# Patient Record
Sex: Female | Born: 1940 | Race: White | Hispanic: No | Smoking: Former smoker
Health system: Southern US, Community
[De-identification: ages and names within clinical notes are randomized; demographics above are authoritative.]

## PROBLEM LIST (undated history)

## (undated) DIAGNOSIS — Z860100 Personal history of colon polyps, unspecified: Secondary | ICD-10-CM

## (undated) DIAGNOSIS — N200 Calculus of kidney: Secondary | ICD-10-CM

## (undated) DIAGNOSIS — F329 Major depressive disorder, single episode, unspecified: Secondary | ICD-10-CM

## (undated) DIAGNOSIS — F32A Depression, unspecified: Secondary | ICD-10-CM

## (undated) DIAGNOSIS — G10 Huntington's disease: Secondary | ICD-10-CM

## (undated) DIAGNOSIS — M810 Age-related osteoporosis without current pathological fracture: Secondary | ICD-10-CM

## (undated) DIAGNOSIS — C50919 Malignant neoplasm of unspecified site of unspecified female breast: Secondary | ICD-10-CM

## (undated) DIAGNOSIS — Z8619 Personal history of other infectious and parasitic diseases: Secondary | ICD-10-CM

## (undated) HISTORY — DX: Personal history of colonic polyps: Z86.010

## (undated) HISTORY — DX: Calculus of kidney: N20.0

## (undated) HISTORY — DX: Huntington's disease: G10

## (undated) HISTORY — DX: Depression, unspecified: F32.A

## (undated) HISTORY — DX: Age-related osteoporosis without current pathological fracture: M81.0

## (undated) HISTORY — DX: Personal history of colon polyps, unspecified: Z86.0100

## (undated) HISTORY — DX: Malignant neoplasm of unspecified site of unspecified female breast: C50.919

## (undated) HISTORY — DX: Major depressive disorder, single episode, unspecified: F32.9

## (undated) HISTORY — DX: Personal history of other infectious and parasitic diseases: Z86.19

## (undated) HISTORY — PX: CHOLECYSTECTOMY: SHX55

---

## 2007-11-02 DIAGNOSIS — C50919 Malignant neoplasm of unspecified site of unspecified female breast: Secondary | ICD-10-CM

## 2007-11-02 HISTORY — DX: Malignant neoplasm of unspecified site of unspecified female breast: C50.919

## 2007-11-02 HISTORY — PX: TOTAL MASTECTOMY: SHX6129

## 2014-06-24 ENCOUNTER — Encounter: Payer: Self-pay | Admitting: Family Medicine

## 2018-07-10 LAB — HM DEXA SCAN

## 2018-11-27 LAB — LIPID PANEL
Cholesterol: 191 (ref 0–200)
HDL: 79 — AB (ref 35–70)
LDL Cholesterol: 108
LDl/HDL Ratio: 2.4
Triglycerides: 134 (ref 40–160)

## 2018-12-26 LAB — MICROALBUMIN, URINE: Microalb, Ur: 90.8

## 2019-03-05 ENCOUNTER — Encounter: Payer: Self-pay | Admitting: Family Medicine

## 2019-03-05 ENCOUNTER — Ambulatory Visit: Payer: Medicare Other

## 2019-03-05 ENCOUNTER — Ambulatory Visit (INDEPENDENT_AMBULATORY_CARE_PROVIDER_SITE_OTHER): Payer: Medicare Other | Admitting: Family Medicine

## 2019-03-05 ENCOUNTER — Ambulatory Visit: Payer: Self-pay | Admitting: Family Medicine

## 2019-03-05 VITALS — BP 118/90 | HR 71 | Temp 98.5°F | Ht 67.0 in | Wt 125.0 lb

## 2019-03-05 DIAGNOSIS — Z853 Personal history of malignant neoplasm of breast: Secondary | ICD-10-CM | POA: Insufficient documentation

## 2019-03-05 DIAGNOSIS — Z720 Tobacco use: Secondary | ICD-10-CM

## 2019-03-05 DIAGNOSIS — G10 Huntington's disease: Secondary | ICD-10-CM

## 2019-03-05 DIAGNOSIS — F325 Major depressive disorder, single episode, in full remission: Secondary | ICD-10-CM

## 2019-03-05 DIAGNOSIS — Z9013 Acquired absence of bilateral breasts and nipples: Secondary | ICD-10-CM | POA: Insufficient documentation

## 2019-03-05 DIAGNOSIS — I10 Essential (primary) hypertension: Secondary | ICD-10-CM | POA: Insufficient documentation

## 2019-03-05 MED ORDER — ESCITALOPRAM OXALATE 20 MG PO TABS: 20.0000 mg | ORAL_TABLET | Freq: Every day | ORAL | 3 refills | Status: DC

## 2019-03-05 NOTE — Assessment & Plan Note (Addendum)
Not interested in quitting today. Is interested in screening for lung cancer, was last done ~6 months ago

## 2019-03-05 NOTE — Assessment & Plan Note (Signed)
Stable per patient, though with some increase in anxiety symptoms. Has tried chair yoga and mindfulness in the past, will try doing this now. Declined therapy

## 2019-03-05 NOTE — Progress Notes (Signed)
I connected with [name] on 03/05/19 at  2:40 PM EDT by video and verified that I am speaking with the correct person using two identifiers.   I discussed the limitations, risks, security and privacy concerns of performing an evaluation and management service by video and the availability of in person appointments. I also discussed with the patient that there may be a patient responsible charge related to this service. The patient expressed understanding and agreed to proceed.  Patient location: [facility] Provider Location: [place] Participants: [name] and [name]   Subjective:     Maryse Brierley is a 78 y.o. female presenting for Establish Care (previous PCP Bindu Matthew-in [place]); Depression (needs Lexapro refilled); and 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previous neurologist in [place] was [employer])     HPI   #Depression - feels stable on the lexapro - some more anxiety since moving to [place] - current smoker - has never done counseling -   #Huntington's disease - saw a neurologist in [place] - trying to get an appoint in [facility] - some difficulty walking   #Breast Cancer - 2009 - screens once a year with breast US - due to b/l mastectomy  #Tobacco use - has quit in the past for up to 1 year - not interested in quitting now - has done lung cancer screening with low dose CT scan - would like to continue screening  Review of Systems  Constitutional: Negative for chills and fever.  Musculoskeletal: Positive for gait problem.  Neurological: Negative for weakness.  Psychiatric/Behavioral: The patient is nervous/anxious.      Social History   Tobacco Use  Smoking Status Current Every Day Smoker  . Packs/day: 0.75  . Years: 50.00  . Pack years: 37.50  . Types: Cigarettes  Smokeless Tobacco Never Used        Objective:   BP Readings from Last 3 Encounters:  03/05/19 118/90   Wt Readings from Last 3 Encounters:   03/05/19 125 lb (56.7 kg)   BP 118/90   Pulse 71   Temp 98.5 F (36.9 C)   Ht 5\' 7"  (1.702 m) Comment: per patient  Wt 125 lb (56.7 kg) Comment: per patient  SpO2 95%   BMI 19.58 kg/m    Physical Exam Constitutional:      Appearance: Normal appearance. She is not ill-appearing.  HENT:     Head: Normocephalic and atraumatic.     Right Ear: External ear normal.     Left Ear: External ear normal.     Nose: Nose normal.  Eyes:     Conjunctiva/sclera: Conjunctivae normal.  Cardiovascular:     Rate and Rhythm: Normal rate.  Pulmonary:     Effort: Pulmonary effort is normal. No respiratory distress.  Neurological:     Mental Status: She is alert.     Comments: Occasional involuntary lip movement  Psychiatric:        Mood and Affect: Mood normal.        Behavior: Behavior normal.        Thought Content: Thought content normal.        Judgment: Judgment normal.           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 Referral to neurology. Walking impacted. Has medication and overall feels she is  doing well.       Relevant Orders   Ambulatory referral to Neurology     Other   Tobacco use    Not interested in quitting today. Is interested in screening for lung cancer, was last done ~6 months ago      Depression, major, single episode, complete remission ([place])    Stable per patient, though with some increase in anxiety symptoms. Has tried chair yoga and mindfulness in the past, will try doing this now. Declined therapy      Relevant Medications   escitalopram (LEXAPRO) 20 MG tablet       Return in about 6 months (around 09/05/2019) for annual.  [name], MD

## 2019-03-05 NOTE — Assessment & Plan Note (Signed)
Late onset per patient. Referral to neurology. Walking impacted. Has medication and overall feels she is doing well.

## 2019-03-19 LAB — CBC AND DIFFERENTIAL
HCT: 43 (ref 36–46)
Hemoglobin: 14 (ref 12.0–16.0)
Platelets: 207 (ref 150–399)
WBC: 5.6

## 2019-03-19 LAB — BASIC METABOLIC PANEL
BUN: 20 (ref 4–21)
Creatinine: 1 (ref 0.5–1.1)
Glucose: 168
Potassium: 4.1 (ref 3.4–5.3)
Sodium: 141 (ref 137–147)

## 2019-03-19 LAB — HEPATIC FUNCTION PANEL
ALT: 9 (ref 7–35)
AST: 13 (ref 13–35)
Alkaline Phosphatase: 93 (ref 25–125)
Bilirubin, Total: 0.8

## 2019-03-19 LAB — VITAMIN B1: Vitamin B1 (Thiamine): 144.9

## 2019-03-19 LAB — VITAMIN B12: Vitamin B-12: 235

## 2019-03-27 ENCOUNTER — Other Ambulatory Visit: Payer: Self-pay

## 2019-03-27 MED ORDER — VITAMIN D (ERGOCALCIFEROL) 1.25 MG (50000 UNIT) PO CAPS: 50000.0000 [IU] | ORAL_CAPSULE | ORAL | 3 refills | Status: DC

## 2019-03-27 NOTE — Telephone Encounter (Signed)
Refill request came in from pharmacy on behalf of patient for Vitamin D RX. Dr. Einar Pheasant has not filled this medication before yet.

## 2019-04-02 ENCOUNTER — Encounter: Payer: Self-pay | Admitting: Family Medicine

## 2019-04-02 DIAGNOSIS — I712 Thoracic aortic aneurysm, without rupture, unspecified: Secondary | ICD-10-CM | POA: Insufficient documentation

## 2019-04-02 DIAGNOSIS — G9332 Myalgic encephalomyelitis/chronic fatigue syndrome: Secondary | ICD-10-CM | POA: Insufficient documentation

## 2019-04-02 DIAGNOSIS — R3129 Other microscopic hematuria: Secondary | ICD-10-CM | POA: Insufficient documentation

## 2019-04-02 DIAGNOSIS — E538 Deficiency of other specified B group vitamins: Secondary | ICD-10-CM | POA: Insufficient documentation

## 2019-04-02 DIAGNOSIS — R5382 Chronic fatigue, unspecified: Secondary | ICD-10-CM | POA: Insufficient documentation

## 2019-04-02 DIAGNOSIS — M810 Age-related osteoporosis without current pathological fracture: Secondary | ICD-10-CM | POA: Insufficient documentation

## 2019-06-11 ENCOUNTER — Encounter: Payer: Self-pay | Admitting: Internal Medicine

## 2019-06-11 ENCOUNTER — Other Ambulatory Visit: Payer: Self-pay

## 2019-06-11 ENCOUNTER — Ambulatory Visit (INDEPENDENT_AMBULATORY_CARE_PROVIDER_SITE_OTHER): Payer: Medicare Other | Admitting: Internal Medicine

## 2019-06-11 VITALS — BP 120/86 | HR 77 | Temp 98.2°F | Ht 67.0 in | Wt 116.0 lb

## 2019-06-11 DIAGNOSIS — R3129 Other microscopic hematuria: Secondary | ICD-10-CM

## 2019-06-11 DIAGNOSIS — N200 Calculus of kidney: Secondary | ICD-10-CM

## 2019-06-11 LAB — POC URINALSYSI DIPSTICK (AUTOMATED)
Bilirubin, UA: NEGATIVE
Glucose, UA: NEGATIVE
Ketones, UA: NEGATIVE
Leukocytes, UA: NEGATIVE
Nitrite, UA: NEGATIVE
Protein, UA: NEGATIVE
Spec Grav, UA: >=1.03 — AB (ref 1.010–1.025)
Urobilinogen, UA: 0.2 E.U./dL
pH, UA: 5 (ref 5.0–8.0)

## 2019-06-11 NOTE — Progress Notes (Signed)
Subjective:    Patient ID: Barbara Krause, female    DOB: 1941/08/08, 78 y.o.   MRN: [id number]  HPI  Pt presents to the clinic today requesting referral to urology. She reports she has a CT scan from 12/2018 that shows kidney stones. She does not know where the kidney stone is. She thinks she may have passed 1 kidney stone many years ago. She denies flank or abdominal pain, dysuria, blood in her urine or incomplete bladder emptying. She never had this further evaluated.   Review of Systems  Past Medical History:  Diagnosis Date  . Breast cancer ([place])   . Depression   . History of chicken pox   . History of colon polyps   . 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[place])   . Osteoporosis     Current Outpatient Medications  Medication Sig Dispense Refill  . AUSTEDO 12 MG TABS Take 1 tablet by mouth 2 (two) times a day.    . donepezil (ARICEPT) 5 MG tablet Take 1 tablet by mouth daily.    Marland Kitchen escitalopram (LEXAPRO) 20 MG tablet Take 1 tablet (20 mg total) by mouth daily. 90 tablet 3  . Vitamin D, Ergocalciferol, (DRISDOL) 1.25 MG (50000 UT) CAPS capsule Take 1 capsule (50,000 Units total) by mouth once a week. 4 capsule 3   No current facility-administered medications for this visit.     Allergies  Allergen Reactions  . Penicillin G Hives    Family History  Problem Relation Age of Onset  . Colon cancer Mother   . Stroke Father   . Heart disease Father   . Ovarian cancer Sister   . Healthy Daughter   . Stomach cancer Maternal Grandmother   . Diabetes Maternal Grandfather   . Colon cancer Paternal Grandmother   . Diabetes Paternal Grandfather   . Healthy Daughter     Social History   Socioeconomic History  . Marital status: Married    Spouse name: Fritz Pickerel  . Number of children: 2  . Years of education: Master's Degree in SW  . Highest education level: Not on file  Occupational History  . Not on file  Social Needs  . Financial resource strain: Not hard at all  . Food insecurity     Worry: Not on file    Inability: Not on file  . Transportation needs    Medical: Not on file    Non-medical: Not on file  Tobacco Use  . Smoking status: Current Every Day Smoker    Packs/day: 0.75    Years: 50.00    Pack years: 37.50    Types: Cigarettes  . Smokeless tobacco: Never Used  Substance and Sexual Activity  . Alcohol use: Yes    Comment: about 1 glass of wine x 2 a week  . Drug use: Never  . Sexual activity: Not Currently  Lifestyle  . Physical activity    Days per week: Not on file    Minutes per session: Not on file  . Stress: Not on file  Relationships  . Social Herbalist on phone: Not on file    Gets together: Not on file    Attends religious service: Not on file    Active member of club or organization: Not on file    Attends meetings of clubs or organizations: Not on file    Relationship status: Not on file  . Intimate partner violence    Fear of current or ex partner: Not on file  Emotionally abused: Not on file    Physically abused: Not on file    Forced sexual activity: Not on file  Other Topics Concern  . 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 from [place] to [place] to be near family   Enjoys: smoking, going to the movies and out to eat, seeing theater   Exercise: not currently   Diet: eats breakfast and dinner, but not much during the day     Constitutional: Denies fever, malaise, fatigue, headache or abrupt weight changes.  Respiratory: Denies difficulty breathing, shortness of breath, cough or sputum production.   Cardiovascular: Denies chest pain, chest tightness, palpitations or swelling in the hands or feet.  Gastrointestinal: Denies abdominal pain, bloating, constipation, diarrhea or blood in the stool.  GU: Pt reports microscopic blood in her urine. Denies urgency, frequency, pain with urination, burning sensation, blood in urine, odor or discharge.  No  other specific complaints in a complete review of systems (except as listed in HPI above).     Objective:   Physical Exam  BP 120/86   Pulse 77   Temp 98.2 F (36.8 C) (Temporal)   Ht 5\' 7"  (1.702 m)   Wt 116 lb (52.6 kg)   SpO2 97%   BMI 18.17 kg/m  Wt Readings from Last 3 Encounters:  06/11/19 116 lb (52.6 kg)  03/05/19 125 lb (56.7 kg)    General: Appears her stated age, well developed, well nourished in NAD. Abdomen: Soft and nontender. Normal bowel sounds. No distention or masses noted. Kidneys non palpable. No CVA tenderness.  Neurological: Alert and oriented.   BMET    Component Value Date/Time   NA 141 03/19/2019   K 4.1 03/19/2019   BUN 20 03/19/2019   CREATININE 1.0 03/19/2019    Lipid Panel     Component Value Date/Time   CHOL 191 11/27/2018   TRIG 134 11/27/2018   HDL 79 (A) 11/27/2018   LDLCALC 108 11/27/2018    CBC    Component Value Date/Time   WBC 5.6 03/19/2019   HGB 14.0 03/19/2019   HCT 43 03/19/2019   PLT 207 03/19/2019    Hgb A1C No results found for: HGBA1C          Assessment & Plan:   Kidney Stones:  Unable to review CT as this was on disc Advised her if this was just asymptomatic stones, they may not intervene but monitor instead Urinalysis: trace microscopic hematuria Referral to urology placed Encouraged adequate water intake  Return precautions discussed

## 2019-06-11 NOTE — Patient Instructions (Signed)

## 2019-06-19 ENCOUNTER — Other Ambulatory Visit: Payer: Self-pay

## 2019-06-19 NOTE — Telephone Encounter (Signed)
Sent to Dr. Shah

## 2019-06-19 NOTE — Telephone Encounter (Signed)
Dr Einar Pheasant has not refilled this medication before. OK to refill?

## 2019-06-19 NOTE — Telephone Encounter (Signed)
It appears that she has established care with [facility] neurology, can we send the refill request to their office?

## 2019-07-04 ENCOUNTER — Encounter: Payer: Self-pay | Admitting: Urology

## 2019-07-04 ENCOUNTER — Ambulatory Visit
Admission: RE | Admit: 2019-07-04 | Discharge: 2019-07-04 | Disposition: A | Discharge status: Home / Self Care | Payer: Medicare Other | Source: Ambulatory Visit | Attending: Urology | Admitting: Urology

## 2019-07-04 ENCOUNTER — Other Ambulatory Visit: Payer: Self-pay

## 2019-07-04 ENCOUNTER — Ambulatory Visit (INDEPENDENT_AMBULATORY_CARE_PROVIDER_SITE_OTHER): Payer: Medicare Other | Admitting: Urology

## 2019-07-04 VITALS — BP 164/95 | HR 76 | Ht 67.0 in | Wt 117.0 lb

## 2019-07-04 DIAGNOSIS — N200 Calculus of kidney: Secondary | ICD-10-CM | POA: Diagnosis present

## 2019-07-04 DIAGNOSIS — R3129 Other microscopic hematuria: Secondary | ICD-10-CM | POA: Diagnosis not present

## 2019-07-04 DIAGNOSIS — F17200 Nicotine dependence, unspecified, uncomplicated: Secondary | ICD-10-CM | POA: Diagnosis not present

## 2019-07-04 LAB — URINALYSIS, COMPLETE
Bilirubin, UA: NEGATIVE
Glucose, UA: NEGATIVE
Ketones, UA: NEGATIVE
Leukocytes,UA: NEGATIVE
Nitrite, UA: NEGATIVE
Protein,UA: NEGATIVE
RBC, UA: NEGATIVE
Specific Gravity, UA: 1.025 (ref 1.005–1.030)
Urobilinogen, Ur: 0.2 mg/dL (ref 0.2–1.0)
pH, UA: 5 (ref 5.0–7.5)

## 2019-07-04 LAB — MICROSCOPIC EXAMINATION
Bacteria, UA: NONE SEEN
RBC, Urine: NONE SEEN /hpf (ref 0–2)

## 2019-07-04 NOTE — Progress Notes (Signed)
07/04/2019 3:39 PM   Barbara Krause [birth year] [id number]  Referring provider: [name], MD [place],  [place] [address]  Chief Complaint  Patient presents with  . Nephrolithiasis    New Patient    HPI: 78 year old female referred for further evaluation of hematuria and kidney stones.  She was previously seen and evaluated at [facility] in March by the urologist, Frederich Balding.  She started a work-up for microscopic hematuria at that time but however secondary to [place] and moving to [place], she never completed the work-up.  She does have a significant smoking history, smokes 1 pack a day for 45 years and continues to smoke.  She did undergo CT urogram in [place] in 12/2018.  She brings a disc with her today which I was able to review.  There is no radiology report associated with this however upon my interpretation, she has a right lower pole renal cyst which is simple in nature.  She is a large 1.5 cm nonobstructing lower pole stone without hydroureteronephrosis.  On delayed imaging, she has no filling defects.  Her ureters are incompletely opacified as well as her bladder.  No other pathology is identified on my interpretation.  She denies a personal history of kidney stones.  No history of flank pain.  No UTIs.  She does have multiple medical comorbidities including a personal history of breast cancer as well as recent diagnosis of Huntington's.  She has gait instability related to this.   PMH: Past Medical History:  Diagnosis Date  . Breast cancer ([place])   . Depression   . History of chicken pox   . History of colon polyps   . 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[place])   . Osteoporosis     Surgical History: Past Surgical History:  Procedure Laterality Date  . [place]  . CHOLECYSTECTOMY    . TOTAL MASTECTOMY Bilateral 2009    Home Medications:  Allergies as of 07/04/2019      Reactions   Penicillin G Hives       Medication List       Accurate as of July 04, 2019  3:39 PM. If you have any questions, ask your nurse or doctor.        Austedo 12 MG Tabs Generic drug: Deutetrabenazine Take 1 tablet by mouth 2 (two) times a day.   donepezil 5 MG tablet Commonly known as: ARICEPT Take 1 tablet by mouth daily.   escitalopram 20 MG tablet Commonly known as: LEXAPRO Take 1 tablet (20 mg total) by mouth daily.   Vitamin D (Ergocalciferol) 1.25 MG (50000 UT) Caps capsule Commonly known as: DRISDOL Take 1 capsule (50,000 Units total) by mouth once a week.       Allergies:  Allergies  Allergen Reactions  . Penicillin G Hives    Family History: Family History  Problem Relation Age of Onset  . Colon cancer Mother   . Stroke Father   . Heart disease Father   . Ovarian cancer Sister   . Healthy Daughter   . Stomach cancer Maternal Grandmother   . Diabetes Maternal Grandfather   . Colon cancer Paternal Grandmother   . Diabetes Paternal Grandfather   . Healthy Daughter     Social History:  reports that she has been smoking cigarettes. She has a 37.50 pack-year smoking history. She has never used smokeless tobacco. She reports current alcohol use. She reports that she does  not use drugs.  ROS: UROLOGY Frequent Urination?: No Hard to postpone urination?: No Burning/pain with urination?: No Get up at night to urinate?: No Leakage of urine?: No Urine stream starts and stops?: No Trouble starting stream?: No Do you have to strain to urinate?: No Blood in urine?: Yes Urinary tract infection?: No Sexually transmitted disease?: No Injury to kidneys or bladder?: No Painful intercourse?: No Weak stream?: No Currently pregnant?: No Vaginal bleeding?: No Last menstrual period?: N  Gastrointestinal Nausea?: No Vomiting?: No Indigestion/heartburn?: No Diarrhea?: No Constipation?: No  Constitutional Fever: No Night sweats?: No Weight loss?: No Fatigue?: No  Skin Skin  rash/lesions?: No Itching?: No  Eyes Blurred vision?: No Double vision?: No  Ears/Nose/Throat Sore throat?: No Sinus problems?: No  Hematologic/Lymphatic Swollen glands?: No Easy bruising?: No  Cardiovascular Leg swelling?: No Chest pain?: No  Respiratory Cough?: No Shortness of breath?: No  Endocrine Excessive thirst?: No  Musculoskeletal Back pain?: No Joint pain?: No  Neurological Headaches?: No Dizziness?: No  Psychologic Depression?: No Anxiety?: No  Physical Exam: BP (!) 164/95   Pulse 76   Ht 5\' 7"  (1.702 m)   Wt 117 lb (53.1 kg)   BMI 18.32 kg/m   Constitutional:  Alert and oriented, No acute distress. HEENT: [place] AT, moist mucus membranes.  Trachea midline, no masses. Cardiovascular: No clubbing, cyanosis, or edema. Respiratory: Normal respiratory effort, no increased work of breathing. GI: Abdomen is soft, nontender, nondistended, no abdominal masses GU: No CVA tenderness Skin: No rashes, bruises or suspicious lesions. Neurologic: Gait abnormality appreciated Psychiatric: Normal mood and affect.  Laboratory Data: Lab Results  Component Value Date   WBC 5.6 03/19/2019   HGB 14.0 03/19/2019   HCT 43 03/19/2019   PLT 207 03/19/2019    Lab Results  Component Value Date   CREATININE 1.0 03/19/2019    Urinalysis UA today is negative  Pertinent Imaging: CT scan from [place] personally reviewed today on disc, interpretation by me is as above.  Will request records for official radiology read.  Assessment & Plan:    1. Kidney stone Large asymptomatic 1.6 cm right lower pole stone.  Unclear chronicity of the stone without sequela.  I recommend a KUB today for baseline.  We will likely continue to follow this conservatively over time given her lack of symptoms and medical comorbidities.  She is agreeable this plan.   - Urinalysis, Complete - Abdomen 1 view (KUB); Future  2. Microscopic hematuria Given her personal history of  smoking, she is high risk for bladder pathology.  As such, I highly recommended that she undergo office cystoscopy to which she is agreeable.  We will also have her sign a release when she returns to get the official radiologic interpretation of her CT urogram.  Per my interpretation, she has no upper tract filling defects or pathology.  3. Smoker Encourage smoking cessation, not ready to quit.  Risk factor for bladder cancer.  KUB today, schedule cystoscopy.  She will need to sign a records release next time she is here for urogram report.  Hollice Espy, MD  [facility] [address] [place], [place] [address] [phone]

## 2019-07-12 ENCOUNTER — Other Ambulatory Visit: Payer: Self-pay

## 2019-07-12 ENCOUNTER — Ambulatory Visit (INDEPENDENT_AMBULATORY_CARE_PROVIDER_SITE_OTHER): Payer: Medicare Other | Admitting: Physician Assistant

## 2019-07-12 ENCOUNTER — Encounter: Payer: Self-pay | Admitting: Physician Assistant

## 2019-07-12 VITALS — BP 156/96 | HR 69 | Ht 67.0 in

## 2019-07-12 DIAGNOSIS — R300 Dysuria: Secondary | ICD-10-CM | POA: Diagnosis not present

## 2019-07-12 DIAGNOSIS — N952 Postmenopausal atrophic vaginitis: Secondary | ICD-10-CM

## 2019-07-12 NOTE — Progress Notes (Signed)
07/12/2019 2:35 PM   [name] [birth year] [id number]  CC: Burning with urination, slight discharge  HPI: Barbara Krause is a 78 y.o. female who presents today for evaluation of possible UTI. She is an established [facility] patient who last saw Dr. Erlene Quan on 07/04/2019 to establish care for a history of nephrolithiasis and microscopic hematuria.  She reports a 2-day history of vulvar burning with urination and slight vaginal discharge. She describes the vaginal discharge as typical of her normal discharge, but increased in volume. She denies dysuria, frequency, urgency, gross hematuria, vaginal odor, and vaginal or vulvar pruritis.  She does not have a history of recurrent UTI. She does have a personal history of breast cancer, s/p bilateral total mastectomy in 2009. She is not sexually active.  In-office UA today positive for trace-intact blood and 1+ protein; urine microscopy pan-negative.  PMH: Past Medical History:  Diagnosis Date  . Breast cancer ([place])   . Depression   . History of chicken pox   . History of colon polyps   . 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[place])   . Osteoporosis     Surgical History: Past Surgical History:  Procedure Laterality Date  . [place]  . CHOLECYSTECTOMY    . TOTAL MASTECTOMY Bilateral 2009    Home Medications:  Allergies as of 07/12/2019      Reactions   Penicillin G Hives      Medication List       Accurate as of July 12, 2019 11:59 PM. If you have any questions, ask your nurse or doctor.        Austedo 12 MG Tabs Generic drug: Deutetrabenazine Take 1 tablet by mouth 2 (two) times a day.   donepezil 5 MG tablet Commonly known as: ARICEPT Take 1 tablet by mouth daily.   escitalopram 20 MG tablet Commonly known as: LEXAPRO Take 1 tablet (20 mg total) by mouth daily.   Vitamin D (Ergocalciferol) 1.25 MG (50000 UT) Caps capsule Commonly known as: DRISDOL Take 1 capsule (50,000 Units total) by mouth once a  week.       Allergies:  Allergies  Allergen Reactions  . Penicillin G Hives    Family History: Family History  Problem Relation Age of Onset  . Colon cancer Mother   . Stroke Father   . Heart disease Father   . Ovarian cancer Sister   . Healthy Daughter   . Stomach cancer Maternal Grandmother   . Diabetes Maternal Grandfather   . Colon cancer Paternal Grandmother   . Diabetes Paternal Grandfather   . Healthy Daughter     Social History:   reports that she has been smoking cigarettes. She has a 37.50 pack-year smoking history. She has never used smokeless tobacco. She reports current alcohol use. She reports that she does not use drugs.  ROS: UROLOGY Frequent Urination?: No Hard to postpone urination?: No Burning/pain with urination?: No Get up at night to urinate?: No Leakage of urine?: No Urine stream starts and stops?: No Trouble starting stream?: No Do you have to strain to urinate?: No Blood in urine?: No Urinary tract infection?: Yes Sexually transmitted disease?: No Injury to kidneys or bladder?: No Painful intercourse?: No Weak stream?: No Currently pregnant?: No Vaginal bleeding?: No Last menstrual period?: n  Gastrointestinal Nausea?: No Vomiting?: No Indigestion/heartburn?: No Diarrhea?: No Constipation?: No  Constitutional Fever: No Night sweats?: Yes Weight loss?: No Fatigue?: No  Skin Skin rash/lesions?: No Itching?: No  Eyes  Blurred vision?: No Double vision?: No  Ears/Nose/Throat Sore throat?: No Sinus problems?: No  Hematologic/Lymphatic Swollen glands?: No Easy bruising?: No  Cardiovascular Leg swelling?: No Chest pain?: No  Respiratory Cough?: No Shortness of breath?: No  Endocrine Excessive thirst?: No  Musculoskeletal Back pain?: No Joint pain?: No  Neurological Headaches?: No Dizziness?: No  Psychologic Depression?: No Anxiety?: No  Physical Exam: BP (!) 156/96 (BP Location: Left Arm, Patient  Position: Sitting, Cuff Size: Normal)   Pulse 69   Ht 5\' 7"  (1.702 m)   BMI 18.32 kg/m   Constitutional:  Alert and oriented, no acute distress, nontoxic appearing HEENT: [place], AT Cardiovascular: No clubbing, cyanosis, or edema Respiratory: Normal respiratory effort, no increased work of breathing GU: Atrophic vulva with dry vaginal introitus, no abnormalities of the vaginal walls Skin: No rashes, bruises or suspicious lesions Neurologic: [place] Psychiatric: Normal mood and affect  Laboratory Data: Results for orders placed or performed in visit on 07/12/19  Microscopic Examination   URINE  Result Value Ref Range   WBC, UA 0-5 0 - 5 /hpf   RBC 0-2 0 - 2 /hpf   Epithelial Cells (non renal) 0-10 0 - 10 /hpf   Mucus, UA Present (A) Not Estab.   Bacteria, UA Few None seen/Few  Urinalysis, Complete  Result Value Ref Range   Specific Gravity, UA >1.030 (H) 1.005 - 1.030   pH, UA 5.5 5.0 - 7.5   Color, UA Yellow Yellow   Appearance Ur Hazy (A) Clear   Leukocytes,UA Negative Negative   Protein,UA 1+ (A) Negative/Trace   Glucose, UA Negative Negative   Ketones, UA Negative Negative   RBC, UA Trace (A) Negative   Bilirubin, UA Negative Negative   Urobilinogen, Ur 0.2 0.2 - 1.0 mg/dL   Nitrite, UA Negative Negative   Microscopic Examination See below:    Assessment & Plan:   1. Dysuria Upon questioning, symptom is more consistent with vulvar burning than true irritative dysuria. UA unconcerning for infection, will not send for culture. Patient denies sexual activity and no concerning vaginal discharge noted on today's exam; will defer STI testing as well.  Of note, patient is scheduled for cystoscopy with Dr. Erlene Quan later this month for evaluation of microscopic hematuria; no microscopic hematuria on UA today. - Urinalysis, Complete  2. Atrophic vaginitis Atrophic vulvar tissue on physical exam today. Patient with personal history of breast cancer. Will not prescribe topical  estrogens at this time. I counseled her to utilize OTC vaginal moisturizers for relief of her vulvar burning. She expressed an understanding of this plan. - Begin treatment with OTC vaginal moisturizers as needed  Debroah Loop, PA-C  [facility] [address] [place],  [address] [phone]

## 2019-07-12 NOTE — Patient Instructions (Signed)
Atrophic Vaginitis  Atrophic vaginitis is a condition in which the tissues that line the vagina become dry and thin. This condition is most common in women who have stopped having regular menstrual periods (are in menopause). This usually starts when a woman is 45-76 years old. That is the time when a woman's estrogen levels begin to drop (decrease). Estrogen is a female hormone. It helps to keep the tissues of the vagina moist. It stimulates the vagina to produce a clear fluid that lubricates the vagina for sexual intercourse. This fluid also protects the vagina from infection. Lack of estrogen can cause the lining of the vagina to get thinner and dryer. The vagina may also shrink in size. It may become less elastic. Atrophic vaginitis tends to get worse over time as a woman's estrogen level drops. What are the causes? This condition is caused by the normal drop in estrogen that happens around the time of menopause. What increases the risk? Certain conditions or situations may lower a woman's estrogen level, leading to a higher risk for atrophic vaginitis. You are more likely to develop this condition if:  You are taking medicines that block estrogen.  You have had your ovaries removed.  You are being treated for cancer with X-ray (radiation) or medicines (chemotherapy).  You have given birth or are breastfeeding.  You are older than age 50.  You smoke. What are the signs or symptoms? Symptoms of this condition include:  Pain, soreness, or bleeding during sexual intercourse (dyspareunia).  Vaginal burning, irritation, or itching.  Pain or bleeding when a speculum is used in a vaginal exam (pelvic exam).  Having burning pain when passing urine.  Vaginal discharge that is brown or yellow. In some cases, there are no symptoms. How is this diagnosed? This condition is diagnosed by taking a medical history and doing a physical exam. This will include a pelvic exam that checks the  vaginal tissues. Though rare, you may also have other tests, including:  A urine test.  A test that checks the acid balance in your vagina (acid balance test). How is this treated? Treatment for this condition depends on how severe your symptoms are. Treatment may include:  Using an over-the-counter vaginal lubricant before sex.  Using a long-acting vaginal moisturizer.  Using low-dose vaginal estrogen for moderate to severe symptoms that do not respond to other treatments. Options include creams, tablets, and inserts (vaginal rings). Before you use a vaginal estrogen, tell your health care provider if you have a history of: ? Breast cancer. ? Endometrial cancer. ? Blood clots. If you are not sexually active and your symptoms are very mild, you may not need treatment. Follow these instructions at home: Medicines  Take over-the-counter and prescription medicines only as told by your health care provider. Do not use herbal or alternative medicines unless your health care provider says that you can.  Use over-the-counter creams, lubricants, or moisturizers for dryness only as directed by your health care provider. General instructions  If your atrophic vaginitis is caused by menopause, discuss all of your menopause symptoms and treatment options with your health care provider.  Do not douche.  Do not use products that can make your vagina dry. These include: ? Scented feminine sprays. ? Scented tampons. ? Scented soaps.  Vaginal intercourse can help to improve blood flow and elasticity of vaginal tissue. If it hurts to have sex, try using a lubricant or moisturizer just before having intercourse. Contact a health care provider if:    Your discharge looks different than normal.  Your vagina has an unusual smell.  You have new symptoms.  Your symptoms do not improve with treatment.  Your symptoms get worse. Summary  Atrophic vaginitis is a condition in which the tissues that  line the vagina become dry and thin. It is most common in women who have stopped having regular menstrual periods (are in menopause).  Treatment options include using vaginal lubricants and low-dose vaginal estrogen.  Contact a health care provider if your vagina has an unusual smell, or if your symptoms get worse or do not improve after treatment. This information is not intended to replace advice given to you by your health care provider. Make sure you discuss any questions you have with your health care provider. Document Released: 03/04/2015 Document Revised: 09/30/2017 Document Reviewed: 07/14/2017 Elsevier Patient Education  2020 Elsevier Inc.  

## 2019-07-13 LAB — MICROSCOPIC EXAMINATION

## 2019-07-13 LAB — URINALYSIS, COMPLETE
Bilirubin, UA: NEGATIVE
Glucose, UA: NEGATIVE
Ketones, UA: NEGATIVE
Leukocytes,UA: NEGATIVE
Nitrite, UA: NEGATIVE
Specific Gravity, UA: >1.03 — ABNORMAL HIGH (ref 1.005–1.030)
Urobilinogen, Ur: 0.2 mg/dL (ref 0.2–1.0)
pH, UA: 5.5 (ref 5.0–7.5)

## 2019-07-22 ENCOUNTER — Other Ambulatory Visit: Payer: Self-pay | Admitting: Family Medicine

## 2019-07-24 ENCOUNTER — Other Ambulatory Visit: Payer: Self-pay

## 2019-07-24 ENCOUNTER — Ambulatory Visit (INDEPENDENT_AMBULATORY_CARE_PROVIDER_SITE_OTHER): Payer: Medicare Other | Admitting: Urology

## 2019-07-24 ENCOUNTER — Encounter: Payer: Self-pay | Admitting: Urology

## 2019-07-24 VITALS — BP 152/93 | HR 68 | Ht 67.0 in | Wt 117.0 lb

## 2019-07-24 DIAGNOSIS — R3129 Other microscopic hematuria: Secondary | ICD-10-CM

## 2019-07-24 DIAGNOSIS — N200 Calculus of kidney: Secondary | ICD-10-CM

## 2019-07-24 DIAGNOSIS — N952 Postmenopausal atrophic vaginitis: Secondary | ICD-10-CM

## 2019-07-24 LAB — URINALYSIS, COMPLETE
Bilirubin, UA: NEGATIVE
Glucose, UA: NEGATIVE
Ketones, UA: NEGATIVE
Leukocytes,UA: NEGATIVE
Nitrite, UA: NEGATIVE
Protein,UA: NEGATIVE
RBC, UA: NEGATIVE
Specific Gravity, UA: 1.02 (ref 1.005–1.030)
Urobilinogen, Ur: 0.2 mg/dL (ref 0.2–1.0)
pH, UA: 5.5 (ref 5.0–7.5)

## 2019-07-24 LAB — MICROSCOPIC EXAMINATION: Bacteria, UA: NONE SEEN

## 2019-07-24 NOTE — Progress Notes (Signed)
   07/28/19  CC:  Chief Complaint  Patient presents with  . Cysto    HPI: 78 year old smoker with an episode of microscopic hematuria for cystoscopic evaluation.  She underwent CT urogram 12/2018.  She also has a known right nonobstructing right lower pole stone measuring 2.2 cm asymptomatic.  This is seen on urogram and followed up with KUB here.  She was also seen and evaluated dysuria found to have no evidence of infection but significant atrophic vulva/vaginitis.  She has been using vaginal moisturizers with significant relief of her discomfort.  Blood pressure (!) 152/93, pulse 68, height 5\' 7"  (1.702 m), weight 117 lb (53.1 kg). NED. A&Ox3.   No respiratory distress   Abd soft, NT, ND Normal external genitalia with patent urethral meatus  Cystoscopy Procedure Note  Patient identification was confirmed, informed consent was obtained, and patient was prepped using Betadine solution.  Lidocaine jelly was administered per urethral meatus.    Procedure: - Flexible cystoscope introduced, without any difficulty.   - Thorough search of the bladder revealed:    normal urethral meatus    normal urothelium    no stones    no ulcers     no tumors    no urethral polyps    no trabeculation  - Ureteral orifices were normal in position and appearance.  Post-Procedure: - Patient tolerated the procedure well  Assessment/ Plan:  1. Microscopic hematuria Diagnosed negative cystoscopy today Microscopic blood likely from vaginal atrophy and or stone We will continue to follow given that she is high risk given her ongoing smoking, encourage smoking cessation - Urinalysis, Complete - Abdomen 1 view (KUB); Future  2. Kidney stone Large right lower pole stone measuring 2.2 cm She is asymptomatic and has multiple medical issues including recently diagnosed Huntington's As such, she like to follow the stone We will plan for KUB in 6 months to assess for interval growth  3. Atrophic  vaginitis Doing well with vaginal moisturizers     Return in about 6 months (around 01/21/2020) for KUB.  Hollice Espy, MD

## 2019-08-07 ENCOUNTER — Telehealth: Payer: Self-pay | Admitting: Family Medicine

## 2019-08-07 NOTE — Telephone Encounter (Signed)
Patient called asking to be referred for CT screening for lung cancer.  Patient had it done yearly in [place]. Patient prefers [facility].  Patient can go anytime.

## 2019-08-08 ENCOUNTER — Telehealth: Payer: Self-pay | Admitting: *Deleted

## 2019-08-08 ENCOUNTER — Other Ambulatory Visit: Payer: Self-pay | Admitting: Family Medicine

## 2019-08-08 DIAGNOSIS — F17210 Nicotine dependence, cigarettes, uncomplicated: Secondary | ICD-10-CM

## 2019-08-08 NOTE — Telephone Encounter (Signed)
Received referral for low dose lung cancer screening CT scan. Message left at phone number listed in EMR for patient to call me back to facilitate scheduling scan.  

## 2019-08-08 NOTE — Progress Notes (Signed)
In absence of PCP, order placed to lung cancer screening per patient request.

## 2019-08-08 NOTE — Telephone Encounter (Signed)
Please call patient and let her know that I have put in the referral for the lung cancer screening clinic in [place].

## 2019-08-09 ENCOUNTER — Telehealth: Payer: Self-pay | Admitting: *Deleted

## 2019-08-09 DIAGNOSIS — Z122 Encounter for screening for malignant neoplasm of respiratory organs: Secondary | ICD-10-CM

## 2019-08-09 DIAGNOSIS — Z87891 Personal history of nicotine dependence: Secondary | ICD-10-CM

## 2019-08-09 NOTE — Telephone Encounter (Signed)
Received referral for initial lung cancer screening scan. Contacted patient and obtained smoking history,(current, 37.5 pack year) as well as answering questions related to screening process. Patient denies signs of lung cancer such as weight loss or hemoptysis. Patient denies comorbidity that would prevent curative treatment if lung cancer were found. Patient is scheduled for shared decision making visit and CT scan on 08/23/19 at 2pm.

## 2019-08-10 NOTE — Telephone Encounter (Signed)
Pt aware via detailed voicemail. Nothing further needed.

## 2019-08-23 ENCOUNTER — Other Ambulatory Visit: Payer: Self-pay

## 2019-08-23 ENCOUNTER — Ambulatory Visit
Admission: RE | Admit: 2019-08-23 | Discharge: 2019-08-23 | Disposition: A | Discharge status: Home / Self Care | Payer: Medicare Other | Source: Ambulatory Visit | Attending: Nurse Practitioner | Admitting: Nurse Practitioner

## 2019-08-23 ENCOUNTER — Inpatient Hospital Stay: Payer: Medicare Other | Attending: Nurse Practitioner | Admitting: Nurse Practitioner

## 2019-08-23 DIAGNOSIS — Z122 Encounter for screening for malignant neoplasm of respiratory organs: Secondary | ICD-10-CM | POA: Diagnosis not present

## 2019-08-23 DIAGNOSIS — Z87891 Personal history of nicotine dependence: Secondary | ICD-10-CM | POA: Diagnosis present

## 2019-08-23 DIAGNOSIS — F17210 Nicotine dependence, cigarettes, uncomplicated: Secondary | ICD-10-CM | POA: Diagnosis not present

## 2019-08-23 NOTE — Progress Notes (Signed)
Virtual Visit via Video Enabled Telemedicine Note   I connected with Barbara Krause on 08/23/19 at 2:00 PM EST by video enabled telemedicine visit and verified that I am speaking with the correct person using two identifiers.   I discussed the limitations, risks, security and privacy concerns of performing an evaluation and management service by telemedicine and the availability of in-person appointments. I also discussed with the patient that there may be a patient responsible charge related to this service. The patient expressed understanding and agreed to proceed.   Other persons participating in the visit and their role in the encounter: Burgess Estelle, RN- checking in patient & navigation  Patient's location: [place]  Provider's location: Clinic  Chief Complaint: Low Dose CT Screening  Patient agreed to evaluation by telemedicine to discuss shared decision making for consideration of low dose CT lung cancer screening.    In accordance with CMS guidelines, patient has met eligibility criteria including age, absence of signs or symptoms of lung cancer.  Social History   Tobacco Use  . Smoking status: Current Every Day Smoker    Packs/day: 0.75    Years: 50.00    Pack years: 37.50    Types: Cigarettes  . Smokeless tobacco: Never Used  Substance Use Topics  . Alcohol use: Yes    Comment: about 1 glass of wine x 2 a week     A shared decision-making session was conducted prior to the performance of CT scan. This includes one or more decision aids, includes benefits and harms of screening, follow-up diagnostic testing, over-diagnosis, false positive rate, and total radiation exposure.   Counseling on the importance of adherence to annual lung cancer LDCT screening, impact of co-morbidities, and ability or willingness to undergo diagnosis and treatment is imperative for compliance of the program.   Counseling on the importance of continued smoking cessation for former smokers;  the importance of smoking cessation for current smokers, and information about tobacco cessation interventions have been given to patient including [place] and [address] [employer] [place] programs.   Written order for lung cancer screening with LDCT has been given to the patient and any and all questions have been answered to the best of my abilities.    Yearly follow up will be coordinated by Burgess Estelle, Thoracic Navigator.  I discussed the assessment and treatment plan with the patient. The patient was provided an opportunity to ask questions and all were answered. The patient agreed with the plan and demonstrated an understanding of the instructions.   The patient was advised to call back or seek an in-person evaluation if the symptoms worsen or if the condition fails to improve as anticipated.   I provided 15 minutes of face-to-face video visit time during this encounter, and > 50% was spent counseling as documented under my assessment & plan.   Beckey Rutter, DNP, AGNP-C [place] [facility] [phone] (work cell) [phone] (office)

## 2019-08-24 ENCOUNTER — Telehealth: Payer: Self-pay | Admitting: *Deleted

## 2019-08-24 NOTE — Telephone Encounter (Signed)
Pt given results of recent LDCT scan. Informed that will continue annual screening with low-dose chest CT without contrast in 12 months. Pt verbalized understanding.

## 2019-08-30 ENCOUNTER — Encounter: Payer: Self-pay | Admitting: Family Medicine

## 2019-08-30 DIAGNOSIS — J439 Emphysema, unspecified: Secondary | ICD-10-CM | POA: Insufficient documentation

## 2019-08-30 DIAGNOSIS — I700 Atherosclerosis of aorta: Secondary | ICD-10-CM | POA: Insufficient documentation

## 2019-08-31 ENCOUNTER — Other Ambulatory Visit: Payer: Self-pay | Admitting: Neurology

## 2019-08-31 DIAGNOSIS — R1319 Other dysphagia: Secondary | ICD-10-CM

## 2019-08-31 DIAGNOSIS — G10 Huntington's disease: Secondary | ICD-10-CM

## 2019-09-03 ENCOUNTER — Other Ambulatory Visit: Payer: Self-pay | Admitting: Neurology

## 2019-09-03 DIAGNOSIS — R1312 Dysphagia, oropharyngeal phase: Secondary | ICD-10-CM

## 2019-09-11 ENCOUNTER — Ambulatory Visit: Payer: Medicare Other | Admitting: Family Medicine

## 2019-09-17 ENCOUNTER — Other Ambulatory Visit: Payer: Self-pay

## 2019-09-17 ENCOUNTER — Encounter: Payer: Self-pay | Admitting: Family Medicine

## 2019-09-17 ENCOUNTER — Ambulatory Visit (INDEPENDENT_AMBULATORY_CARE_PROVIDER_SITE_OTHER): Payer: Medicare Other | Admitting: Family Medicine

## 2019-09-17 VITALS — BP 126/84 | HR 69 | Temp 98.9°F | Ht 66.5 in | Wt 120.2 lb

## 2019-09-17 DIAGNOSIS — G10 Huntington's disease: Secondary | ICD-10-CM | POA: Diagnosis not present

## 2019-09-17 DIAGNOSIS — Z9013 Acquired absence of bilateral breasts and nipples: Secondary | ICD-10-CM

## 2019-09-17 DIAGNOSIS — I700 Atherosclerosis of aorta: Secondary | ICD-10-CM | POA: Diagnosis not present

## 2019-09-17 DIAGNOSIS — M810 Age-related osteoporosis without current pathological fracture: Secondary | ICD-10-CM

## 2019-09-17 DIAGNOSIS — E559 Vitamin D deficiency, unspecified: Secondary | ICD-10-CM

## 2019-09-17 DIAGNOSIS — I10 Essential (primary) hypertension: Secondary | ICD-10-CM

## 2019-09-17 DIAGNOSIS — Z720 Tobacco use: Secondary | ICD-10-CM

## 2019-09-17 DIAGNOSIS — Z853 Personal history of malignant neoplasm of breast: Secondary | ICD-10-CM

## 2019-09-17 MED ORDER — VITAMIN D (ERGOCALCIFEROL) 1.25 MG (50000 UNIT) PO CAPS: 50000.0000 [IU] | ORAL_CAPSULE | ORAL | 3 refills | Status: DC

## 2019-09-17 MED ORDER — ASPIRIN EC 81 MG PO TBEC: 81.0000 mg | DELAYED_RELEASE_TABLET | Freq: Every day | ORAL | 3 refills | Status: DC

## 2019-09-17 MED ORDER — ATORVASTATIN CALCIUM 10 MG PO TABS: 10.0000 mg | ORAL_TABLET | Freq: Every day | ORAL | 3 refills | Status: DC

## 2019-09-17 NOTE — Assessment & Plan Note (Signed)
Pt was on hormone therapy in [place], but completed. Will repeat DEXA to reassess. Consider endocrine referral pending results.

## 2019-09-17 NOTE — Assessment & Plan Note (Signed)
Not on medication. Controlled.

## 2019-09-17 NOTE — Assessment & Plan Note (Signed)
Screening ordered

## 2019-09-17 NOTE — Assessment & Plan Note (Signed)
Discussed starting Statin and ASA to lower risk of MI/Stroke and patient was agreeable. Not interested in quitting smoking at this time.

## 2019-09-17 NOTE — Assessment & Plan Note (Signed)
Follows with specialist. Feels she is stable with her disease and the medication is helping

## 2019-09-17 NOTE — Progress Notes (Signed)
Subjective:     Barbara Krause is a 78 y.o. female presenting for Follow-up (6 months follow up)     HPI   #HTN - stable - no symptoms  #huntington's disease - feels stable - taking medication - sister now diagnosed - her niece is also getting  #Breast cancer hx - s/p mastectomy and implants - due for [place] screening  #osteoporosis  - was on treatment but completed course   Review of Systems  Constitutional: Negative for chills and fever.  Neurological: Positive for tremors.     Social History   Tobacco Use  Smoking Status Current Every Day Smoker  . Packs/day: 0.75  . Years: 50.00  . Pack years: 37.50  . Types: Cigarettes  Smokeless Tobacco Never Used        Objective:    BP Readings from Last 3 Encounters:  09/17/19 126/84  07/24/19 (!) 152/93  07/12/19 (!) 156/96   Wt Readings from Last 3 Encounters:  09/17/19 120 lb 4 oz (54.5 kg)  08/23/19 123 lb (55.8 kg)  07/24/19 117 lb (53.1 kg)    BP 126/84   Pulse 69   Temp 98.9 F (37.2 C)   Ht 5' 6.5" (1.689 m)   Wt 120 lb 4 oz (54.5 kg)   SpO2 97%   BMI 19.12 kg/m    Physical Exam Constitutional:      General: She is not in acute distress.    Appearance: She is well-developed. She is not diaphoretic.  HENT:     Right Ear: External ear normal.     Left Ear: External ear normal.     Nose: Nose normal.  Eyes:     Conjunctiva/sclera: Conjunctivae normal.  Neck:     Musculoskeletal: Neck supple.  Cardiovascular:     Rate and Rhythm: Normal rate and regular rhythm.     Heart sounds: No murmur.  Pulmonary:     Effort: Pulmonary effort is normal. No respiratory distress.     Breath sounds: Normal breath sounds. No wheezing.  Skin:    General: Skin is warm and dry.     Capillary Refill: Capillary refill takes less than 2 seconds.  Neurological:     Mental Status: She is alert. Mental status is at baseline.  Psychiatric:        Mood and Affect: Mood normal.        Behavior: Behavior  normal.      Lab Results  Component Value Date   CHOL 191 11/27/2018   HDL 79 (A) 11/27/2018   LDLCALC 108 11/27/2018   TRIG 134 11/27/2018   The 10-year ASCVD risk score Barbara Krause DC Jr., et al., 2013) is: 22.1%*   Values used to calculate the score:     Age: 40 years     Sex: Female     Is Non-Hispanic African American: No     Diabetic: No     Tobacco smoker: Yes     Systolic Blood Pressure: 123XX123 mmHg     Is BP treated: No     HDL Cholesterol: 79 mg/dL*     Total Cholesterol: 191 mg/dL*     * - Cholesterol units were assumed for this score calculation      Assessment & Plan:   Problem List Items Addressed This Visit      Cardiovascular and Mediastinum   HTN (hypertension) - Primary    Not on medication. Controlled.       Relevant Medications   atorvastatin (  LIPITOR) 10 MG tablet   aspirin EC 81 MG tablet   Aortic atherosclerosis (HCC)    Discussed starting Statin and ASA to lower risk of MI/Stroke and patient was agreeable. Not interested in quitting smoking at this time.       Relevant Medications   atorvastatin (LIPITOR) 10 MG tablet   aspirin EC 81 MG tablet     Nervous and Auditory   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[place])    Follows with specialist. Feels she is stable with her disease and the medication is helping        Musculoskeletal and Integument   Osteoporosis    Pt was on hormone therapy in [place], but completed. Will repeat DEXA to reassess. Consider endocrine referral pending results.       Relevant Medications   Vitamin D, Ergocalciferol, (DRISDOL) 1.25 MG (50000 UT) CAPS capsule   Other Relevant Orders   DG Bone Density     Other   Tobacco use    Pt not interested in smoking cessation at this time. Counseled on risks of smoking, but declined quitting.        Hx of breast cancer    Screening ordered      Relevant Orders   US BREAST LTD UNI LEFT INC AXILLA   US BREAST LTD UNI RIGHT INC AXILLA   S/P bilateral mastectomy   Relevant Orders   US  BREAST LTD UNI LEFT INC AXILLA   US BREAST LTD UNI RIGHT INC AXILLA    Other Visit Diagnoses    Vitamin D deficiency       Relevant Medications   Vitamin D, Ergocalciferol, (DRISDOL) 1.25 MG (50000 UT) CAPS capsule       Return in about 6 months (around 03/16/2020).  Barbara Noe, MD

## 2019-09-17 NOTE — Assessment & Plan Note (Signed)
Pt not interested in smoking cessation at this time. Counseled on risks of smoking, but declined quitting.

## 2019-10-01 ENCOUNTER — Ambulatory Visit: Payer: Medicare Other

## 2019-10-08 ENCOUNTER — Other Ambulatory Visit: Payer: Self-pay | Admitting: Neurology

## 2019-10-08 DIAGNOSIS — R1312 Dysphagia, oropharyngeal phase: Secondary | ICD-10-CM

## 2019-10-08 DIAGNOSIS — G10 Huntington's disease: Secondary | ICD-10-CM

## 2019-10-10 ENCOUNTER — Other Ambulatory Visit: Payer: Self-pay | Admitting: Neurology

## 2019-10-10 DIAGNOSIS — R633 Feeding difficulties, unspecified: Secondary | ICD-10-CM

## 2019-10-10 DIAGNOSIS — R1312 Dysphagia, oropharyngeal phase: Secondary | ICD-10-CM

## 2019-10-23 ENCOUNTER — Ambulatory Visit
Admission: RE | Admit: 2019-10-23 | Discharge: 2019-10-23 | Disposition: A | Discharge status: Home / Self Care | Payer: Medicare Other | Source: Ambulatory Visit | Attending: Neurology | Admitting: Neurology

## 2019-10-23 ENCOUNTER — Other Ambulatory Visit: Payer: Self-pay

## 2019-10-23 DIAGNOSIS — R1314 Dysphagia, pharyngoesophageal phase: Secondary | ICD-10-CM | POA: Diagnosis present

## 2019-10-23 DIAGNOSIS — R633 Feeding difficulties, unspecified: Secondary | ICD-10-CM

## 2019-10-23 DIAGNOSIS — R1312 Dysphagia, oropharyngeal phase: Secondary | ICD-10-CM

## 2019-10-23 NOTE — Therapy (Addendum)
[facility] [place], [place], [address] Phone: [phone]   Fax:     Modified Barium Swallow  Patient Details  Name: Barbara Krause MRN: [id number] Date of Birth: 1941/09/24 No data recorded  Encounter Date: 10/23/2019  End of Session - 10/23/19 1749    Visit Number  1    Number of Visits  1    Date for SLP Re-Evaluation  10/23/19    SLP Start Time  1245    SLP Stop Time   1400    SLP Time Calculation (min)  75 min    Activity Tolerance  Patient tolerated treatment well       Past Medical History:  Diagnosis Date  . Breast cancer ([place])   . Depression   . History of chicken pox   . History of colon polyps   . 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[place])   . Osteoporosis     Past Surgical History:  Procedure Laterality Date  . [place]  . CHOLECYSTECTOMY    . TOTAL MASTECTOMY Bilateral 2009    There were no vitals filed for this visit.       Subjective: Patient behavior: (alertness, ability to follow instructions, etc.): pt stated she had a MBSS performed in [place] "years ago" w/ no indication of dysphagia reported then per pt. Pt reports inconsistent episodes of coughing (w/ foods primarily) during meals -- no particular food at any particular time, per pt report. OM exam: WFL. Native Dentition.  Chief complaint: dysphaiga   Objective:  Radiological Procedure: A videoflouroscopic evaluation of oral-preparatory, reflex initiation, and pharyngeal phases of the swallow was performed; as well as a screening of the upper esophageal phase.  I. POSTURE: upright II. VIEW: lateral III. COMPENSATORY STRATEGIES: intermittent lingual sweep w/ dry, f/u swallow  IV. BOLUSES ADMINISTERED:  Thin Liquid: 6 trials  Nectar-thick Liquid: 2 trials  Honey-thick Liquid: NT  Puree: 3 trials  Mechanical Soft: 3 trials  Barium Tablet: Whole in tsp of puree V. RESULTS OF EVALUATION: A. ORAL PREPARATORY  PHASE: (The lips, tongue, and velum are observed for strength and coordination)       **Overall Severity Rating: grossly WFL-Mild. Bolus piecemealing noted w/ trials of foods; inconsistent oral (diffuse) residue noted almost as if unaware of the residue. Using the strategy of dry, f/u swallow, pt cleared oral cavity completely. This was utilized intermittently as instructed by SLP.  B. SWALLOW INITIATION/REFLEX: (The reflex is normal if "triggered" by the time the bolus reached the base of the tongue)  **Overall Severity Rating: [facility]. Pt exhibited timely pharyngeal swallow initiation w/ No laryngeal penetration or aspiration noted; adequate and timely airway closure.   C. PHARYNGEAL PHASE: (Pharyngeal function is normal if the bolus shows rapid, smooth, and continuous transit through the pharynx and there is no pharyngeal residue after the swallow)  **Overall Severity Rating: grossly WFL. No signifcant pharyngeal residue remained post swallows indicating adequate laryngeal excursion and pharyngeal pressure during the swallows.   D. LARYNGEAL PENETRATION: (Material entering into the laryngeal inlet/vestibule but not aspirated): NONE  E. ASPIRATION: NONE F. ESOPHAGEAL PHASE: (Screening of the upper esophagus): slower Esophageal clearing noted; bolus stasis occurred x1 w/ trial of graham cracker(moistened w/ puree)   ASSESSMENT: Pt appears to present w/ slight-mild oral phase dysphagia; almost a min lack of awareness of oral residue during bolus management of food trials. During trials of increased texture/solids, pt exhibited min bolus piecemealing w/  foods; inconsistent oral (diffuse) residue noted almost as if unaware of the residue. Using the strategy (taught by SLP) of lingual sweep w/ a Dry, f/u swallow, pt cleared oral cavity completely. This was utilized intermittently as instructed by SLP. During the pharyngeal phase, Pt exhibited timely pharyngeal swallow initiation w/ No laryngeal penetration or  aspiration noted; adequate and timely airway closure. No signifcant pharyngeal residue remained post swallows indicating adequate laryngeal excursion and pharyngeal pressure during the swallows. During the Esophageal phase, slower Esophageal clearing noted; bolus stasis occurred x1 w/ trial of graham cracker(moistened w/ puree). ANY Esophageal phase dysmotility can result in Reflux activity and include overt s/s including the "Dry" cough, or Reflux cough. Recommend monitoring for such s/s during/post meals.    PLAN/RECOMMENDATIONS:  A. Diet: more of a Mech Soft diet consistency w/ Meats CUT well and moistened well; Thin liquids. Pt was taught using a Puree for swallowing Pills if needed for ease of swallowing/clearing  B. Swallowing Precautions: general aspiration precautions including f/u, Dry swallow to clear any remaining oral residue.   C. Recommended consultation to: f/u w/ GI for assessment of Esophageal phase motility; further education and management  D. Therapy recommendations: None at this time. Recommended pt monitor for any s/s of aspiration Esophageal phase dysmotility and overt coughing w/ oral intake writing it down in a Food Journal to increase awareness of any similarities or issues w/ particular foods/times  E. Results and recommendations were discussed w/ patient; video viewed; questions answered; precautions/strategies discussed and practiced         Dysphagia, pharyngoesophageal phase  Oropharyngeal dysphagia - Plan: DG SWALLOW FUNC OP MEDICARE SPEECH PATH, DG SWALLOW FUNC OP MEDICARE SPEECH PATH  Feeding difficulties - Plan: DG SWALLOW FUNC OP MEDICARE SPEECH PATH, DG SWALLOW FUNC OP MEDICARE SPEECH PATH        Problem List Patient Active Problem List   Diagnosis Date Noted  . Aortic atherosclerosis ([place]) 08/30/2019  . 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[place]) 08/30/2019  . Microscopic hematuria 04/02/2019  . Chronic fatigue syndrome 04/02/2019  . Osteoporosis 04/02/2019  .  Vitamin B12 deficiency 04/02/2019  . 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 HTN (hypertension) 03/05/2019  . 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[place]) 03/05/2019  . Tobacco use 03/05/2019  . Depression, major, single episode, complete remission ([place]) 03/05/2019  . Hx of breast cancer 03/05/2019  . S/P bilateral mastectomy 03/05/2019          Orinda Kenner, MS, CCC-SLP Suzana Sohail 10/23/2019, 5:51 PM  [facility] [place], [place], [address] Phone: [phone]   Fax:     Name: Barbara Krause MRN: [id number] Date of Birth: August 27, 1941

## 2019-11-06 ENCOUNTER — Ambulatory Visit
Admission: RE | Admit: 2019-11-06 | Discharge: 2019-11-06 | Disposition: A | Discharge status: Home / Self Care | Payer: Medicare Other | Source: Ambulatory Visit | Attending: Family Medicine | Admitting: Family Medicine

## 2019-11-06 DIAGNOSIS — M810 Age-related osteoporosis without current pathological fracture: Secondary | ICD-10-CM | POA: Diagnosis not present

## 2019-11-19 ENCOUNTER — Encounter: Payer: Self-pay | Admitting: Family Medicine

## 2019-11-19 ENCOUNTER — Ambulatory Visit (INDEPENDENT_AMBULATORY_CARE_PROVIDER_SITE_OTHER): Payer: Medicare Other | Admitting: Family Medicine

## 2019-11-19 ENCOUNTER — Other Ambulatory Visit: Payer: Self-pay

## 2019-11-19 VITALS — BP 112/88 | HR 78 | Temp 98.4°F | Ht 66.5 in | Wt 120.0 lb

## 2019-11-19 DIAGNOSIS — I700 Atherosclerosis of aorta: Secondary | ICD-10-CM

## 2019-11-19 DIAGNOSIS — M810 Age-related osteoporosis without current pathological fracture: Secondary | ICD-10-CM | POA: Diagnosis not present

## 2019-11-19 NOTE — Progress Notes (Signed)
   Subjective:     Barbara Krause is a 79 y.o. female presenting for Discuss Bone density results     HPI  #Osteoporosis  - was getting referred prior to moving - did take Femora after breast cancer - did take Evista x 6 years - does weight bearing exercise - takes Vit D - does not get calcium in diet, does not feel she could increase - does not eat a lot - muffins for breakfast, dinner - going out to eat  #Aortic atherosclerosis - taking statin and ASA - not interested in quitting smoking  Review of Systems   Social History   Tobacco Use  Smoking Status Current Every Day Smoker  . Packs/day: 0.75  . Years: 50.00  . Pack years: 37.50  . Types: Cigarettes  Smokeless Tobacco Never Used        Objective:    BP Readings from Last 3 Encounters:  11/19/19 112/88  09/17/19 126/84  07/24/19 (!) 152/93   Wt Readings from Last 3 Encounters:  11/19/19 120 lb (54.4 kg)  09/17/19 120 lb 4 oz (54.5 kg)  08/23/19 123 lb (55.8 kg)    BP 112/88   Pulse 78   Temp 98.4 F (36.9 C)   Ht 5' 6.5" (1.689 m)   Wt 120 lb (54.4 kg)   BMI 19.08 kg/m    Physical Exam Constitutional:      General: She is not in acute distress.    Appearance: She is well-developed. She is not diaphoretic.  HENT:     Right Ear: External ear normal.     Left Ear: External ear normal.  Eyes:     Conjunctiva/sclera: Conjunctivae normal.  Cardiovascular:     Rate and Rhythm: Normal rate.  Pulmonary:     Effort: Pulmonary effort is normal.  Musculoskeletal:     Cervical back: Neck supple.  Skin:    General: Skin is warm and dry.     Capillary Refill: Capillary refill takes less than 2 seconds.  Neurological:     Mental Status: She is alert. Mental status is at baseline.  Psychiatric:        Mood and Affect: Mood normal.        Behavior: Behavior normal.         Assessment & Plan:   Problem List Items Addressed This Visit      Cardiovascular and Mediastinum   Aortic  atherosclerosis ([place])    Reviewed that she is taking statin and ASA        Musculoskeletal and Integument   Osteoporosis - Primary    Advised Vit D and calcium. Pt not willing to quit smoking at this time. Given that she has previously been treated will refer to endocrinology to discuss options if any for worsening bone density.       Relevant Orders   Ambulatory referral to Endocrinology       Return in about 6 months (around 05/18/2020).  [name], MD

## 2019-11-19 NOTE — Patient Instructions (Addendum)
Your DEXA showed Osteoporosis.    Would recommend the following:   1) 800 units of Vitamin D daily 2) Get 1200 mg of elemental calcium --- this is best from your diet. Try to track how much calcium you get on a typical day. You could find ways to add more (dairy products, leafy greens). Take a supplement for whatever you don't typically get so you reach 1200 mg of calcium.  3) Physical activity (ideally weight bearing) - like walking briskly 30 minutes 5 days a week.

## 2019-11-19 NOTE — Assessment & Plan Note (Signed)
Reviewed that she is taking statin and ASA

## 2019-11-19 NOTE — Assessment & Plan Note (Signed)
Advised Vit D and calcium. Pt not willing to quit smoking at this time. Given that she has previously been treated will refer to endocrinology to discuss options if any for worsening bone density.

## 2019-12-27 ENCOUNTER — Encounter: Payer: Self-pay | Admitting: Family Medicine

## 2019-12-27 ENCOUNTER — Ambulatory Visit
Admission: RE | Admit: 2019-12-27 | Discharge: 2019-12-27 | Disposition: A | Discharge status: Home / Self Care | Payer: Medicare Other | Source: Ambulatory Visit | Attending: Family Medicine | Admitting: Family Medicine

## 2019-12-27 DIAGNOSIS — Z9013 Acquired absence of bilateral breasts and nipples: Secondary | ICD-10-CM

## 2019-12-27 DIAGNOSIS — Z853 Personal history of malignant neoplasm of breast: Secondary | ICD-10-CM

## 2020-01-22 ENCOUNTER — Ambulatory Visit: Payer: Medicare Other | Admitting: Urology

## 2020-02-11 NOTE — Progress Notes (Signed)
02/12/20 9:46 AM   Barbara Krause 1941/08/19 [id number]  Referring provider: [name], MD [place],   [id number]  Chief Complaint  Patient presents with  . Follow-up    HPI: Barbara Krause is a 79 y.o. F with a hx of microscopic hematuria returns today for a 6 month f/u for the evaluation and management of nephrolithiasis.   She underwent a CT urogram in 12/2018 indicating a known right right nonobstructing right lower pole stone measuring 2.2 cm consistent with KUB from 07/05/2019.Marland Kitchen Cystoscopy from 07/2019 was negative.   She was also seen and evaluated for dysuria which was found to have no evidence of infection but significant atrophic vulva/vaginitis.  She has been using vaginal moisturizers with significant relief for her discomfort.  KUB today indicated 2.2 cm right lower pole stone unchanged from 06/2019.  No complaints today. Denies gross hematuria.  No flank pain.  She has never had any issues with the stone, incidental.  She is a current smoker.   PMH: Past Medical History:  Diagnosis Date  . Breast cancer ([place])   . Depression   . History of chicken pox   . History of colon polyps   . 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 Osteoporosis     Surgical History: Past Surgical History:  Procedure Laterality Date  . [place]  . CHOLECYSTECTOMY    . TOTAL MASTECTOMY Bilateral 2009    Home Medications:  Allergies as of 02/12/2020      Reactions   Penicillin G Hives      Medication List       Accurate as of February 12, 2020 11:59 PM. If you have any questions, ask your nurse or doctor.        alendronate 70 MG tablet Commonly known as: FOSAMAX Take by mouth.   aspirin EC 81 MG tablet Take 1 tablet (81 mg total) by mouth daily.   atorvastatin 10 MG tablet Commonly known as: LIPITOR Take 1 tablet (10 mg total) by mouth daily.   Austedo 12 MG Tabs Generic drug: Deutetrabenazine Take 1 tablet by mouth 2 (two) times  a day.   donepezil 5 MG tablet Commonly known as: ARICEPT Take 1 tablet by mouth daily.   escitalopram 20 MG tablet Commonly known as: LEXAPRO Take 1 tablet (20 mg total) by mouth daily.   Vitamin D (Ergocalciferol) 1.25 MG (50000 UNIT) Caps capsule Commonly known as: DRISDOL Take 1 capsule (50,000 Units total) by mouth every 7 (seven) days. Due for follow up in November. Please schedule       Allergies:  Allergies  Allergen Reactions  . Penicillin G Hives    Family History: Family History  Problem Relation Age of Onset  . Colon cancer Mother   . Stroke Father   . Heart disease Father   . Ovarian cancer Sister   . Healthy Daughter   . Stomach cancer Maternal Grandmother   . Diabetes Maternal Grandfather   . Colon cancer Paternal Grandmother   . Diabetes Paternal Grandfather   . Healthy Daughter     Social History:  reports that she has been smoking cigarettes. She has a 37.50 pack-year smoking history. She has never used smokeless tobacco. She reports current alcohol use. She reports that she does not use drugs.   Physical Exam: BP (!) 145/88   Pulse 60   Ht 5' 6.5" (1.689 m)   BMI 19.08 kg/m  Constitutional:  Alert and oriented, No acute distress.  In wheelchair today. HEENT: [place] AT, moist mucus membranes.  Trachea midline, no masses. Cardiovascular: No clubbing, cyanosis, or edema. Respiratory: Normal respiratory effort, no increased work of breathing. Skin: No rashes, bruises or suspicious lesions. Neurologic: Grossly intact, no focal deficits, moving all 4 extremities. Psychiatric: Normal mood and affect.  Pertinent Imaging: KUB today reviewed. See Epic.  Agree with radiologic interpretation.  Right lower pole stone burden unchanged compared to previous imaging.  Assessment & Plan:    1. Kidney stone Large right lower pole stone measuring 2.2 cm unchanged since 06/2019  Given her multiple medical comorbidities and stable nature of the stone which is  asymptomatic, will continue to follow with serial imaging unless she becomes symptomatic F/u in 1 year with KUB and UA   2. Hx of microscopic hematuria  S/p negative cysto, will check urine at next visit  We will continue to monitor closely given that she is a continued smoker, high risk.  Consider repeat imaging or cystoscopy in the future if hematuria persists.  She was advised to return sooner if she develops any recurrent gross hematuria.  Return in about 1 year (around 02/11/2021) for Kub and UA prior.  [facility] [address] [place], [place] [address] [phone]  I, Lucas Mallow, am acting as a scribe for Dr. Hollice Espy,  I have reviewed the above documentation for accuracy and completeness, and I agree with the above.   Hollice Espy, MD

## 2020-02-12 ENCOUNTER — Ambulatory Visit
Admission: RE | Admit: 2020-02-12 | Discharge: 2020-02-12 | Disposition: A | Discharge status: Home / Self Care | Payer: Medicare Other | Source: Ambulatory Visit | Attending: Urology | Admitting: Urology

## 2020-02-12 ENCOUNTER — Encounter: Payer: Self-pay | Admitting: Urology

## 2020-02-12 ENCOUNTER — Other Ambulatory Visit: Payer: Self-pay

## 2020-02-12 ENCOUNTER — Ambulatory Visit (INDEPENDENT_AMBULATORY_CARE_PROVIDER_SITE_OTHER): Payer: Medicare Other | Admitting: Urology

## 2020-02-12 VITALS — BP 145/88 | HR 60 | Ht 66.5 in

## 2020-02-12 DIAGNOSIS — R3129 Other microscopic hematuria: Secondary | ICD-10-CM | POA: Diagnosis not present

## 2020-02-12 DIAGNOSIS — N200 Calculus of kidney: Secondary | ICD-10-CM

## 2020-02-27 ENCOUNTER — Other Ambulatory Visit: Payer: Self-pay | Admitting: Family Medicine

## 2020-02-27 DIAGNOSIS — F325 Major depressive disorder, single episode, in full remission: Secondary | ICD-10-CM

## 2020-02-28 ENCOUNTER — Encounter: Payer: Self-pay | Admitting: Family Medicine

## 2020-02-28 ENCOUNTER — Ambulatory Visit (INDEPENDENT_AMBULATORY_CARE_PROVIDER_SITE_OTHER)
Admission: RE | Admit: 2020-02-28 | Discharge: 2020-02-28 | Disposition: A | Discharge status: Home / Self Care | Payer: Medicare Other | Source: Ambulatory Visit | Attending: Family Medicine | Admitting: Family Medicine

## 2020-02-28 ENCOUNTER — Other Ambulatory Visit: Payer: Self-pay

## 2020-02-28 ENCOUNTER — Ambulatory Visit (INDEPENDENT_AMBULATORY_CARE_PROVIDER_SITE_OTHER): Payer: Medicare Other | Admitting: Family Medicine

## 2020-02-28 VITALS — BP 116/72 | HR 69 | Temp 97.7°F | Ht 66.5 in | Wt 122.0 lb

## 2020-02-28 DIAGNOSIS — M25522 Pain in left elbow: Secondary | ICD-10-CM

## 2020-02-28 DIAGNOSIS — S52002A Unspecified fracture of upper end of left ulna, initial encounter for closed fracture: Secondary | ICD-10-CM

## 2020-02-28 DIAGNOSIS — M25422 Effusion, left elbow: Secondary | ICD-10-CM

## 2020-02-28 DIAGNOSIS — M810 Age-related osteoporosis without current pathological fracture: Secondary | ICD-10-CM

## 2020-02-28 DIAGNOSIS — S0083XA Contusion of other part of head, initial encounter: Secondary | ICD-10-CM | POA: Diagnosis not present

## 2020-02-28 DIAGNOSIS — W19XXXA Unspecified fall, initial encounter: Secondary | ICD-10-CM

## 2020-02-28 NOTE — Assessment & Plan Note (Signed)
Causing pain and swelling in elbow.  Placed in sling. Ice and elevate.  Given displacement.. refer to [facility] for eval and treatment.

## 2020-02-28 NOTE — Progress Notes (Signed)
Chief Complaint  Patient presents with  . Fell yesterday    History of Present Illness: HPI  79 year old female pt of Dr. Einar Pheasant with history of  Huntington's disease osteoporosis (on fosamax, just started this 3 weeks ago) presents following accidental fall yesterday  down garage steps ( lost balance) resulting in pain in  left arm.  Also hit head on left temple. No LOC. No dizziness following. Pain is greatest in left elbow.  Unable to put pressure on left arm.   No proceeding CP, no palpitations, no SOB, no dizziness.  No headache. No lacerations.   no history of fracture.  This visit occurred during the SARS-CoV-2 public health emergency.  Safety protocols were in place, including screening questions prior to the visit, additional usage of staff PPE, and extensive cleaning of exam room while observing appropriate contact time as indicated for disinfecting solutions.   COVID 19 screen:  No recent travel or known exposure to COVID19 The patient denies respiratory symptoms of COVID 19 at this time. The importance of social distancing was discussed today.     Review of Systems  Constitutional: Negative for chills and fever.  HENT: Negative for congestion and ear pain.   Eyes: Negative for pain and redness.  Respiratory: Negative for cough and shortness of breath.   Cardiovascular: Negative for chest pain, palpitations and leg swelling.  Gastrointestinal: Negative for abdominal pain, blood in stool, constipation, diarrhea, nausea and vomiting.  Genitourinary: Negative for dysuria.  Musculoskeletal: Negative for falls and myalgias.  Skin: Negative for rash.  Neurological: Negative for dizziness.  Psychiatric/Behavioral: Negative for depression. The patient is not nervous/anxious.       Past Medical History:  Diagnosis Date  . Breast cancer ([place])   . Depression   . History of chicken pox   . History of colon polyps   . 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 Osteoporosis     reports that she has been smoking cigarettes. She has a 37.50 pack-year smoking history. She has never used smokeless tobacco. She reports current alcohol use. She reports that she does not use drugs.   Current Outpatient Medications:  .  alendronate (FOSAMAX) 70 MG tablet, Take by mouth., Disp: , Rfl:  .  aspirin EC 81 MG tablet, Take 1 tablet (81 mg total) by mouth daily., Disp: 90 tablet, Rfl: 3 .  atorvastatin (LIPITOR) 10 MG tablet, Take 1 tablet (10 mg total) by mouth daily., Disp: 90 tablet, Rfl: 3 .  AUSTEDO 12 MG TABS, Take 1 tablet by mouth 2 (two) times a day., Disp: , Rfl:  .  donepezil (ARICEPT) 5 MG tablet, Take 1 tablet by mouth daily., Disp: , Rfl:  .  escitalopram (LEXAPRO) 20 MG tablet, TAKE 1 TABLET BY MOUTH EVERY DAY, Disp: 90 tablet, Rfl: 3 .  Vitamin D, Ergocalciferol, (DRISDOL) 1.25 MG (50000 UT) CAPS capsule, Take 1 capsule (50,000 Units total) by mouth every 7 (seven) days. Due for follow up in November. Please schedule, Disp: 12 capsule, Rfl: 3   Observations/Objective: Blood pressure 116/72, pulse 69, temperature 97.7 F (36.5 C), temperature source Temporal, height 5' 6.5" (1.689 m), weight 122 lb (55.3 kg), SpO2 94 %.  Physical Exam Constitutional:      General: She is not in acute distress.    Appearance: Normal appearance. She is well-developed. She is not ill-appearing or toxic-appearing.  HENT:     Head: Normocephalic.     Right Ear: Hearing, tympanic membrane, ear canal and external  ear normal. Tympanic membrane is not erythematous, retracted or bulging.     Left Ear: Hearing, tympanic membrane, ear canal and external ear normal. Tympanic membrane is not erythematous, retracted or bulging.     Nose: No mucosal edema or rhinorrhea.     Right Sinus: No maxillary sinus tenderness or frontal sinus tenderness.     Left Sinus: No maxillary sinus tenderness or frontal sinus tenderness.     Mouth/Throat:     Pharynx: Uvula midline.  Eyes:     General: Lids are  normal. Lids are everted, no foreign bodies appreciated.     Conjunctiva/sclera: Conjunctivae normal.     Pupils: Pupils are equal, round, and reactive to light.  Neck:     Thyroid: No thyroid mass or thyromegaly.     Vascular: No carotid bruit.     Trachea: Trachea normal.  Cardiovascular:     Rate and Rhythm: Normal rate and regular rhythm.     Pulses: Normal pulses.     Heart sounds: Normal heart sounds, S1 normal and S2 normal. No murmur. No friction rub. No gallop.   Pulmonary:     Effort: Pulmonary effort is normal. No tachypnea or respiratory distress.     Breath sounds: Normal breath sounds. No decreased breath sounds, wheezing, rhonchi or rales.  Abdominal:     General: Bowel sounds are normal.     Palpations: Abdomen is soft.     Tenderness: There is no abdominal tenderness.  Musculoskeletal:     Right elbow: Normal.     Left elbow: Swelling and deformity present. Decreased range of motion. Tenderness present in radial head and olecranon process.     Cervical back: Normal range of motion and neck supple.     Comments:  Diffuse elbow pain  contusion at elbow  Swelling in elbow and forearm.  Skin:    General: Skin is warm and dry.     Findings: No rash.  Neurological:     Mental Status: She is alert.     Sensory: Sensation is intact.     Coordination: Coordination abnormal.     Comments:  Abnormal gait  Psychiatric:        Mood and Affect: Mood is not anxious or depressed.        Speech: Speech normal.        Behavior: Behavior normal. Behavior is cooperative.        Thought Content: Thought content normal.        Judgment: Judgment normal.      Assessment and Plan Accidental fall No proceeding symptoms.. fall related to instability with Huntingtons.  Forehead contusion No LOC or s/s of concussion. Healing well.  Closed fracture of upper end of left ulna Causing pain and swelling in elbow.  Placed in sling. Ice and elevate.  Given displacement.. refer to  [facility] for eval and treatment.        Eliezer Lofts, MD

## 2020-02-28 NOTE — Assessment & Plan Note (Signed)
No LOC or s/s of concussion. Healing well.

## 2020-02-28 NOTE — Assessment & Plan Note (Signed)
No proceeding symptoms.. fall related to instability with Huntingtons.

## 2020-04-28 ENCOUNTER — Encounter: Payer: Self-pay | Admitting: Cardiology

## 2020-04-28 ENCOUNTER — Ambulatory Visit (INDEPENDENT_AMBULATORY_CARE_PROVIDER_SITE_OTHER): Payer: Medicare Other | Admitting: Cardiology

## 2020-04-28 ENCOUNTER — Other Ambulatory Visit: Payer: Self-pay

## 2020-04-28 VITALS — BP 150/102 | HR 50 | Ht 67.0 in | Wt 116.5 lb

## 2020-04-28 DIAGNOSIS — E7800 Pure hypercholesterolemia, unspecified: Secondary | ICD-10-CM | POA: Diagnosis not present

## 2020-04-28 DIAGNOSIS — I10 Essential (primary) hypertension: Secondary | ICD-10-CM | POA: Diagnosis not present

## 2020-04-28 DIAGNOSIS — F17200 Nicotine dependence, unspecified, uncomplicated: Secondary | ICD-10-CM

## 2020-04-28 MED ORDER — LOSARTAN POTASSIUM 25 MG PO TABS: 25.0000 mg | ORAL_TABLET | Freq: Every day | ORAL | 6 refills | Status: DC

## 2020-04-28 NOTE — Progress Notes (Signed)
Cardiology Office Note:    Date:  04/28/2020   ID:  Barbara Krause, DOB Jun 18, 1941, MRN [id number]  PCP:  [name], MD  [facility] Cardiologist:  Kate Sable, MD  [place] Electrophysiologist:  None   Referring MD: [name], MD   Chief Complaint  Patient presents with  . OTHER    HTN no complaints today. Meds reviewed verbally with pt.    History of Present Illness:    Barbara Krause is a 79 y.o. female with a hx of Huntington's disease, osteoporosis, current smoker x50+ years who presents due to elevated blood pressures.  Patient will be evaluated for a new medication trial to manage Huntington's disease a week and a half ago.  During this evaluation, her blood pressures were noted to be elevated with systolic in the 875I.  She could not be enrolled in the trial.  She denies any history of heart disease, denies chest pain no shortness of breath at rest or with exertion.  She has no other complaints today.  Would like to have her blood pressure controlled so she could be called to the for the trial. .  Past Medical History:  Diagnosis Date  . Breast cancer ([place])    bilateral  . Depression   . History of chicken pox   . History of colon polyps   . 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[place])   . Kidney stone   . Osteoporosis     Past Surgical History:  Procedure Laterality Date  . [place]  . CHOLECYSTECTOMY    . TOTAL MASTECTOMY Bilateral 2009    Current Medications: No outpatient medications have been marked as taking for the 04/28/20 encounter (Office Visit) with Kate Sable, MD.     Allergies:   Penicillin g   Social History   Socioeconomic History  . Marital status: Married    Spouse name: Fritz Pickerel  . Number of children: 2  . Years of education: Master's Degree in SW  . Highest education level: Not on file  Occupational History  . Not on file  Tobacco Use  . Smoking status: Current Every Day Smoker    Packs/day: 0.75     Years: 50.00    Pack years: 37.50    Types: Cigarettes  . Smokeless tobacco: Never Used  Vaping Use  . Vaping Use: Never used  Substance and Sexual Activity  . Alcohol use: Yes    Comment: about 1 glass of wine x 2 a week  . Drug use: Never  . Sexual activity: Not Currently  Other Topics Concern  . 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 from [place] to [place] to be near family   Enjoys: smoking, going to the movies and out to eat, seeing theater   Exercise: not currently   Diet: eats breakfast and dinner, but not much during the day   Social Determinants of Radio broadcast assistant Strain:   . Difficulty of Paying Living Expenses:   Food Insecurity:   . Worried About Charity fundraiser in the Last Year:   . Arboriculturist in the Last Year:   Transportation Needs:   . Film/video editor (Medical):   Marland Kitchen Lack of Transportation (Non-Medical):   Physical Activity:   . Days of Exercise per Week:   . Minutes of Exercise per Session:  Stress:   . Feeling of Stress :   Social Connections:   . Frequency of Communication with Friends and Family:   . Frequency of Social Gatherings with Friends and Family:   . Attends Religious Services:   . Active Member of Clubs or Organizations:   . Attends Archivist Meetings:   Marland Kitchen Marital Status:      Family History: The patient's family history includes Colon cancer in her mother and paternal grandmother; Diabetes in her maternal grandfather and paternal grandfather; Healthy in her daughter and daughter; Heart disease in her father; Ovarian cancer in her sister; Stomach cancer in her maternal grandmother; Stroke in her father.  ROS:   Please see the history of present illness.     All other systems reviewed and are negative.  EKGs/Labs/Other Studies Reviewed:    The following studies were reviewed today:   EKG:  EKG is  ordered today.  The ekg  ordered today demonstrates sinus bradycardia, heart rate 52  Recent Labs: No results found for requested labs within last 8760 hours.  Recent Lipid Panel    Component Value Date/Time   CHOL 191 11/27/2018 0000   TRIG 134 11/27/2018 0000   HDL 79 (A) 11/27/2018 0000   LDLCALC 108 11/27/2018 0000    Physical Exam:    VS:  BP (!) 150/102 (BP Location: Right Arm, Patient Position: Sitting, Cuff Size: Normal)   Pulse (!) 50   Ht 5\' 7"  (1.702 m)   Wt 116 lb 8 oz (52.8 kg)   SpO2 97%   BMI 18.25 kg/m     Wt Readings from Last 3 Encounters:  04/28/20 116 lb 8 oz (52.8 kg)  02/28/20 122 lb (55.3 kg)  11/19/19 120 lb (54.4 kg)     GEN:  Well nourished, well developed in no acute distress HEENT: Normal NECK: No JVD; No carotid bruits LYMPHATICS: No lymphadenopathy CARDIAC: RRR, no murmurs, rubs, gallops RESPIRATORY:  Clear to auscultation without rales, wheezing or rhonchi  ABDOMEN: Soft, non-tender, non-distended MUSCULOSKELETAL:  No edema; No deformity  SKIN: Warm and dry NEUROLOGIC:  Alert and oriented x 3 PSYCHIATRIC:  Normal affect   ASSESSMENT:    1. Hypertension, unspecified type   2. Smoking   3. Pure hypercholesterolemia    PLAN:    In order of problems listed above:  1. Patient with history of elevated blood pressure.  Blood pressures today elevated with systolic up to 017B.  She needs criteria for stage II hypertension (ECHO guidelines.  Will start patient on oral losartan 25 mg daily.  Daily blood pressure monitoring at home recommended.  Plan to titrate medication as needed to help adequate blood pressure control. 2. Current smoker x50+ years.  Cessation advised.  Over 5 minutes spent counseling patient. 3. Patient with history of hyperlipidemia, continue statin as prescribed.  Follow-up in 3 weeks  This note was generated in part or whole with voice recognition software. Voice recognition is usually quite accurate but there are transcription errors that can  and very often do occur. I apologize for any typographical errors that were not detected and corrected.  Medication Adjustments/Labs and Tests Ordered: Current medicines are reviewed at length with the patient today.  Concerns regarding medicines are outlined above.  Orders Placed This Encounter  Procedures  . EKG 12-Lead   Meds ordered this encounter  Medications  . losartan (COZAAR) 25 MG tablet    Sig: Take 1 tablet (25 mg total) by mouth daily.  Dispense:  30 tablet    Refill:  6    Patient Instructions  Medication Instructions:   Your physician has recommended you make the following change in your medication:   START Losartan(Cozaar): Take 1 tablet (25 mg total) by mouth daily.  *If you need a refill on your cardiac medications before your next appointment, please call your pharmacy*   Lab Work: Non Ordered If you have labs (blood work) drawn today and your tests are completely normal, you will receive your results only by: Marland Kitchen MyChart Message (if you have MyChart) OR . A paper copy in the mail If you have any lab test that is abnormal or we need to change your treatment, we will call you to review the results.   Testing/Procedures: None Ordered   Follow-Up: At [facility], you and your health needs are our priority.  As part of our continuing mission to provide you with exceptional heart care, we have created designated Provider Care Teams.  These Care Teams include your primary Cardiologist (physician) and Advanced Practice Providers (APPs -  Physician Assistants and Nurse Practitioners) who all work together to provide you with the care you need, when you need it.  We recommend signing up for the patient portal called "MyChart".  Sign up information is provided on this After Visit Summary.  MyChart is used to connect with patients for Virtual Visits (Telemedicine).  Patients are able to view lab/test results, encounter notes, upcoming appointments, etc.  Non-urgent  messages can be sent to your provider as well.   To learn more about what you can do with MyChart, go to [email or web address].    Your next appointment:   3 week(s)  The format for your next appointment:   In Person  Provider:   Kate Sable, MD   Other Instructions  Losartan Tablets What is this medicine? LOSARTAN (loe SAR tan) is an angiotensin II receptor blocker, also known as an ARB. It treats high blood pressure. It can slow kidney damage in some patients. It may also be used to lower the risk of stroke. This medicine may be used for other purposes; ask your health care provider or pharmacist if you have questions. COMMON BRAND NAME(S): Cozaar What should I tell my health care provider before I take this medicine? They need to know if you have any of these conditions:  heart failure  kidney or liver disease  an unusual or allergic reaction to losartan, other medicines, foods, dyes, or preservatives  pregnant or trying to get pregnant  breast-feeding How should I use this medicine? Take this drug by mouth. Take it as directed on the prescription label at the same time every day. You can take it with or without food. If it upsets your stomach, take it with food. Keep taking it unless your health care provider tells you to stop. Talk to your health care provider about the use of this drug in children. While it may be prescribed for children as young as 6 for selected conditions, precautions do apply. Overdosage: If you think you have taken too much of this medicine contact a poison control center or emergency room at once. NOTE: This medicine is only for you. Do not share this medicine with others. What if I miss a dose? If you miss a dose, take it as soon as you can. If it is almost time for your next dose, take only that dose. Do not take double or extra doses. What may interact with this  medicine?  blood pressure medicines  diuretics, especially triamterene,  spironolactone, or amiloride  fluconazole  NSAIDs, medicines for pain and inflammation, like ibuprofen or naproxen  potassium salts or potassium supplements  rifampin This list may not describe all possible interactions. Give your health care provider a list of all the medicines, herbs, non-prescription drugs, or dietary supplements you use. Also tell them if you smoke, drink alcohol, or use illegal drugs. Some items may interact with your medicine. What should I watch for while using this medicine? Visit your doctor or health care professional for regular checks on your progress. Check your blood pressure as directed. Ask your doctor or health care professional what your blood pressure should be and when you should contact him or her. Call your doctor or health care professional if you notice an irregular or fast heart beat. Women should inform their doctor if they wish to become pregnant or think they might be pregnant. There is a potential for serious side effects to an unborn child, particularly in the second or third trimester. Talk to your health care professional or pharmacist for more information. You may get drowsy or dizzy. Do not drive, use machinery, or do anything that needs mental alertness until you know how this drug affects you. Do not stand or sit up quickly, especially if you are an older patient. This reduces the risk of dizzy or fainting spells. Alcohol can make you more drowsy and dizzy. Avoid alcoholic drinks. Avoid salt substitutes unless you are told otherwise by your doctor or health care professional. Do not treat yourself for coughs, colds, or pain while you are taking this medicine without asking your doctor or health care professional for advice. Some ingredients may increase your blood pressure. What side effects may I notice from receiving this medicine? Side effects that you should report to your doctor or health care professional as soon as possible:  confusion,  dizziness, light headedness or fainting spells  decreased amount of urine passed  difficulty breathing or swallowing, hoarseness, or tightening of the throat  fast or irregular heart beat, palpitations, or chest pain  skin rash, itching  swelling of your face, lips, tongue, hands, or feet Side effects that usually do not require medical attention (report to your doctor or health care professional if they continue or are bothersome):  cough  decreased sexual function or desire  headache  nasal congestion or stuffiness  nausea or stomach pain  sore or cramping muscles This list may not describe all possible side effects. Call your doctor for medical advice about side effects. You may report side effects to FDA at 1-800-FDA-1088. Where should I keep my medicine? Keep out of the reach of children and pets. Store at room temperature between 15 and 30 degrees C (59 and 86 degrees F). Protect from light. Keep the container tightly closed. Throw away any unused drug after the expiration date. NOTE: This sheet is a summary. It may not cover all possible information. If you have questions about this medicine, talk to your doctor, pharmacist, or health care provider.  2020 Elsevier/Gold Standard (2019-05-23 12:12:28)     Signed, Kate Sable, MD  04/28/2020 1:10 PM    [facility]

## 2020-04-28 NOTE — Patient Instructions (Signed)
Medication Instructions:   Your physician has recommended you make the following change in your medication:   START Losartan(Cozaar): Take 1 tablet (25 mg total) by mouth daily.  *If you need a refill on your cardiac medications before your next appointment, please call your pharmacy*   Lab Work: Non Ordered If you have labs (blood work) drawn today and your tests are completely normal, you will receive your results only by: Marland Kitchen MyChart Message (if you have MyChart) OR . A paper copy in the mail If you have any lab test that is abnormal or we need to change your treatment, we will call you to review the results.   Testing/Procedures: None Ordered   Follow-Up: At [facility], you and your health needs are our priority.  As part of our continuing mission to provide you with exceptional heart care, we have created designated Provider Care Teams.  These Care Teams include your primary Cardiologist (physician) and Advanced Practice Providers (APPs -  Physician Assistants and Nurse Practitioners) who all work together to provide you with the care you need, when you need it.  We recommend signing up for the patient portal called "MyChart".  Sign up information is provided on this After Visit Summary.  MyChart is used to connect with patients for Virtual Visits (Telemedicine).  Patients are able to view lab/test results, encounter notes, upcoming appointments, etc.  Non-urgent messages can be sent to your provider as well.   To learn more about what you can do with MyChart, go to [email or web address].    Your next appointment:   3 week(s)  The format for your next appointment:   In Person  Provider:   Kate Sable, MD   Other Instructions  Losartan Tablets What is this medicine? LOSARTAN (loe SAR tan) is an angiotensin II receptor blocker, also known as an ARB. It treats high blood pressure. It can slow kidney damage in some patients. It may also be used to lower the risk of  stroke. This medicine may be used for other purposes; ask your health care provider or pharmacist if you have questions. COMMON BRAND NAME(S): Cozaar What should I tell my health care provider before I take this medicine? They need to know if you have any of these conditions:  heart failure  kidney or liver disease  an unusual or allergic reaction to losartan, other medicines, foods, dyes, or preservatives  pregnant or trying to get pregnant  breast-feeding How should I use this medicine? Take this drug by mouth. Take it as directed on the prescription label at the same time every day. You can take it with or without food. If it upsets your stomach, take it with food. Keep taking it unless your health care provider tells you to stop. Talk to your health care provider about the use of this drug in children. While it may be prescribed for children as young as 6 for selected conditions, precautions do apply. Overdosage: If you think you have taken too much of this medicine contact a poison control center or emergency room at once. NOTE: This medicine is only for you. Do not share this medicine with others. What if I miss a dose? If you miss a dose, take it as soon as you can. If it is almost time for your next dose, take only that dose. Do not take double or extra doses. What may interact with this medicine?  blood pressure medicines  diuretics, especially triamterene, spironolactone, or amiloride  fluconazole  NSAIDs,  medicines for pain and inflammation, like ibuprofen or naproxen  potassium salts or potassium supplements  rifampin This list may not describe all possible interactions. Give your health care provider a list of all the medicines, herbs, non-prescription drugs, or dietary supplements you use. Also tell them if you smoke, drink alcohol, or use illegal drugs. Some items may interact with your medicine. What should I watch for while using this medicine? Visit your doctor  or health care professional for regular checks on your progress. Check your blood pressure as directed. Ask your doctor or health care professional what your blood pressure should be and when you should contact him or her. Call your doctor or health care professional if you notice an irregular or fast heart beat. Women should inform their doctor if they wish to become pregnant or think they might be pregnant. There is a potential for serious side effects to an unborn child, particularly in the second or third trimester. Talk to your health care professional or pharmacist for more information. You may get drowsy or dizzy. Do not drive, use machinery, or do anything that needs mental alertness until you know how this drug affects you. Do not stand or sit up quickly, especially if you are an older patient. This reduces the risk of dizzy or fainting spells. Alcohol can make you more drowsy and dizzy. Avoid alcoholic drinks. Avoid salt substitutes unless you are told otherwise by your doctor or health care professional. Do not treat yourself for coughs, colds, or pain while you are taking this medicine without asking your doctor or health care professional for advice. Some ingredients may increase your blood pressure. What side effects may I notice from receiving this medicine? Side effects that you should report to your doctor or health care professional as soon as possible:  confusion, dizziness, light headedness or fainting spells  decreased amount of urine passed  difficulty breathing or swallowing, hoarseness, or tightening of the throat  fast or irregular heart beat, palpitations, or chest pain  skin rash, itching  swelling of your face, lips, tongue, hands, or feet Side effects that usually do not require medical attention (report to your doctor or health care professional if they continue or are bothersome):  cough  decreased sexual function or desire  headache  nasal congestion or  stuffiness  nausea or stomach pain  sore or cramping muscles This list may not describe all possible side effects. Call your doctor for medical advice about side effects. You may report side effects to FDA at 1-800-FDA-1088. Where should I keep my medicine? Keep out of the reach of children and pets. Store at room temperature between 15 and 30 degrees C (59 and 86 degrees F). Protect from light. Keep the container tightly closed. Throw away any unused drug after the expiration date. NOTE: This sheet is a summary. It may not cover all possible information. If you have questions about this medicine, talk to your doctor, pharmacist, or health care provider.  2020 Elsevier/Gold Standard (2019-05-23 12:12:28)

## 2020-05-19 ENCOUNTER — Ambulatory Visit: Payer: Medicare Other | Admitting: Cardiology

## 2020-05-22 ENCOUNTER — Ambulatory Visit (INDEPENDENT_AMBULATORY_CARE_PROVIDER_SITE_OTHER): Payer: Medicare Other | Admitting: Cardiology

## 2020-05-22 ENCOUNTER — Other Ambulatory Visit: Payer: Self-pay

## 2020-05-22 ENCOUNTER — Encounter: Payer: Self-pay | Admitting: Cardiology

## 2020-05-22 VITALS — BP 140/100 | HR 61 | Ht 67.0 in | Wt 117.0 lb

## 2020-05-22 DIAGNOSIS — I10 Essential (primary) hypertension: Secondary | ICD-10-CM

## 2020-05-22 DIAGNOSIS — F17200 Nicotine dependence, unspecified, uncomplicated: Secondary | ICD-10-CM

## 2020-05-22 DIAGNOSIS — E7800 Pure hypercholesterolemia, unspecified: Secondary | ICD-10-CM

## 2020-05-22 MED ORDER — LOSARTAN POTASSIUM 50 MG PO TABS: 50.0000 mg | ORAL_TABLET | Freq: Every day | ORAL | 11 refills | Status: DC

## 2020-05-22 NOTE — Patient Instructions (Signed)
Medication Instructions:  1- INCREASE Losartan Take 1 tablet (50 mg total) by mouth daily *If you need a refill on your cardiac medications before your next appointment, please call your pharmacy*   Lab Work: None ordered If you have labs (blood work) drawn today and your tests are completely normal, you will receive your results only by: Marland Kitchen MyChart Message (if you have MyChart) OR . A paper copy in the mail If you have any lab test that is abnormal or we need to change your treatment, we will call you to review the results.   Testing/Procedures: None ordered   Follow-Up: At [facility], you and your health needs are our priority.  As part of our continuing mission to provide you with exceptional heart care, we have created designated Provider Care Teams.  These Care Teams include your primary Cardiologist (physician) and Advanced Practice Providers (APPs -  Physician Assistants and Nurse Practitioners) who all work together to provide you with the care you need, when you need it.  We recommend signing up for the patient portal called "MyChart".  Sign up information is provided on this After Visit Summary.  MyChart is used to connect with patients for Virtual Visits (Telemedicine).  Patients are able to view lab/test results, encounter notes, upcoming appointments, etc.  Non-urgent messages can be sent to your provider as well.   To learn more about what you can do with MyChart, go to [email or web address].    Your next appointment:   1 month(s)  The format for your next appointment:   In Person  Provider:    You may see Kate Sable, MD or one of the following Advanced Practice Providers on your designated Care Team:    Murray Hodgkins, NP  Christell Faith, PA-C  Marrianne Mood, PA-C

## 2020-05-22 NOTE — Progress Notes (Signed)
Cardiology Office Note:    Date:  05/22/2020   ID:  Barbara Krause, DOB [birth year], MRN [id number]  PCP:  [name], MD  [facility] Cardiologist:  Kate Sable, MD  [place] Electrophysiologist:  None   Referring MD: [name], MD   Chief Complaint  Patient presents with  . office visit    3 week F/U; Meds verbally reviewed with patient.    History of Present Illness:    Barbara Krause is a 79 y.o. female with a hx of hypertension, Huntington's disease, osteoporosis, current smoker x50+ years who presents for follow-up.  She was last seen due to elevated blood pressures.  Losartan was started.  She has tolerated the medicine okay.  Checks her blood pressure frequently at home ranges in 856D to 149F systolic.  She otherwise feels okay, has no concerns at this time.  Historical notes Patient will be evaluated for a new medication trial to manage Huntington's disease a week and a half ago.  During this evaluation, her blood pressures were noted to be elevated with systolic in the 026V.  She could not be enrolled in the trial.  She denies any history of heart disease, denies chest pain no shortness of breath at rest or with exertion.  She has no other complaints today.  Would like to have her blood pressure controlled so she could be called to the for the trial. .  Past Medical History:  Diagnosis Date  . Breast cancer ([place])    bilateral  . Depression   . History of chicken pox   . History of colon polyps   . 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[place])   . Kidney stone   . Osteoporosis     Past Surgical History:  Procedure Laterality Date  . [place]  . CHOLECYSTECTOMY    . TOTAL MASTECTOMY Bilateral 2009    Current Medications: Current Meds  Medication Sig  . alendronate (FOSAMAX) 70 MG tablet Take 70 mg by mouth once a week.   Marland Kitchen aspirin EC 81 MG tablet Take 1 tablet (81 mg total) by mouth daily.  Marland Kitchen atorvastatin (LIPITOR) 10 MG tablet  Take 1 tablet (10 mg total) by mouth daily.  . AUSTEDO 12 MG TABS Take 1 tablet by mouth 2 (two) times a day.  . donepezil (ARICEPT) 5 MG tablet Take 1 tablet by mouth daily.  Marland Kitchen escitalopram (LEXAPRO) 20 MG tablet TAKE 1 TABLET BY MOUTH EVERY DAY  . losartan (COZAAR) 50 MG tablet Take 1 tablet (50 mg total) by mouth daily.  . Vitamin D, Ergocalciferol, (DRISDOL) 1.25 MG (50000 UT) CAPS capsule Take 1 capsule (50,000 Units total) by mouth every 7 (seven) days. Due for follow up in November. Please schedule  . [DISCONTINUED] losartan (COZAAR) 25 MG tablet Take 1 tablet (25 mg total) by mouth daily.     Allergies:   Penicillin g   Social History   Socioeconomic History  . Marital status: Married    Spouse name: Fritz Pickerel  . Number of children: 2  . Years of education: Master's Degree in SW  . Highest education level: Not on file  Occupational History  . Not on file  Tobacco Use  . Smoking status: Current Every Day Smoker    Packs/day: 0.75    Years: 50.00    Pack years: 37.50    Types: Cigarettes  . Smokeless tobacco: Never Used  Vaping Use  . Vaping Use: Never used  Substance and Sexual Activity  . Alcohol use: Yes    Comment: about 1 glass of wine x 2 a week  . Drug use: Never  . Sexual activity: Not Currently  Other Topics Concern  . 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 from [place] to [place] to be near family   Enjoys: smoking, going to the movies and out to eat, seeing theater   Exercise: not currently   Diet: eats breakfast and dinner, but not much during the day   Social Determinants of Radio broadcast assistant Strain:   . Difficulty of Paying Living Expenses:   Food Insecurity:   . Worried About Charity fundraiser in the Last Year:   . Arboriculturist in the Last Year:   Transportation Needs:   . Film/video editor (Medical):   Marland Kitchen Lack of Transportation (Non-Medical):   Physical  Activity:   . Days of Exercise per Week:   . Minutes of Exercise per Session:   Stress:   . Feeling of Stress :   Social Connections:   . Frequency of Communication with Friends and Family:   . Frequency of Social Gatherings with Friends and Family:   . Attends Religious Services:   . Active Member of Clubs or Organizations:   . Attends Archivist Meetings:   Marland Kitchen Marital Status:      Family History: The patient's family history includes Colon cancer in her mother and paternal grandmother; Diabetes in her maternal grandfather and paternal grandfather; Healthy in her daughter and daughter; Heart disease in her father; Ovarian cancer in her sister; Stomach cancer in her maternal grandmother; Stroke in her father.  ROS:   Please see the history of present illness.     All other systems reviewed and are negative.  EKGs/Labs/Other Studies Reviewed:    The following studies were reviewed today:   EKG:  EKG is  ordered today.  The ekg ordered today demonstrates normal sinus rhythm, heart rate 61  Recent Labs: No results found for requested labs within last 8760 hours.  Recent Lipid Panel    Component Value Date/Time   CHOL 191 11/27/2018 0000   TRIG 134 11/27/2018 0000   HDL 79 (A) 11/27/2018 0000   LDLCALC 108 11/27/2018 0000    Physical Exam:    VS:  BP (!) 140/100 (BP Location: Left Arm, Patient Position: Sitting, Cuff Size: Normal)   Pulse 61   Ht 5\' 7"  (1.702 m)   Wt 117 lb (53.1 kg)   SpO2 97%   BMI 18.32 kg/m     Wt Readings from Last 3 Encounters:  05/22/20 117 lb (53.1 kg)  04/28/20 116 lb 8 oz (52.8 kg)  02/28/20 122 lb (55.3 kg)     GEN:  Well nourished, well developed in no acute distress HEENT: Normal NECK: No JVD; No carotid bruits LYMPHATICS: No lymphadenopathy CARDIAC: RRR, no murmurs, rubs, gallops RESPIRATORY:  Clear to auscultation without rales, wheezing or rhonchi  ABDOMEN: Soft, non-tender, non-distended MUSCULOSKELETAL:  No edema;  No deformity  SKIN: Warm and dry NEUROLOGIC:  Alert and oriented x 3 PSYCHIATRIC:  Normal affect   ASSESSMENT:    1. Hypertension, unspecified type   2. Smoking   3. Pure hypercholesterolemia    PLAN:    In order of problems listed above:  1. Patient with history hypertension.  Blood  pressure still elevated.  Increase losartan to 50 mg daily. Plan to titrate medication as needed to help adequate blood pressure control. 2. Current smoker x50+ years.  Smoking cessation advised.  3. Patient with history of hyperlipidemia, continue statin  Follow-up in 1 month  This note was generated in part or whole with voice recognition software. Voice recognition is usually quite accurate but there are transcription errors that can and very often do occur. I apologize for any typographical errors that were not detected and corrected.  Medication Adjustments/Labs and Tests Ordered: Current medicines are reviewed at length with the patient today.  Concerns regarding medicines are outlined above.  Orders Placed This Encounter  Procedures  . EKG 12-Lead   Meds ordered this encounter  Medications  . losartan (COZAAR) 50 MG tablet    Sig: Take 1 tablet (50 mg total) by mouth daily.    Dispense:  30 tablet    Refill:  11    Patient Instructions  Medication Instructions:  1- INCREASE Losartan Take 1 tablet (50 mg total) by mouth daily *If you need a refill on your cardiac medications before your next appointment, please call your pharmacy*   Lab Work: None ordered If you have labs (blood work) drawn today and your tests are completely normal, you will receive your results only by: Marland Kitchen MyChart Message (if you have MyChart) OR . A paper copy in the mail If you have any lab test that is abnormal or we need to change your treatment, we will call you to review the results.   Testing/Procedures: None ordered   Follow-Up: At [facility], you and your health needs are our priority.  As part  of our continuing mission to provide you with exceptional heart care, we have created designated Provider Care Teams.  These Care Teams include your primary Cardiologist (physician) and Advanced Practice Providers (APPs -  Physician Assistants and Nurse Practitioners) who all work together to provide you with the care you need, when you need it.  We recommend signing up for the patient portal called "MyChart".  Sign up information is provided on this After Visit Summary.  MyChart is used to connect with patients for Virtual Visits (Telemedicine).  Patients are able to view lab/test results, encounter notes, upcoming appointments, etc.  Non-urgent messages can be sent to your provider as well.   To learn more about what you can do with MyChart, go to [email or web address].    Your next appointment:   1 month(s)  The format for your next appointment:   In Person  Provider:    You may see Kate Sable, MD or one of the following Advanced Practice Providers on your designated Care Team:    Murray Hodgkins, NP  Christell Faith, PA-C  Marrianne Mood, PA-C       Signed, Kate Sable, MD  05/22/2020 1:40 PM    [place]

## 2020-06-14 ENCOUNTER — Other Ambulatory Visit: Payer: Self-pay | Admitting: Family Medicine

## 2020-06-14 DIAGNOSIS — F325 Major depressive disorder, single episode, in full remission: Secondary | ICD-10-CM

## 2020-06-15 ENCOUNTER — Inpatient Hospital Stay (HOSPITAL_COMMUNITY)
Admission: EM | Admit: 2020-06-15 | Discharge: 2020-06-16 | DRG: 086 | Disposition: A | Discharge status: Home Health | Payer: Medicare Other | Attending: Internal Medicine | Admitting: Internal Medicine

## 2020-06-15 ENCOUNTER — Other Ambulatory Visit: Payer: Self-pay

## 2020-06-15 ENCOUNTER — Emergency Department (HOSPITAL_COMMUNITY): Payer: Medicare Other

## 2020-06-15 ENCOUNTER — Inpatient Hospital Stay (HOSPITAL_COMMUNITY): Payer: Medicare Other

## 2020-06-15 DIAGNOSIS — Y92009 Unspecified place in unspecified non-institutional (private) residence as the place of occurrence of the external cause: Secondary | ICD-10-CM | POA: Diagnosis not present

## 2020-06-15 DIAGNOSIS — I712 Thoracic aortic aneurysm, without rupture, unspecified: Secondary | ICD-10-CM | POA: Diagnosis present

## 2020-06-15 DIAGNOSIS — Z800 Family history of malignant neoplasm of digestive organs: Secondary | ICD-10-CM

## 2020-06-15 DIAGNOSIS — Z23 Encounter for immunization: Secondary | ICD-10-CM

## 2020-06-15 DIAGNOSIS — Z7982 Long term (current) use of aspirin: Secondary | ICD-10-CM | POA: Diagnosis not present

## 2020-06-15 DIAGNOSIS — Z7983 Long term (current) use of bisphosphonates: Secondary | ICD-10-CM

## 2020-06-15 DIAGNOSIS — Y92019 Unspecified place in single-family (private) house as the place of occurrence of the external cause: Secondary | ICD-10-CM

## 2020-06-15 DIAGNOSIS — S066X1A Traumatic subarachnoid hemorrhage with loss of consciousness of 30 minutes or less, initial encounter: Secondary | ICD-10-CM | POA: Diagnosis not present

## 2020-06-15 DIAGNOSIS — Z8249 Family history of ischemic heart disease and other diseases of the circulatory system: Secondary | ICD-10-CM

## 2020-06-15 DIAGNOSIS — I619 Nontraumatic intracerebral hemorrhage, unspecified: Secondary | ICD-10-CM | POA: Diagnosis present

## 2020-06-15 DIAGNOSIS — W19XXXA Unspecified fall, initial encounter: Secondary | ICD-10-CM | POA: Diagnosis present

## 2020-06-15 DIAGNOSIS — Z8679 Personal history of other diseases of the circulatory system: Secondary | ICD-10-CM

## 2020-06-15 DIAGNOSIS — W01190A Fall on same level from slipping, tripping and stumbling with subsequent striking against furniture, initial encounter: Secondary | ICD-10-CM | POA: Diagnosis not present

## 2020-06-15 DIAGNOSIS — Z853 Personal history of malignant neoplasm of breast: Secondary | ICD-10-CM

## 2020-06-15 DIAGNOSIS — I609 Nontraumatic subarachnoid hemorrhage, unspecified: Secondary | ICD-10-CM

## 2020-06-15 DIAGNOSIS — F17210 Nicotine dependence, cigarettes, uncomplicated: Secondary | ICD-10-CM | POA: Diagnosis not present

## 2020-06-15 DIAGNOSIS — G10 Huntington's disease: Secondary | ICD-10-CM | POA: Diagnosis present

## 2020-06-15 DIAGNOSIS — Z823 Family history of stroke: Secondary | ICD-10-CM | POA: Diagnosis not present

## 2020-06-15 DIAGNOSIS — R5382 Chronic fatigue, unspecified: Secondary | ICD-10-CM | POA: Diagnosis not present

## 2020-06-15 DIAGNOSIS — S0990XA Unspecified injury of head, initial encounter: Secondary | ICD-10-CM | POA: Diagnosis present

## 2020-06-15 DIAGNOSIS — Z8041 Family history of malignant neoplasm of ovary: Secondary | ICD-10-CM

## 2020-06-15 DIAGNOSIS — S06360A Traumatic hemorrhage of cerebrum, unspecified, without loss of consciousness, initial encounter: Secondary | ICD-10-CM | POA: Diagnosis not present

## 2020-06-15 DIAGNOSIS — Z20822 Contact with and (suspected) exposure to covid-19: Secondary | ICD-10-CM | POA: Diagnosis present

## 2020-06-15 DIAGNOSIS — M810 Age-related osteoporosis without current pathological fracture: Secondary | ICD-10-CM | POA: Diagnosis present

## 2020-06-15 DIAGNOSIS — Z880 Allergy status to penicillin: Secondary | ICD-10-CM | POA: Diagnosis not present

## 2020-06-15 DIAGNOSIS — J439 Emphysema, unspecified: Secondary | ICD-10-CM | POA: Diagnosis present

## 2020-06-15 DIAGNOSIS — I10 Essential (primary) hypertension: Secondary | ICD-10-CM | POA: Diagnosis present

## 2020-06-15 DIAGNOSIS — I700 Atherosclerosis of aorta: Secondary | ICD-10-CM | POA: Diagnosis not present

## 2020-06-15 DIAGNOSIS — S0181XA Laceration without foreign body of other part of head, initial encounter: Secondary | ICD-10-CM | POA: Diagnosis present

## 2020-06-15 DIAGNOSIS — E538 Deficiency of other specified B group vitamins: Secondary | ICD-10-CM | POA: Diagnosis not present

## 2020-06-15 DIAGNOSIS — Z9013 Acquired absence of bilateral breasts and nipples: Secondary | ICD-10-CM | POA: Diagnosis not present

## 2020-06-15 DIAGNOSIS — Z833 Family history of diabetes mellitus: Secondary | ICD-10-CM

## 2020-06-15 DIAGNOSIS — Z79899 Other long term (current) drug therapy: Secondary | ICD-10-CM | POA: Diagnosis not present

## 2020-06-15 DIAGNOSIS — F325 Major depressive disorder, single episode, in full remission: Secondary | ICD-10-CM | POA: Diagnosis present

## 2020-06-15 DIAGNOSIS — S0083XA Contusion of other part of head, initial encounter: Secondary | ICD-10-CM | POA: Diagnosis present

## 2020-06-15 DIAGNOSIS — Z720 Tobacco use: Secondary | ICD-10-CM | POA: Diagnosis present

## 2020-06-15 LAB — SARS CORONAVIRUS 2 BY RT PCR (HOSPITAL ORDER, PERFORMED IN ~~LOC~~ HOSPITAL LAB): SARS Coronavirus 2: NEGATIVE

## 2020-06-15 LAB — CBC
HCT: 44.6 % (ref 36.0–46.0)
Hemoglobin: 14.4 g/dL (ref 12.0–15.0)
MCH: 30.1 pg (ref 26.0–34.0)
MCHC: 32.3 g/dL (ref 30.0–36.0)
MCV: 93.1 fL (ref 80.0–100.0)
Platelets: 201 10*3/uL (ref 150–400)
RBC: 4.79 MIL/uL (ref 3.87–5.11)
RDW: 12.4 % (ref 11.5–15.5)
WBC: 9 10*3/uL (ref 4.0–10.5)
nRBC: 0 % (ref 0.0–0.2)

## 2020-06-15 LAB — CBG MONITORING, ED: Glucose-Capillary: 100 mg/dL — ABNORMAL HIGH (ref 70–99)

## 2020-06-15 LAB — TYPE AND SCREEN
ABO/RH(D): O POS
Antibody Screen: NEGATIVE

## 2020-06-15 LAB — BASIC METABOLIC PANEL
Anion gap: 8 (ref 5–15)
BUN: 19 mg/dL (ref 8–23)
CO2: 28 mmol/L (ref 22–32)
Calcium: 9.6 mg/dL (ref 8.9–10.3)
Chloride: 104 mmol/L (ref 98–111)
Creatinine, Ser: 0.86 mg/dL (ref 0.44–1.00)
GFR calc Af Amer: >60 mL/min (ref >60)
GFR calc non Af Amer: >60 mL/min (ref >60)
Glucose, Bld: 109 mg/dL — ABNORMAL HIGH (ref 70–99)
Potassium: 4.5 mmol/L (ref 3.5–5.1)
Sodium: 140 mmol/L (ref 135–145)

## 2020-06-15 MED ORDER — ACETAMINOPHEN 650 MG RE SUPP: 650.0000 mg | Freq: Four times a day (QID) | RECTAL | Status: DC | PRN

## 2020-06-15 MED ORDER — ONDANSETRON HCL 4 MG/2ML IJ SOLN
4.0000 mg | Freq: Four times a day (QID) | INTRAMUSCULAR | Status: DC | PRN
  Administered 2020-06-15: 4 mg via INTRAVENOUS
  Filled 2020-06-15: qty 2

## 2020-06-15 MED ORDER — SODIUM CHLORIDE 0.45 % IV SOLN: INTRAVENOUS | Status: DC

## 2020-06-15 MED ORDER — NICOTINE 21 MG/24HR TD PT24
21.0000 mg | MEDICATED_PATCH | Freq: Once | TRANSDERMAL | Status: AC
  Administered 2020-06-15: 21 mg via TRANSDERMAL
  Filled 2020-06-15: qty 1

## 2020-06-15 MED ORDER — TETANUS-DIPHTH-ACELL PERTUSSIS 5-2.5-18.5 LF-MCG/0.5 IM SUSP
0.5000 mL | Freq: Once | INTRAMUSCULAR | Status: AC
  Administered 2020-06-15: 0.5 mL via INTRAMUSCULAR
  Filled 2020-06-15: qty 0.5

## 2020-06-15 MED ORDER — ONDANSETRON HCL 4 MG PO TABS: 4.0000 mg | ORAL_TABLET | Freq: Four times a day (QID) | ORAL | Status: DC | PRN

## 2020-06-15 MED ORDER — ACETAMINOPHEN 325 MG PO TABS: 650.0000 mg | ORAL_TABLET | Freq: Four times a day (QID) | ORAL | Status: DC | PRN

## 2020-06-15 NOTE — H&P (Signed)
History and Physical   Barbara Krause WCB:[id number] DOB: Jul 14, 1941 DOA: 06/15/2020  Referring MD/NP/PA: Dr. Rex Kras  PCP: [name], MD   Outpatient Specialists: None  Patient coming from: Home  Chief Complaint: Fall with head injury  HPI: Barbara Krause is a 79 y.o. female with medical history significant of breast cancer, Huntington's disease, significant memory loss, hypertension, depression, previous kidney stones and osteoporosis who sustained a mechanical fall at home.  She hit the left side of her head on a small table where she sustained a laceration.  Patient is accompanied by granddaughter here.  She was evaluated in the ER her laceration was sutured however head CT showed multiple intracerebral bleeds which are small.  She is still awake and alert and there is no focal weakness.  Neurosurgery consulted with recommendation for admission to medical service overnight to follow closely with repeat scans.  Patient has been on Plavix but no other anticoagulants.  No prior intracranial bleeds..  ED Course: Temperature is 97.6 blood pressure 163/94 pulse 64 respirate of 23 oxygen sat 97% room air.  CBC and chemistry entirely within normal.  COVID-19 is negative.  CT cervical spine and head CT without contrast performed.  It shows interventricular hemorrhage within the left lateral ventricle third ventricle and fourth ventricle.  There is a small amount of subarachnoid hemorrhage overlying the lateral sulcus and overlying the left parieto-occipital lobe with no midline shift.  Neurosurgery consulted and patient is being admitted for observation.  Review of Systems: As per HPI otherwise 10 point review of systems negative.    Past Medical History:  Diagnosis Date  . Breast cancer ([place])    bilateral  . Depression   . History of chicken pox   . History of colon polyps   . 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[place])   . Kidney stone   . Osteoporosis     Past Surgical History:  Procedure Laterality  Date  . [place]  . CHOLECYSTECTOMY    . TOTAL MASTECTOMY Bilateral 2009     reports that she has been smoking cigarettes. She has a 37.50 pack-year smoking history. She has never used smokeless tobacco. She reports current alcohol use. She reports that she does not use drugs.  Allergies  Allergen Reactions  . Penicillin G Hives    Family History  Problem Relation Age of Onset  . Colon cancer Mother   . Stroke Father   . Heart disease Father   . Ovarian cancer Sister   . Healthy Daughter   . Stomach cancer Maternal Grandmother   . Diabetes Maternal Grandfather   . Colon cancer Paternal Grandmother   . Diabetes Paternal Grandfather   . Healthy Daughter      Prior to Admission medications   Medication Sig Start Date End Date Taking? Authorizing Provider  alendronate (FOSAMAX) 70 MG tablet Take 70 mg by mouth once a week.  01/30/20 01/29/21  [provider]  aspirin EC 81 MG tablet Take 1 tablet (81 mg total) by mouth daily. 09/17/19   [name], MD  atorvastatin (LIPITOR) 10 MG tablet Take 1 tablet (10 mg total) by mouth daily. 09/17/19   [name], MD  AUSTEDO 12 MG TABS Take 1 tablet by mouth 2 (two) times a day. 01/26/19   [provider]  donepezil (ARICEPT) 5 MG tablet Take 1 tablet by mouth daily.    [provider]  escitalopram (LEXAPRO) 20 MG tablet TAKE 1  TABLET BY MOUTH EVERY DAY 02/27/20   [name], MD  losartan (COZAAR) 50 MG tablet Take 1 tablet (50 mg total) by mouth daily. 05/22/20 08/20/20  Kate Sable, MD  Vitamin D, Ergocalciferol, (DRISDOL) 1.25 MG (50000 UT) CAPS capsule Take 1 capsule (50,000 Units total) by mouth every 7 (seven) days. Due for follow up in November. Please schedule 09/17/19   [name], MD    Physical Exam: Vitals:   06/15/20 1452 06/15/20 1658 06/15/20 1900  BP: (!) 148/90 (!) 149/90 (!) 163/94  Pulse: 62 (!) 52 64  Resp: 18 18 (!) 23  Temp: 97.6 F  (36.4 C)    TempSrc: Oral    SpO2: 98% 97% 100%  Weight: 82.6 kg    Height: 5\' 7"  (1.702 m)        Constitutional: Awake but slightly agitated beginning to be confused Vitals:   06/15/20 1452 06/15/20 1658 06/15/20 1900  BP: (!) 148/90 (!) 149/90 (!) 163/94  Pulse: 62 (!) 52 64  Resp: 18 18 (!) 23  Temp: 97.6 F (36.4 C)    TempSrc: Oral    SpO2: 98% 97% 100%  Weight: 82.6 kg    Height: 5\' 7"  (1.702 m)     Eyes: PERRL, lids and conjunctivae normal ENMT: Mucous membranes are moist. Posterior pharynx clear of any exudate or lesions.Normal dentition.  An area of the laceration the left forehead currently sutured Neck: normal, supple, no masses, no thyromegaly Respiratory: clear to auscultation bilaterally, no wheezing, no crackles. Normal respiratory effort. No accessory muscle use.  Cardiovascular: Regular rate and rhythm, no murmurs / rubs / gallops. No extremity edema. 2+ pedal pulses. No carotid bruits.  Abdomen: no tenderness, no masses palpated. No hepatosplenomegaly. Bowel sounds positive.  Musculoskeletal: no clubbing / cyanosis. No joint deformity upper and lower extremities. Good ROM, no contractures. Normal muscle tone.  Skin: no rashes, lesions, ulcers. No induration Neurologic: CN 2-12 grossly intact. Sensation intact, DTR normal. Strength 5/5 in all 4.  Involuntary movement with some agitation Psychiatric: Confused but communicating.     Labs on Admission: I have personally reviewed following labs and imaging studies  CBC: Recent Labs  Lab 06/15/20 1630  WBC 9.0  HGB 14.4  HCT 44.6  MCV 93.1  PLT 829   Basic Metabolic Panel: Recent Labs  Lab 06/15/20 1630  NA 140  K 4.5  CL 104  CO2 28  GLUCOSE 109*  BUN 19  CREATININE 0.86  CALCIUM 9.6   GFR: Estimated Creatinine Clearance: 61.5 mL/min (by C-G formula based on SCr of 0.86 mg/dL). Liver Function Tests: No results for input(s): AST, ALT, ALKPHOS, BILITOT, PROT, ALBUMIN in the last 168  hours. No results for input(s): LIPASE, AMYLASE in the last 168 hours. No results for input(s): AMMONIA in the last 168 hours. Coagulation Profile: No results for input(s): INR, PROTIME in the last 168 hours. Cardiac Enzymes: No results for input(s): CKTOTAL, CKMB, CKMBINDEX, TROPONINI in the last 168 hours. BNP (last 3 results) No results for input(s): PROBNP in the last 8760 hours. HbA1C: No results for input(s): HGBA1C in the last 72 hours. CBG: Recent Labs  Lab 06/15/20 1527  GLUCAP 100*   Lipid Profile: No results for input(s): CHOL, HDL, LDLCALC, TRIG, CHOLHDL, LDLDIRECT in the last 72 hours. Thyroid Function Tests: No results for input(s): TSH, T4TOTAL, FREET4, T3FREE, THYROIDAB in the last 72 hours. Anemia Panel: No results for input(s): VITAMINB12, FOLATE, FERRITIN, TIBC, IRON, RETICCTPCT in the last 72  hours. Urine analysis:    Component Value Date/Time   APPEARANCEUR Clear 07/24/2019 1609   GLUCOSEU Negative 07/24/2019 1609   BILIRUBINUR Negative 07/24/2019 1609   PROTEINUR Negative 07/24/2019 1609   UROBILINOGEN 0.2 06/11/2019 1442   NITRITE Negative 07/24/2019 1609   LEUKOCYTESUR Negative 07/24/2019 1609   Sepsis Labs: @LABRCNTIP (procalcitonin:4,lacticidven:4) ) Recent Results (from the past 240 hour(s))  SARS Coronavirus 2 by RT PCR (hospital order, performed in [place] hospital lab) Nasopharyngeal Nasopharyngeal Swab     Status: None   Collection Time: 06/15/20  4:45 PM   Specimen: Nasopharyngeal Swab  Result Value Ref Range Status   SARS Coronavirus 2 NEGATIVE NEGATIVE Final    Comment: (NOTE) SARS-CoV-2 target nucleic acids are NOT DETECTED.  The SARS-CoV-2 RNA is generally detectable in upper and lower respiratory specimens during the acute phase of infection. The lowest concentration of SARS-CoV-2 viral copies this assay can detect is 250 copies / mL. A negative result does not preclude SARS-CoV-2 infection and should not be used as the sole  basis for treatment or other patient management decisions.  A negative result may occur with improper specimen collection / handling, submission of specimen other than nasopharyngeal swab, presence of viral mutation(s) within the areas targeted by this assay, and inadequate number of viral copies (<250 copies / mL). A negative result must be combined with clinical observations, patient history, and epidemiological information.  Fact Sheet for Patients:   StrictlyIdeas.no  Fact Sheet for Healthcare Providers: BankingDealers.co.za  This test is not yet approved or  cleared by the [place] FDA and has been authorized for detection and/or diagnosis of SARS-CoV-2 by FDA under an Emergency Use Authorization (EUA).  This EUA will remain in effect (meaning this test can be used) for the duration of the COVID-19 declaration under Section 564(b)(1) of the Act, 21 U.S.C. section 360bbb-3(b)(1), unless the authorization is terminated or revoked sooner.  Performed at [facility], [place] [address] [place], [place] [address]      Radiological Exams on Admission: CT Head Wo Contrast  Result Date: 06/15/2020 CLINICAL DATA:  Fall hitting left side of head EXAM: CT head and CERVICAL SPINE WITHOUT CONTRAST TECHNIQUE: Multidetector CT imaging of the cervical spine was performed without intravenous contrast. Multiplanar CT image reconstructions were also generated. COMPARISON:  None. FINDINGS: Brain: Left intraventricular hemorrhage is seen within the left lateral ventricle, third ventricle, fourth ventricle, and left pontocerebellar cistern. There is a small amount of subarachnoid hemorrhage within the left lateral sulcus and overlying the left parieto-occipital lobe. No midline shift is seen. There is dilatation the ventricles and sulci consistent with age -related atrophy. Low-attenuation changes in the deep white matter consistent with small vessel  ischemia. Vascular: No hyperdense vessel or unexpected calcification. Skull: The skull is intact. Small soft tissue hematoma overlying the frontal skull. Sinuses/Orbits: The visualized paranasal sinuses and mastoid air cells are clear. The orbits and globes intact. Other: None Cervical spine: Alignment: Physiologic Skull base and vertebrae: Visualized skull base is intact. No atlanto-occipital dissociation. The vertebral body heights are well maintained. No fracture or pathologic osseous lesion seen. Soft tissues and spinal canal: The visualized paraspinal soft tissues are unremarkable. No prevertebral soft tissue swelling is seen. The spinal canal is grossly unremarkable, no large epidural collection or significant canal narrowing. Disc levels: Multilevel cervical spine spondylosis is seen at C5-C6 with disc osteophyte complex and uncovertebral osteophytes causes severe left and moderate right neural foraminal narrowing and mild central canal Upper chest:  Biapical scarring is seen. Thoracic inlet is within normal limits. Other: None IMPRESSION: Interventricular hemorrhage within the left lateral ventricle, third ventricle and fourth ventricle. A small amount subarachnoid hemorrhage overlying the lateral sulcus and overlying the left parieto-occipital lobe. No midline shift. Findings consistent with age related atrophy and chronic small vessel ischemia No acute fracture or malalignment. These results were called by telephone at the time of interpretation on 06/15/2020 at 4:22 pm to provider RACHEL LITTLE , who verbally acknowledged these results. Electronically Signed   By: Prudencio Pair M.D.   On: 06/15/2020 16:10   CT Cervical Spine Wo Contrast  Result Date: 06/15/2020 CLINICAL DATA:  Fall hitting left side of head EXAM: CT head and CERVICAL SPINE WITHOUT CONTRAST TECHNIQUE: Multidetector CT imaging of the cervical spine was performed without intravenous contrast. Multiplanar CT image reconstructions were also  generated. COMPARISON:  None. FINDINGS: Brain: Left intraventricular hemorrhage is seen within the left lateral ventricle, third ventricle, fourth ventricle, and left pontocerebellar cistern. There is a small amount of subarachnoid hemorrhage within the left lateral sulcus and overlying the left parieto-occipital lobe. No midline shift is seen. There is dilatation the ventricles and sulci consistent with age -related atrophy. Low-attenuation changes in the deep white matter consistent with small vessel ischemia. Vascular: No hyperdense vessel or unexpected calcification. Skull: The skull is intact. Small soft tissue hematoma overlying the frontal skull. Sinuses/Orbits: The visualized paranasal sinuses and mastoid air cells are clear. The orbits and globes intact. Other: None Cervical spine: Alignment: Physiologic Skull base and vertebrae: Visualized skull base is intact. No atlanto-occipital dissociation. The vertebral body heights are well maintained. No fracture or pathologic osseous lesion seen. Soft tissues and spinal canal: The visualized paraspinal soft tissues are unremarkable. No prevertebral soft tissue swelling is seen. The spinal canal is grossly unremarkable, no large epidural collection or significant canal narrowing. 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 Thoracic inlet is within normal limits. Other: None IMPRESSION: Interventricular hemorrhage within the left lateral ventricle, third ventricle and fourth ventricle. A small amount subarachnoid hemorrhage overlying the lateral sulcus and overlying the left parieto-occipital lobe. No midline shift. Findings consistent with age related atrophy and chronic small vessel ischemia No acute fracture or malalignment. These results were called by  telephone at the time of interpretation on 06/15/2020 at 4:22 pm to provider RACHEL LITTLE , who verbally acknowledged these results. Electronically Signed   By: Prudencio Pair M.D.   On: 06/15/2020 16:10      Assessment/Plan Principal Problem:   Intracerebral hemorrhage (HCC) Active Problems:   HTN (hypertension)   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HCC)   Tobacco use   Depression, major, single episode, complete remission (HCC)   Hx of breast cancer   Aneurysm of thoracic aorta (HCC)   Forehead contusion   Accidental fall   SAH (subarachnoid hemorrhage) ([place])     #1 multiple intracerebral hemorrhage: Patient has subarachnoid hemorrhage and other intraventricular hemorrhages.  It is small in nature.  Due to patient being on Plavix however she is still at risk for bleeding.  We will admit her repeat head scan in the morning.  Neurochecks every 2 hours.  Admit to progressive care unit.  #2  Status post fall: Mechanical in nature.  Patient has gait abnormalities from her Huntington's chorea.  Continue PT with OT once stable  #  3 subarachnoid hemorrhage: Part of the multiple intracerebral hemorrhages.  Repeat head CT due to slight altered mental status.  #4 Huntington's chorea: Stable on continue home regimen  #5 tobacco abuse: Nicotine patch.  Tobacco cessation counseling  #6 hypertension: Continue blood pressure control.  #7 history of thoracic aortic aneurysm: Stable at this point.  Follow-up with regular vascular surgery  #8 history of breast cancer: Stable.  Continue outpatient follow-up with oncology.   DVT prophylaxis: SCD Code Status: Full code Family Communication: Daughter at bedside Disposition Plan: To be determined Consults called: Dr. Vertell Limber, neurosurgery Admission status: Inpatient  Severity of Illness: The appropriate patient status for this patient is INPATIENT. Inpatient status is judged to be reasonable and necessary in order to provide the required intensity of service to  ensure the patient's safety. The patient's presenting symptoms, physical exam findings, and initial radiographic and laboratory data in the context of their chronic comorbidities is felt to place them at high risk for further clinical deterioration. Furthermore, it is not anticipated that the patient will be medically stable for discharge from the hospital within 2 midnights of admission. The following factors support the patient status of inpatient.   " The patient's presenting symptoms include fall with forehead laceration. " The worrisome physical exam findings include mild altered mental status with visible laceration of the forehead. " The initial radiographic and laboratory data are worrisome because of evidence of multiple intracranial bleeds. " The chronic co-morbidities include hypertension and Huntington's chorea.   * I certify that at the point of admission it is my clinical judgment that the patient will require inpatient hospital care spanning beyond 2 midnights from the point of admission due to high intensity of service, high risk for further deterioration and high frequency of surveillance required.Barbette Merino MD [facility] Pager [phone]  If 7PM-7AM, please contact night-coverage [email or web address] Password [facility]  06/15/2020, 7:05 PM

## 2020-06-15 NOTE — ED Notes (Signed)
Per ems states daughter from [place] will be coming

## 2020-06-15 NOTE — Consult Note (Signed)
Reason for Consult: [facility] Referring Physician: Sharlett Iles, MD   HPI: Barbara Krause is an 79 y.o. female with a past medical history significant for Huntington's disease w/ involuntary movements (followed at the movement disorder clinic at [facility], last seen in March), balance problems and memory loss; breast cancer, and HTN. The patient was stated to have fallen earlier today while at home and struck a table with the left side of her head. She sustained a laceration to her left forehead and is amnestic to the fall. She denies headache, N/V, nuchal rigidity, photophobia, diplopia, weakness, or dysarthria. Her laceration was sutured in the ED. However, her head CT showed a small amount of intraventricular hemorrhage as well as small subarachnoid hemorrhage and subsequently led to a neurosurgical consult.   Past Medical History:  Diagnosis Date  . Breast cancer ([place])    bilateral  . Depression   . History of chicken pox   . History of colon polyps   . 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[place])   . Kidney stone   . Osteoporosis     Past Surgical History:  Procedure Laterality Date  . [place]  . CHOLECYSTECTOMY    . TOTAL MASTECTOMY Bilateral 2009    Family History  Problem Relation Age of Onset  . Colon cancer Mother   . Stroke Father   . Heart disease Father   . Ovarian cancer Sister   . Healthy Daughter   . Stomach cancer Maternal Grandmother   . Diabetes Maternal Grandfather   . Colon cancer Paternal Grandmother   . Diabetes Paternal Grandfather   . Healthy Daughter     Social History:  reports that she has been smoking cigarettes. She has a 37.50 pack-year smoking history. She has never used smokeless tobacco. She reports current alcohol use. She reports that she does not use drugs.  Allergies:  Allergies  Allergen Reactions  . Penicillin G Hives    Medications: I have reviewed the patient's current medications.  Results for orders placed or  performed during the hospital encounter of 06/15/20 (from the past 48 hour(s))  CBG monitoring, ED     Status: Abnormal   Collection Time: 06/15/20  3:27 PM  Result Value Ref Range   Glucose-Capillary 100 (H) 70 - 99 mg/dL    Comment: Glucose reference range applies only to samples taken after fasting for at least 8 hours.   Comment 1 Notify RN    Comment 2 Document in Chart   Basic metabolic panel     Status: Abnormal   Collection Time: 06/15/20  4:30 PM  Result Value Ref Range   Sodium 140 135 - 145 mmol/L   Potassium 4.5 3.5 - 5.1 mmol/L   Chloride 104 98 - 111 mmol/L   CO2 28 22 - 32 mmol/L   Glucose, Bld 109 (H) 70 - 99 mg/dL    Comment: Glucose reference range applies only to samples taken after fasting for at least 8 hours.   BUN 19 8 - 23 mg/dL   Creatinine, Ser 0.86 0.44 - 1.00 mg/dL   Calcium 9.6 8.9 - 10.3 mg/dL   GFR calc non Af Amer >60 >60 mL/min   GFR calc Af Amer >60 >60 mL/min   Anion gap 8 5 - 15    Comment: Performed at [facility] [address], [place], [place] [address]  CBC     Status: None   Collection Time: 06/15/20  4:30 PM  Result Value Ref Range   WBC 9.0 4.0 - 10.5 K/uL   RBC 4.79 3.87 - 5.11 MIL/uL   Hemoglobin 14.4 12.0 - 15.0 g/dL   HCT 44.6 36 - 46 %   MCV 93.1 80.0 - 100.0 fL   MCH 30.1 26.0 - 34.0 pg   MCHC 32.3 30.0 - 36.0 g/dL   RDW 12.4 11.5 - 15.5 %   Platelets 201 150 - 400 K/uL   nRBC 0.0 0.0 - 0.2 %    Comment: Performed at [facility] [address] [place], [place] [address]  Type and screen [place]     Status: None   Collection Time: 06/15/20  4:30 PM  Result Value Ref Range   ABO/RH(D) O POS    Antibody Screen NEG    Sample Expiration      06/18/2020,2359 Performed at [facility], [place] [address] [place], [place] [address]   SARS Coronavirus 2 by RT PCR (hospital order, performed in [facility] hospital lab) Nasopharyngeal Nasopharyngeal Swab     Status: None   Collection Time:  06/15/20  4:45 PM   Specimen: Nasopharyngeal Swab  Result Value Ref Range   SARS Coronavirus 2 NEGATIVE NEGATIVE    Comment: (NOTE) SARS-CoV-2 target nucleic acids are NOT DETECTED.  The SARS-CoV-2 RNA is generally detectable in upper and lower respiratory specimens during the acute phase of infection. The lowest concentration of SARS-CoV-2 viral copies this assay can detect is 250 copies / mL. A negative result does not preclude SARS-CoV-2 infection and should not be used as the sole basis for treatment or other patient management decisions.  A negative result may occur with improper specimen collection / handling, submission of specimen other than nasopharyngeal swab, presence of viral mutation(s) within the areas targeted by this assay, and inadequate number of viral copies (<250 copies / mL). A negative result must be combined with clinical observations, patient history, and epidemiological information.  Fact Sheet for Patients:   StrictlyIdeas.no  Fact Sheet for Healthcare Providers: BankingDealers.co.za  This test is not yet approved or  cleared by the [place] FDA and has been authorized for detection and/or diagnosis of SARS-CoV-2 by FDA under an Emergency Use Authorization (EUA).  This EUA will remain in effect (meaning this test can be used) for the duration of the COVID-19 declaration under Section 564(b)(1) of the Act, 21 U.S.C. section 360bbb-3(b)(1), unless the authorization is terminated or revoked sooner.  Performed at [facility], [place] [address], [place], [place] [address]     CT HEAD WO CONTRAST  Result Date: 06/15/2020 CLINICAL DATA:  Follow-up subarachnoid hemorrhage. EXAM: CT HEAD WITHOUT CONTRAST TECHNIQUE: Contiguous axial images were obtained from the base of the skull through the vertex without intravenous contrast. COMPARISON:  Head CT earlier today 1540 hour FINDINGS: Brain: Again seen  intraventricular hemorrhage primarily involving the left lateral ventricle. Small amount of hemorrhage in the dependent occipital horn of the right lateral ventricle, new from prior. Small amount of blood in the third and fourth ventricles again seen, unchanged. Overall volume of intraventricular blood appears similar to minimally increased. Small volume of subarachnoid hemorrhage in the left occipital lobe without progression. Punctate intraparenchymal hemorrhage in the anterior left frontal lobe, series 3, image 25 and series 5, image 18, unchanged. No surrounding edema or mass effect. No subdural blood. No new hemorrhage. Stable ventricular size without hydrocephalus. Basilar cisterns remain patent. Chronic small vessel ischemia  is again seen. Vascular: No hyperdense vessel. Skull: No skull fracture. Sinuses/Orbits: Occasional opacification of left mastoid air cells. This is unchanged. Paranasal sinuses are clear. Other: Stable small scalp hematoma. IMPRESSION: 1. Unchanged to minimally increased intraventricular hemorrhage primarily involving the left lateral ventricle. Small amount of hemorrhage is now present in the dependent occipital horn of the right lateral ventricle, new from prior, but likely re-distributed. Stable ventricular size without hydrocephalus. 2. Stable small volume of subarachnoid hemorrhage in the left occipital lobe without progression. Unchanged punctate intraparenchymal hemorrhage in the anterior left frontal lobe. Electronically Signed   By: Keith Rake M.D.   On: 06/15/2020 21:04   CT Head Wo Contrast  Result Date: 06/15/2020 CLINICAL DATA:  Fall hitting left side of head EXAM: CT head and CERVICAL SPINE WITHOUT CONTRAST TECHNIQUE: Multidetector CT imaging of the cervical spine was performed without intravenous contrast. Multiplanar CT image reconstructions were also generated. COMPARISON:  None. FINDINGS: Brain: Left intraventricular hemorrhage is seen within the left lateral  ventricle, third ventricle, fourth ventricle, and left pontocerebellar cistern. There is a small amount of subarachnoid hemorrhage within the left lateral sulcus and overlying the left parieto-occipital lobe. No midline shift is seen. There is dilatation the ventricles and sulci consistent with age -related atrophy. Low-attenuation changes in the deep white matter consistent with small vessel ischemia. Vascular: No hyperdense vessel or unexpected calcification. Skull: The skull is intact. Small soft tissue hematoma overlying the frontal skull. Sinuses/Orbits: The visualized paranasal sinuses and mastoid air cells are clear. The orbits and globes intact. Other: None Cervical spine: Alignment: Physiologic Skull base and vertebrae: Visualized skull base is intact. No atlanto-occipital dissociation. The vertebral body heights are well maintained. No fracture or pathologic osseous lesion seen. Soft tissues and spinal canal: The visualized paraspinal soft tissues are unremarkable. No prevertebral soft tissue swelling is seen. The spinal canal is grossly unremarkable, no large epidural collection or significant canal narrowing. 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 Thoracic inlet is within normal limits. Other: None IMPRESSION: Interventricular hemorrhage within the left lateral ventricle, third ventricle and fourth ventricle. A small amount subarachnoid hemorrhage overlying the lateral sulcus and overlying the left parieto-occipital lobe. No midline shift. Findings consistent with age related atrophy and chronic small vessel ischemia No acute fracture or malalignment. These results were called by telephone at the time of interpretation on 06/15/2020 at 4:22 pm to provider RACHEL LITTLE , who verbally  acknowledged these results. Electronically Signed   By: Prudencio Pair M.D.   On: 06/15/2020 16:10   CT Cervical Spine Wo Contrast  Result Date: 06/15/2020 CLINICAL DATA:  Fall hitting left side of head EXAM: CT head and CERVICAL SPINE WITHOUT CONTRAST TECHNIQUE: Multidetector CT imaging of the cervical spine was performed without intravenous contrast. Multiplanar CT image reconstructions were also generated. COMPARISON:  None. FINDINGS: Brain: Left intraventricular hemorrhage is seen within the left lateral ventricle, third ventricle, fourth ventricle, and left pontocerebellar cistern. There is a small amount of subarachnoid hemorrhage within the left lateral sulcus and overlying the left parieto-occipital lobe. No midline shift is seen. There is dilatation the ventricles and sulci consistent with age -related atrophy. Low-attenuation changes in the deep white matter consistent with small vessel ischemia. Vascular: No hyperdense vessel or unexpected calcification. Skull: The skull is intact. Small soft tissue hematoma overlying the  frontal skull. Sinuses/Orbits: The visualized paranasal sinuses and mastoid air cells are clear. The orbits and globes intact. Other: None Cervical spine: Alignment: Physiologic Skull base and vertebrae: Visualized skull base is intact. No atlanto-occipital dissociation. The vertebral body heights are well maintained. No fracture or pathologic osseous lesion seen. Soft tissues and spinal canal: The visualized paraspinal soft tissues are unremarkable. No prevertebral soft tissue swelling is seen. The spinal canal is grossly unremarkable, no large epidural collection or significant canal narrowing. 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 Thoracic inlet is within normal limits.  Other: None IMPRESSION: Interventricular hemorrhage within the left lateral ventricle, third ventricle and fourth ventricle. A small amount subarachnoid hemorrhage overlying the lateral sulcus and overlying the left parieto-occipital lobe. No midline shift. Findings consistent with age related atrophy and chronic small vessel ischemia No acute fracture or malalignment. These results were called by telephone at the time of interpretation on 06/15/2020 at 4:22 pm to provider RACHEL LITTLE , who verbally acknowledged these results. Electronically Signed   By: Prudencio Pair M.D.   On: 06/15/2020 16:10    Review of Systems Blood pressure (!) 144/91, pulse 72, temperature 97.6 F (36.4 C), temperature source Oral, resp. rate 17, height 5\' 7"  (1.702 m), weight 82.6 kg, SpO2 94 %. Physical Exam  HEENT: 1.5 cm linear laceration lateral to L eye, no hematoma, no active bleeding  Neurologic:   Patient is A/O X2 and conversant. Follows commands without difficulty. Recwnt memory is not intact.  Motor: Right :  Upper extremity   5/5                                      Left:     Upper extremity   5/5             Lower extremity   5/5                                                  Lower extremity   5/5  Sensory: Pinprick and light touch intact throughout, bilaterally, left-sided paresthesia Plantars: Right: downgoing                                Left: downgoing Cerebellar: normal finger-to-nose, normal rapid alternating movements and normal heel-to-shin test   Cranial Nerves:  II: Visual acuity normal; peripheral visual fields intact by confrontation; fundi normal.   III, IV, VI: EOMs intact, no ptosis or nystagmus; pupils equal, round, reactive to light and accommodation.  V: Sensation intact and equal bilaterally to pinprick and light touch; jaw strength equal bilaterally.  VII: Facial muscles intact and symmetric.  IX: Shoulder shrug, head movement intact and equal bilaterally.  XII: Tongue  protrudes midline, no tremors.    Assessment/Plan: CT head shows a small amount of intraventricular hemorrhage as well as small subarachnoid hemorrhage, with no midline shift. The patient is mildly confused but is awake, alert, and following commands. I have recommended admission to the medical service with close observation and frequent neuro checks overnight as  the patient is at high risk for neurological deterioration. Follow up CT head in the morning or sooner if neurological decline. If follow up CT head is stable, the patient is ok to discharge from a neurosurgical perspective.   Marvis Moeller 06/15/2020, 9:45 PM

## 2020-06-15 NOTE — ED Triage Notes (Signed)
Patient presents to ed via [facility] states patient was at home and had a fall unsure why she fell, hitting the left side of the head on a small table. Small laceration beside left eye. Patient is alert oriented to name and birthdate asking repetative questions , doesn't remember falling.

## 2020-06-15 NOTE — ED Notes (Signed)
Patient transported to CT 

## 2020-06-15 NOTE — ED Provider Notes (Signed)
[facility] EMERGENCY DEPARTMENT Provider Note   CSN: [id number] Arrival date & time: 06/15/20  1444     History Chief Complaint  Patient presents with  . Fall    Barbara Krause is a 79 y.o. female.  79yo F w/ PMH including Huntington's disease w/ involuntary movements, balance problems and memory loss; breast cancer, HTN, depression who p/w fall. Per EMS, pt had a fall at home during which she hit the left side of her head on a small table, sustaining a laceration. She has had repetitive questioning and amnesia to event, does tell me she remembers being in ambulance but doesn't remember falling. She denies any vision changes, headache, neck or back pain, chest or abd pain, extremity injury, or nausea.   Chart review shows last tdap 06/02/2015.  LEVEL 5 CAVEAT DUE TO MEMORY PROBLEMS  The history is provided by the patient and the EMS personnel.  Fall       Past Medical History:  Diagnosis Date  . Breast cancer ([place])    bilateral  . Depression   . History of chicken pox   . History of colon polyps   . 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[place])   . Kidney stone   . Osteoporosis     Patient Active Problem List   Diagnosis Date Noted  . Closed fracture of upper end of left ulna 02/28/2020  . Forehead contusion 02/28/2020  . Accidental fall 02/28/2020  . Aortic atherosclerosis ([place]) 08/30/2019  . 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[place]) 08/30/2019  . Microscopic hematuria 04/02/2019  . Chronic fatigue syndrome 04/02/2019  . Osteoporosis 04/02/2019  . Vitamin B12 deficiency 04/02/2019  . 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 HTN (hypertension) 03/05/2019  . 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[place]) 03/05/2019  . Tobacco use 03/05/2019  . Depression, major, single episode, complete remission ([place]) 03/05/2019  . Hx of breast cancer 03/05/2019  . S/P bilateral mastectomy 03/05/2019    Past Surgical History:  Procedure Laterality Date  . [place]  .  CHOLECYSTECTOMY    . TOTAL MASTECTOMY Bilateral 2009     OB History   No obstetric history on file.     Family History  Problem Relation Age of Onset  . Colon cancer Mother   . Stroke Father   . Heart disease Father   . Ovarian cancer Sister   . Healthy Daughter   . Stomach cancer Maternal Grandmother   . Diabetes Maternal Grandfather   . Colon cancer Paternal Grandmother   . Diabetes Paternal Grandfather   . Healthy Daughter     Social History   Tobacco Use  . Smoking status: Current Every Day Smoker    Packs/day: 0.75    Years: 50.00    Pack years: 37.50    Types: Cigarettes  . Smokeless tobacco: Never Used  Vaping Use  . Vaping Use: Never used  Substance Use Topics  . Alcohol use: Yes    Comment: about 1 glass of wine x 2 a week  . Drug use: Never    Home Medications Prior to Admission medications   Medication Sig Start Date End Date Taking? Authorizing Provider  alendronate (FOSAMAX) 70 MG tablet Take 70 mg by mouth once a week.  01/30/20 01/29/21  [provider]  aspirin EC 81 MG tablet Take 1 tablet (81 mg total) by mouth daily. 09/17/19   [name], MD  atorvastatin (LIPITOR) 10 MG tablet Take 1 tablet (10 mg total)  by mouth daily. 09/17/19   [name], MD  AUSTEDO 12 MG TABS Take 1 tablet by mouth 2 (two) times a day. 01/26/19   [provider]  donepezil (ARICEPT) 5 MG tablet Take 1 tablet by mouth daily.    [provider]  escitalopram (LEXAPRO) 20 MG tablet TAKE 1 TABLET BY MOUTH EVERY DAY 02/27/20   [name], MD  losartan (COZAAR) 50 MG tablet Take 1 tablet (50 mg total) by mouth daily. 05/22/20 08/20/20  Kate Sable, MD  Vitamin D, Ergocalciferol, (DRISDOL) 1.25 MG (50000 UT) CAPS capsule Take 1 capsule (50,000 Units total) by mouth every 7 (seven) days. Due for follow up in November. Please schedule 09/17/19   [name], MD    Allergies    Penicillin g  Review of Systems   Review of Systems    Unable to perform ROS: Dementia    Physical Exam Updated Vital Signs BP (!) 149/90   Pulse (!) 52   Temp 97.6 F (36.4 C) (Oral)   Resp 18   Ht 5\' 7"  (1.702 m)   Wt 82.6 kg   SpO2 97%   BMI 28.51 kg/m   Physical Exam Vitals and nursing note reviewed.  Constitutional:      General: She is not in acute distress.    Appearance: She is well-developed.  HENT:     Head: Normocephalic.     Comments: 1.5 cm linear laceration lateral to L eye, no hematoma, no active bleeding    Nose: Nose normal.  Eyes:     Conjunctiva/sclera: Conjunctivae normal.     Pupils: Pupils are equal, round, and reactive to light.  Cardiovascular:     Rate and Rhythm: Normal rate and regular rhythm.     Heart sounds: Normal heart sounds. No murmur heard.   Pulmonary:     Effort: Pulmonary effort is normal.     Breath sounds: Normal breath sounds.  Abdominal:     General: Bowel sounds are normal. There is no distension.     Palpations: Abdomen is soft.     Tenderness: There is no abdominal tenderness.  Musculoskeletal:     Cervical back: Neck supple.  Skin:    General: Skin is warm and dry.  Neurological:     Mental Status: She is alert.     Cranial Nerves: No cranial nerve deficit.     Sensory: No sensory deficit.     Motor: No weakness.     Comments: Fluent speech, following commands, oriented x 2, repetitive questioning; chorea L arm  Psychiatric:        Mood and Affect: Mood normal.     ED Results / Procedures / Treatments   Labs (all labs ordered are listed, but only abnormal results are displayed) Labs Reviewed  BASIC METABOLIC PANEL - Abnormal; Notable for the following components:      Result Value   Glucose, Bld 109 (*)    All other components within normal limits  CBG MONITORING, ED - Abnormal; Notable for the following components:   Glucose-Capillary 100 (*)    All other components within normal limits  SARS CORONAVIRUS 2 BY RT PCR (HOSPITAL ORDER, [facility])  CBC  TYPE AND SCREEN  ABO/RH    EKG None  Radiology CT Head Wo Contrast  Result Date: 06/15/2020 CLINICAL DATA:  Fall hitting left side of head EXAM: CT head and CERVICAL SPINE WITHOUT CONTRAST TECHNIQUE: Multidetector CT imaging of  the cervical spine was performed without intravenous contrast. Multiplanar CT image reconstructions were also generated. COMPARISON:  None. FINDINGS: Brain: Left intraventricular hemorrhage is seen within the left lateral ventricle, third ventricle, fourth ventricle, and left pontocerebellar cistern. There is a small amount of subarachnoid hemorrhage within the left lateral sulcus and overlying the left parieto-occipital lobe. No midline shift is seen. There is dilatation the ventricles and sulci consistent with age -related atrophy. Low-attenuation changes in the deep white matter consistent with small vessel ischemia. Vascular: No hyperdense vessel or unexpected calcification. Skull: The skull is intact. Small soft tissue hematoma overlying the frontal skull. Sinuses/Orbits: The visualized paranasal sinuses and mastoid air cells are clear. The orbits and globes intact. Other: None Cervical spine: Alignment: Physiologic Skull base and vertebrae: Visualized skull base is intact. No atlanto-occipital dissociation. The vertebral body heights are well maintained. No fracture or pathologic osseous lesion seen. Soft tissues and spinal canal: The visualized paraspinal soft tissues are unremarkable. No prevertebral soft tissue swelling is seen. The spinal canal is grossly unremarkable, no large epidural collection or significant canal narrowing. 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 Thoracic inlet is within normal limits. Other: None IMPRESSION:  Interventricular hemorrhage within the left lateral ventricle, third ventricle and fourth ventricle. A small amount subarachnoid hemorrhage overlying the lateral sulcus and overlying the left parieto-occipital lobe. No midline shift. Findings consistent with age related atrophy and chronic small vessel ischemia No acute fracture or malalignment. These results were called by telephone at the time of interpretation on 06/15/2020 at 4:22 pm to provider Abriel Hattery , who verbally acknowledged these results. Electronically Signed   By: Prudencio Pair M.D.   On: 06/15/2020 16:10   CT Cervical Spine Wo Contrast  Result Date: 06/15/2020 CLINICAL DATA:  Fall hitting left side of head EXAM: CT head and CERVICAL SPINE WITHOUT CONTRAST TECHNIQUE: Multidetector CT imaging of the cervical spine was performed without intravenous contrast. Multiplanar CT image reconstructions were also generated. COMPARISON:  None. FINDINGS: Brain: Left intraventricular hemorrhage is seen within the left lateral ventricle, third ventricle, fourth ventricle, and left pontocerebellar cistern. There is a small amount of subarachnoid hemorrhage within the left lateral sulcus and overlying the left parieto-occipital lobe. No midline shift is seen. There is dilatation the ventricles and sulci consistent with age -related atrophy. Low-attenuation changes in the deep white matter consistent with small vessel ischemia. Vascular: No hyperdense vessel or unexpected calcification. Skull: The skull is intact. Small soft tissue hematoma overlying the frontal skull. Sinuses/Orbits: The visualized paranasal sinuses and mastoid air cells are clear. The orbits and globes intact. Other: None Cervical spine: Alignment: Physiologic Skull base and vertebrae: Visualized skull base is intact. No atlanto-occipital dissociation. The vertebral body heights are well maintained. No fracture or pathologic osseous lesion seen. Soft tissues and spinal canal: The visualized  paraspinal soft tissues are unremarkable. No prevertebral soft tissue swelling is seen. The spinal canal is grossly unremarkable, no large epidural collection or significant canal narrowing. 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 Thoracic inlet is within normal limits. Other: None IMPRESSION: Interventricular hemorrhage within the left lateral ventricle, third ventricle and fourth ventricle. A small amount  subarachnoid hemorrhage overlying the lateral sulcus and overlying the left parieto-occipital lobe. No midline shift. Findings consistent with age related atrophy and chronic small vessel ischemia No acute fracture or malalignment. These results were called by telephone at the time of interpretation on 06/15/2020 at 4:22 pm to provider Aviyah Swetz , who verbally acknowledged these results. Electronically Signed   By: Prudencio Pair M.D.   On: 06/15/2020 16:10    Procedures .Marland KitchenLaceration Repair  Date/Time: 06/15/2020 6:27 PM Performed by: Sharlett Iles, MD Authorized by: Sharlett Iles, MD   Consent:    Consent obtained:  Verbal   Consent given by:  Patient   Risks discussed:  Poor cosmetic result, pain and need for additional repair   Alternatives discussed:  No treatment Anesthesia (see MAR for exact dosages):    Anesthesia method:  None Laceration details:    Location:  Face   Face location:  Forehead   Length (cm):  1.5 Repair type:    Repair type:  Simple Treatment:    Irrigation solution:  Sterile saline   Irrigation method:  Syringe Skin repair:    Repair method:  Tissue adhesive Approximation:    Approximation:  Close Post-procedure details:    Dressing:  Open (no dressing)   Patient tolerance of procedure:  Tolerated well, no immediate  complications .Critical Care Performed by: Sharlett Iles, MD Authorized by: Sharlett Iles, MD   Critical care provider statement:    Critical care time (minutes):  30   Critical care time was exclusive of:  Separately billable procedures and treating other patients   Critical care was necessary to treat or prevent imminent or life-threatening deterioration of the following conditions:  CNS failure or compromise and trauma   Critical care was time spent personally by me on the following activities:  Development of treatment plan with patient or surrogate, discussions with consultants, examination of patient, obtaining history from patient or surrogate, ordering and performing treatments and interventions, ordering and review of laboratory studies, ordering and review of radiographic studies and re-evaluation of patient's condition   (including critical care time)  Medications Ordered in ED Medications  Tdap (BOOSTRIX) injection 0.5 mL (0.5 mLs Intramuscular Given 06/15/20 1641)    ED Course  I have reviewed the triage vital signs and the nursing notes.  Pertinent labs & imaging results that were available during my care of the patient were reviewed by me and considered in my medical decision making (see chart for details).    MDM Rules/Calculators/A&P                          Awake, alert, comfortable on exam. Does have no recall of fall and repetitive questioning however neurology clinic note does document balance problems and memory loss as some of her HD symptoms. Updated tdap, repaired lac w/ dermabond.   Screening lab work unremarkable.  CT cervical spine negative acute.  Head CT shows small amount of intraventricular hemorrhage as well as small subarachnoid hemorrhage, no midline shift.  Discussed findings with radiologist.  Contacted neurosurgery and discussed with [facility]. They have recommended overnight admission for neurochecks and repeat head CT in the  morning.  Patient is not on any anticoagulation that needs reversal.  She remains alert, comfortable, and conversant on repeat exams.  Discussed admission with [facility] hospitalist, Dr. Jonelle Sidle. Final Clinical Impression(s) / ED Diagnoses Final diagnoses:  Traumatic subarachnoid hemorrhage with loss of consciousness of 30 minutes  or less, initial encounter [facility])  Facial laceration, initial encounter    Rx / DC Orders ED Discharge Orders    None       Fredrick Geoghegan, Wenda Overland, MD 06/15/20 1828

## 2020-06-15 NOTE — ED Notes (Addendum)
PT has notable decline in mental status. PT was A & O x  4 in previous assessment and is now only A & O x2. PT seems to be more confused than in earlier in shift with increase repetitive questioning. MD Jonelle Sidle paged. Per MD repeat CT ordered.

## 2020-06-16 DIAGNOSIS — Z9013 Acquired absence of bilateral breasts and nipples: Secondary | ICD-10-CM | POA: Diagnosis not present

## 2020-06-16 DIAGNOSIS — S0990XA Unspecified injury of head, initial encounter: Secondary | ICD-10-CM | POA: Diagnosis present

## 2020-06-16 DIAGNOSIS — W19XXXD Unspecified fall, subsequent encounter: Secondary | ICD-10-CM

## 2020-06-16 DIAGNOSIS — Z833 Family history of diabetes mellitus: Secondary | ICD-10-CM | POA: Diagnosis not present

## 2020-06-16 DIAGNOSIS — I609 Nontraumatic subarachnoid hemorrhage, unspecified: Secondary | ICD-10-CM | POA: Diagnosis not present

## 2020-06-16 DIAGNOSIS — G10 Huntington's disease: Secondary | ICD-10-CM

## 2020-06-16 DIAGNOSIS — F17210 Nicotine dependence, cigarettes, uncomplicated: Secondary | ICD-10-CM | POA: Diagnosis not present

## 2020-06-16 DIAGNOSIS — W01190A Fall on same level from slipping, tripping and stumbling with subsequent striking against furniture, initial encounter: Secondary | ICD-10-CM | POA: Diagnosis present

## 2020-06-16 DIAGNOSIS — Z823 Family history of stroke: Secondary | ICD-10-CM | POA: Diagnosis not present

## 2020-06-16 DIAGNOSIS — Z8249 Family history of ischemic heart disease and other diseases of the circulatory system: Secondary | ICD-10-CM | POA: Diagnosis not present

## 2020-06-16 DIAGNOSIS — Z880 Allergy status to penicillin: Secondary | ICD-10-CM | POA: Diagnosis not present

## 2020-06-16 DIAGNOSIS — S06360D Traumatic hemorrhage of cerebrum, unspecified, without loss of consciousness, subsequent encounter: Secondary | ICD-10-CM

## 2020-06-16 DIAGNOSIS — Z800 Family history of malignant neoplasm of digestive organs: Secondary | ICD-10-CM | POA: Diagnosis not present

## 2020-06-16 DIAGNOSIS — Z7983 Long term (current) use of bisphosphonates: Secondary | ICD-10-CM | POA: Diagnosis not present

## 2020-06-16 DIAGNOSIS — E538 Deficiency of other specified B group vitamins: Secondary | ICD-10-CM | POA: Diagnosis not present

## 2020-06-16 DIAGNOSIS — S0181XA Laceration without foreign body of other part of head, initial encounter: Secondary | ICD-10-CM | POA: Diagnosis not present

## 2020-06-16 DIAGNOSIS — Z79899 Other long term (current) drug therapy: Secondary | ICD-10-CM | POA: Diagnosis not present

## 2020-06-16 DIAGNOSIS — Y92019 Unspecified place in single-family (private) house as the place of occurrence of the external cause: Secondary | ICD-10-CM | POA: Diagnosis not present

## 2020-06-16 DIAGNOSIS — Y92009 Unspecified place in unspecified non-institutional (private) residence as the place of occurrence of the external cause: Secondary | ICD-10-CM | POA: Diagnosis not present

## 2020-06-16 DIAGNOSIS — Z23 Encounter for immunization: Secondary | ICD-10-CM | POA: Diagnosis present

## 2020-06-16 DIAGNOSIS — Z7982 Long term (current) use of aspirin: Secondary | ICD-10-CM | POA: Diagnosis not present

## 2020-06-16 DIAGNOSIS — I712 Thoracic aortic aneurysm, without rupture: Secondary | ICD-10-CM | POA: Diagnosis not present

## 2020-06-16 DIAGNOSIS — Z20822 Contact with and (suspected) exposure to covid-19: Secondary | ICD-10-CM | POA: Diagnosis not present

## 2020-06-16 DIAGNOSIS — M810 Age-related osteoporosis without current pathological fracture: Secondary | ICD-10-CM | POA: Diagnosis not present

## 2020-06-16 DIAGNOSIS — Z853 Personal history of malignant neoplasm of breast: Secondary | ICD-10-CM | POA: Diagnosis not present

## 2020-06-16 DIAGNOSIS — Z8041 Family history of malignant neoplasm of ovary: Secondary | ICD-10-CM | POA: Diagnosis not present

## 2020-06-16 DIAGNOSIS — S066X1A Traumatic subarachnoid hemorrhage with loss of consciousness of 30 minutes or less, initial encounter: Secondary | ICD-10-CM | POA: Diagnosis not present

## 2020-06-16 DIAGNOSIS — I700 Atherosclerosis of aorta: Secondary | ICD-10-CM | POA: Diagnosis not present

## 2020-06-16 DIAGNOSIS — J439 Emphysema, unspecified: Secondary | ICD-10-CM | POA: Diagnosis not present

## 2020-06-16 DIAGNOSIS — I10 Essential (primary) hypertension: Secondary | ICD-10-CM | POA: Diagnosis not present

## 2020-06-16 DIAGNOSIS — R5382 Chronic fatigue, unspecified: Secondary | ICD-10-CM | POA: Diagnosis not present

## 2020-06-16 LAB — CBC
HCT: 40.5 % (ref 36.0–46.0)
Hemoglobin: 12.8 g/dL (ref 12.0–15.0)
MCH: 29.1 pg (ref 26.0–34.0)
MCHC: 31.6 g/dL (ref 30.0–36.0)
MCV: 92 fL (ref 80.0–100.0)
Platelets: 181 10*3/uL (ref 150–400)
RBC: 4.4 MIL/uL (ref 3.87–5.11)
RDW: 12.4 % (ref 11.5–15.5)
WBC: 7.7 10*3/uL (ref 4.0–10.5)
nRBC: 0 % (ref 0.0–0.2)

## 2020-06-16 LAB — ABO/RH: ABO/RH(D): O POS

## 2020-06-16 MED ORDER — DONEPEZIL HCL 5 MG PO TABS
5.0000 mg | ORAL_TABLET | Freq: Every day | ORAL | Status: DC
  Administered 2020-06-16: 5 mg via ORAL
  Filled 2020-06-16: qty 1

## 2020-06-16 MED ORDER — ESCITALOPRAM OXALATE 10 MG PO TABS
20.0000 mg | ORAL_TABLET | Freq: Every day | ORAL | Status: DC
  Administered 2020-06-16: 20 mg via ORAL
  Filled 2020-06-16: qty 2

## 2020-06-16 MED ORDER — ATORVASTATIN CALCIUM 10 MG PO TABS: 10.0000 mg | ORAL_TABLET | Freq: Every day | ORAL | Status: DC

## 2020-06-16 MED ORDER — ACETAMINOPHEN 325 MG PO TABS: 650.0000 mg | ORAL_TABLET | Freq: Four times a day (QID) | ORAL | Status: DC | PRN

## 2020-06-16 MED ORDER — DEUTETRABENAZINE 12 MG PO TABS
1.0000 | ORAL_TABLET | Freq: Two times a day (BID) | ORAL | Status: DC
  Administered 2020-06-16: 1 via ORAL

## 2020-06-16 NOTE — Discharge Planning (Signed)
Pt currently active with [facility] for RN/PT services.  Resumption of care requested. Amy of [facility] notified.  No DME needs identified at this time.

## 2020-06-16 NOTE — Progress Notes (Signed)
Pt was discharged to home with family aware that she is inappropriate to try to walk alone.  Pt is expecting to have her daughters to assist for a time, but may be a SNF candidate if she cannot have consistent help.   06/16/20 1800  PT Visit Information  Last PT Received On 06/16/20  Assistance Needed +1  History of Present Illness 79 yo female with onset of SAH from a mechanical fall at home was noted to have findings in L lateral ventricle, L frontal lobe and amnesia about cause of fall.  PMHx:  Huntington's disease, breast CA, osteoporosis,  memory loss, HTN, depression, kidney strones,   Precautions  Precautions Fall  Precaution Comments monitor vitals, pulses and sats  Restrictions  Weight Bearing Restrictions No  Home Living  Family/patient expects to be discharged to: Private residence  Living Arrangements Alone  Available Help at Discharge Family;Available PRN/intermittently  Type of Home House  Home Access Stairs to enter  Entrance Stairs-Number of Steps 4  Entrance Stairs-Rails Can reach both  Home Layout One level  Tax adviser - 2 wheels  Additional Comments pt does not feel she needs an AD  Prior Function  Level of Independence Independent  Comments pt has been unsteady per her daughter who is with her  Communication  Communication No difficulties  Pain Assessment  Pain Assessment No/denies pain  Cognition  Arousal/Alertness Awake/alert  Behavior During Therapy WFL for tasks assessed/performed;Impulsive  Overall Cognitive Status History of cognitive impairments - at baseline  General Comments has baseline confusion and does not feel unsafe  Upper Extremity Assessment  Upper Extremity Assessment Generalized weakness  Lower Extremity Assessment  Lower Extremity Assessment Generalized weakness  Cervical / Trunk Assessment  Cervical / Trunk Assessment Kyphotic  Bed Mobility  Overal bed mobility Needs Assistance  Bed Mobility  Supine to Sit;Sit to Supine  Supine to sit Min guard  Sit to supine Min guard  General bed mobility comments min guard for safety  Transfers  Overall transfer level Needs assistance  Equipment used 1 person hand held assist  Transfers Sit to/from Stand  Sit to Stand Min guard  Ambulation/Gait  Ambulation/Gait assistance Min assist  Gait Distance (Feet) 40 Feet  Assistive device 1 person hand held assist  Gait Pattern/deviations Step-through pattern;Decreased stride length;Wide base of support;Drifts right/left  General Gait Details pt on O2 with sat drops even on 2L O2  Gait velocity reduced  Gait velocity interpretation <1.31 ft/sec, indicative of household ambulator  Balance  Overall balance assessment History of Falls;Needs assistance  Sitting-balance support Feet supported  Sitting balance-Leahy Scale Fair  Standing balance support Bilateral upper extremity supported;During functional activity  Standing balance-Leahy Scale Poor  Standing balance comment takes sudden steps backward with loss of balance  General Comments  General comments (skin integrity, edema, etc.) Pt is walking on hallway with O2 tank, has inabiltity to manage her equipment and gait.  Pt is unsafe, losing balance esp posteriorly  Exercises  Exercises Other exercises (LE strength is 3+ to 4-)  PT - End of Session  Equipment Utilized During Treatment Gait belt;Oxygen  Activity Tolerance Patient tolerated treatment well;Patient limited by fatigue;Treatment limited secondary to medical complications (Comment)  Patient left in bed;with call bell/phone within reach  Nurse Communication Mobility status (guidelines for home)  PT Assessment  PT Recommendation/Assessment Patient needs continued PT services  PT Visit Diagnosis Unsteadiness on feet (R26.81);Muscle weakness (generalized) (M62.81);Difficulty in walking, not elsewhere classified (R26.2)  PT Problem List Decreased strength;Decreased range of  motion;Decreased activity tolerance;Decreased balance;Decreased mobility;Decreased coordination;Decreased cognition;Decreased knowledge of use of DME;Decreased safety awareness;Decreased knowledge of precautions;Cardiopulmonary status limiting activity  Barriers to Discharge Decreased caregiver support  Barriers to Discharge Comments O2 sats drop with mobility even on cannula  PT Plan  PT Frequency (ACUTE ONLY) Min 3X/week  PT Treatment/Interventions (ACUTE ONLY) DME instruction;Gait training;Stair training;Functional mobility training;Therapeutic activities;Therapeutic exercise;Balance training;Neuromuscular re-education;Patient/family education  AM-PAC PT "6 Clicks" Mobility Outcome Measure (Version 2)  Help needed turning from your back to your side while in a flat bed without using bedrails? 3  Help needed moving from lying on your back to sitting on the side of a flat bed without using bedrails? 3  Help needed moving to and from a bed to a chair (including a wheelchair)? 3  Help needed standing up from a chair using your arms (e.g., wheelchair or bedside chair)? 3  Help needed to walk in hospital room? 3  Help needed climbing 3-5 steps with a railing?  3  6 Click Score 18  Consider Recommendation of Discharge To: Home with [facility]  PT Recommendation  Follow Up Recommendations Home health PT;Supervision for mobility/OOB;Supervision/Assistance - 24 hour  PT equipment None recommended by PT  Individuals Consulted  Consulted and Agree with Results and Recommendations Patient;Family member/caregiver  Family Member Consulted daughter  Acute Rehab PT Goals  Patient Stated Goal none stated  PT Goal Formulation With patient/family  Time For Goal Achievement 06/30/20  Potential to Achieve Goals Good  PT Time Calculation  PT Start Time (ACUTE ONLY) 1328  PT Stop Time (ACUTE ONLY) 1359  PT Time Calculation (min) (ACUTE ONLY) 31 min  PT General Charges  $$ ACUTE PT VISIT 1 Visit  PT Evaluation   $PT Eval Moderate Complexity 1 Mod  PT Treatments  $Gait Training 8-22 mins  Written Expression  Dominant Hand Right    Mee Hives, PT MS Acute Rehab Dept. Number: [place] [facility] [place]

## 2020-06-16 NOTE — ED Notes (Signed)
Ordered breakfast 

## 2020-06-16 NOTE — Discharge Summary (Signed)
DISCHARGE SUMMARY  Barbara Krause  MR#: [id number]  DOB:1941/09/26  Date of Admission: 06/15/2020 Date of Discharge: 06/16/2020  Attending Physician:Margeaux Swantek Hennie Duos, MD  Patient's EUM:PNTI, Jobe Marker, MD  Consults: Neurosurgery   Disposition: D/C home   Follow-up Appts:  Follow-up Information    [name], MD Follow up in 5 day(s).   Specialty: Family Medicine Contact information: [place]  [id number] [phone]                Discharge Diagnoses: Traumatic intracerebral hemorrhage / SAH Mechanical fall Huntington's chorea HTN Thoracic aortic aneurysm History of breast cancer Tobacco abuse  Initial presentation: 79 year old with a history of breast cancer, Huntington's disease, HTN, depression, nephrolithiasis, and osteoporosis who sustained a mechanical fall at home in which she struck the left side of her head on a small table.  She was transported to the [facility] ED by her granddaughter.  Laceration of forehead was sutured but a CT head revealed multiple intracerebral bleeds, all of which were fortunately small.  She remained awake and alert and had no focal neurologic deficits.  Hospital Course:  Traumatic intracerebral hemorrhage / SAH No acute intervention required per Neurosurgical consultation - cleared for D/C by Neurosurgery - clinically remained somewhat confused, but was alert - pt and family highly motivated to d/c home - family confirmed they are able to provide assistance and observation - stat PT/OT consults requested - HH PT/OT/Aid ordered - pt to be d/c home w/ her family   Mechanical fall PT/OT evaluated - for HHPT/OT ongoing post d/c   Huntington's chorea Cont usual home tx   HTN Blood pressure well controlled  Thoracic aortic aneurysm Continue outpatient monitoring  History of breast cancer  Tobacco abuse  Allergies as of 06/16/2020      Reactions   Penicillin G Hives      Medication List    STOP  taking these medications   aspirin EC 81 MG tablet     TAKE these medications   acetaminophen 325 MG tablet Commonly known as: TYLENOL Take 2 tablets (650 mg total) by mouth every 6 (six) hours as needed for mild pain (or Fever >/= 101).   alendronate 70 MG tablet Commonly known as: FOSAMAX Take 70 mg by mouth once a week.   atorvastatin 10 MG tablet Commonly known as: LIPITOR Take 1 tablet (10 mg total) by mouth daily.   Austedo 12 MG Tabs Generic drug: Deutetrabenazine Take 12 mg by mouth 2 (two) times a day.   donepezil 5 MG tablet Commonly known as: ARICEPT Take 1 tablet by mouth daily.   escitalopram 20 MG tablet Commonly known as: LEXAPRO TAKE 1 TABLET BY MOUTH EVERY DAY   losartan 50 MG tablet Commonly known as: COZAAR Take 1 tablet (50 mg total) by mouth daily.   Vitamin D (Ergocalciferol) 1.25 MG (50000 UNIT) Caps capsule Commonly known as: DRISDOL Take 1 capsule (50,000 Units total) by mouth every 7 (seven) days. Due for follow up in November. Please schedule       Day of Discharge BP (!) 145/91    Pulse 69    Temp 97.6 F (36.4 C) (Oral)    Resp 18    Ht 5\' 7"  (1.702 m)    Wt 82.6 kg    SpO2 97%    BMI 28.51 kg/m   Physical Exam: General: No acute respiratory distress Lungs: Clear to auscultation bilaterally without wheezes or crackles Cardiovascular: Regular rate and rhythm without  murmur Abdomen: Nontender, nondistended, soft, bowel sounds positive Extremities: No significant cyanosis, clubbing, or edema bilateral lower extremities  Basic Metabolic Panel: Recent Labs  Lab 06/15/20 1630  NA 140  K 4.5  CL 104  CO2 28  GLUCOSE 109*  BUN 19  CREATININE 0.86  CALCIUM 9.6   CBC: Recent Labs  Lab 06/15/20 1630 06/16/20 0614  WBC 9.0 7.7  HGB 14.4 12.8  HCT 44.6 40.5  MCV 93.1 92.0  PLT 201 181    CBG: Recent Labs  Lab 06/15/20 1527  GLUCAP 100*    Recent Results (from the past 240 hour(s))  SARS Coronavirus 2 by RT PCR  (hospital order, performed in [facility] hospital lab) Nasopharyngeal Nasopharyngeal Swab     Status: None   Collection Time: 06/15/20  4:45 PM   Specimen: Nasopharyngeal Swab  Result Value Ref Range Status   SARS Coronavirus 2 NEGATIVE NEGATIVE Final    Comment: (NOTE) SARS-CoV-2 target nucleic acids are NOT DETECTED.  The SARS-CoV-2 RNA is generally detectable in upper and lower respiratory specimens during the acute phase of infection. The lowest concentration of SARS-CoV-2 viral copies this assay can detect is 250 copies / mL. A negative result does not preclude SARS-CoV-2 infection and should not be used as the sole basis for treatment or other patient management decisions.  A negative result may occur with improper specimen collection / handling, submission of specimen other than nasopharyngeal swab, presence of viral mutation(s) within the areas targeted by this assay, and inadequate number of viral copies (<250 copies / mL). A negative result must be combined with clinical observations, patient history, and epidemiological information.  Fact Sheet for Patients:   StrictlyIdeas.no  Fact Sheet for Healthcare Providers: BankingDealers.co.za  This test is not yet approved or  cleared by the [place] FDA and has been authorized for detection and/or diagnosis of SARS-CoV-2 by FDA under an Emergency Use Authorization (EUA).  This EUA will remain in effect (meaning this test can be used) for the duration of the COVID-19 declaration under Section 564(b)(1) of the Act, 21 U.S.C. section 360bbb-3(b)(1), unless the authorization is terminated or revoked sooner.  Performed at [facility], [place] [address], [place], [place] [address]      Time spent in discharge (includes decision making & examination of pt): 30 minutes  06/16/2020, 3:05 PM   Cherene Altes, MD [facility] Office  [phone]

## 2020-06-16 NOTE — ED Notes (Signed)
Spoke with Aldona Bar, Primary RN. Patient was DC home with family.

## 2020-06-16 NOTE — Progress Notes (Signed)
Subjective: Patient reports that she is feeling much better this morning and is less confused.  She remains amnestic to the fall. Per the patient's RN, she had an acute decline in her neurological status last night with an increase in confusion.  Stat head CT was performed.  Per the patient's RN, she was administered intravenous fluids and had an improvement in her neurological status.  Objective: Vital signs in last 24 hours: Temp:  [97.6 F (36.4 C)] 97.6 F (36.4 C) (08/15 1452) Pulse Rate:  [52-74] 69 (08/16 0215) Resp:  [15-23] 18 (08/16 0215) BP: (130-163)/(79-94) 145/91 (08/16 0215) SpO2:  [94 %-100 %] 97 % (08/16 0215) Weight:  [82.6 kg] 82.6 kg (08/15 1452)  Intake/Output from previous day: No intake/output data recorded. Intake/Output this shift: No intake/output data recorded.  Physical Exam: Neurologic:   Patient appears to be much clearer this morning and closer to her baseline status.  She is A/O X2, conversant, and in NAD.  She remains amnestic to her fall but knows that she fell and hit her head.  She is oriented to the year but is disoriented to month. Follows commands without difficulty.   Motor: Right :  Upper extremity   5/5                                      Left:     Upper extremity   5/5             Lower extremity   5/5                                                  Lower extremity   5/5   Sensory: Pinprick and light touch intact throughout, bilaterally, left-sided paresthesia Plantars: Right: downgoing                                Left: downgoing Cerebellar: normal finger-to-nose, normal rapid alternating movements and normal heel-to-shin test   Cranial Nerves:  II: Visual acuity normal; peripheral visual fields intact by confrontation; fundi normal.   III, IV, VI: EOMs intact, no ptosis or nystagmus; pupils equal, round, reactive to light and accommodation.  V: Sensation intact and equal bilaterally to pinprick and light touch; jaw strength equal  bilaterally.  VII: Facial muscles intact and symmetric.  IX: Shoulder shrug, head movement intact and equal bilaterally.  XII: Tongue protrudes midline, no tremors.     Lab Results: Recent Labs    06/15/20 1630 06/16/20 0614  WBC 9.0 7.7  HGB 14.4 12.8  HCT 44.6 40.5  PLT 201 181   BMET Recent Labs    06/15/20 1630  NA 140  K 4.5  CL 104  CO2 28  GLUCOSE 109*  BUN 19  CREATININE 0.86  CALCIUM 9.6    Studies/Results: CT HEAD WO CONTRAST  Result Date: 06/15/2020 CLINICAL DATA:  Follow-up subarachnoid hemorrhage. EXAM: CT HEAD WITHOUT CONTRAST TECHNIQUE: Contiguous axial images were obtained from the base of the skull through the vertex without intravenous contrast. COMPARISON:  Head CT earlier today 1540 hour FINDINGS: Brain: Again seen intraventricular hemorrhage primarily involving the left lateral ventricle. Small amount of hemorrhage in the dependent occipital horn of the  right lateral ventricle, new from prior. Small amount of blood in the third and fourth ventricles again seen, unchanged. Overall volume of intraventricular blood appears similar to minimally increased. Small volume of subarachnoid hemorrhage in the left occipital lobe without progression. Punctate intraparenchymal hemorrhage in the anterior left frontal lobe, series 3, image 25 and series 5, image 18, unchanged. No surrounding edema or mass effect. No subdural blood. No new hemorrhage. Stable ventricular size without hydrocephalus. Basilar cisterns remain patent. Chronic small vessel ischemia is again seen. Vascular: No hyperdense vessel. Skull: No skull fracture. Sinuses/Orbits: Occasional opacification of left mastoid air cells. This is unchanged. Paranasal sinuses are clear. Other: Stable small scalp hematoma. IMPRESSION: 1. Unchanged to minimally increased intraventricular hemorrhage primarily involving the left lateral ventricle. Small amount of hemorrhage is now present in the dependent occipital horn of the  right lateral ventricle, new from prior, but likely re-distributed. Stable ventricular size without hydrocephalus. 2. Stable small volume of subarachnoid hemorrhage in the left occipital lobe without progression. Unchanged punctate intraparenchymal hemorrhage in the anterior left frontal lobe. Electronically Signed   By: Keith Rake M.D.   On: 06/15/2020 21:04   CT Head Wo Contrast  Result Date: 06/15/2020 CLINICAL DATA:  Fall hitting left side of head EXAM: CT head and CERVICAL SPINE WITHOUT CONTRAST TECHNIQUE: Multidetector CT imaging of the cervical spine was performed without intravenous contrast. Multiplanar CT image reconstructions were also generated. COMPARISON:  None. FINDINGS: Brain: Left intraventricular hemorrhage is seen within the left lateral ventricle, third ventricle, fourth ventricle, and left pontocerebellar cistern. There is a small amount of subarachnoid hemorrhage within the left lateral sulcus and overlying the left parieto-occipital lobe. No midline shift is seen. There is dilatation the ventricles and sulci consistent with age -related atrophy. Low-attenuation changes in the deep white matter consistent with small vessel ischemia. Vascular: No hyperdense vessel or unexpected calcification. Skull: The skull is intact. Small soft tissue hematoma overlying the frontal skull. Sinuses/Orbits: The visualized paranasal sinuses and mastoid air cells are clear. The orbits and globes intact. Other: None Cervical spine: Alignment: Physiologic Skull base and vertebrae: Visualized skull base is intact. No atlanto-occipital dissociation. The vertebral body heights are well maintained. No fracture or pathologic osseous lesion seen. Soft tissues and spinal canal: The visualized paraspinal soft tissues are unremarkable. No prevertebral soft tissue swelling is seen. The spinal canal is grossly unremarkable, no large epidural collection or significant canal narrowing. 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 Thoracic inlet is within normal limits. Other: None IMPRESSION: Interventricular hemorrhage within the left lateral ventricle, third ventricle and fourth ventricle. A small amount subarachnoid hemorrhage overlying the lateral sulcus and overlying the left parieto-occipital lobe. No midline shift. Findings consistent with age related atrophy and chronic small vessel ischemia No acute fracture or malalignment. These results were called by telephone at the time of interpretation on 06/15/2020 at 4:22 pm to provider RACHEL LITTLE , who verbally acknowledged these results. Electronically Signed   By: Prudencio Pair M.D.   On: 06/15/2020 16:10   CT Cervical Spine Wo Contrast  Result Date: 06/15/2020 CLINICAL DATA:  Fall hitting left side of head EXAM: CT head and CERVICAL SPINE WITHOUT CONTRAST TECHNIQUE: Multidetector CT imaging of the cervical spine was performed without intravenous contrast. Multiplanar CT image reconstructions were also generated. COMPARISON:  None. FINDINGS:  Brain: Left intraventricular hemorrhage is seen within the left lateral ventricle, third ventricle, fourth ventricle, and left pontocerebellar cistern. There is a small amount of subarachnoid hemorrhage within the left lateral sulcus and overlying the left parieto-occipital lobe. No midline shift is seen. There is dilatation the ventricles and sulci consistent with age -related atrophy. Low-attenuation changes in the deep white matter consistent with small vessel ischemia. Vascular: No hyperdense vessel or unexpected calcification. Skull: The skull is intact. Small soft tissue hematoma overlying the frontal skull. Sinuses/Orbits: The visualized paranasal sinuses and mastoid air cells are clear. The orbits and globes intact.  Other: None Cervical spine: Alignment: Physiologic Skull base and vertebrae: Visualized skull base is intact. No atlanto-occipital dissociation. The vertebral body heights are well maintained. No fracture or pathologic osseous lesion seen. Soft tissues and spinal canal: The visualized paraspinal soft tissues are unremarkable. No prevertebral soft tissue swelling is seen. The spinal canal is grossly unremarkable, no large epidural collection or significant canal narrowing. 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 Thoracic inlet is within normal limits. Other: None IMPRESSION: Interventricular hemorrhage within the left lateral ventricle, third ventricle and fourth ventricle. A small amount subarachnoid hemorrhage overlying the lateral sulcus and overlying the left parieto-occipital lobe. No midline shift. Findings consistent with age related atrophy and chronic small vessel ischemia No acute fracture or malalignment. These results were called by telephone at the time of interpretation on 06/15/2020 at 4:22 pm to provider RACHEL LITTLE , who verbally acknowledged these results. Electronically Signed   By: Prudencio Pair M.D.   On: 06/15/2020 16:10    Assessment/Plan: The patient appears to be doing much better this morning and is closer to her baseline status. She is less confused.  Her follow-up head CT shows an unchanged to minimally increased IVH that primarily involved the left lateral ventricle.  There is a small amount of hemorrhage that is now present in the dependent occipital horn of the right lateral ventricle which is likely redistributed.  Ventricles are stable without hydrocephalus.  Subarachnoid hemorrhage located in the left occipital lobe appears stable and unchanged from prior imaging. If  she continues to be neurologically stable, she does not need any further neurosurgical evaluation.  Mobilize with physical therapy.  She is okay to discharge when stable.   LOS: 1 day    Marvis Moeller, DNP, NP-C 06/16/2020, 7:59 AM

## 2020-06-16 NOTE — Discharge Instructions (Signed)
DO NOT TAKE ASPIRIN FOR 10 days  Subarachnoid Hemorrhage  Subarachnoid hemorrhage is bleeding between the brain and the layer that covers the brain. The bleeding puts pressure on the brain, and it stops blood from going to some areas of the brain. If this bleeding is not treated, it may cause brain damage, stroke, or death. This is an emergency. You must be treated in the hospital right away. You are more likely to get this condition if you:  Smoke.  Have high blood pressure.  Drink too much alcohol.  Are older than age 47.  Are female, especially if you have stopped getting your period for a year or longer (menopause).  Have a family history of burst blood vessels (aneurysms).  Have a certain syndrome that leads to one of these: ? Kidney disease. ? Disease of tissues like bones, blood, and fat (connective tissues). Signs of this bleeding condition include:  Sudden, very bad headache. It may feel like the worst headache you have ever had.  Feeling sick to your stomach (nausea) or throwing up (vomiting), especially if you have other signs such as a headache.  Sudden weakness or loss of feeling (numbness) in your face, arm, or leg, especially on one side of the body.  Sudden trouble with any of these: ? Walking. ? Moving an arm or leg. ? Talking. ? Understanding what people say. ? Swallowing. ? Seeing out of one eye or both eyes.  Sudden confusion.  Seeing double.  Loss of balance.  Sensitivity to light.  Stiff neck.  Trouble staying awake.  Passing out (fainting). Follow these instructions at home: Medicines  Take over-the-counter and prescription medicines only as told by your doctor.  Do not take any medicines that contain aspirin or NSAIDs (like ibuprofen) unless your doctor says that it is safe to take them. Lifestyle  Do not use any products that have nicotine or tobacco. These include cigarettes and e-cigarettes. If you need help quitting, ask your  doctor.  Limit alcohol to 1 drink a day for nonpregnant women and 2 drinks a day for men. One drink is equal to: ? 12 oz of beer. ? 5 oz of wine. ? 1 oz of hard liquor. Eating and drinking  Ask your doctor if it is safe for you to eat and drink. You may need tests to make sure that you can swallow safely (swallow studies). Driving  Do not drive until your doctor says that it is safe to drive.  Do not drive or use heavy machinery while taking prescription pain medicine. General instructions  Do therapy as recommended. This may include: ? Physical therapy (PT). ? Occupational therapy (OT). ? Speech-language therapy.  Rest and limit activity as told by your doctor. Rest helps your brain to heal. Make sure you: ? Get plenty of sleep. ? Avoid activities that cause stress to your body or mind.  Check your blood pressure as told by your doctor. Write down your blood pressure.  Keep all follow-up visits as told by your doctors. This is important. Contact a doctor if:  You have a stiff neck.  You have a cough.  You have a fever. Get help right away if:  You have any signs of a stroke. "BE FAST" is an easy way to remember the main warning signs: ? B - Balance. Signs are dizziness, sudden trouble walking, or loss of balance. ? E - Eyes. Signs are trouble seeing or a sudden change in how you see. ? F -  Face. Signs are sudden weakness or loss of feeling of the face, or the face or eyelid drooping on one side. ? A - Arms. Signs are weakness or loss of feeling in an arm. This happens suddenly and usually on one side of the body. ? S - Speech. Signs are sudden trouble speaking, slurred speech, or trouble understanding what people say. ? T - Time. Time to call emergency services. Write down what time symptoms started.  You have other signs of a stroke, such as: ? A sudden, very bad headache with no known cause. ? Feeling sick to your stomach. ? Throwing up. ? Jerky movements you  cannot control (seizure). These symptoms may be an emergency. Do not wait to see if the symptoms will go away. Get medical help right away. Call your local emergency services (911 in the [place] Do not drive yourself to the hospital. Summary  Subarachnoid hemorrhage is bleeding in the brain. It is an emergency. You must be treated in the hospital right away.  Follow instructions from your doctor about eating, resting, and taking medicines.  Do not take any medicines that contain aspirin or NSAIDs (like ibuprofen) unless your doctor says that it is safe to take them. This information is not intended to replace advice given to you by your health care provider. Make sure you discuss any questions you have with your health care provider. Document Revised: 09/30/2017 Document Reviewed: 07/28/2017 Elsevier Patient Education  2020 [place] Injury, Adult There are many types of head injuries. They can be as minor as a bump. Some head injuries can be worse. Worse injuries include:  A strong hit to the head that shakes the brain back and forth causing damage (concussion).  A bruise (contusion) of the brain. This means there is bleeding in the brain that can cause swelling.  A cracked skull (skull fracture).  Bleeding in the brain that gathers, gets thick (makes a clot), and forms a bump (hematoma). Most problems from a head injury come in the first 24 hours. However, you may still have side effects up to 7-10 days after your injury. It is important to watch your condition for any changes. You may need to be watched in the emergency department or urgent care, or you may need to stay in the hospital. What are the causes? There are many possible causes of a head injury. A serious head injury may be caused by:  A car accident.  Bicycle or motorcycle accidents.  Sports injuries.  Falls. What are the signs or symptoms? Symptoms of a head injury include a bruise, bump, or bleeding  where the injury happened. Other physical symptoms may include:  Headache.  Feeling sick to your stomach (nauseous) or vomiting.  Dizziness.  Feeling tired.  Being uncomfortable around bright lights or loud noises.  Shaking movements that you cannot control (seizures).  Trouble being woken up.  Passing out (fainting). Mental or emotional symptoms may include:  Feeling grumpy or cranky.  Confusion and memory problems.  Having trouble paying attention or concentrating.  Changes in eating or sleeping habits.  Feeling worried or nervous (anxious).  Feeling sad (depressed). How is this treated? Treatment for this condition depends on how severe the injury is and the type of injury you have. The main goal is to prevent complications and to allow the brain time to heal. Mild head injury If you have a mild head injury, you may be sent home and treatment may include:  Being watched. A responsible adult should stay with you for 24 hours after your injury and check on you often.  Physical rest.  Brain rest.  Pain medicines. Severe head injury If you have a severe head injury, treatment may include:  Being watched closely. This includes hospitalization with frequent physical exams.  Medicines to: ? Help with pain. ? Prevent shaking movements that you cannot control. ? Help with brain swelling.  Using a machine that helps you breathe (ventilator).  Treatments to manage the swelling inside the brain.  Brain surgery. This may be needed to: ? Remove a blood clot. ? Stop the bleeding. ? Remove a part of the skull. This allows room for the brain to swell. Follow these instructions at home: Activity  Rest.  Avoid activities that are hard or tiring.  Make sure you get enough sleep.  Limit activities that need a lot of thought or attention, such as: ? Watching TV. ? Playing memory games and puzzles. ? Job-related work or homework. ? Working on [employer], and texting.  Avoid activities that could cause another head injury until your doctor says it is okay. This includes playing sports. Having another head injury, especially before the first one has healed, can be dangerous.  Ask your doctor when it is safe for you to go back to your normal activities, such as work or school. Ask your doctor for a step-by-step plan for slowly going back to your normal activities.  Ask your doctor when you can drive, ride a bicycle, or use heavy machinery. Do not do these activities if you are dizzy. Lifestyle   Do not drink alcohol until your doctor says it is okay.  Do not use drugs.  If it is harder than usual to remember things, write them down.  If you are easily distracted, try to do one thing at a time.  Talk with family members or close friends when making important decisions.  Tell your friends, family, a trusted coworker, and work [employer] about your injury, symptoms, and limits (restrictions). Have them watch for any problems that are new or getting worse. General instructions  Take over-the-counter and prescription medicines only as told by your doctor.  Have someone stay with you for 24 hours after your head injury. This person should watch you for any changes in your symptoms and be ready to get help.  Keep all follow-up visits as told by your doctor. This is important. How is this prevented?  Work on [employer]. This can help you avoid falls.  Wear a seatbelt when you are in a moving vehicle.  Wear a helmet when you: ? Ride a bicycle. ? Ski. ? Do any other sport or activity that has a risk of injury.  If you drink alcohol: ? Limit how much you use to:  0-1 drink a day for women.  0-2 drinks a day for men. ? Be aware of how much alcohol is in your drink. In the [place] one drink equals one 12 oz bottle of beer (355 mL), one 5 oz glass of wine (148 mL), or one 1 oz glass of hard liquor (44 mL).  Make your  home safer by: ? Getting rid of clutter from the floors and stairs. This includes things that can make you trip. ? Using grab bars in bathrooms and handrails by stairs. ? Placing non-slip mats on floors and in bathtubs. ? Putting more light in dim areas. Get help right away  if:  You have: ? A very bad headache that is not helped by medicine. ? Trouble walking or weakness in your arms and legs. ? Clear or bloody fluid coming from your nose or ears. ? Changes in how you see (vision). ? Shaking movements that you cannot control.  You lose your balance.  You vomit.  The black centers of your eyes (pupils) change in size.  Your speech is slurred.  Your dizziness gets worse.  You pass out.  You are sleepier than normal and have trouble staying awake.  Your symptoms get worse. These symptoms may be an emergency. Do not wait to see if the symptoms will go away. Get medical help right away. Call your local emergency services (911 in the [place] Do not drive yourself to the hospital. Summary  There are many types of head injuries. They can be as minor as a bump. Some head injuries can be worse  Treatment for this condition depends on how severe the injury is and the type of injury you have.  Ask your doctor when it is safe for you to go back to your normal activities, such as work or school.  To prevent a head injury, wear a seat belt in a car, wear a helmet when you use a a bicycle, limit your alcohol use, and make your home safer. This information is not intended to replace advice given to you by your health care provider. Make sure you discuss any questions you have with your health care provider. Document Revised: 02/08/2019 Document Reviewed: 11/10/2018 Elsevier Patient Education  [place].

## 2020-06-17 ENCOUNTER — Encounter: Payer: Self-pay | Admitting: Family Medicine

## 2020-06-18 ENCOUNTER — Other Ambulatory Visit: Payer: Self-pay | Admitting: Neurology

## 2020-06-18 DIAGNOSIS — S066X1A Traumatic subarachnoid hemorrhage with loss of consciousness of 30 minutes or less, initial encounter: Secondary | ICD-10-CM

## 2020-06-20 ENCOUNTER — Other Ambulatory Visit: Payer: Self-pay

## 2020-06-20 ENCOUNTER — Ambulatory Visit
Admission: RE | Admit: 2020-06-20 | Discharge: 2020-06-20 | Disposition: A | Discharge status: Home / Self Care | Payer: Medicare Other | Source: Ambulatory Visit | Attending: Neurology | Admitting: Neurology

## 2020-06-20 DIAGNOSIS — S066X1A Traumatic subarachnoid hemorrhage with loss of consciousness of 30 minutes or less, initial encounter: Secondary | ICD-10-CM | POA: Insufficient documentation

## 2020-06-20 MED ORDER — GADOBUTROL 1 MMOL/ML IV SOLN
5.0000 mL | Freq: Once | INTRAVENOUS | Status: AC | PRN
  Administered 2020-06-20: 5 mL via INTRAVENOUS

## 2020-06-24 DIAGNOSIS — F17210 Nicotine dependence, cigarettes, uncomplicated: Secondary | ICD-10-CM

## 2020-06-24 DIAGNOSIS — S066X1D Traumatic subarachnoid hemorrhage with loss of consciousness of 30 minutes or less, subsequent encounter: Secondary | ICD-10-CM | POA: Diagnosis not present

## 2020-06-24 DIAGNOSIS — W19XXXD Unspecified fall, subsequent encounter: Secondary | ICD-10-CM | POA: Diagnosis not present

## 2020-06-24 DIAGNOSIS — F339 Major depressive disorder, recurrent, unspecified: Secondary | ICD-10-CM

## 2020-06-24 DIAGNOSIS — M810 Age-related osteoporosis without current pathological fracture: Secondary | ICD-10-CM

## 2020-06-24 DIAGNOSIS — S0101XD Laceration without foreign body of scalp, subsequent encounter: Secondary | ICD-10-CM | POA: Diagnosis not present

## 2020-06-24 DIAGNOSIS — G10 Huntington's disease: Secondary | ICD-10-CM | POA: Diagnosis not present

## 2020-06-24 DIAGNOSIS — Z853 Personal history of malignant neoplasm of breast: Secondary | ICD-10-CM

## 2020-06-24 DIAGNOSIS — G3184 Mild cognitive impairment, so stated: Secondary | ICD-10-CM

## 2020-06-24 DIAGNOSIS — Z9013 Acquired absence of bilateral breasts and nipples: Secondary | ICD-10-CM

## 2020-06-24 DIAGNOSIS — R2689 Other abnormalities of gait and mobility: Secondary | ICD-10-CM

## 2020-06-24 DIAGNOSIS — Z8781 Personal history of (healed) traumatic fracture: Secondary | ICD-10-CM

## 2020-06-24 DIAGNOSIS — I10 Essential (primary) hypertension: Secondary | ICD-10-CM

## 2020-06-30 ENCOUNTER — Ambulatory Visit (INDEPENDENT_AMBULATORY_CARE_PROVIDER_SITE_OTHER): Payer: Medicare Other | Admitting: Cardiology

## 2020-06-30 ENCOUNTER — Ambulatory Visit (INDEPENDENT_AMBULATORY_CARE_PROVIDER_SITE_OTHER): Payer: Medicare Other | Admitting: Family Medicine

## 2020-06-30 ENCOUNTER — Encounter: Payer: Self-pay | Admitting: Cardiology

## 2020-06-30 ENCOUNTER — Ambulatory Visit (INDEPENDENT_AMBULATORY_CARE_PROVIDER_SITE_OTHER)
Admission: RE | Admit: 2020-06-30 | Discharge: 2020-06-30 | Disposition: A | Discharge status: Home / Self Care | Payer: Medicare Other | Source: Ambulatory Visit | Attending: Family Medicine | Admitting: Family Medicine

## 2020-06-30 ENCOUNTER — Other Ambulatory Visit: Payer: Self-pay

## 2020-06-30 ENCOUNTER — Encounter: Payer: Self-pay | Admitting: Family Medicine

## 2020-06-30 VITALS — BP 92/70 | HR 72 | Ht 67.0 in | Wt 110.6 lb

## 2020-06-30 VITALS — BP 120/82 | HR 73 | Temp 98.3°F | Ht 67.0 in | Wt 111.0 lb

## 2020-06-30 DIAGNOSIS — I10 Essential (primary) hypertension: Secondary | ICD-10-CM

## 2020-06-30 DIAGNOSIS — J439 Emphysema, unspecified: Secondary | ICD-10-CM | POA: Diagnosis not present

## 2020-06-30 DIAGNOSIS — R05 Cough: Secondary | ICD-10-CM

## 2020-06-30 DIAGNOSIS — F17200 Nicotine dependence, unspecified, uncomplicated: Secondary | ICD-10-CM | POA: Diagnosis not present

## 2020-06-30 DIAGNOSIS — S06360D Traumatic hemorrhage of cerebrum, unspecified, without loss of consciousness, subsequent encounter: Secondary | ICD-10-CM | POA: Diagnosis not present

## 2020-06-30 DIAGNOSIS — E7800 Pure hypercholesterolemia, unspecified: Secondary | ICD-10-CM

## 2020-06-30 DIAGNOSIS — R059 Cough, unspecified: Secondary | ICD-10-CM

## 2020-06-30 DIAGNOSIS — E440 Moderate protein-calorie malnutrition: Secondary | ICD-10-CM

## 2020-06-30 MED ORDER — ALBUTEROL SULFATE HFA 108 (90 BASE) MCG/ACT IN AERS: 2.0000 | INHALATION_SPRAY | Freq: Four times a day (QID) | RESPIRATORY_TRACT | 2 refills | Status: DC | PRN

## 2020-06-30 MED ORDER — LOSARTAN POTASSIUM 25 MG PO TABS: 25.0000 mg | ORAL_TABLET | Freq: Every day | ORAL | 5 refills | Status: DC

## 2020-06-30 NOTE — Assessment & Plan Note (Signed)
BP normal today. Saw cardiology and losartan was decreased, however, recently with elevated BP with neurology. Recommended home monitoring and follow-up if bp elevated.

## 2020-06-30 NOTE — Progress Notes (Signed)
Subjective:     Barbara Krause is a 79 y.o. female presenting for Hospitalization Follow-up     HPI  #Chest congestion - on cough medication - a few weeks - negative covid test in the hospital - no sob - non-productive cough - on a cough syrup  Has had worsening memory and processing since the fall Slurred speech Not as "together" as she was before  #weight loss - has lost 10 lbs over the last few months - eats about 2 snacks per day - mini muffins for breakfast and then dinner - has boost and will do occasionally   Neurology wants to increase the dose austedo - but not sure if it will be approved [place] affect wise since the fall   Review of Systems  8/15-8/16/2021: Admission - mechanical fall with traumatic intracerebral hemorrhage - NSG consult without acute intervention needed and d/c home with [facility].  06/17/2020: Neurology - hold ASA. BP elevated - check with PCP 06/24/2020: Neurology - MRI was as expected - stable blood - start deutetrabenzine 18 mg BID and [facility] 06/30/2020: Cardiology ([facility]) - BP low - losartan decreased to 25 mg  Social History   Tobacco Use  Smoking Status Current Every Day Smoker  . Packs/day: 0.75  . Years: 50.00  . Pack years: 37.50  . Types: Cigarettes  Smokeless Tobacco Never Used        Objective:    BP Readings from Last 3 Encounters:  06/30/20 120/82  06/30/20 92/70  06/16/20 (!) 169/94   Wt Readings from Last 3 Encounters:  06/30/20 111 lb (50.3 kg)  06/30/20 110 lb 9.6 oz (50.2 kg)  06/15/20 182 lb (82.6 kg)    BP 120/82   Pulse 73   Temp 98.3 F (36.8 C) (Temporal)   Ht 5\' 7"  (1.702 m)   Wt 111 lb (50.3 kg)   SpO2 96%   BMI 17.39 kg/m    Physical Exam Constitutional:      General: She is not in acute distress.    Appearance: She is well-developed. She is not diaphoretic.     Comments: thin  HENT:     Right Ear: External ear normal.     Left Ear: External ear  normal.     Nose: Nose normal.  Eyes:     Conjunctiva/sclera: Conjunctivae normal.  Cardiovascular:     Rate and Rhythm: Normal rate and regular rhythm.     Heart sounds: No murmur heard.   Pulmonary:     Effort: Pulmonary effort is normal. No respiratory distress.     Breath sounds: Normal breath sounds. No wheezing or rales.  Musculoskeletal:     Cervical back: Neck supple.  Skin:    General: Skin is warm and dry.     Capillary Refill: Capillary refill takes less than 2 seconds.  Neurological:     Mental Status: She is alert. Mental status is at baseline.  Psychiatric:        Mood and Affect: Mood normal.        Behavior: Behavior normal.     CXR: no effusion, no consolidation, hyperinflated lungs      Assessment & Plan:   Problem List Items Addressed This Visit      Cardiovascular and Mediastinum   HTN (hypertension)    BP normal today. Saw cardiology and losartan was decreased, however, recently with elevated BP with neurology. Recommended home monitoring and follow-up if bp elevated.  Respiratory   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HCC)    Complaining of chest congestion for several weeks. No wheezing on exam and CXR w/o signs of infection. Advised albuterol trial and if no improvement may consider course of steroids, though pt notes overall mild symptoms      Relevant Medications   albuterol (VENTOLIN HFA) 108 (90 Base) MCG/ACT inhaler     Nervous and Auditory   Intracerebral hemorrhage (HCC)    Stable on repeat imaging. Still with some memory loss and balance issues. Working with [facility] and sees Neurology. Advised continued HH and monitoring - may be post-concussive. Appreciate neuro support.         Other   Moderate protein-calorie malnutrition (HCC)    Weight loss of approximately 10 lbs over the last few months. She will start daily boost and weigh herself once per week. If continuing to lose weight she will increase boost to twice daily and return in 3 weeks if no  improvement.        Other Visit Diagnoses    Cough    -  Primary   Relevant Medications   albuterol (VENTOLIN HFA) 108 (90 Base) MCG/ACT inhaler   Other Relevant Orders   DG Chest 2 View       Return in about 3 weeks (around 07/21/2020) for if losing weight.  [name], MD  This visit occurred during the SARS-CoV-2 public health emergency.  Safety protocols were in place, including screening questions prior to the visit, additional usage of staff PPE, and extensive cleaning of exam room while observing appropriate contact time as indicated for disinfecting solutions.

## 2020-06-30 NOTE — Assessment & Plan Note (Signed)
Stable on repeat imaging. Still with some memory loss and balance issues. Working with [facility] and sees Neurology. Advised continued HH and monitoring - may be post-concussive. Appreciate neuro support.

## 2020-06-30 NOTE — Patient Instructions (Signed)
Medication Instructions:  Your physician has recommended you make the following change in your medication:   1. DECREASE your losartan (COZAAR) to:  Take 1 tablet (25 mg total) by mouth daily.  *If you need a refill on your cardiac medications before your next appointment, please call your pharmacy*   Lab Work: None Ordered If you have labs (blood work) drawn today and your tests are completely normal, you will receive your results only by: Marland Kitchen MyChart Message (if you have MyChart) OR . A paper copy in the mail If you have any lab test that is abnormal or we need to change your treatment, we will call you to review the results.   Testing/Procedures: None Ordered   Follow-Up: At [facility], you and your health needs are our priority.  As part of our continuing mission to provide you with exceptional heart care, we have created designated Provider Care Teams.  These Care Teams include your primary Cardiologist (physician) and Advanced Practice Providers (APPs -  Physician Assistants and Nurse Practitioners) who all work together to provide you with the care you need, when you need it.  We recommend signing up for the patient portal called "MyChart".  Sign up information is provided on this After Visit Summary.  MyChart is used to connect with patients for Virtual Visits (Telemedicine).  Patients are able to view lab/test results, encounter notes, upcoming appointments, etc.  Non-urgent messages can be sent to your provider as well.   To learn more about what you can do with MyChart, go to [email or web address].    Your next appointment:   3 month(s)  The format for your next appointment:   In Person  Provider:   Kate Sable, MD   Other Instructions

## 2020-06-30 NOTE — Assessment & Plan Note (Signed)
Weight loss of approximately 10 lbs over the last few months. She will start daily boost and weigh herself once per week. If continuing to lose weight she will increase boost to twice daily and return in 3 weeks if no improvement.

## 2020-06-30 NOTE — Patient Instructions (Addendum)
#  memory - would recommend watch and wait - focus on getting good sleep, regular exercise, and eating plan - continue neurology care  #Weight loss - Weigh yourself today  - Drink 1 boost every day - try to eat something small mid-day  - weigh yourself next week - if you have lost weight - start drinking boost in the morning and at lunch - Return for check-in if weight is still decreasing in 3 weeks  #Blood pressure - Try to monitor at home a few times per week - follow-up with me or cardiology if you notice your blood pressure >140/90   #Cough/congestion - Use Albuterol inhaler as need for cough or 2-3 times a day for the next 1-2 days - if no improvement or if worsening call the clinic and could try a course of prednisone

## 2020-06-30 NOTE — Progress Notes (Signed)
Cardiology Office Note:    Date:  06/30/2020   ID:  Barbara Krause, DOB 03-23-1941, MRN [id number]  PCP:  [name], MD  [facility] Cardiologist:  Kate Sable, MD  [place] Electrophysiologist:  None   Referring MD: [name], MD   Chief Complaint  Patient presents with  . Follow-up    1 Month follow up. Medications verbally reviewed with patient.     History of Present Illness:    Barbara Krause is a 79 y.o. female with a hx of hypertension, Huntington's disease, osteoporosis, current smoker x50+ years who presents for follow-up.  She was last seen due to elevated blood pressures.  Losartan was increased to 50 mg daily.  She has tolerated the medicine okay.  Patient does not check her blood pressure at home.  She presents today with daughter.  Had an episode of a fall roughly 2 weeks ago.  Daughter not sure if it was due to orthostasis or because of neurologic disease/Huntington's.  She otherwise feels okay, with no concerns at this time.  Historical notes Patient will be evaluated for a new medication trial to manage Huntington's disease a week and a half ago.  During this evaluation, her blood pressures were noted to be elevated with systolic in the 431V.  She could not be enrolled in the trial.  She denies any history of heart disease, denies chest pain no shortness of breath at rest or with exertion.  She has no other complaints today.  Would like to have her blood pressure controlled so she could be called to the for the trial. .  Past Medical History:  Diagnosis Date  . Breast cancer ([place])    bilateral  . Depression   . History of chicken pox   . History of colon polyps   . 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[place])   . Kidney stone   . Osteoporosis     Past Surgical History:  Procedure Laterality Date  . [place]  . CHOLECYSTECTOMY    . TOTAL MASTECTOMY Bilateral 2009    Current Medications: Current Meds  Medication Sig  .  acetaminophen (TYLENOL) 325 MG tablet Take 2 tablets (650 mg total) by mouth every 6 (six) hours as needed for mild pain (or Fever >/= 101).  Marland Kitchen alendronate (FOSAMAX) 70 MG tablet Take 70 mg by mouth once a week.   Marland Kitchen atorvastatin (LIPITOR) 10 MG tablet Take 1 tablet (10 mg total) by mouth daily.  . AUSTEDO 12 MG TABS Take 12 mg by mouth 2 (two) times a day.   . Deutetrabenazine 6 MG TABS Take by mouth.  . donepezil (ARICEPT) 5 MG tablet Take 1 tablet by mouth daily.  Marland Kitchen escitalopram (LEXAPRO) 20 MG tablet TAKE 1 TABLET BY MOUTH EVERY DAY (Patient taking differently: Take 20 mg by mouth daily. )  . losartan (COZAAR) 25 MG tablet Take 1 tablet (25 mg total) by mouth daily.  . Vitamin D, Ergocalciferol, (DRISDOL) 1.25 MG (50000 UT) CAPS capsule Take 1 capsule (50,000 Units total) by mouth every 7 (seven) days. Due for follow up in November. Please schedule  . [DISCONTINUED] losartan (COZAAR) 50 MG tablet Take 1 tablet (50 mg total) by mouth daily.     Allergies:   Penicillin g   Social History   Socioeconomic History  . Marital status: Married    Spouse name: Fritz Pickerel  . Number of children: 2  . Years of education: Master's Degree in  SW  . Highest education level: Not on file  Occupational History  . Not on file  Tobacco Use  . Smoking status: Current Every Day Smoker    Packs/day: 0.75    Years: 50.00    Pack years: 37.50    Types: Cigarettes  . Smokeless tobacco: Never Used  Vaping Use  . Vaping Use: Never used  Substance and Sexual Activity  . Alcohol use: Yes    Comment: about 1 glass of wine x 2 a week  . Drug use: Never  . Sexual activity: Not Currently  Other Topics Concern  . 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 from [place] to [place] to be near family   Enjoys: smoking, going to the movies and out to eat, seeing theater   Exercise: not currently   Diet: eats breakfast and dinner, but not much during  the day   Social Determinants of Radio broadcast assistant Strain:   . Difficulty of Paying Living Expenses: Not on file  Food Insecurity:   . Worried About Charity fundraiser in the Last Year: Not on file  . Ran Out of Food in the Last Year: Not on file  Transportation Needs:   . Lack of Transportation (Medical): Not on file  . Lack of Transportation (Non-Medical): Not on file  Physical Activity:   . Days of Exercise per Week: Not on file  . Minutes of Exercise per Session: Not on file  Stress:   . Feeling of Stress : Not on file  Social Connections:   . Frequency of Communication with Friends and Family: Not on file  . Frequency of Social Gatherings with Friends and Family: Not on file  . Attends Religious Services: Not on file  . Active Member of Clubs or Organizations: Not on file  . Attends Archivist Meetings: Not on file  . Marital Status: Not on file     Family History: The patient's family history includes Colon cancer in her mother and paternal grandmother; Diabetes in her maternal grandfather and paternal grandfather; Healthy in her daughter and daughter; Heart disease in her father; Ovarian cancer in her sister; Stomach cancer in her maternal grandmother; Stroke in her father.  ROS:   Please see the history of present illness.     All other systems reviewed and are negative.  EKGs/Labs/Other Studies Reviewed:    The following studies were reviewed today:   EKG:  EKG is  ordered today.  The ekg ordered today demonstrates normal sinus rhythm, heart rate 72  Recent Labs: 06/15/2020: BUN 19; Creatinine, Ser 0.86; Potassium 4.5; Sodium 140 06/16/2020: Hemoglobin 12.8; Platelets 181  Recent Lipid Panel    Component Value Date/Time   CHOL 191 11/27/2018 0000   TRIG 134 11/27/2018 0000   HDL 79 (A) 11/27/2018 0000   LDLCALC 108 11/27/2018 0000    Physical Exam:    VS:  BP 92/70 (BP Location: Left Arm, Patient Position: Sitting, Cuff Size: Normal)    Pulse 72   Ht 5\' 7"  (1.702 m)   Wt 110 lb 9.6 oz (50.2 kg)   SpO2 95%   BMI 17.32 kg/m     Wt Readings from Last 3 Encounters:  06/30/20 110 lb 9.6 oz (50.2 kg)  06/15/20 182 lb (82.6 kg)  05/22/20 117 lb (53.1 kg)     GEN:  Well nourished, well  developed in no acute distress HEENT: Normal NECK: No JVD; No carotid bruits LYMPHATICS: No lymphadenopathy CARDIAC: RRR, no murmurs, rubs, gallops RESPIRATORY:  Clear to auscultation without rales, wheezing or rhonchi  ABDOMEN: Soft, non-tender, non-distended MUSCULOSKELETAL:  No edema; No deformity  SKIN: Warm and dry NEUROLOGIC:  Alert and oriented x 3 PSYCHIATRIC:  Normal affect   ASSESSMENT:    1. Essential hypertension   2. Smoking   3. Pure hypercholesterolemia    PLAN:    In order of problems listed above:  1. Patient with history hypertension.  Blood pressure is low normal today.  Had a recent falls, not sure if is due to low BP or Huntington's.  Decrease losartan to 25 mg daily.  Patient advised to check blood pressure at home frequently. 2. Current smoker x50+ years.  Smoking cessation advised.  3. Patient with history of hyperlipidemia, continue statin  Follow-up in 3 month  This note was generated in part or whole with voice recognition software. Voice recognition is usually quite accurate but there are transcription errors that can and very often do occur. I apologize for any typographical errors that were not detected and corrected.  Medication Adjustments/Labs and Tests Ordered: Current medicines are reviewed at length with the patient today.  Concerns regarding medicines are outlined above.  Orders Placed This Encounter  Procedures  . EKG 12-Lead   Meds ordered this encounter  Medications  . losartan (COZAAR) 25 MG tablet    Sig: Take 1 tablet (25 mg total) by mouth daily.    Dispense:  30 tablet    Refill:  5    Patient Instructions  Medication Instructions:  Your physician has recommended you  make the following change in your medication:   1. DECREASE your losartan (COZAAR) to:  Take 1 tablet (25 mg total) by mouth daily.  *If you need a refill on your cardiac medications before your next appointment, please call your pharmacy*   Lab Work: None Ordered If you have labs (blood work) drawn today and your tests are completely normal, you will receive your results only by: Marland Kitchen MyChart Message (if you have MyChart) OR . A paper copy in the mail If you have any lab test that is abnormal or we need to change your treatment, we will call you to review the results.   Testing/Procedures: None Ordered   Follow-Up: At [facility], you and your health needs are our priority.  As part of our continuing mission to provide you with exceptional heart care, we have created designated Provider Care Teams.  These Care Teams include your primary Cardiologist (physician) and Advanced Practice Providers (APPs -  Physician Assistants and Nurse Practitioners) who all work together to provide you with the care you need, when you need it.  We recommend signing up for the patient portal called "MyChart".  Sign up information is provided on this After Visit Summary.  MyChart is used to connect with patients for Virtual Visits (Telemedicine).  Patients are able to view lab/test results, encounter notes, upcoming appointments, etc.  Non-urgent messages can be sent to your provider as well.   To learn more about what you can do with MyChart, go to [email or web address].    Your next appointment:   3 month(s)  The format for your next appointment:   In Person  Provider:   Kate Sable, MD   Other Instructions      Signed, Kate Sable, MD  06/30/2020 12:35 PM    [facility]

## 2020-06-30 NOTE — Assessment & Plan Note (Signed)
Complaining of chest congestion for several weeks. No wheezing on exam and CXR w/o signs of infection. Advised albuterol trial and if no improvement may consider course of steroids, though pt notes overall mild symptoms

## 2020-07-03 DIAGNOSIS — Z0279 Encounter for issue of other medical certificate: Secondary | ICD-10-CM

## 2020-07-04 ENCOUNTER — Encounter: Payer: Self-pay | Admitting: Family Medicine

## 2020-07-04 ENCOUNTER — Telehealth: Payer: Self-pay | Admitting: Family Medicine

## 2020-07-04 DIAGNOSIS — G10 Huntington's disease: Secondary | ICD-10-CM

## 2020-07-04 DIAGNOSIS — S06360D Traumatic hemorrhage of cerebrum, unspecified, without loss of consciousness, subsequent encounter: Secondary | ICD-10-CM

## 2020-07-04 NOTE — Telephone Encounter (Signed)
[facility] paperwork on cart to be deliver to dr cody

## 2020-07-08 NOTE — Telephone Encounter (Signed)
Signed and placed in box for pick up

## 2020-07-09 NOTE — Telephone Encounter (Signed)
One charge for both 29 loreen aware paperwork is going to be faxed  [place] paperwork to email and

## 2020-07-11 ENCOUNTER — Telehealth: Payer: Self-pay

## 2020-07-11 NOTE — Telephone Encounter (Signed)
Patient's daughter called and requested that home health orders be sent to [facility]. Their phone # is [phone].  Daughter would like c/b once this has been completed. [phone] Thank you!

## 2020-07-14 NOTE — Telephone Encounter (Signed)
We are in the process of working with [facility] to see if able to start services.

## 2020-07-14 NOTE — Telephone Encounter (Signed)
We are in the process of working with [facility] agencies requested to see if able to start services.

## 2020-07-15 ENCOUNTER — Telehealth: Payer: Self-pay

## 2020-07-15 NOTE — Telephone Encounter (Signed)
Barbara Krause from [facility] called and left a message on triage line stating the pt had a fall on 9-12. Got up off the couch and lost her balance and fell. Said she had no injuries. Any questions call [phone].

## 2020-07-15 NOTE — Telephone Encounter (Signed)
Noted. Cont with PT support

## 2020-07-15 NOTE — Telephone Encounter (Signed)
Referral sent to [facility] - faxed to [phone]

## 2020-07-18 ENCOUNTER — Telehealth: Payer: Self-pay

## 2020-07-18 NOTE — Telephone Encounter (Signed)
Left detailed message on Barbara Krause's ([facility] therapist) VM relaying the verbal orders for speech therapy 1 x a week for 4 weeks.

## 2020-07-18 NOTE — Telephone Encounter (Signed)
[place] speech therapist with [facility] requesting verbal orders for [facility] speech therapy 1 x a week for 4 weeks that is effective 07/17/20.

## 2020-07-18 NOTE — Telephone Encounter (Signed)
Verbal orders per request

## 2020-08-22 ENCOUNTER — Other Ambulatory Visit: Payer: Self-pay | Admitting: *Deleted

## 2020-08-22 DIAGNOSIS — Z122 Encounter for screening for malignant neoplasm of respiratory organs: Secondary | ICD-10-CM

## 2020-08-22 DIAGNOSIS — Z87891 Personal history of nicotine dependence: Secondary | ICD-10-CM

## 2020-08-22 NOTE — Progress Notes (Signed)
Current, 37.5 pack year history

## 2020-08-27 ENCOUNTER — Other Ambulatory Visit: Payer: Self-pay

## 2020-08-27 ENCOUNTER — Ambulatory Visit
Admission: RE | Admit: 2020-08-27 | Discharge: 2020-08-27 | Disposition: A | Discharge status: Home / Self Care | Payer: Medicare Other | Source: Ambulatory Visit | Attending: Nurse Practitioner | Admitting: Nurse Practitioner

## 2020-08-27 DIAGNOSIS — Z122 Encounter for screening for malignant neoplasm of respiratory organs: Secondary | ICD-10-CM

## 2020-08-27 DIAGNOSIS — Z87891 Personal history of nicotine dependence: Secondary | ICD-10-CM

## 2020-08-31 ENCOUNTER — Encounter: Payer: Self-pay | Admitting: *Deleted

## 2020-09-11 ENCOUNTER — Other Ambulatory Visit: Payer: Self-pay | Admitting: Family Medicine

## 2020-09-11 DIAGNOSIS — I700 Atherosclerosis of aorta: Secondary | ICD-10-CM

## 2020-09-25 ENCOUNTER — Telehealth: Payer: Self-pay | Admitting: Family Medicine

## 2020-09-25 DIAGNOSIS — E559 Vitamin D deficiency, unspecified: Secondary | ICD-10-CM

## 2020-09-29 NOTE — Telephone Encounter (Signed)
Needs a medicare wellness appt please.

## 2020-10-02 ENCOUNTER — Ambulatory Visit (INDEPENDENT_AMBULATORY_CARE_PROVIDER_SITE_OTHER): Payer: Medicare Other | Admitting: Cardiology

## 2020-10-02 ENCOUNTER — Other Ambulatory Visit: Payer: Self-pay

## 2020-10-02 ENCOUNTER — Encounter: Payer: Self-pay | Admitting: Cardiology

## 2020-10-02 VITALS — BP 118/86 | HR 74 | Ht 67.0 in | Wt 113.0 lb

## 2020-10-02 DIAGNOSIS — F17200 Nicotine dependence, unspecified, uncomplicated: Secondary | ICD-10-CM

## 2020-10-02 DIAGNOSIS — I10 Essential (primary) hypertension: Secondary | ICD-10-CM

## 2020-10-02 DIAGNOSIS — E7800 Pure hypercholesterolemia, unspecified: Secondary | ICD-10-CM

## 2020-10-02 NOTE — Progress Notes (Signed)
Cardiology Office Note:    Date:  10/02/2020   ID:  Barbara Krause, DOB Mar 21, 1941, MRN [id number]  PCP:  [name], MD  [facility] Cardiologist:  Kate Sable, MD  [place] Electrophysiologist:  None   Referring MD: [name], MD   Chief Complaint  Patient presents with  . Follow-up    3 Months follow up and per patient she has no issues or concerns she wishes to discuss at today's visit. Medications verbally reviewed with patient.     History of Present Illness:    Barbara Krause is a 79 y.o. female with a hx of hypertension, Huntington's disease, osteoporosis, current smoker x50+ years who presents for follow-up.  Previously seen due to low normal BPs associated with fall.  Losartan dose was decreased to 25 mg daily.  She has no concerns today.  Blood pressures have been normal with reducing dose of losartan.  Has not had any further episodes of falls since last visit.  Prior notes Patient will be evaluated for a new medication trial to manage Huntington's disease a week and a half ago.  During this evaluation, her blood pressures were noted to be elevated with systolic in the 725D.  She could not be enrolled in the trial.  She denies any history of heart disease, denies chest pain no shortness of breath at rest or with exertion.  .  Would like to have her blood pressure controlled so she could be called to the for the trial. .  Past Medical History:  Diagnosis Date  . Breast cancer ([place])    bilateral  . Depression   . History of chicken pox   . History of colon polyps   . 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[place])   . Kidney stone   . Osteoporosis     Past Surgical History:  Procedure Laterality Date  . [place]  . CHOLECYSTECTOMY    . TOTAL MASTECTOMY Bilateral 2009    Current Medications: Current Meds  Medication Sig  . alendronate (FOSAMAX) 70 MG tablet Take 70 mg by mouth once a week.   Marland Kitchen atorvastatin (LIPITOR) 10 MG tablet  TAKE 1 TABLET BY MOUTH EVERY DAY  . AUSTEDO 12 MG TABS Take 12 mg by mouth 2 (two) times a day.   . Deutetrabenazine 6 MG TABS Take by mouth.  . donepezil (ARICEPT) 5 MG tablet Take 1 tablet by mouth daily.  Marland Kitchen escitalopram (LEXAPRO) 20 MG tablet TAKE 1 TABLET BY MOUTH EVERY DAY (Patient taking differently: Take 20 mg by mouth daily. )  . losartan (COZAAR) 25 MG tablet Take 1 tablet (25 mg total) by mouth daily.  . Vitamin D, Ergocalciferol, (DRISDOL) 1.25 MG (50000 UT) CAPS capsule Take 1 capsule (50,000 Units total) by mouth every 7 (seven) days. Due for follow up in November. Please schedule     Allergies:   Penicillin g   Social History   Socioeconomic History  . Marital status: Married    Spouse name: Fritz Pickerel  . Number of children: 2  . Years of education: Master's Degree in SW  . Highest education level: Not on file  Occupational History  . Not on file  Tobacco Use  . Smoking status: Current Every Day Smoker    Packs/day: 0.75    Years: 50.00    Pack years: 37.50    Types: Cigarettes  . Smokeless tobacco: Never Used  Vaping Use  . Vaping Use: Never used  Substance and Sexual Activity  . Alcohol use: Yes    Comment: about 1 glass of wine x 2 a week  . Drug use: Never  . Sexual activity: Not Currently  Other Topics Concern  . 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 from [place] to [place] to be near family   Enjoys: smoking, going to the movies and out to eat, seeing theater   Exercise: not currently   Diet: eats breakfast and dinner, but not much during the day   Social Determinants of Radio broadcast assistant Strain:   . Difficulty of Paying Living Expenses: Not on file  Food Insecurity:   . Worried About Charity fundraiser in the Last Year: Not on file  . Ran Out of Food in the Last Year: Not on file  Transportation Needs:   . Lack of Transportation (Medical): Not on file  . Lack of  Transportation (Non-Medical): Not on file  Physical Activity:   . Days of Exercise per Week: Not on file  . Minutes of Exercise per Session: Not on file  Stress:   . Feeling of Stress : Not on file  Social Connections:   . Frequency of Communication with Friends and Family: Not on file  . Frequency of Social Gatherings with Friends and Family: Not on file  . Attends Religious Services: Not on file  . Active Member of Clubs or Organizations: Not on file  . Attends Archivist Meetings: Not on file  . Marital Status: Not on file     Family History: The patient's family history includes Colon cancer in her mother and paternal grandmother; Diabetes in her maternal grandfather and paternal grandfather; Healthy in her daughter and daughter; Heart disease in her father; Ovarian cancer in her sister; Stomach cancer in her maternal grandmother; Stroke in her father.  ROS:   Please see the history of present illness.     All other systems reviewed and are negative.  EKGs/Labs/Other Studies Reviewed:    The following studies were reviewed today:   EKG:  EKG not ordered today.   Recent Labs: 06/15/2020: BUN 19; Creatinine, Ser 0.86; Potassium 4.5; Sodium 140 06/16/2020: Hemoglobin 12.8; Platelets 181  Recent Lipid Panel    Component Value Date/Time   CHOL 191 11/27/2018 0000   TRIG 134 11/27/2018 0000   HDL 79 (A) 11/27/2018 0000   LDLCALC 108 11/27/2018 0000    Physical Exam:    VS:  BP 118/86 (BP Location: Right Arm, Patient Position: Sitting, Cuff Size: Normal)   Pulse 74   Ht 5\' 7"  (1.702 m)   Wt 113 lb (51.3 kg)   BMI 17.70 kg/m     Wt Readings from Last 3 Encounters:  10/02/20 113 lb (51.3 kg)  08/27/20 111 lb (50.3 kg)  06/30/20 111 lb (50.3 kg)     GEN:  Well nourished, well developed in no acute distress HEENT: Normal NECK: No JVD; No carotid bruits LYMPHATICS: No lymphadenopathy CARDIAC: RRR, no murmurs, rubs, gallops RESPIRATORY:  Clear to  auscultation without rales, wheezing or rhonchi  ABDOMEN: Soft, non-tender, non-distended MUSCULOSKELETAL:  No edema; No deformity  SKIN: Warm and dry NEUROLOGIC:  Alert and oriented x 3 PSYCHIATRIC:  Normal affect   ASSESSMENT:    1. Essential hypertension   2. Smoking   3. Pure hypercholesterolemia    PLAN:    In  order of problems listed above:  1. Patient with history hypertension.  Blood pressure controlled at current dose of losartan.  Continue losartan 25 mg daily.. 2. Current smoker x50+ years.  Smoking cessation advised.  3. hyperlipidemia, continue statin  Follow-up as needed  This note was generated in part or whole with voice recognition software. Voice recognition is usually quite accurate but there are transcription errors that can and very often do occur. I apologize for any typographical errors that were not detected and corrected.  Medication Adjustments/Labs and Tests Ordered: Current medicines are reviewed at length with the patient today.  Concerns regarding medicines are outlined above.  No orders of the defined types were placed in this encounter.  No orders of the defined types were placed in this encounter.   Patient Instructions  Medication Instructions:  Your physician recommends that you continue on your current medications as directed. Please refer to the Current Medication list given to you today.  *If you need a refill on your cardiac medications before your next appointment, please call your pharmacy*   Lab Work: One Ordered If you have labs (blood work) drawn today and your tests are completely normal, you will receive your results only by: Marland Kitchen MyChart Message (if you have MyChart) OR . A paper copy in the mail If you have any lab test that is abnormal or we need to change your treatment, we will call you to review the results.   Testing/Procedures: None Ordered   Follow-Up: At [facility], you and your health needs are our priority.   As part of our continuing mission to provide you with exceptional heart care, we have created designated Provider Care Teams.  These Care Teams include your primary Cardiologist (physician) and Advanced Practice Providers (APPs -  Physician Assistants and Nurse Practitioners) who all work together to provide you with the care you need, when you need it.  We recommend signing up for the patient portal called "MyChart".  Sign up information is provided on this After Visit Summary.  MyChart is used to connect with patients for Virtual Visits (Telemedicine).  Patients are able to view lab/test results, encounter notes, upcoming appointments, etc.  Non-urgent messages can be sent to your provider as well.   To learn more about what you can do with MyChart, go to [email or web address].    Your next appointment:   Follow up as needed   The format for your next appointment:   In Person  Provider:   Kate Sable, MD   Other Instructions      Signed, Kate Sable, MD  10/02/2020 12:34 PM    [place]

## 2020-10-02 NOTE — Patient Instructions (Signed)
Medication Instructions:  Your physician recommends that you continue on your current medications as directed. Please refer to the Current Medication list given to you today.  *If you need a refill on your cardiac medications before your next appointment, please call your pharmacy*   Lab Work: One Ordered If you have labs (blood work) drawn today and your tests are completely normal, you will receive your results only by: Marland Kitchen MyChart Message (if you have MyChart) OR . A paper copy in the mail If you have any lab test that is abnormal or we need to change your treatment, we will call you to review the results.   Testing/Procedures: None Ordered   Follow-Up: At [facility], you and your health needs are our priority.  As part of our continuing mission to provide you with exceptional heart care, we have created designated Provider Care Teams.  These Care Teams include your primary Cardiologist (physician) and Advanced Practice Providers (APPs -  Physician Assistants and Nurse Practitioners) who all work together to provide you with the care you need, when you need it.  We recommend signing up for the patient portal called "MyChart".  Sign up information is provided on this After Visit Summary.  MyChart is used to connect with patients for Virtual Visits (Telemedicine).  Patients are able to view lab/test results, encounter notes, upcoming appointments, etc.  Non-urgent messages can be sent to your provider as well.   To learn more about what you can do with MyChart, go to [email or web address].    Your next appointment:   Follow up as needed   The format for your next appointment:   In Person  Provider:   Kate Sable, MD   Other Instructions

## 2020-10-02 NOTE — Telephone Encounter (Signed)
Called to schedule AWV. LVM to call back.

## 2020-10-09 NOTE — Telephone Encounter (Signed)
Called patient and scheduled for AWV with nurse and PCP. Please advise for refill

## 2020-11-18 ENCOUNTER — Ambulatory Visit (INDEPENDENT_AMBULATORY_CARE_PROVIDER_SITE_OTHER): Payer: Medicare Other

## 2020-11-18 DIAGNOSIS — Z0000 Encounter for general adult medical examination without abnormal findings: Secondary | ICD-10-CM

## 2020-11-18 NOTE — Progress Notes (Signed)
PCP notes:  Health Maintenance: COVID booster due   Abnormal Screenings: none   Patient concerns: none   Nurse concerns: none   Next PCP appt.: 11/24/2020 @ 3:40 pm

## 2020-11-18 NOTE — Patient Instructions (Signed)
Barbara Krause , Thank you for taking time to come for your Medicare Wellness Visit. I appreciate your ongoing commitment to your health goals. Please review the following plan we discussed and let me know if I can assist you in the future.   Screening recommendations/referrals: Colonoscopy: no longer required Mammogram: no longer required Bone Density: Up to date, completed 11/06/2019, due 11/2021 Recommended yearly ophthalmology/optometry visit for glaucoma screening and checkup Recommended yearly dental visit for hygiene and checkup  Vaccinations: Influenza vaccine: Up to date, completed 09/05/2020, due 06/2021 Pneumococcal vaccine: Completed series Tdap vaccine: Up to date, completed 06/15/2020, due 06/2030 Shingles vaccine: Completed series   Covid-19: completed 2 doses, booster due  Advanced directives: copy in chart  Conditions/risks identified: hypertension  Next appointment: Follow up in one year for your annual wellness visit    Preventive Care 78 Years and Older, Female Preventive care refers to lifestyle choices and visits with your health care provider that can promote health and wellness. What does preventive care include?  A yearly physical exam. This is also called an annual well check.  Dental exams once or twice a year.  Routine eye exams. Ask your health care provider how often you should have your eyes checked.  Personal lifestyle choices, including:  Daily care of your teeth and gums.  Regular physical activity.  Eating a healthy diet.  Avoiding tobacco and drug use.  Limiting alcohol use.  Practicing safe sex.  Taking low-dose aspirin every day.  Taking vitamin and mineral supplements as recommended by your health care provider. What happens during an annual well check? The services and screenings done by your health care provider during your annual well check will depend on your age, overall health, lifestyle risk factors, and family history of  disease. Counseling  Your health care provider may ask you questions about your:  Alcohol use.  Tobacco use.  Drug use.  Emotional well-being.  Home and relationship well-being.  Sexual activity.  Eating habits.  History of falls.  Memory and ability to understand (cognition).  Work and work Statistician.  Reproductive health. Screening  You may have the following tests or measurements:  Height, weight, and BMI.  Blood pressure.  Lipid and cholesterol levels. These may be checked every 5 years, or more frequently if you are over 51 years old.  Skin check.  Lung cancer screening. You may have this screening every year starting at age 78 if you have a 30-pack-year history of smoking and currently smoke or have quit within the past 15 years.  Fecal occult blood test (FOBT) of the stool. You may have this test every year starting at age 78.  Flexible sigmoidoscopy or colonoscopy. You may have a sigmoidoscopy every 5 years or a colonoscopy every 10 years starting at age 78.  Hepatitis C blood test.  Hepatitis B blood test.  Sexually transmitted disease (STD) testing.  Diabetes screening. This is done by checking your blood sugar (glucose) after you have not eaten for a while (fasting). You may have this done every 1-3 years.  Bone density scan. This is done to screen for osteoporosis. You may have this done starting at age 78.  Mammogram. This may be done every 1-2 years. Talk to your health care provider about how often you should have regular mammograms. Talk with your health care provider about your test results, treatment options, and if necessary, the need for more tests. Vaccines  Your health care provider may recommend certain vaccines, such as:  Influenza vaccine. This is recommended every year.  Tetanus, diphtheria, and acellular pertussis (Tdap, Td) vaccine. You may need a Td booster every 10 years.  Zoster vaccine. You may need this after age  78.  Pneumococcal 13-valent conjugate (PCV13) vaccine. One dose is recommended after age 54.  Pneumococcal polysaccharide (PPSV23) vaccine. One dose is recommended after age 41. Talk to your health care provider about which screenings and vaccines you need and how often you need them. This information is not intended to replace advice given to you by your health care provider. Make sure you discuss any questions you have with your health care provider. Document Released: 11/14/2015 Document Revised: 07/07/2016 Document Reviewed: 08/19/2015 Elsevier Interactive Patient Education  2017 [place] Prevention in the Home Falls can cause injuries. They can happen to people of all ages. There are many things you can do to make your home safe and to help prevent falls. What can I do on the outside of my home?  Regularly fix the edges of walkways and driveways and fix any cracks.  Remove anything that might make you trip as you walk through a door, such as a raised step or threshold.  Trim any bushes or trees on the path to your home.  Use bright outdoor lighting.  Clear any walking paths of anything that might make someone trip, such as rocks or tools.  Regularly check to see if handrails are loose or broken. Make sure that both sides of any steps have handrails.  Any raised decks and porches should have guardrails on the edges.  Have any leaves, snow, or ice cleared regularly.  Use sand or salt on walking paths during winter.  Clean up any spills in your garage right away. This includes oil or grease spills. What can I do in the bathroom?  Use night lights.  Install grab bars by the toilet and in the tub and shower. Do not use towel bars as grab bars.  Use non-skid mats or decals in the tub or shower.  If you need to sit down in the shower, use a plastic, non-slip stool.  Keep the floor dry. Clean up any water that spills on the floor as soon as it happens.  Remove  soap buildup in the tub or shower regularly.  Attach bath mats securely with double-sided non-slip rug tape.  Do not have throw rugs and other things on the floor that can make you trip. What can I do in the bedroom?  Use night lights.  Make sure that you have a light by your bed that is easy to reach.  Do not use any sheets or blankets that are too big for your bed. They should not hang down onto the floor.  Have a firm chair that has side arms. You can use this for support while you get dressed.  Do not have throw rugs and other things on the floor that can make you trip. What can I do in the kitchen?  Clean up any spills right away.  Avoid walking on wet floors.  Keep items that you use a lot in easy-to-reach places.  If you need to reach something above you, use a strong step stool that has a grab bar.  Keep electrical cords out of the way.  Do not use floor polish or wax that makes floors slippery. If you must use wax, use non-skid floor wax.  Do not have throw rugs and other things on the floor that  can make you trip. What can I do with my stairs?  Do not leave any items on the stairs.  Make sure that there are handrails on both sides of the stairs and use them. Fix handrails that are broken or loose. Make sure that handrails are as long as the stairways.  Check any carpeting to make sure that it is firmly attached to the stairs. Fix any carpet that is loose or worn.  Avoid having throw rugs at the top or bottom of the stairs. If you do have throw rugs, attach them to the floor with carpet tape.  Make sure that you have a light switch at the top of the stairs and the bottom of the stairs. If you do not have them, ask someone to add them for you. What else can I do to help prevent falls?  Wear shoes that:  Do not have high heels.  Have rubber bottoms.  Are comfortable and fit you well.  Are closed at the toe. Do not wear sandals.  If you use a  stepladder:  Make sure that it is fully opened. Do not climb a closed stepladder.  Make sure that both sides of the stepladder are locked into place.  Ask someone to hold it for you, if possible.  Clearly mark and make sure that you can see:  Any grab bars or handrails.  First and last steps.  Where the edge of each step is.  Use tools that help you move around (mobility aids) if they are needed. These include:  Canes.  Walkers.  Scooters.  Crutches.  Turn on the lights when you go into a dark area. Replace any light bulbs as soon as they burn out.  Set up your furniture so you have a clear path. Avoid moving your furniture around.  If any of your floors are uneven, fix them.  If there are any pets around you, be aware of where they are.  Review your medicines with your doctor. Some medicines can make you feel dizzy. This can increase your chance of falling. Ask your doctor what other things that you can do to help prevent falls. This information is not intended to replace advice given to you by your health care provider. Make sure you discuss any questions you have with your health care provider. Document Released: 08/14/2009 Document Revised: 03/25/2016 Document Reviewed: 11/22/2014 Elsevier Interactive Patient Education  2017 [employer].

## 2020-11-18 NOTE — Progress Notes (Signed)
Subjective:   Barbara Krause is a 80 y.o. female who presents for Medicare Annual (Subsequent) preventive examination.  Review of Systems: N/A      I connected with the patient today by telephone and verified that I am speaking with the correct person using two identifiers. Location patient: home Location nurse: work Persons participating in the telephone visit: patient, nurse.   I discussed the limitations, risks, security and privacy concerns of performing an evaluation and management service by telephone and the availability of in person appointments. I also discussed with the patient that there may be a patient responsible charge related to this service. The patient expressed understanding and verbally consented to this telephonic visit.        Cardiac Risk Factors include: advanced age (>34mn, >>4women);hypertension     Objective:    Today's Vitals   There is no height or weight on file to calculate BMI.  Advanced Directives 11/18/2020  Does Patient Have a Medical Advance Directive? Yes  Type of AParamedicof [place] will  Copy of [place] Chart? Yes - validated most recent copy scanned in chart (See row information)    Current Medications (verified) Outpatient Encounter Medications as of 11/18/2020  Medication Sig  . alendronate (FOSAMAX) 70 MG tablet Take 70 mg by mouth once a week.   .Marland Kitchenatorvastatin (LIPITOR) 10 MG tablet TAKE 1 TABLET BY MOUTH EVERY DAY  . AUSTEDO 12 MG TABS Take 12 mg by mouth 2 (two) times a day.   . Deutetrabenazine 6 MG TABS Take by mouth.  . donepezil (ARICEPT) 5 MG tablet Take 1 tablet by mouth daily.  .Marland Kitchenescitalopram (LEXAPRO) 20 MG tablet TAKE 1 TABLET BY MOUTH EVERY DAY (Patient taking differently: Take 20 mg by mouth daily.)  . Vitamin D, Ergocalciferol, (DRISDOL) 1.25 MG (50000 UT) CAPS capsule Take 1 capsule (50,000 Units total) by mouth every 7 (seven) days. Due for follow up in  November. Please schedule  . losartan (COZAAR) 25 MG tablet Take 1 tablet (25 mg total) by mouth daily.   No facility-administered encounter medications on file as of 11/18/2020.    Allergies (verified) Penicillin g   History: Past Medical History:  Diagnosis Date  . Breast cancer ([place]    bilateral  . Depression   . History of chicken pox   . History of colon polyps   . 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 Kidney stone   . Osteoporosis    Past Surgical History:  Procedure Laterality Date  . [place] CHOLE[place]    . TOTAL MASTECTOMY Bilateral 2009   Family History  Problem Relation Age of Onset  . Colon cancer Mother   . Stroke Father   . Heart disease Father   . Ovarian cancer Sister   . Healthy Daughter   . Stomach cancer Maternal Grandmother   . Diabetes Maternal Grandfather   . Colon cancer Paternal Grandmother   . Diabetes Paternal Grandfather   . Healthy Daughter    Social History   Socioeconomic History  . Marital status: Married    Spouse name: LFritz Pickerel . Number of children: 2  . Years of education: Master's Degree in SW  . Highest education level: Not on file  Occupational History  . Not on file  Tobacco Use  . Smoking status: Current Every Day Smoker    Packs/day: 0.75    Years: 50.00  Pack years: 37.50    Types: Cigarettes  . Smokeless tobacco: Never Used  Vaping Use  . Vaping Use: Never used  Substance and Sexual Activity  . Alcohol use: Yes    Comment: about 1 glass of wine x 2 a week  . Drug use: Never  . Sexual activity: Not Currently  Other Topics Concern  . 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 from [place] to [place] to be near family   Enjoys: smoking, going to the movies and out to eat, seeing theater   Exercise: not currently   Diet: eats breakfast and dinner, but not much during the day   Social Determinants of Adult nurse Strain: [place]   . Difficulty of Paying Living Expenses: Not hard at all  Food Insecurity: No Food Insecurity  . Worried About Charity fundraiser in the Last Year: Never true  . Ran Out of Food in the Last Year: Never true  Transportation Needs: No Transportation Needs  . Lack of Transportation (Medical): No  . Lack of Transportation (Non-Medical): No  Physical Activity: Inactive  . Days of Exercise per Week: 0 days  . Minutes of Exercise per Session: 0 min  Stress: No Stress Concern Present  . Feeling of Stress : Not at all  Social Connections: Not on file    Tobacco Counseling Ready to quit: Not Answered Counseling given: Not Answered   Clinical Intake:  Pre-visit preparation completed: Yes  Pain : No/denies pain     Nutritional Risks: None Diabetes: No  How often do you need to have someone help you when you read instructions, pamphlets, or other written materials from your doctor or pharmacy?: 1 - Never What is the last grade level you completed in school?: masters in social work  Diabetic: no Nutrition Risk Assessment:  Has the patient had any N/V/D within the last 2 months?  No  Does the patient have any non-healing wounds?  No  Has the patient had any unintentional weight loss or weight gain?  No   Diabetes:  Is the patient diabetic?  No  If diabetic, was a CBG obtained today?  N/A Did the patient bring in their glucometer from home?  N/A How often do you monitor your CBG's? N/A.   Financial Strains and Diabetes Management:  Are you having any financial strains with the device, your supplies or your medication? N/A.  Does the patient want to be seen by Chronic Care Management for management of their diabetes?  N/A Would the patient like to be referred to a Nutritionist or for Diabetic Management?  N/A   Interpreter Needed?: No  Information entered by :: CJohnson, LPN   Activities of Daily Living In your present state of  health, do you have any difficulty performing the following activities: 11/18/2020  Hearing? N  Vision? N  Difficulty concentrating or making decisions? Y  Comment Patient notes some memory issues  Walking or climbing stairs? N  Dressing or bathing? N  Doing errands, shopping? Y  Comment has an Chartered certified accountant and eating ? Y  Comment has an Engineer, production  Using the Toilet? N  In the past six months, have you accidently leaked urine? N  Do you have problems with loss of bowel control? N  Managing your Medications? N  Managing your Finances? N  Housekeeping or managing your  Housekeeping? Y  Comment has an aide  Some recent data might be hidden    Patient Care Team: [name], MD as PCP - General (Family Medicine) Kate Sable, MD as PCP - Cardiology (Cardiology) Tempie Hoist, MD as Referring Physician (Neurology)  Indicate any recent Medical Services you may have received from other than [facility] providers in the past year (date may be approximate).     Assessment:   This is a routine wellness examination for [place].  Hearing/Vision screen  Hearing Screening   125Hz 250Hz 500Hz 1000Hz 2000Hz 3000Hz 4000Hz 6000Hz 8000Hz  Right ear:           Left ear:           Vision Screening Comments: Advised patient to get annual eye exams   Dietary issues and exercise activities discussed: Current Exercise Habits: The patient does not participate in regular exercise at present, Exercise limited by: None identified  Goals    . Patient Stated     11/18/2020, I will maintain and continue medications as prescribed.       Depression Screen PHQ 2/9 Scores 11/18/2020 06/30/2020  PHQ - 2 Score 0 0  PHQ- 9 Score 0 0    Fall Risk Fall Risk  11/18/2020 06/30/2020  Falls in the past year? 1 1  Number falls in past yr: 1 1  Injury with Fall? 1 1  Comment had to go to the hospital with head injury -  Risk for fall due to : History of fall(s) History of fall(s);Impaired  balance/gait;Impaired mobility  Follow up Falls evaluation completed;Falls prevention discussed Falls evaluation completed    FALL RISK PREVENTION PERTAINING TO THE HOME:  Any stairs in or around the home? Yes  If so, are there any without handrails? No  Home free of loose throw rugs in walkways, pet beds, electrical cords, etc? Yes  Adequate lighting in your home to reduce risk of falls? Yes   ASSISTIVE DEVICES UTILIZED TO PREVENT FALLS:  Life alert? No  Use of a cane, walker or w/c? Yes  Grab bars in the bathroom? No  Shower chair or bench in shower? No  Elevated toilet seat or a handicapped toilet? No   TIMED UP AND GO:  Was the test performed? N/A telephone visit.    Cognitive Function: MMSE - Mini Mental State Exam 11/18/2020  Orientation to time 5  Orientation to Place 5  Registration 3  Attention/ Calculation 5  Recall 3  Language- repeat 1       Mini Cog  Mini-Cog screen was completed. Maximum score is 22. A value of 0 denotes this part of the MMSE was not completed or the patient failed this part of the Mini-Cog screening.  Immunizations Immunization History  Administered Date(s) Administered  . Fluad Quad(high Dose 65+) 08/16/2019, 09/05/2020  . Influenza Inj Mdck Quad Pf 08/16/2019  . Influenza, High Dose Seasonal PF 08/16/2019  . MMR 12/24/2013  . PFIZER(Purple Top)SARS-COV-2 Vaccination 12/13/2019, 01/03/2020  . Pneumococcal Conjugate-13 09/05/2017  . Pneumococcal Polysaccharide-23 09/10/2009  . Tdap 06/02/2015, 06/15/2020  . Zoster 11/04/2011  . Zoster Recombinat (Shingrix) 12/04/2018, 06/26/2019    TDAP status: Up to date  Flu Vaccine status: Up to date  Pneumococcal vaccine status: Up to date  Covid-19 vaccine status: completed 2 doses, booster due. Patient is aware and was given information on how to obtain booster.  Qualifies for Shingles Vaccine? Yes   Zostavax completed Yes   Shingrix Completed?: Yes  Screening  Tests Health  Maintenance  Topic Date Due  . Hepatitis C Screening  Never done  . COVID-19 Vaccine (3 - Booster for Pfizer series) 07/05/2020  . TETANUS/TDAP  06/15/2030  . INFLUENZA VACCINE  Completed  . DEXA SCAN  Completed  . PNA vac Low Risk Adult  Completed    Health Maintenance  Health Maintenance Due  Topic Date Due  . Hepatitis C Screening  Never done  . COVID-19 Vaccine (3 - Booster for Pfizer series) 07/05/2020    Colorectal cancer screening: No longer required.   Mammogram status: No longer required due to bilateral masectomy.  Bone Density status: Completed 11/06/2019. Results reflect: Bone density results: OSTEOPOROSIS. Repeat every 2 years.  Lung Cancer Screening: (Low Dose CT Chest recommended if Age 15-80 years, 30 pack-year currently smoking OR have quit w/in 15years.) does qualify.   Lung Cancer Screening Referral: completed 08/27/2020  Additional Screening:  Hepatitis C Screening: does qualify; Completed due  Vision Screening: Recommended annual ophthalmology exams for early detection of glaucoma and other disorders of the eye. Is the patient up to date with their annual eye exam?  No , does not feel the need to see eye doctor at this time.  Who is the provider or what is the name of the office in which the patient attends annual eye exams? Does not have an eye doctor currently If pt is not established with a provider, would they like to be referred to a provider to establish care? No .   Dental Screening: Recommended annual dental exams for proper oral hygiene  Community Resource Referral / Chronic Care Management: CRR required this visit?  No   CCM required this visit?  No      Plan:     I have personally reviewed and noted the following in the patient's chart:   . Medical and social history . Use of alcohol, tobacco or illicit drugs  . Current medications and supplements . Functional ability and status . Nutritional status . Physical activity . Advanced  directives . List of other physicians . Hospitalizations, surgeries, and ER visits in previous 12 months . Vitals . Screenings to include cognitive, depression, and falls . Referrals and appointments  In addition, I have reviewed and discussed with patient certain preventive protocols, quality metrics, and best practice recommendations. A written personalized care plan for preventive services as well as general preventive health recommendations were provided to patient.   Due to this being a telephonic visit, the after visit summary with patients personalized plan was offered to patient via office or my-chart. Patient preferred to pick up at office at next visit or via mychart.   Andrez Grime, LPN   [id number]

## 2020-11-24 ENCOUNTER — Other Ambulatory Visit: Payer: Self-pay

## 2020-11-24 ENCOUNTER — Encounter: Payer: Self-pay | Admitting: Family Medicine

## 2020-11-24 ENCOUNTER — Ambulatory Visit (INDEPENDENT_AMBULATORY_CARE_PROVIDER_SITE_OTHER): Payer: Medicare Other | Admitting: Family Medicine

## 2020-11-24 ENCOUNTER — Ambulatory Visit (INDEPENDENT_AMBULATORY_CARE_PROVIDER_SITE_OTHER)
Admission: RE | Admit: 2020-11-24 | Discharge: 2020-11-24 | Disposition: A | Discharge status: Home / Self Care | Payer: Medicare Other | Source: Ambulatory Visit | Attending: Family Medicine | Admitting: Family Medicine

## 2020-11-24 VITALS — BP 130/80 | HR 77 | Temp 98.1°F | Ht 66.1 in | Wt 109.0 lb

## 2020-11-24 DIAGNOSIS — G10 Huntington's disease: Secondary | ICD-10-CM | POA: Diagnosis not present

## 2020-11-24 DIAGNOSIS — M25521 Pain in right elbow: Secondary | ICD-10-CM

## 2020-11-24 DIAGNOSIS — R296 Repeated falls: Secondary | ICD-10-CM

## 2020-11-24 DIAGNOSIS — I10 Essential (primary) hypertension: Secondary | ICD-10-CM

## 2020-11-24 DIAGNOSIS — S42401A Unspecified fracture of lower end of right humerus, initial encounter for closed fracture: Secondary | ICD-10-CM | POA: Insufficient documentation

## 2020-11-24 DIAGNOSIS — M810 Age-related osteoporosis without current pathological fracture: Secondary | ICD-10-CM

## 2020-11-24 DIAGNOSIS — I700 Atherosclerosis of aorta: Secondary | ICD-10-CM | POA: Diagnosis not present

## 2020-11-24 DIAGNOSIS — J439 Emphysema, unspecified: Secondary | ICD-10-CM

## 2020-11-24 NOTE — Assessment & Plan Note (Signed)
Follows with endocrinology. Cont fosamax 70 mg. Today with 2nd fracture in ~1 year

## 2020-11-24 NOTE — Assessment & Plan Note (Signed)
Worsening corea on exam today and she notes the movements are worse. Advised reaching out to neurology to discuss symptoms. Cont Austedo 18 mg BID

## 2020-11-24 NOTE — Assessment & Plan Note (Signed)
Fall this morning. Encouraged grab bars in the bathroom for future safety. Displaced fracture. Osteoporosis. Placed in sling. Ibuprofen/tyenol. Urgent referral to ortho for evaluation.

## 2020-11-24 NOTE — Assessment & Plan Note (Signed)
Cont asa 81 mg and atorvastatin 10 mg

## 2020-11-24 NOTE — Patient Instructions (Signed)
Call neurology to update them on your symptoms  Referral to ortho for your arm - you should get a phone call tomorrow. If not, you can call [facility] [place] [facility]

## 2020-11-24 NOTE — Assessment & Plan Note (Signed)
Follows with Dr. Garen Lah. Controlled. Cont losartan 25 mg

## 2020-11-24 NOTE — Assessment & Plan Note (Signed)
Breathing stable. She is not interested in stopping smoking

## 2020-11-24 NOTE — Progress Notes (Signed)
Subjective:     Barbara Krause is a 80 y.o. female presenting for Medicare Wellness, Fall (Today. R arm pain ), and Referral (PT )     HPI  #Fall - well bending over in the bathroom - having arm pain - has been using a walker since her last fall  - not working with PT currently - would like another referral  - right arm pain - from the wrist through the elbow - not sure if she sprained or broke something    Review of Systems   Social History   Tobacco Use  Smoking Status Current Every Day Smoker  . Packs/day: 0.75  . Years: 50.00  . Pack years: 37.50  . Types: Cigarettes  Smokeless Tobacco Never Used        Objective:    BP Readings from Last 3 Encounters:  11/24/20 130/80  10/02/20 118/86  06/30/20 120/82   Wt Readings from Last 3 Encounters:  11/24/20 109 lb (49.4 kg)  10/02/20 113 lb (51.3 kg)  08/27/20 111 lb (50.3 kg)    BP 130/80   Pulse 77   Temp 98.1 F (36.7 C) (Temporal)   Ht 5' 6.1" (1.679 m)   Wt 109 lb (49.4 kg)   SpO2 98%   BMI 17.54 kg/m    Physical Exam Constitutional:      General: She is not in acute distress.    Appearance: She is well-developed. She is not diaphoretic.  HENT:     Right Ear: External ear normal.     Left Ear: External ear normal.     Nose: Nose normal.  Eyes:     Conjunctiva/sclera: Conjunctivae normal.  Cardiovascular:     Rate and Rhythm: Normal rate and regular rhythm.     Pulses: Normal pulses.     Heart sounds: No murmur heard.   Pulmonary:     Effort: Pulmonary effort is normal. No respiratory distress.     Breath sounds: Normal breath sounds. No wheezing.  Musculoskeletal:     Cervical back: Neck supple.     Comments: Right Arm Inspection: no swelling, Arm held in slightly bent and by the side position ROM: difficulty lifting the arm, or flexing/extending the wrist or bending/straighting the elbow 2/2 to pain Palpation: ttp along the elbow and humerus  Strength: decreased elbow and  wrist strength  Skin:    General: Skin is warm and dry.     Capillary Refill: Capillary refill takes less than 2 seconds.  Neurological:     Mental Status: She is alert. Mental status is at baseline.  Psychiatric:        Mood and Affect: Mood normal.        Behavior: Behavior normal.     XR Right elbow: displaced distal humerus fracture     Assessment & Plan:   Problem List Items Addressed This Visit      Cardiovascular and Mediastinum   HTN (hypertension)    Follows with Dr. Garen Lah. Controlled. Cont losartan 25 mg      Aortic atherosclerosis (HCC)    Cont asa 81 mg and atorvastatin 10 mg        Respiratory   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HCC)    Breathing stable. 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 Advised reaching out to  neurology to discuss symptoms. Cont Austedo 18 mg BID      Relevant Orders   Ambulatory referral to [facility] with endocrinology. Cont fosamax 70 mg. Today with 2nd fracture in ~1 year      Closed fracture dislocation of right elbow    Fall this morning. Encouraged grab bars in the bathroom for future safety. Displaced fracture. Osteoporosis. Placed in sling. Ibuprofen/tyenol. Urgent referral to ortho for evaluation.       Relevant Orders   Ambulatory referral to Orthopedic Surgery     Other   Recurrent falls    Several falls in the last year and now using walker. Likely 2/2 to worsening huntington's disease. HH PT to help. Today with new fracture      Relevant Orders   Ambulatory referral to [facility]    Other Visit Diagnoses    Right elbow pain       Relevant Orders   DG Elbow Complete Right (Completed)   Ambulatory referral to Orthopedic Surgery       Return in about 3 months (around 02/22/2021).  [name], MD  This  visit occurred during the SARS-CoV-2 public health emergency.  Safety protocols were in place, including screening questions prior to the visit, additional usage of staff PPE, and extensive cleaning of exam room while observing appropriate contact time as indicated for disinfecting solutions.

## 2020-11-26 DIAGNOSIS — R296 Repeated falls: Secondary | ICD-10-CM | POA: Insufficient documentation

## 2020-11-26 NOTE — Addendum Note (Signed)
Addended by: Waunita Schooner R on: 11/26/2020 11:04 AM   Modules accepted: Orders

## 2020-11-26 NOTE — Assessment & Plan Note (Signed)
Several falls in the last year and now using walker. Likely 2/2 to worsening huntington's disease. HH PT to help. Today with new fracture

## 2020-12-03 DIAGNOSIS — I700 Atherosclerosis of aorta: Secondary | ICD-10-CM

## 2020-12-03 DIAGNOSIS — E440 Moderate protein-calorie malnutrition: Secondary | ICD-10-CM

## 2020-12-03 DIAGNOSIS — F17210 Nicotine dependence, cigarettes, uncomplicated: Secondary | ICD-10-CM

## 2020-12-03 DIAGNOSIS — Z791 Long term (current) use of non-steroidal anti-inflammatories (NSAID): Secondary | ICD-10-CM

## 2020-12-03 DIAGNOSIS — Z8673 Personal history of transient ischemic attack (TIA), and cerebral infarction without residual deficits: Secondary | ICD-10-CM

## 2020-12-03 DIAGNOSIS — E538 Deficiency of other specified B group vitamins: Secondary | ICD-10-CM

## 2020-12-03 DIAGNOSIS — M80031D Age-related osteoporosis with current pathological fracture, right forearm, subsequent encounter for fracture with routine healing: Secondary | ICD-10-CM | POA: Diagnosis not present

## 2020-12-03 DIAGNOSIS — F325 Major depressive disorder, single episode, in full remission: Secondary | ICD-10-CM

## 2020-12-03 DIAGNOSIS — I712 Thoracic aortic aneurysm, without rupture: Secondary | ICD-10-CM

## 2020-12-03 DIAGNOSIS — Z9181 History of falling: Secondary | ICD-10-CM

## 2020-12-03 DIAGNOSIS — Z853 Personal history of malignant neoplasm of breast: Secondary | ICD-10-CM

## 2020-12-03 DIAGNOSIS — I10 Essential (primary) hypertension: Secondary | ICD-10-CM

## 2020-12-03 DIAGNOSIS — J439 Emphysema, unspecified: Secondary | ICD-10-CM | POA: Diagnosis not present

## 2020-12-03 DIAGNOSIS — K219 Gastro-esophageal reflux disease without esophagitis: Secondary | ICD-10-CM

## 2020-12-03 DIAGNOSIS — R296 Repeated falls: Secondary | ICD-10-CM | POA: Diagnosis not present

## 2020-12-03 DIAGNOSIS — G10 Huntington's disease: Secondary | ICD-10-CM | POA: Diagnosis not present

## 2020-12-03 DIAGNOSIS — Z9013 Acquired absence of bilateral breasts and nipples: Secondary | ICD-10-CM

## 2020-12-07 ENCOUNTER — Other Ambulatory Visit: Payer: Self-pay | Admitting: Family Medicine

## 2020-12-07 DIAGNOSIS — F325 Major depressive disorder, single episode, in full remission: Secondary | ICD-10-CM

## 2020-12-07 DIAGNOSIS — I700 Atherosclerosis of aorta: Secondary | ICD-10-CM

## 2020-12-07 DIAGNOSIS — E559 Vitamin D deficiency, unspecified: Secondary | ICD-10-CM

## 2020-12-08 NOTE — Telephone Encounter (Signed)
Pharmacy requests refill on: Escitalopram 20 mg  LAST REFILL: 02/27/2020 (Q-90, R-3) LAST OV: 11/24/2020 NEXT OV: Not Scheduled  PHARMACY: [facility], [place] requests refill on: Vitamin D2 1.25 mg   LAST REFILL: 09/17/2019  LAST OV: 11/24/2020 NEXT OV: Not Scheduled  PHARMACY: [facility] [place], [place]

## 2020-12-26 ENCOUNTER — Telehealth: Payer: Self-pay | Admitting: Family Medicine

## 2020-12-26 DIAGNOSIS — S42401A Unspecified fracture of lower end of right humerus, initial encounter for closed fracture: Secondary | ICD-10-CM

## 2020-12-26 NOTE — Telephone Encounter (Signed)
Pt called in and [facility] and she can not have them at both places, and wanted to know if she can get her PT switched to [facility]

## 2020-12-26 NOTE — Telephone Encounter (Signed)
Dr Einar Pheasant I need a referral put in for PT and then I can send it to [facility]. Not able to use the [facility] order for PT. Thank you

## 2020-12-26 NOTE — Telephone Encounter (Signed)
Routing to referral coordinator to update referral. Ok to switch to [facility] per patient request

## 2020-12-29 NOTE — Telephone Encounter (Signed)
Referral sent over to [facility] and patient advised on her voicemail.

## 2021-01-19 NOTE — Telephone Encounter (Signed)
Advised patient that I re sent PT referral to [facility] as requested below.

## 2021-01-19 NOTE — Telephone Encounter (Signed)
Barbara Krause called in and wanted to know about getting the referral for PT changed to [facility] due to she gets her OT done at that location, looked at the notes and saw that a referral was sent but Barbara Krause stated that they have not received the referral, and the fax number it can be sent to is [phone]

## 2021-02-11 ENCOUNTER — Ambulatory Visit: Payer: Medicare Other | Admitting: Urology

## 2021-02-23 ENCOUNTER — Other Ambulatory Visit: Payer: Self-pay | Admitting: *Deleted

## 2021-02-23 MED ORDER — LOSARTAN POTASSIUM 25 MG PO TABS
25.0000 mg | ORAL_TABLET | Freq: Every day | ORAL | 8 refills | Status: DC
Start: 2021-02-23 — End: 2022-01-13

## 2021-03-03 ENCOUNTER — Other Ambulatory Visit: Payer: Self-pay

## 2021-03-03 ENCOUNTER — Ambulatory Visit
Admission: RE | Admit: 2021-03-03 | Discharge: 2021-03-03 | Disposition: A | Discharge status: Home / Self Care | Payer: Medicare Other | Attending: Urology | Admitting: Urology

## 2021-03-03 ENCOUNTER — Ambulatory Visit
Admission: RE | Admit: 2021-03-03 | Discharge: 2021-03-03 | Disposition: A | Discharge status: Home / Self Care | Payer: Medicare Other | Source: Ambulatory Visit | Attending: Urology | Admitting: Urology

## 2021-03-03 DIAGNOSIS — N200 Calculus of kidney: Secondary | ICD-10-CM

## 2021-03-03 NOTE — Progress Notes (Signed)
03/04/2021  3:29 PM   Barbara Krause [birth year] [id number]  Referring provider: [name], MD [place],  [place] [address] Chief Complaint  Patient presents with  . Nephrolithiasis    HPI: Barbara Krause is a 80 y.o. female with a personal history of microscopic hematuria, who returns today for an annual follow up.  Current everyday smoker. About 3/4 ppd for last 50 years.  Patient has a familial history of ovarian cancer (sister), and a parental history of colon cancer.  She underwent a CT urogram in 12/2018 indicating a known rightright nonobstructing right lower pole stone measuring 2.2 cm consistent with KUB from 07/05/2019.[facility] Kitchen Cystoscopy from 07/2019 was negative.   She was also seen and evaluated for dysuria which was found to have no evidence of infection but significant atrophic vulva/vaginitis. She has been using vaginal moisturizers with significant relief for her discomfort.  Recent KUB showed same 17 mm right sided calcification but it's significantly lower.  Denies any flank pain today.  UA today showed microscopic blood in urine  Today she reports that she is doing well and is not experiencing any urinary symptoms.   PMH: Past Medical History:  Diagnosis Date  . 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 Depression   . History of chicken pox   . History of colon polyps   . 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[place])   . Kidney stone   . Osteoporosis     Surgical History: Past Surgical History:  Procedure Laterality Date  . [place]  . CHOLECYSTECTOMY    . TOTAL MASTECTOMY Bilateral 2009    Home Medications:  Allergies as of 03/04/2021      Reactions   Penicillin G Hives      Medication List       Accurate as of Mar 04, 2021  3:29 PM. If you have any questions, ask your nurse or doctor.        STOP taking these medications   atorvastatin 10 MG tablet Commonly known as: LIPITOR Stopped by: Hollice Espy, MD     TAKE these  medications   Deutetrabenazine 6 MG Tabs Take by mouth.   donepezil 5 MG tablet Commonly known as: ARICEPT Take 1 tablet by mouth daily.   escitalopram 20 MG tablet Commonly known as: LEXAPRO TAKE 1 TABLET BY MOUTH EVERY DAY   losartan 25 MG tablet Commonly known as: COZAAR Take 1 tablet (25 mg total) by mouth daily.   Vitamin D (Ergocalciferol) 1.25 MG (50000 UNIT) Caps capsule Commonly known as: DRISDOL TAKE 1 CAPSULE BY MOUTH EVERY 7 DAYS. DUE FOR FOLLOW UP IN NOVEMBER. PLEASE SCHEDULE       Allergies: Allergies  Allergen Reactions  . Penicillin G Hives    Family History: Family History  Problem Relation Age of Onset  . Colon cancer Mother   . Stroke Father   . Heart disease Father   . Ovarian cancer Sister   . Healthy Daughter   . Stomach cancer Maternal Grandmother   . Diabetes Maternal Grandfather   . Colon cancer Paternal Grandmother   . Diabetes Paternal Grandfather   . Healthy Daughter     Social History:   reports that she has been smoking cigarettes. She has a 37.50 pack-year smoking history. She has never used smokeless tobacco. She reports current alcohol use. She reports that she does not use drugs.  ROS: Pertinent ROS in HPI.  Physical Exam:  BP 116/84   Pulse 76   Constitutional:  Alert and oriented, No acute distress.  In wheelchair today.  Accompanied by caretaker. HEENT: [place] AT, moist mucus membranes.  Trachea midline, no masses. Cardiovascular: No clubbing, cyanosis, or edema. Respiratory: Normal respiratory effort, no increased work of breathing. Skin: No rashes, bruises or suspicious lesions. Neurologic: Grossly intact, no focal deficits, moving all 4 extremities. Psychiatric: Normal mood and affect.  Laboratory Data:  Lab Results  Component Value Date   CREATININE 0.86 06/15/2020    I have reviewed the labs.   Pertinent Imaging: Large 1.8 cm previous right kidney stone appears significantly lower today, question whether this  is position related or if there is been interval migration, final radiologic interpretation is pending.  These images were personally reviewed and interpreted by myself as outlined above   Assessment & Plan:    1. Micro hematuria Persistent today-high risk patient with extensive smoking history  Will repeat cysto, last one was in 2020  2. Kidney stone Overall size of the stone has remained the same, but it has moved, might be due to the patient's position. She said that she was laying down when the did it.  Question patient position versus migration of stone.  Will repeat CTU both for diagnostic purposes of evaluating the stone as well as persistence of microscopic hematuria and high risk patient   Follow Up:  CT within next few weeks, follow up with cysto.  I have reviewed the above documentation for accuracy and completeness, and I agree with the above.   Hollice Espy, MD   I, Ardyth Gal, am acting as a scribe for Dr. Hollice Espy.    [facility] [address] [place], [place] [address] [phone]

## 2021-03-04 ENCOUNTER — Ambulatory Visit (INDEPENDENT_AMBULATORY_CARE_PROVIDER_SITE_OTHER): Payer: Medicare Other | Admitting: Urology

## 2021-03-04 ENCOUNTER — Encounter: Payer: Self-pay | Admitting: Urology

## 2021-03-04 VITALS — BP 116/84 | HR 76

## 2021-03-04 DIAGNOSIS — N200 Calculus of kidney: Secondary | ICD-10-CM | POA: Diagnosis not present

## 2021-03-04 DIAGNOSIS — R3129 Other microscopic hematuria: Secondary | ICD-10-CM | POA: Diagnosis not present

## 2021-03-04 LAB — URINALYSIS, COMPLETE
Bilirubin, UA: NEGATIVE
Glucose, UA: NEGATIVE
Ketones, UA: NEGATIVE
Leukocytes,UA: NEGATIVE
Nitrite, UA: NEGATIVE
Specific Gravity, UA: 1.025 (ref 1.005–1.030)
Urobilinogen, Ur: 0.2 mg/dL (ref 0.2–1.0)
pH, UA: 5.5 (ref 5.0–7.5)

## 2021-03-04 LAB — MICROSCOPIC EXAMINATION: Bacteria, UA: NONE SEEN

## 2021-03-04 NOTE — Patient Instructions (Signed)
Cystoscopy Cystoscopy is a procedure that is used to help diagnose and sometimes treat conditions that affect the lower urinary tract. The lower urinary tract includes the bladder and the urethra. The urethra is the tube that drains urine from the bladder. Cystoscopy is done using a thin, tube-shaped instrument with a light and camera at the end (cystoscope). The cystoscope may be hard or flexible, depending on the goal of the procedure. The cystoscope is inserted through the urethra, into the bladder. Cystoscopy may be recommended if you have:  Urinary tract infections that keep coming back.  Blood in the urine (hematuria).  An inability to control when you urinate (urinary incontinence) or an overactive bladder.  Unusual cells found in a urine sample.  A blockage in the urethra, such as a urinary stone.  Painful urination.  An abnormality in the bladder found during an intravenous pyelogram (IVP) or CT scan. Cystoscopy may also be done to remove a sample of tissue to be examined under a microscope (biopsy). Tell a health care provider about:  Any allergies you have.  All medicines you are taking, including vitamins, herbs, eye drops, creams, and over-the-counter medicines.  Any problems you or family members have had with anesthetic medicines.  Any blood disorders you have.  Any surgeries you have had.  Any medical conditions you have.  Whether you are pregnant or may be pregnant. What are the risks? Generally, this is a safe procedure. However, problems may occur, including:  Infection.  Bleeding.  Allergic reactions to medicines.  Damage to other structures or organs. What happens before the procedure? Medicines Ask your health care provider about:  Changing or stopping your regular medicines. This is especially important if you are taking diabetes medicines or blood thinners.  Taking medicines such as aspirin and ibuprofen. These medicines can thin your blood. Do  not take these medicines unless your health care provider tells you to take them.  Taking over-the-counter medicines, vitamins, herbs, and supplements. Tests You may have an exam or testing, such as:  X-rays of the bladder, urethra, or kidneys.  CT scan of the abdomen or pelvis.  Urine tests to check for signs of infection. General instructions  Follow instructions from your health care provider about eating or drinking restrictions.  Ask your health care provider what steps will be taken to help prevent infection. These steps may include: ? Washing skin with a germ-killing soap. ? Taking antibiotic medicine.  Plan to have a responsible adult take you home from the hospital or clinic. What happens during the procedure?  You will be given one or more of the following: ? A medicine to help you relax (sedative). ? A medicine to numb the area (local anesthetic).  The area around the opening of your urethra will be cleaned.  The cystoscope will be passed through your urethra into your bladder.  Germ-free (sterile) fluid will flow through the cystoscope to fill your bladder. The fluid will stretch your bladder so that your health care provider can clearly examine your bladder walls.  Your doctor will look at the urethra and bladder. Your doctor may take a biopsy or remove stones.  The cystoscope will be removed, and your bladder will be emptied. The procedure may vary among health care providers and hospitals.   What can I expect after the procedure? After the procedure, it is common to have:  Some soreness or pain in your abdomen and urethra.  Urinary symptoms. These include: ? Mild pain or burning  when you urinate. Pain should stop within a few minutes after you urinate. This may last for up to 1 week. ? A small amount of blood in your urine for several days. ? Feeling like you need to urinate but producing only a small amount of urine. Follow these instructions at  home: Medicines  Take over-the-counter and prescription medicines only as told by your health care provider.  If you were prescribed an antibiotic medicine, take it as told by your health care provider. Do not stop taking the antibiotic even if you start to feel better. General instructions  Return to your normal activities as told by your health care provider. Ask your health care provider what activities are safe for you.  If you were given a sedative during the procedure, it can affect you for several hours. Do not drive or operate machinery until your health care provider says that it is safe.  Watch for any blood in your urine. If the amount of blood in your urine increases, call your health care provider.  Follow instructions from your health care provider about eating or drinking restrictions.  If a tissue sample was removed for testing (biopsy) during your procedure, it is up to you to get your test results. Ask your health care provider, or the department that is doing the test, when your results will be ready.  Drink enough fluid to keep your urine pale yellow.  Keep all follow-up visits. This is important. Contact a health care provider if:  You have pain that gets worse or does not get better with medicine, especially pain when you urinate.  You have trouble urinating.  You have more blood in your urine. Get help right away if:  You have blood clots in your urine.  You have abdominal pain.  You have a fever or chills.  You are unable to urinate. Summary  Cystoscopy is a procedure that is used to help diagnose and sometimes treat conditions that affect the lower urinary tract.  Cystoscopy is done using a thin, tube-shaped instrument with a light and camera at the end.  After the procedure, it is common to have some soreness or pain in your abdomen and urethra.  Watch for any blood in your urine. If the amount of blood in your urine increases, call your health  care provider.  If you were prescribed an antibiotic medicine, take it as told by your health care provider. Do not stop taking the antibiotic even if you start to feel better. This information is not intended to replace advice given to you by your health care provider. Make sure you discuss any questions you have with your health care provider. Document Revised: 05/30/2020 Document Reviewed: 05/30/2020 Elsevier Patient Education  [place].

## 2021-03-05 ENCOUNTER — Other Ambulatory Visit: Payer: Self-pay | Admitting: Family Medicine

## 2021-03-05 DIAGNOSIS — F325 Major depressive disorder, single episode, in full remission: Secondary | ICD-10-CM

## 2021-03-10 ENCOUNTER — Ambulatory Visit: Payer: Self-pay | Admitting: Urology

## 2021-03-23 ENCOUNTER — Ambulatory Visit
Admission: RE | Admit: 2021-03-23 | Discharge: 2021-03-23 | Disposition: A | Discharge status: Home / Self Care | Payer: Medicare Other | Source: Ambulatory Visit | Attending: Urology | Admitting: Urology

## 2021-03-23 ENCOUNTER — Other Ambulatory Visit: Payer: Self-pay

## 2021-03-23 DIAGNOSIS — N200 Calculus of kidney: Secondary | ICD-10-CM | POA: Insufficient documentation

## 2021-03-23 DIAGNOSIS — R3129 Other microscopic hematuria: Secondary | ICD-10-CM | POA: Diagnosis present

## 2021-03-23 LAB — POCT I-STAT CREATININE: Creatinine, Ser: 0.7 mg/dL (ref 0.44–1.00)

## 2021-03-23 MED ORDER — IOHEXOL 300 MG/ML  SOLN
100.0000 mL | Freq: Once | INTRAMUSCULAR | Status: AC | PRN
  Administered 2021-03-23: 100 mL via INTRAVENOUS

## 2021-04-01 ENCOUNTER — Ambulatory Visit (INDEPENDENT_AMBULATORY_CARE_PROVIDER_SITE_OTHER): Payer: Medicare Other | Admitting: Urology

## 2021-04-01 ENCOUNTER — Other Ambulatory Visit: Payer: Self-pay

## 2021-04-01 ENCOUNTER — Other Ambulatory Visit: Payer: Self-pay | Admitting: Urology

## 2021-04-01 ENCOUNTER — Encounter: Payer: Self-pay | Admitting: Urology

## 2021-04-01 VITALS — BP 126/84 | HR 71 | Ht 66.1 in

## 2021-04-01 DIAGNOSIS — N200 Calculus of kidney: Secondary | ICD-10-CM

## 2021-04-01 DIAGNOSIS — R3129 Other microscopic hematuria: Secondary | ICD-10-CM | POA: Diagnosis not present

## 2021-04-01 NOTE — Progress Notes (Signed)
   04/01/21  CC:  Chief Complaint  Patient presents with  . Cysto    HPI: 80 year old smoker with ongoing microscopic hematuria who returns to the office today for cystoscopy.  In the interim, she underwent CT urogram which was unremarkable other than for known right 1.8 cm lower pole stone which is chronic and unchanged as well as some few additional nonobstructing stones.  There is incomplete ureteral opacification and a portion of the proximal ureter but otherwise, the CT urogram was unremarkable without any additional GU pathology.  Blood pressure 126/84, pulse 71, height 5' 6.1" (1.679 m). NED. A&Ox3.   No respiratory distress   Abd soft, NT, ND Normal external genitalia with patent urethral meatus  Cystoscopy Procedure Note  Patient identification was confirmed, informed consent was obtained, and patient was prepped using Betadine solution.  Lidocaine jelly was administered per urethral meatus.    Procedure: - Flexible cystoscope introduced, without any difficulty.   - Thorough search of the bladder revealed:    normal urethral meatus    normal urothelium    no stones    no ulcers     no tumors    no urethral polyps    no trabeculation  - Ureteral orifices were normal in position and appearance.  Post-Procedure: - Patient tolerated the procedure well  Assessment/ Plan:  1. Microscopic hematuria Likely secondary to kidney stones  No concern for additional upper tract GU pathology as well as no evidence of bladder cancer  As always, continue to encourage smoking cessation based on his causative relationship with multiple malignancies including bladder cancer - Urinalysis, Complete  2. Kidney stone Asymptomatic right lower pole stone which is been stable  Recommend continued observation for this, see previous notes for details  We will have her follow-up in a year with a KUB and urinalysis or sooner if she develops any urinary symptoms - Abdomen 1 view  (KUB); Future  Hollice Espy, MD

## 2021-04-03 LAB — URINALYSIS, COMPLETE
Bilirubin, UA: NEGATIVE
Glucose, UA: NEGATIVE
Leukocytes,UA: NEGATIVE
Nitrite, UA: NEGATIVE
Specific Gravity, UA: 1.025 (ref 1.005–1.030)
Urobilinogen, Ur: 0.2 mg/dL (ref 0.2–1.0)
pH, UA: 5.5 (ref 5.0–7.5)

## 2021-04-03 LAB — MICROSCOPIC EXAMINATION: Bacteria, UA: NONE SEEN

## 2021-04-21 ENCOUNTER — Ambulatory Visit: Payer: Self-pay | Admitting: Urology

## 2021-07-01 ENCOUNTER — Telehealth: Payer: Self-pay | Admitting: Family Medicine

## 2021-07-01 NOTE — Telephone Encounter (Signed)
Pt called wanting to know if Dr. Einar Pheasant would take over fill her donepezil (ARICEPT) 5 MG tablet since she is not seeing Dr. Manuella Ghazi anymore

## 2021-07-02 MED ORDER — DONEPEZIL HCL 5 MG PO TABS: 5.0000 mg | ORAL_TABLET | Freq: Every day | ORAL | 1 refills | Status: DC

## 2021-07-02 NOTE — Telephone Encounter (Signed)
That would be fine. If symptoms worsen I may suggest she return to Dr. Manuella Ghazi.   I've sent in a refill in case she is due

## 2021-07-10 NOTE — Telephone Encounter (Signed)
Pt notified of Dr. Verda Cumins message and refill by VM on home answering machine (DPR).

## 2021-09-09 ENCOUNTER — Other Ambulatory Visit: Payer: Self-pay | Admitting: Family Medicine

## 2021-09-09 DIAGNOSIS — I700 Atherosclerosis of aorta: Secondary | ICD-10-CM

## 2021-09-22 ENCOUNTER — Other Ambulatory Visit: Payer: Self-pay | Admitting: Family Medicine

## 2021-09-22 DIAGNOSIS — R059 Cough, unspecified: Secondary | ICD-10-CM

## 2021-09-22 DIAGNOSIS — J439 Emphysema, unspecified: Secondary | ICD-10-CM

## 2021-10-19 ENCOUNTER — Other Ambulatory Visit: Payer: Self-pay | Admitting: Family Medicine

## 2021-10-19 DIAGNOSIS — I700 Atherosclerosis of aorta: Secondary | ICD-10-CM

## 2021-10-26 ENCOUNTER — Telehealth: Payer: Self-pay | Admitting: Family Medicine

## 2021-10-26 DIAGNOSIS — I700 Atherosclerosis of aorta: Secondary | ICD-10-CM

## 2021-10-27 ENCOUNTER — Other Ambulatory Visit: Payer: Self-pay

## 2021-10-27 ENCOUNTER — Telehealth: Payer: Self-pay | Admitting: Family Medicine

## 2021-10-27 MED ORDER — ATORVASTATIN CALCIUM 10 MG PO TABS: 10.0000 mg | ORAL_TABLET | Freq: Every day | ORAL | 0 refills | Status: DC

## 2021-10-27 NOTE — Telephone Encounter (Signed)
Rx filled for 90 days. Mychart sent to pt.

## 2021-10-27 NOTE — Telephone Encounter (Signed)
error 

## 2021-10-27 NOTE — Telephone Encounter (Signed)
Pt called asking why medication was refused. Please advise.

## 2021-11-18 NOTE — Progress Notes (Signed)
Subjective:   Barbara Krause is a 81 y.o. female who presents for Medicare Annual (Subsequent) preventive examination.  I connected with Barbara Krause today by telephone and verified that I am speaking with the correct person using two identifiers. Location patient: home Location provider: work Persons participating in the virtual visit: patient, Marine scientist.    I discussed the limitations, risks, security and privacy concerns of performing an evaluation and management service by telephone and the availability of in person appointments. I also discussed with the patient that there may be a patient responsible charge related to this service. The patient expressed understanding and verbally consented to this telephonic visit.    Interactive audio and video telecommunications were attempted between this provider and patient, however failed, due to patient having technical difficulties OR patient did not have access to video capability.  We continued and completed visit with audio only.  Some vital signs may be absent or patient reported.   Time Spent with patient on telephone encounter: 25 minutes  Review of Systems     Cardiac Risk Factors include: advanced age (>31mn, >>66women);hypertension     Objective:    Today's Vitals   11/19/21 1526  Weight: 109 lb (49.4 kg)  Height: 5' 6"  (1.676 m)   Body mass index is 17.59 kg/m.  Advanced Directives 11/19/2021 11/18/2020  Does Patient Have a Medical Advance Directive? Yes Yes  Type of AParamedicof [place] will  Does patient want to make changes to medical advance directive? Yes (MAU/Ambulatory/Procedural Areas - Information given) -  [place]? Yes - validated most recent copy scanned in chart (See row information) Yes - validated most recent copy scanned in chart (See row information)    Current Medications (verified) Outpatient Encounter  Medications as of 11/19/2021  Medication Sig   atorvastatin (LIPITOR) 10 MG tablet Take 1 tablet (10 mg total) by mouth daily.   AUSTEDO 12 MG TABS Take 1 tablet by mouth 2 (two) times daily.   donepezil (ARICEPT) 5 MG tablet Take 1 tablet (5 mg total) by mouth daily.   escitalopram (LEXAPRO) 20 MG tablet TAKE 1 TABLET BY MOUTH EVERY DAY   Vitamin D, Ergocalciferol, (DRISDOL) 1.25 MG (50000 UNIT) CAPS capsule TAKE 1 CAPSULE BY MOUTH EVERY 7 DAYS. DUE FOR FOLLOW UP IN NOVEMBER. PLEASE SCHEDULE   Deutetrabenazine 6 MG TABS Take by mouth.   losartan (COZAAR) 25 MG tablet Take 1 tablet (25 mg total) by mouth daily.   No facility-administered encounter medications on file as of 11/19/2021.    Allergies (verified) Penicillin g   History: Past Medical History:  Diagnosis Date   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   Depression    History of chicken pox    History of colon polyps    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[place]    Kidney stone    Osteoporosis    Past Surgical History:  Procedure Laterality Date   CESAREAN SECTION     1977 and 1979   CHOLECYSTECTOMY     TOTAL MASTECTOMY Bilateral 2009   Family History  Problem Relation Age of Onset   Colon cancer Mother    Stroke Father    Heart disease Father    Ovarian cancer Sister    Healthy Daughter    Stomach cancer Maternal Grandmother    Diabetes Maternal Grandfather    Colon cancer Paternal Grandmother    Diabetes Paternal Grandfather  Healthy Daughter    Social History   Socioeconomic History   Marital status: Widowed    Spouse name: Barbara Krause   Number of children: 2   Years of education: Master's Degree in SW   Highest education level: Not on file  Occupational History   Not on file  Tobacco Use   Smoking status: Every Day    Packs/day: 0.75    Years: 50.00    Pack years: 37.50    Types: Cigarettes   Smokeless tobacco: Never  Vaping Use   Vaping Use: Never used  Substance and Sexual Activity   Alcohol use: Yes     Comment: about 1 glass of wine x 2 a week   Drug use: Never   Sexual activity: Not Currently  Other Topics Concern   Not on file  Social History Narrative   Retired from Social work, 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 from [place] to [place] to be near family   Enjoys: smoking, going to the movies and out to eat, seeing theater   Exercise: not currently   Diet: eats breakfast and dinner, but not much during the day   Social Determinants of Radio broadcast assistant Strain: Low Risk    Difficulty of Paying Living Expenses: Not hard at all  Food Insecurity: Not on file  Transportation Needs: No Transportation Needs   Lack of Transportation (Medical): No   Lack of Transportation (Non-Medical): No  Physical Activity: Sufficiently Active   Days of Exercise per Week: 7 days   Minutes of Exercise per Session: 30 min  Stress: No Stress Concern Present   Feeling of Stress : Not at all  Social Connections: Socially Isolated   Frequency of Communication with Friends and Family: More than three times a week   Frequency of Social Gatherings with Friends and Family: More than three times a week   Attends Religious Services: Never   Marine scientist or Organizations: No   Attends Archivist Meetings: Never   Marital Status: Widowed    Tobacco Counseling Ready to quit: Not Answered Counseling given: Not Answered   Clinical Intake:  Pre-visit preparation completed: Yes  Pain : No/denies pain     BMI - recorded: 17.59 Nutritional Status: BMI <19  Underweight Nutritional Risks: None Diabetes: No  How often do you need to have someone help you when you read instructions, pamphlets, or other written materials from your doctor or pharmacy?: 1 - Never  Diabetic?No  Interpreter Needed?: No  Information entered by :: Orrin Brigham LPN   Activities of Daily Living In your present state of health, do you have any difficulty performing the following  activities: 11/19/2021  Hearing? N  Vision? N  Difficulty concentrating or making decisions? N  Walking or climbing stairs? N  Dressing or bathing? N  Doing errands, shopping? Y  Comment Aid assist  Preparing Food and eating ? Y  Comment Aid assist  Using the Toilet? N  In the past six months, have you accidently leaked urine? N  Do you have problems with loss of bowel control? N  Managing your Medications? Y  Comment Aid assist  Managing your Finances? Y  Comment daughter assist  Housekeeping or managing your Housekeeping? Y  Comment has house keeping service  Some recent data might be hidden    Patient Care Team: [name], MD as PCP - General (Family Medicine) Kate Sable, MD as PCP -  Cardiology (Cardiology) Tempie Hoist, MD as Referring Physician (Neurology)  Indicate any recent Medical Services you may have received from other than [facility] providers in the past year (date may be approximate).     Assessment:   This is a routine wellness examination for Pe Ell.  Hearing/Vision screen Hearing Screening - Comments:: No issues Vision Screening - Comments:: Last exam 3 years ago  Dietary issues and exercise activities discussed: Current Exercise Habits: Home exercise routine, Type of exercise: strength training/weights;stretching, Time (Minutes): 30, Frequency (Times/Week): 7, Weekly Exercise (Minutes/Week): 210, Intensity: Moderate   Goals Addressed             This Visit's Progress    Patient Stated       Would like to maintain current routine.       Depression Screen PHQ 2/9 Scores 11/19/2021 11/18/2020 06/30/2020  PHQ - 2 Score 0 0 0  PHQ- 9 Score - 0 0    Fall Risk Fall Risk  11/19/2021 11/24/2020 11/18/2020 06/30/2020  Falls in the past year? 1 1 1 1   Number falls in past yr: 0 1 1 1   Injury with Fall? 1 1 1 1   Comment broke right arm - had to go to the hospital with head injury -  Risk for fall due to : Other (Comment) - History of fall(s)  History of fall(s);Impaired balance/gait;Impaired mobility  Risk for fall due to: Comment fell in the bathroom - - -  Follow up Falls prevention discussed - Falls evaluation completed;Falls prevention discussed Falls evaluation completed    FALL RISK PREVENTION PERTAINING TO THE HOME:  Any stairs in or around the home? Yes  If so, are there any without handrails? No  Home free of loose throw rugs in walkways, pet beds, electrical cords, etc? Yes  Adequate lighting in your home to reduce risk of falls? Yes   ASSISTIVE DEVICES UTILIZED TO PREVENT FALLS:  Life alert? No  Use of a cane, walker or w/c? Yes , walker Grab bars in the bathroom? Yes  Shower chair or bench in shower? Yes  Elevated toilet seat or a handicapped toilet? Yes   TIMED UP AND GO:  Was the test performed? No .    Cognitive Function: Normal cognitive status assessed by this Nurse Health Advisor. No abnormalities found.   MMSE - Mini Mental State Exam 11/18/2020  Orientation to time 5  Orientation to Place 5  Registration 3  Attention/ Calculation 5  Recall 3  Language- repeat 1        Immunizations Immunization History  Administered Date(s) Administered   Fluad Quad(high Dose 65+) 08/16/2019, 09/05/2020, 09/06/2021   Influenza Inj Mdck Quad Pf 08/16/2019   Influenza, High Dose Seasonal PF 08/16/2019   MMR 12/24/2013   PFIZER(Purple Top)SARS-COV-2 Vaccination 12/13/2019, 01/03/2020   Pneumococcal Conjugate-13 09/05/2017   Pneumococcal Polysaccharide-23 09/10/2009   Tdap 06/02/2015, 06/15/2020   Zoster Recombinat (Shingrix) 12/04/2018, 06/26/2019   Zoster, Live 11/04/2011    TDAP status: Up to date  Flu Vaccine status: Up to date  Pneumococcal vaccine status: Up to date  Covid-19 vaccine status: Information provided on how to obtain vaccines.   Qualifies for Shingles Vaccine? Yes   Zostavax completed Yes   Shingrix Completed?: Yes  Screening Tests Health Maintenance  Topic Date Due    Hepatitis C Screening  Never done   COVID-19 Vaccine (3 - Pfizer risk series) 01/31/2020   TETANUS/TDAP  06/15/2030   Pneumonia Vaccine 22+ Years old  Completed  INFLUENZA VACCINE  Completed   DEXA SCAN  Completed   Zoster Vaccines- Shingrix  Completed   HPV VACCINES  Aged Out    Health Maintenance  Health Maintenance Due  Topic Date Due   Hepatitis C Screening  Never done   COVID-19 Vaccine (3 - Pfizer risk series) 01/31/2020    Colorectal cancer screening: No longer required.   Mammogram status: No longer required due to mastectomy.  Bone Density status: Ordered 11/19/21. Pt provided with contact info and advised to call to schedule appt.  Lung Cancer Screening: (Low Dose CT Chest recommended if Age 54-80 years, 30 pack-year currently smoking OR have quit w/in 15years.) does qualify. Lung CT last completed 08/27/20 Patient states scan was done 12/2020   Additional Screening:  Hepatitis C Screening: does not qualify;  Vision Screening: Recommended annual ophthalmology exams for early detection of glaucoma and other disorders of the eye. Is the patient up to date with their annual eye exam?  No  Who is the provider or what is the name of the office in which the patient attends annual eye exams? Provider information unavailable.   Dental Screening: Recommended annual dental exams for proper oral hygiene  Community Resource Referral / Chronic Care Management: CRR required this visit?  No   CCM required this visit?  No      Plan:     I have personally reviewed and noted the following in the patients chart:   Medical and social history Use of alcohol, tobacco or illicit drugs  Current medications and supplements including opioid prescriptions.  Functional ability and status Nutritional status Physical activity Advanced directives List of other physicians Hospitalizations, surgeries, and ER visits in previous 12 months Vitals Screenings to include cognitive,  depression, and falls Referrals and appointments  In addition, I have reviewed and discussed with patient certain preventive protocols, quality metrics, and best practice recommendations. A written personalized care plan for preventive services as well as general preventive health recommendations were provided to patient.   Due to this being a telephonic visit, the after visit summary with patients personalized plan was offered to patient via mail or my-chart.  Patient would like to access on my-chart.   Loma Messing, LPN  [id number]   Nurse Health Advisor  Nurse Notes: Patient requesting a prescription refill for Fosamax

## 2021-11-19 ENCOUNTER — Ambulatory Visit (INDEPENDENT_AMBULATORY_CARE_PROVIDER_SITE_OTHER): Payer: Medicare Other

## 2021-11-19 ENCOUNTER — Ambulatory Visit: Payer: Medicare Other

## 2021-11-19 VITALS — Ht 66.0 in | Wt 109.0 lb

## 2021-11-19 DIAGNOSIS — Z780 Asymptomatic menopausal state: Secondary | ICD-10-CM | POA: Diagnosis not present

## 2021-11-19 DIAGNOSIS — Z0000 Encounter for general adult medical examination without abnormal findings: Secondary | ICD-10-CM | POA: Diagnosis not present

## 2021-11-19 NOTE — Patient Instructions (Signed)
Ms. Blinder , Thank you for taking time to complete your Medicare Wellness Visit. I appreciate your ongoing commitment to your health goals. Please review the following plan we discussed and let me know if I can assist you in the future.   Screening recommendations/referrals: Colonoscopy: no longer required Mammogram: no longer required  Bone Density: due, last completed 11/06/19, ordered today, someone will call to schedule an appointment. Recommended yearly ophthalmology/optometry visit for glaucoma screening and checkup Recommended yearly dental visit for hygiene and checkup  Vaccinations: Influenza vaccine: up to date Pneumococcal vaccine: up to date Tdap vaccine: up to date, completed 06/15/20, due 06/15/30 Shingles vaccine: up to date    Covid-19:newest booster available at your local pharmacy  Advanced directives: copy in chart   Conditions/risks identified: see problem list   Next appointment: Follow up in one year for your annual wellness visit 11/24/22 @ 3:30pm, this will be a telephone visit   Preventive Care 79 Years and Older, Female Preventive care refers to lifestyle choices and visits with your health care provider that can promote health and wellness. What does preventive care include? A yearly physical exam. This is also called an annual well check. Dental exams once or twice a year. Routine eye exams. Ask your health care provider how often you should have your eyes checked. Personal lifestyle choices, including: Daily care of your teeth and gums. Regular physical activity. Eating a healthy diet. Avoiding tobacco and drug use. Limiting alcohol use. Practicing safe sex. Taking low-dose aspirin every day. Taking vitamin and mineral supplements as recommended by your health care provider. What happens during an annual well check? The services and screenings done by your health care provider during your annual well check will depend on your age, overall health,  lifestyle risk factors, and family history of disease. Counseling  Your health care provider may ask you questions about your: Alcohol use. Tobacco use. Drug use. Emotional well-being. Home and relationship well-being. Sexual activity. Eating habits. History of falls. Memory and ability to understand (cognition). Work and work Statistician. Reproductive health. Screening  You may have the following tests or measurements: Height, weight, and BMI. Blood pressure. Lipid and cholesterol levels. These may be checked every 5 years, or more frequently if you are over 46 years old. Skin check. Lung cancer screening. You may have this screening every year starting at age 47 if you have a 30-pack-year history of smoking and currently smoke or have quit within the past 15 years. Fecal occult blood test (FOBT) of the stool. You may have this test every year starting at age 49. Flexible sigmoidoscopy or colonoscopy. You may have a sigmoidoscopy every 5 years or a colonoscopy every 10 years starting at age 12. Hepatitis C blood test. Hepatitis B blood test. Sexually transmitted disease (STD) testing. Diabetes screening. This is done by checking your blood sugar (glucose) after you have not eaten for a while (fasting). You may have this done every 1-3 years. Bone density scan. This is done to screen for osteoporosis. You may have this done starting at age 36. Mammogram. This may be done every 1-2 years. Talk to your health care provider about how often you should have regular mammograms. Talk with your health care provider about your test results, treatment options, and if necessary, the need for more tests. Vaccines  Your health care provider may recommend certain vaccines, such as: Influenza vaccine. This is recommended every year. Tetanus, diphtheria, and acellular pertussis (Tdap, Td) vaccine. You may need a  Td booster every 10 years. Zoster vaccine. You may need this after age  96. Pneumococcal 13-valent conjugate (PCV13) vaccine. One dose is recommended after age 66. Pneumococcal polysaccharide (PPSV23) vaccine. One dose is recommended after age 65. Talk to your health care provider about which screenings and vaccines you need and how often you need them. This information is not intended to replace advice given to you by your health care provider. Make sure you discuss any questions you have with your health care provider. Document Released: 11/14/2015 Document Revised: 07/07/2016 Document Reviewed: 08/19/2015 Elsevier Interactive Patient Education  2017 [employer] Prevention in the Home Falls can cause injuries. They can happen to people of all ages. There are many things you can do to make your home safe and to help prevent falls. What can I do on the outside of my home? Regularly fix the edges of walkways and driveways and fix any cracks. Remove anything that might make you trip as you walk through a door, such as a raised step or threshold. Trim any bushes or trees on the path to your home. Use bright outdoor lighting. Clear any walking paths of anything that might make someone trip, such as rocks or tools. Regularly check to see if handrails are loose or broken. Make sure that both sides of any steps have handrails. Any raised decks and porches should have guardrails on the edges. Have any leaves, snow, or ice cleared regularly. Use sand or salt on walking paths during winter. Clean up any spills in your garage right away. This includes oil or grease spills. What can I do in the bathroom? Use night lights. Install grab bars by the toilet and in the tub and shower. Do not use towel bars as grab bars. Use non-skid mats or decals in the tub or shower. If you need to sit down in the shower, use a plastic, non-slip stool. Keep the floor dry. Clean up any water that spills on the floor as soon as it happens. Remove soap buildup in the tub or shower  regularly. Attach bath mats securely with double-sided non-slip rug tape. Do not have throw rugs and other things on the floor that can make you trip. What can I do in the bedroom? Use night lights. Make sure that you have a light by your bed that is easy to reach. Do not use any sheets or blankets that are too big for your bed. They should not hang down onto the floor. Have a firm chair that has side arms. You can use this for support while you get dressed. Do not have throw rugs and other things on the floor that can make you trip. What can I do in the kitchen? Clean up any spills right away. Avoid walking on wet floors. Keep items that you use a lot in easy-to-reach places. If you need to reach something above you, use a strong step stool that has a grab bar. Keep electrical cords out of the way. Do not use floor polish or wax that makes floors slippery. If you must use wax, use non-skid floor wax. Do not have throw rugs and other things on the floor that can make you trip. What can I do with my stairs? Do not leave any items on the stairs. Make sure that there are handrails on both sides of the stairs and use them. Fix handrails that are broken or loose. Make sure that handrails are as long as the stairways. Check any  carpeting to make sure that it is firmly attached to the stairs. Fix any carpet that is loose or worn. Avoid having throw rugs at the top or bottom of the stairs. If you do have throw rugs, attach them to the floor with carpet tape. Make sure that you have a light switch at the top of the stairs and the bottom of the stairs. If you do not have them, ask someone to add them for you. What else can I do to help prevent falls? Wear shoes that: Do not have high heels. Have rubber bottoms. Are comfortable and fit you well. Are closed at the toe. Do not wear sandals. If you use a stepladder: Make sure that it is fully opened. Do not climb a closed stepladder. Make sure that  both sides of the stepladder are locked into place. Ask someone to hold it for you, if possible. Clearly mark and make sure that you can see: Any grab bars or handrails. First and last steps. Where the edge of each step is. Use tools that help you move around (mobility aids) if they are needed. These include: Canes. Walkers. Scooters. Crutches. Turn on the lights when you go into a dark area. Replace any light bulbs as soon as they burn out. Set up your furniture so you have a clear path. Avoid moving your furniture around. If any of your floors are uneven, fix them. If there are any pets around you, be aware of where they are. Review your medicines with your doctor. Some medicines can make you feel dizzy. This can increase your chance of falling. Ask your doctor what other things that you can do to help prevent falls. This information is not intended to replace advice given to you by your health care provider. Make sure you discuss any questions you have with your health care provider. Document Released: 08/14/2009 Document Revised: 03/25/2016 Document Reviewed: 11/22/2014 Elsevier Interactive Patient Education  2017 [employer].

## 2021-12-21 ENCOUNTER — Other Ambulatory Visit: Payer: Self-pay

## 2021-12-21 ENCOUNTER — Telehealth: Payer: Self-pay | Admitting: Acute Care

## 2021-12-21 DIAGNOSIS — F17210 Nicotine dependence, cigarettes, uncomplicated: Secondary | ICD-10-CM

## 2021-12-21 DIAGNOSIS — Z87891 Personal history of nicotine dependence: Secondary | ICD-10-CM

## 2021-12-21 NOTE — Telephone Encounter (Signed)
Per patient's Medicare plan, annual LDCT denied due to age 81.  Unable to schedule under the lung cancer screening program.  PCP may, if recommended, order CT w/o contrast but patient is not approved through the [facility] LDCT.  Routed to PCP for update.

## 2022-01-13 ENCOUNTER — Other Ambulatory Visit: Payer: Self-pay | Admitting: Family Medicine

## 2022-01-13 ENCOUNTER — Other Ambulatory Visit: Payer: Self-pay

## 2022-01-13 MED ORDER — LOSARTAN POTASSIUM 25 MG PO TABS
25.0000 mg | ORAL_TABLET | Freq: Every day | ORAL | 8 refills | Status: DC
Start: 2022-01-13 — End: 2022-09-30

## 2022-01-18 ENCOUNTER — Ambulatory Visit (INDEPENDENT_AMBULATORY_CARE_PROVIDER_SITE_OTHER): Payer: Medicare Other | Admitting: Family Medicine

## 2022-01-18 ENCOUNTER — Other Ambulatory Visit: Payer: Self-pay

## 2022-01-18 ENCOUNTER — Encounter: Payer: Self-pay | Admitting: Family Medicine

## 2022-01-18 VITALS — BP 124/84 | HR 74 | Ht 66.38 in | Wt 129.8 lb

## 2022-01-18 DIAGNOSIS — M810 Age-related osteoporosis without current pathological fracture: Secondary | ICD-10-CM

## 2022-01-18 DIAGNOSIS — E782 Mixed hyperlipidemia: Secondary | ICD-10-CM

## 2022-01-18 DIAGNOSIS — G10 Huntington's disease: Secondary | ICD-10-CM

## 2022-01-18 DIAGNOSIS — Z720 Tobacco use: Secondary | ICD-10-CM

## 2022-01-18 DIAGNOSIS — I10 Essential (primary) hypertension: Secondary | ICD-10-CM

## 2022-01-18 DIAGNOSIS — I700 Atherosclerosis of aorta: Secondary | ICD-10-CM

## 2022-01-18 MED ORDER — ALENDRONATE SODIUM 70 MG PO TABS: 70.0000 mg | ORAL_TABLET | ORAL | 3 refills | Status: DC

## 2022-01-18 NOTE — Assessment & Plan Note (Signed)
Blood pressure at goal continue losartan 25 mg daily.  Labs today ?

## 2022-01-18 NOTE — Progress Notes (Signed)
? ?Subjective:  ? ?  ?Barbara Krause is a 81 y.o. female presenting for Annual Exam ?  ? ? ?HPI ? ?#osteoporosis ?- using a walker since her last fracture ?- was in rehab with her elbow after OT ?- getting around OK ?- ran out of fosamax due to endo leaving would like refilled ?- was tolerating ? 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 Nicki Reaper every 6 months for this  ?- balance is so, so ? ?#tobacco use ?- breathing is ok ?- cutting back to 1/2 pp from 0.75 mg  ? ?Review of Systems ? ? ?Social History  ? ?Tobacco Use  ?Smoking Status Every Day  ? Packs/day: 0.75  ? Years: 50.00  ? Pack years: 37.50  ? Types: Cigarettes  ?Smokeless Tobacco Never  ? ? ? ?   ?Objective:  ?  ?BP Readings from Last 3 Encounters:  ?01/18/22 124/84  ?04/01/21 126/84  ?03/04/21 116/84  ? ?Wt Readings from Last 3 Encounters:  ?01/18/22 129 lb 12.8 oz (58.9 kg)  ?11/19/21 109 lb (49.4 kg)  ?11/24/20 109 lb (49.4 kg)  ? ? ?BP 124/84 (BP Location: Left Arm, Patient Position: Sitting, Cuff Size: Normal)   Pulse 74   Ht 5' 6.38" (1.686 m)   Wt 129 lb 12.8 oz (58.9 kg)   SpO2 99%   BMI 20.71 kg/m?  ? ? ?Physical Exam ?Constitutional:   ?   General: She is not in acute distress. ?   Appearance: She is well-developed. She is not diaphoretic.  ?HENT:  ?   Right Ear: External ear normal.  ?   Left Ear: External ear normal.  ?Eyes:  ?   Conjunctiva/sclera: Conjunctivae normal.  ?Cardiovascular:  ?   Rate and Rhythm: Normal rate and regular rhythm.  ?   Heart sounds: Murmur heard.  ?Pulmonary:  ?   Effort: Pulmonary effort is normal. No respiratory distress.  ?   Breath sounds: Normal breath sounds. No wheezing or rales.  ?Musculoskeletal:  ?   Cervical back: Neck supple.  ?Skin: ?   General: Skin is warm and dry.  ?   Capillary Refill: Capillary refill takes less than 2 seconds.  ?Neurological:  ?   Mental Status: She is alert. Mental status is at baseline.  ?Psychiatric:     ?   Mood and Affect: Mood normal.     ?    Behavior: Behavior normal.  ? ? ? ? ? ?   ?Assessment & Plan:  ? ?Problem List Items Addressed This Visit   ? ?  ? Cardiovascular and Mediastinum  ? HTN (hypertension)  ?  Blood pressure at goal continue losartan 25 mg daily.  Labs today ?  ?  ? Relevant Orders  ? Comprehensive metabolic panel  ? CBC  ? Aortic atherosclerosis ([place])  ?  On atorvastatin '10mg'$ .  ?  ?  ?  ? Nervous and Auditory  ? 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[place])  ?  She follows with [facility] neurolog[facility], continue Austedo 12 mg bid and donepezil 5 mg daily.  She is ambulating with a walker and she requests to see physical therapy again due to balance issues referral placed ?  ?  ? Relevant Orders  ? Ambulatory referral to Physical Therapy  ?  ? Musculoskeletal and Integument  ? Osteoporosis - Primary  ?  She has been on Fosamax for approximately 2 years, did have a fracture last year.  She ran out of  Fosamax 3 months ago refill provided today.  She will call to schedule DEXA scan. ?  ?  ? Relevant Medications  ? alendronate (FOSAMAX) 70 MG tablet  ? Other Relevant Orders  ? Vitamin D, 25-hydroxy  ?  ? Other  ? Tobacco use  ?  Patient is interested in continuing screening for lung cancer.  Current guidelines advise up to age 51 she is 48 however Medicare will stop paying for this.  She signed the paperwork saying she is agreeable to pay for the CT scan.  She is working on reducing smoking is cut back from three quarters of a pack to half a pack daily. ?  ?  ? Relevant Orders  ? CT CHEST LUNG CA SCREEN LOW DOSE W/O CM  ? ?Other Visit Diagnoses   ? ? Mixed hyperlipidemia      ? Relevant Orders  ? Lipid panel  ? ?  ? ? ? ?No follow-ups on file. ? ?[name], MD ? ?This visit occurred during the SARS-CoV-2 public health emergency.  Safety protocols were in place, including screening questions prior to the visit, additional usage of staff PPE, and extensive cleaning of exam room while observing appropriate contact time as indicated for disinfecting solutions.  ? ?

## 2022-01-18 NOTE — Assessment & Plan Note (Signed)
She has been on Fosamax for approximately 2 years, did have a fracture last year.  She ran out of Fosamax 3 months ago refill provided today.  She will call to schedule DEXA scan. ?

## 2022-01-18 NOTE — Assessment & Plan Note (Signed)
-

## 2022-01-18 NOTE — Assessment & Plan Note (Signed)
Patient is interested in continuing screening for lung cancer.  Current guidelines advise up to age 79 she is 35 however Medicare will stop paying for this.  She signed the paperwork saying she is agreeable to pay for the CT scan.  She is working on reducing smoking is cut back from three quarters of a pack to half a pack daily. ?

## 2022-01-18 NOTE — Patient Instructions (Addendum)
Please call the location of your choice from the menu below to schedule your Mammogram and/or Bone Density appointment.   ? ?[place] ? ?[place] [facility]   ?Phone:  [phone]   ?[place],  [address]                                            ?Services: 3D Mammogram and Bone Density ? ? ?Referral to PT ? ?CT scan ordered ? ?Labs today ? ? ?

## 2022-01-18 NOTE — Assessment & Plan Note (Addendum)
She follows with [facility] neurolog[facility], continue Austedo 12 mg bid and donepezil 5 mg daily.  She is ambulating with a walker and she requests to see physical therapy again due to balance issues referral placed ?

## 2022-01-19 ENCOUNTER — Telehealth: Payer: Self-pay | Admitting: Radiology

## 2022-01-19 DIAGNOSIS — M810 Age-related osteoporosis without current pathological fracture: Secondary | ICD-10-CM

## 2022-01-19 LAB — COMPREHENSIVE METABOLIC PANEL
ALT: 13 U/L (ref 0–35)
AST: 18 U/L (ref 0–37)
Albumin: 4.2 g/dL (ref 3.5–5.2)
Alkaline Phosphatase: 88 U/L (ref 39–117)
BUN: 27 mg/dL — ABNORMAL HIGH (ref 6–23)
CO2: 32 mEq/L (ref 19–32)
Calcium: 10 mg/dL (ref 8.4–10.5)
Chloride: 104 mEq/L (ref 96–112)
Creatinine, Ser: 0.84 mg/dL (ref 0.40–1.20)
GFR: 66.62 mL/min (ref >60.00)
Glucose, Bld: 78 mg/dL (ref 70–99)
Potassium: 5 mEq/L (ref 3.5–5.1)
Sodium: 142 mEq/L (ref 135–145)
Total Bilirubin: 0.4 mg/dL (ref 0.2–1.2)
Total Protein: 6.4 g/dL (ref 6.0–8.3)

## 2022-01-19 LAB — LIPID PANEL
Cholesterol: 144 mg/dL (ref 0–200)
HDL: 79.2 mg/dL (ref >39.00)
LDL Cholesterol: 40 mg/dL (ref 0–99)
NonHDL: 64.72
Total CHOL/HDL Ratio: 2
Triglycerides: 124 mg/dL (ref 0.0–149.0)
VLDL: 24.8 mg/dL (ref 0.0–40.0)

## 2022-01-19 LAB — CBC
HCT: 39.6 % (ref 36.0–46.0)
Hemoglobin: 13.5 g/dL (ref 12.0–15.0)
MCHC: 34.1 g/dL (ref 30.0–36.0)
MCV: 91.8 fl (ref 78.0–100.0)
Platelets: 196 10*3/uL (ref 150.0–400.0)
RBC: 4.32 Mil/uL (ref 3.87–5.11)
RDW: 12.7 % (ref 11.5–15.5)
WBC: 8.9 10*3/uL (ref 4.0–10.5)

## 2022-01-19 LAB — VITAMIN D 25 HYDROXY (VIT D DEFICIENCY, FRACTURES): VITD: 106.97 ng/mL (ref 30.00–100.00)

## 2022-01-19 NOTE — Telephone Encounter (Signed)
Spoke with patient. She has been taking Vitamin D RX once a week. Patient verbalized understanding to stop taking this and scheduled for follow up lab on 03/03/22 ?

## 2022-01-19 NOTE — Telephone Encounter (Signed)
Please call patient to see if she is taking any vitamin D Supplements.  ? ?If she is, advise her to stop her supplement and repeat testing in 6 weeks.  ? ? ?

## 2022-01-19 NOTE — Telephone Encounter (Signed)
[facility] lab called, Critical result, Vitamin D 107. Results given to Dr Einar Pheasant. ?

## 2022-01-19 NOTE — Telephone Encounter (Signed)
Opened in error

## 2022-01-20 NOTE — Telephone Encounter (Signed)
noted 

## 2022-01-27 ENCOUNTER — Encounter: Payer: Self-pay | Admitting: *Deleted

## 2022-01-29 ENCOUNTER — Other Ambulatory Visit: Payer: Self-pay | Admitting: Family Medicine

## 2022-02-01 ENCOUNTER — Other Ambulatory Visit: Payer: Self-pay | Admitting: Family Medicine

## 2022-02-01 DIAGNOSIS — J439 Emphysema, unspecified: Secondary | ICD-10-CM

## 2022-02-01 DIAGNOSIS — Z720 Tobacco use: Secondary | ICD-10-CM

## 2022-02-10 ENCOUNTER — Encounter: Payer: Self-pay | Admitting: *Deleted

## 2022-02-16 ENCOUNTER — Other Ambulatory Visit: Payer: Self-pay | Admitting: *Deleted

## 2022-02-16 DIAGNOSIS — Z122 Encounter for screening for malignant neoplasm of respiratory organs: Secondary | ICD-10-CM

## 2022-02-16 DIAGNOSIS — F17210 Nicotine dependence, cigarettes, uncomplicated: Secondary | ICD-10-CM

## 2022-02-16 DIAGNOSIS — Z87891 Personal history of nicotine dependence: Secondary | ICD-10-CM

## 2022-02-17 ENCOUNTER — Ambulatory Visit: Payer: Medicare Other

## 2022-02-18 ENCOUNTER — Ambulatory Visit
Admission: RE | Admit: 2022-02-18 | Discharge: 2022-02-18 | Disposition: A | Discharge status: Home / Self Care | Payer: Medicare Other | Source: Ambulatory Visit | Attending: Family Medicine | Admitting: Family Medicine

## 2022-02-18 ENCOUNTER — Ambulatory Visit: Payer: Medicare Other

## 2022-02-18 DIAGNOSIS — J439 Emphysema, unspecified: Secondary | ICD-10-CM | POA: Diagnosis present

## 2022-02-18 DIAGNOSIS — Z720 Tobacco use: Secondary | ICD-10-CM | POA: Insufficient documentation

## 2022-03-03 ENCOUNTER — Other Ambulatory Visit (INDEPENDENT_AMBULATORY_CARE_PROVIDER_SITE_OTHER): Payer: Medicare Other

## 2022-03-03 DIAGNOSIS — M810 Age-related osteoporosis without current pathological fracture: Secondary | ICD-10-CM

## 2022-03-03 LAB — VITAMIN D 25 HYDROXY (VIT D DEFICIENCY, FRACTURES): VITD: 72.7 ng/mL (ref 30.00–100.00)

## 2022-03-08 ENCOUNTER — Other Ambulatory Visit: Payer: Self-pay | Admitting: Family Medicine

## 2022-03-08 DIAGNOSIS — F325 Major depressive disorder, single episode, in full remission: Secondary | ICD-10-CM

## 2022-03-15 ENCOUNTER — Ambulatory Visit
Admission: RE | Admit: 2022-03-15 | Discharge: 2022-03-15 | Disposition: A | Discharge status: Home / Self Care | Payer: Medicare Other | Source: Ambulatory Visit | Attending: Family Medicine | Admitting: Family Medicine

## 2022-03-15 DIAGNOSIS — Z780 Asymptomatic menopausal state: Secondary | ICD-10-CM | POA: Insufficient documentation

## 2022-03-22 ENCOUNTER — Ambulatory Visit (INDEPENDENT_AMBULATORY_CARE_PROVIDER_SITE_OTHER): Payer: Medicare Other | Admitting: Family Medicine

## 2022-03-22 ENCOUNTER — Ambulatory Visit (INDEPENDENT_AMBULATORY_CARE_PROVIDER_SITE_OTHER)
Admission: RE | Admit: 2022-03-22 | Discharge: 2022-03-22 | Disposition: A | Discharge status: Home / Self Care | Payer: Medicare Other | Source: Ambulatory Visit | Attending: Family Medicine | Admitting: Family Medicine

## 2022-03-22 VITALS — BP 98/62 | HR 72 | Temp 98.1°F | Ht 66.4 in | Wt 130.0 lb

## 2022-03-22 DIAGNOSIS — M545 Low back pain, unspecified: Secondary | ICD-10-CM

## 2022-03-22 DIAGNOSIS — G10 Huntington's disease: Secondary | ICD-10-CM | POA: Diagnosis not present

## 2022-03-22 MED ORDER — MELOXICAM 7.5 MG PO TABS: 7.5000 mg | ORAL_TABLET | Freq: Every day | ORAL | 0 refills | Status: DC

## 2022-03-22 NOTE — Patient Instructions (Signed)
Back pain - take meloxicam 7.5 mg daily - Continue to for up 2 weeks, then just as needed - Work with physical therapy  OK to take tylenol if more pain Do not take Ibuprofen or Aleve  X-ray today

## 2022-03-22 NOTE — Progress Notes (Signed)
Subjective:     Barbara Krause is a 81 y.o. female presenting for Fall (Yesterday, backwards from a standing position but denies hitting head. ) and Back Pain (Lower since fall)     HPI  #Fall - while visiting daughter - was packing things up to come home - lost her balance - did not hit her head - no lightheadedness, palpations  Fell backward and landed on her back  #Low back pain - lower back - landed on there back - both sides - no leg symptoms - treatment: ibuprofen and doing ice - had to cancel her PT today  Review of Systems   Social History   Tobacco Use  Smoking Status Every Day   Packs/day: 0.75   Years: 50.00   Pack years: 37.50   Types: Cigarettes  Smokeless Tobacco Never        Objective:    BP Readings from Last 3 Encounters:  03/22/22 98/62  01/18/22 124/84  04/01/21 126/84   Wt Readings from Last 3 Encounters:  03/22/22 130 lb (59 kg)  01/18/22 129 lb 12.8 oz (58.9 kg)  11/19/21 109 lb (49.4 kg)    BP 98/62   Pulse 72   Temp 98.1 F (36.7 C) (Temporal)   Ht 5' 6.4" (1.687 m)   Wt 130 lb (59 kg)   SpO2 99%   BMI 20.73 kg/m    Physical Exam Constitutional:      General: She is not in acute distress.    Appearance: She is well-developed. She is not diaphoretic.  HENT:     Right Ear: External ear normal.     Left Ear: External ear normal.     Nose: Nose normal.  Eyes:     Conjunctiva/sclera: Conjunctivae normal.  Cardiovascular:     Rate and Rhythm: Normal rate and regular rhythm.     Heart sounds: No murmur heard. Pulmonary:     Effort: Pulmonary effort is normal.     Breath sounds: Normal breath sounds.  Musculoskeletal:     Cervical back: Neck supple.     Comments: Back Inspection: curved Palpation: slight lumbar spinous process ttp, no Paraspinous ttp Strength: Normal bilateral hip flexion/extension, knee flexion/extension, dorsiflexion/extension     Skin:    General: Skin is warm and dry.     Capillary  Refill: Capillary refill takes less than 2 seconds.  Neurological:     Mental Status: She is alert. Mental status is at baseline.     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        Behavior: Behavior normal.     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 Working with PT on balance and strength training.          Other   Acute bilateral low back pain without sciatica - Primary    Patient with history of osteoporosis, some wedging on x-ray unclear if this is a fracture or some other lesion on the bone.  Requesting stat read.  Meloxicam prescribed for pain.  She is already seen physical therapy discussed importance of moving around.  We will follow-up final read       Relevant Medications   meloxicam (MOBIC) 7.5 MG tablet  Other Relevant Orders   DG Lumbar Spine Complete     Return if symptoms worsen or fail to improve.  [name], MD

## 2022-03-22 NOTE — Assessment & Plan Note (Signed)
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 Working with PT on balance and strength training.

## 2022-03-22 NOTE — Assessment & Plan Note (Signed)
Patient with history of osteoporosis, some wedging on x-ray unclear if this is a fracture or some other lesion on the bone.  Requesting stat read.  Meloxicam prescribed for pain.  She is already seen physical therapy discussed importance of moving around.  We will follow-up final read

## 2022-04-20 ENCOUNTER — Encounter: Payer: Self-pay | Admitting: *Deleted

## 2022-04-20 ENCOUNTER — Other Ambulatory Visit: Payer: Self-pay

## 2022-04-20 DIAGNOSIS — N200 Calculus of kidney: Secondary | ICD-10-CM

## 2022-04-21 ENCOUNTER — Ambulatory Visit (INDEPENDENT_AMBULATORY_CARE_PROVIDER_SITE_OTHER): Payer: Medicare Other | Admitting: Urology

## 2022-04-21 ENCOUNTER — Ambulatory Visit
Admission: RE | Admit: 2022-04-21 | Discharge: 2022-04-21 | Disposition: A | Discharge status: Home / Self Care | Payer: Medicare Other | Source: Ambulatory Visit | Attending: Urology | Admitting: Urology

## 2022-04-21 ENCOUNTER — Ambulatory Visit
Admission: RE | Admit: 2022-04-21 | Discharge: 2022-04-21 | Disposition: A | Discharge status: Home / Self Care | Payer: Medicare Other | Attending: Urology | Admitting: Urology

## 2022-04-21 VITALS — BP 117/81 | HR 78 | Ht 66.0 in | Wt 130.0 lb

## 2022-04-21 DIAGNOSIS — N200 Calculus of kidney: Secondary | ICD-10-CM | POA: Diagnosis present

## 2022-04-21 DIAGNOSIS — R3129 Other microscopic hematuria: Secondary | ICD-10-CM

## 2022-04-21 DIAGNOSIS — Z87442 Personal history of urinary calculi: Secondary | ICD-10-CM

## 2022-04-21 NOTE — Progress Notes (Signed)
04/21/22 2:58 PM   Barbara Krause [birth year] [id number]  Referring provider:  [name], MD [place],  [place] [address] Chief Complaint  Patient presents with   Nephrolithiasis    1 year w/KUB     HPI: Barbara Krause is a 81 y.o.female with a personal history of microscopic hematuria, who returns today for an annual follow up.   She underwent a CT urogram in 12/2018 indicating a known right right nonobstructing right lower pole stone measuring 2.2 cm consistent with KUB from 07/05/2019.Marland Kitchen Cystoscopy from 07/2019 was negative.    KUB was personally reviewed and interpreted it visualized unchanged position of 17 mm right sided stone.   She is doing well today.  She denies any flank pain or gross hematuria.  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 If you have any questions, ask your nurse or doctor.          alendronate 70 MG tablet Commonly known as: FOSAMAX Take 1 tablet (70 mg total) by mouth every 7 (seven) days. Take with a full glass of water on an empty stomach.   atorvastatin 10 MG tablet Commonly known as: LIPITOR TAKE 1 TABLET BY MOUTH EVERY DAY   Austedo 12 MG Tabs Generic drug: Deutetrabenazine Take 1 tablet by mouth 2 (two) times daily.   donepezil 5 MG tablet Commonly known as: ARICEPT TAKE 1 TABLET (5 MG TOTAL) BY MOUTH DAILY.   escitalopram 20 MG tablet Commonly known as: LEXAPRO TAKE 1 TABLET BY MOUTH EVERY DAY   losartan 25 MG  tablet Commonly known as: COZAAR Take 1 tablet (25 mg total) by mouth daily.   meloxicam 7.5 MG tablet Commonly known as: MOBIC Take 1 tablet (7.5 mg total) by mouth daily.   Vitamin D (Ergocalciferol) 1.25 MG (50000 UNIT) Caps capsule Commonly known as: DRISDOL TAKE 1 CAPSULE BY MOUTH EVERY 7 DAYS. DUE FOR FOLLOW UP IN NOVEMBER. PLEASE SCHEDULE        Allergies:  Allergies  Allergen Reactions   Penicillin G Hives    Family History: Family History  Problem Relation Age of Onset   Colon cancer Mother    Stroke Father    Heart disease Father    Ovarian cancer Sister    Healthy Daughter    Stomach cancer Maternal Grandmother    Diabetes Maternal Grandfather    Colon cancer Paternal Grandmother    Diabetes Paternal Grandfather    Healthy Daughter     Social History:  reports that she has been smoking cigarettes. She has a 37.50 pack-year smoking history. She has never used smokeless tobacco. She reports current alcohol use. She reports that she does not use drugs.   Physical Exam: BP 117/81   Pulse 78   Ht '5\' 6"'$  (  1.676 m)   Wt 130 lb (59 kg)   BMI 20.98 kg/m   Constitutional:  Alert and oriented, No acute distress.  In wheelchair today, increased chorea noted.   HEENT: [place] AT, moist mucus membranes.  Trachea midline, no masses. Cardiovascular: No clubbing, cyanosis, or edema. Respiratory: Normal respiratory effort, no increased work of breathing. Skin: No rashes, bruises or suspicious lesions. Psychiatric: Normal mood and affect.  Laboratory Data:  Lab Results  Component Value Date   CREATININE 0.84 01/18/2022   Pertinent Imaging: KUB was personally reviewed see HPI.    Assessment & Plan:   Kidney stone  - Stable since initial presention recommend following up PRN basis  - Remains asymptomatic will refer back if she develops gross hematuria or flank pain   Return if symptoms worsen or fail to improve.  Conley Rolls as a Education administrator for Hollice Espy, MD.,have documented all relevant documentation on the behalf of Hollice Espy, MD,as directed by  Hollice Espy, MD while in the presence of Hollice Espy, MD.  I have reviewed the above documentation for accuracy and completeness, and I agree with the above.   Hollice Espy, MD    [facility] [address] [place], [place] [address] [phone]

## 2022-05-06 IMAGING — DX DG CHEST 2V
2 series · 2 of 2 positions shown · non-contrast
Comparison: Chest CT 08/23/2019

CLINICAL DATA: Chest congestion.

EXAM:
CHEST - 2 VIEW

[chest pa]
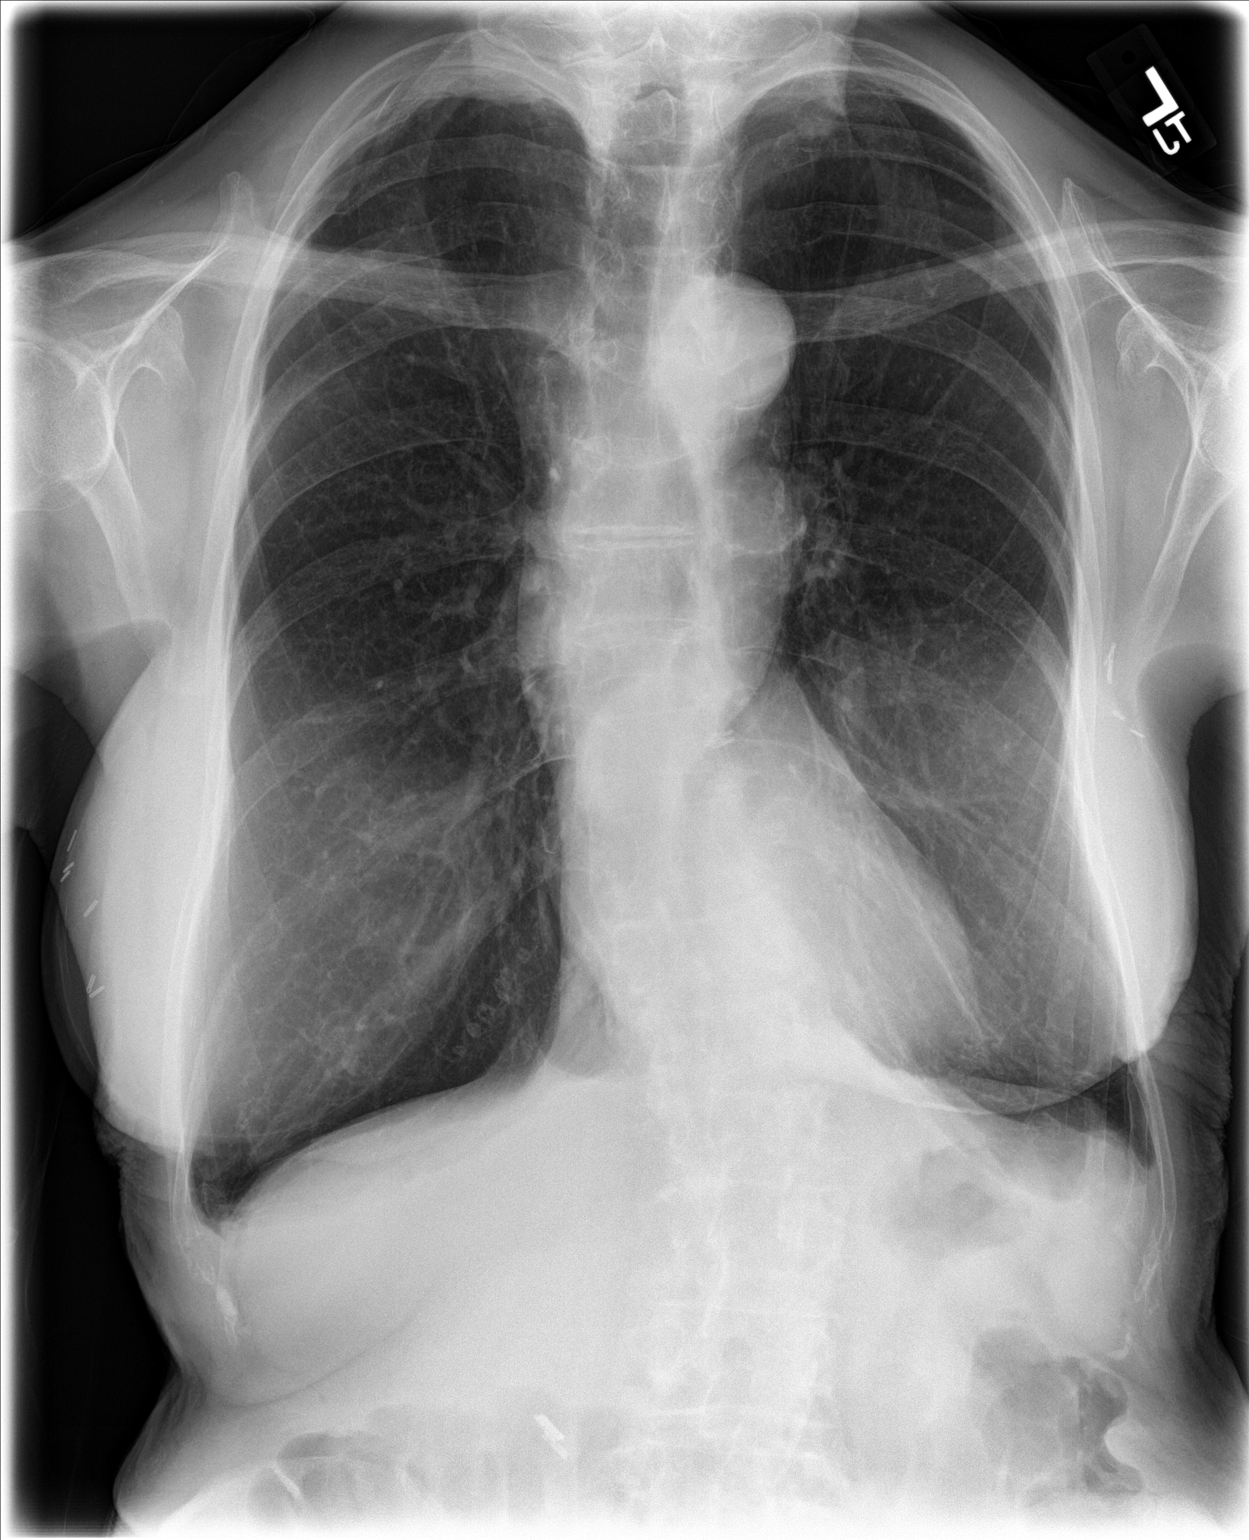

[chest lat]
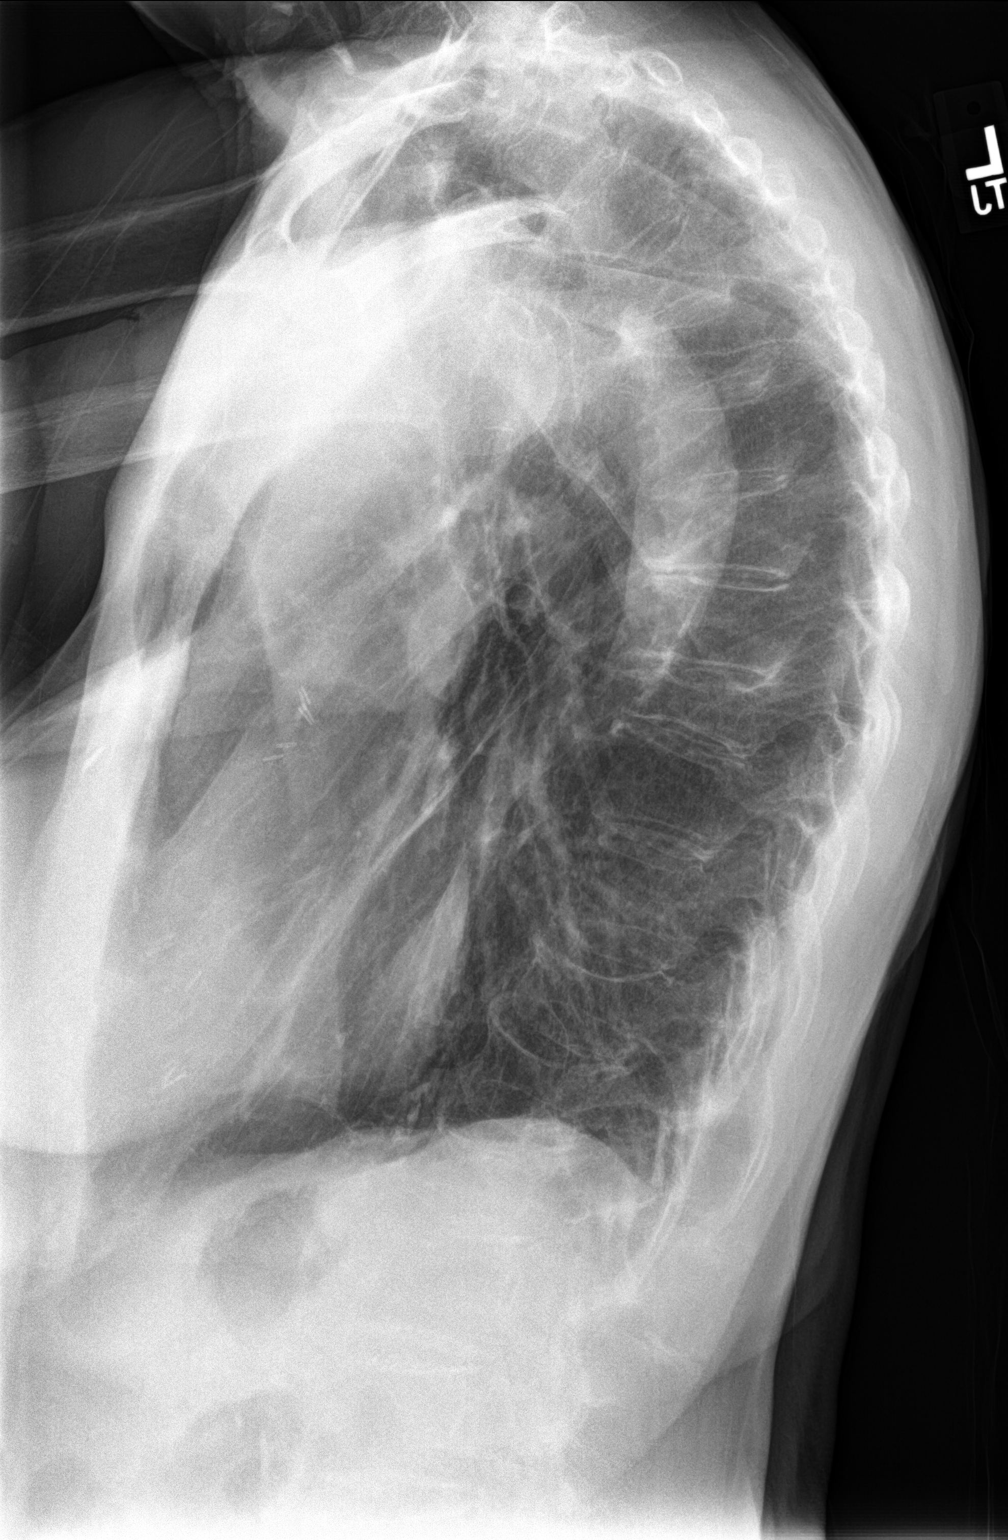

[2 of 2 positions shown; findings below may reference images not displayed]

FINDINGS: The cardiac silhouette is normal in size. There is thoracic aortic
tortuosity and atherosclerotic calcification. The lungs are
hyperinflated with underlying emphysema and biapical scarring. No
airspace consolidation, edema, pleural effusion, pneumothorax is
identified. Bilateral breast implants and adjacent surgical clips
are noted. There is moderate thoracolumbar levoscoliosis. There is a
chronic moderate T9 compression fracture which is unchanged.
IMPRESSION: Emphysema without evidence of active cardiopulmonary disease.

## 2022-05-11 ENCOUNTER — Encounter: Payer: Self-pay | Admitting: Family Medicine

## 2022-05-11 DIAGNOSIS — G10 Huntington's disease: Secondary | ICD-10-CM

## 2022-05-19 NOTE — Therapy (Signed)
OUTPATIENT PHYSICAL THERAPY NEURO EVALUATION   Patient Name: Barbara Krause MRN: [id number] DOB:January 14, 1941, 81 y.o., female Today's Date: 05/20/2022   PCP: [name], MD REFERRING PROVIDER: Pleas Koch, NP    PT End of Session - 05/20/22 1711     Visit Number 1    Number of Visits 10    Date for PT Re-Evaluation 07/01/22    Authorization Type Medicare/BCBS    PT Start Time 1530    PT Stop Time 1620    PT Time Calculation (min) 50 min    Equipment Utilized During Treatment Gait belt    Activity Tolerance Patient tolerated treatment well    Behavior During Therapy [facility] for tasks assessed/performed;Impulsive             Past Medical History:  Diagnosis Date   Breast cancer ([place]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   Depression    History of chicken pox    History of colon polyps    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[place])    Kidney stone    Osteoporosis    Past Surgical History:  Procedure Laterality Date   CESAREAN SECTION     1977 and [place] Bilateral 2009   Patient Active Problem List   Diagnosis Date Noted   Acute bilateral low back pain without sciatica 03/22/2022   Recurrent falls 11/26/2020   Closed fracture dislocation of right elbow 11/24/2020   Moderate protein-calorie malnutrition ([place]) 06/30/2020   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   SAH (subarachnoid hemorrhage) ([place]) 06/15/2020   Aortic atherosclerosis ([place]) 08/30/2019   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[place]) 08/30/2019   Microscopic hematuria 04/02/2019   Chronic fatigue syndrome 04/02/2019   Osteoporosis 04/02/2019   Vitamin B12 deficiency 04/02/2019   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   HTN (hypertension) 03/05/2019   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   Tobacco use 03/05/2019   Depression, major, single episode, complete remission ([place]) 03/05/2019   Hx of breast cancer 03/05/2019   S/P bilateral mastectomy 03/05/2019    ONSET DATE: past several years  REFERRING  DIAG: G10 (ICD-10-CM) - 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 Daughter is an OT who wanted her mother to go to more of a specialized neuro facility. Patient would like to work on the strength in her legs. Feels like her involuntary movements are getting worse but does take meds for this. Has a PCA that comes by the house daily for 4 hours and does exercises with her. Using RW at all times.  Pt accompanied by:  daughterGwenette Greet  PERTINENT HISTORY: breast CA with B mastectomy 2009, depression, Huntington disease, osteoporosis  PAIN:  Are you having pain? No  PRECAUTIONS: Fall and Other: osteoporosis and compression deformities in T-spine  WEIGHT BEARING RESTRICTIONS No  FALLS: Has patient fallen in last 6 months? Yes. Number of falls 1  LIVING ENVIRONMENT: Lives with: lives alone; has an aid 4hrs/day Lives in: House/apartment Stairs:  4 steps from garage with 2 handrails; lives on 1st floor of her home Has following equipment at home: Wheelchair (manual), Shower bench, and Grab bars  PLOF: Leisure: aid helps with cooking, shopping, meds; independent with household ambulation  PATIENT GOALS improve strength  OBJECTIVE:   DIAGNOSTIC FINDINGS:  03/22/22 lumbar xray: No acute findings. Multiple compression deformities are identified the within the lumbar spine and lower thoracic spine which appear unchanged when compared with CT of the chest from  02/18/2022.   COGNITION: Overall cognitive status: Impaired   SENSATION: WFL  COORDINATION: Alt pronation/supination: slightly dysmetric on L Finger to nose: intact B Heel to shin: intact B but limited trunk control    MUSCLE TONE: intact B   POSTURE: rounded shoulders, forward head, and increased thoracic kyphosis  LOWER EXTREMITY ROM:     Active  Right Eval Left Eval  Hip flexion    Hip extension    Hip abduction    Hip adduction    Hip internal rotation    Hip external rotation    Knee flexion    Knee extension    Ankle dorsiflexion 11 15  Ankle plantarflexion    Ankle inversion    Ankle eversion     (Blank rows = not tested)  LOWER EXTREMITY MMT:    MMT (in sitting) Right Eval Left Eval  Hip flexion 4 crepitus 4  Hip extension    Hip abduction 4 4  Hip adduction 4 4-  Hip internal rotation    Hip external rotation    Knee flexion 4+ 4+  Knee extension 4+ crepitus 4+ crepitus  Ankle dorsiflexion 4+ 4+  Ankle plantarflexion 4+ 4+  Ankle inversion    Ankle eversion    (Blank rows = not tested)  GAIT: Gait pattern:  R foot vaulting with forward flexed trunk and slow gait speed  Assistive device utilized: Walker - 2 wheeled Level of assistance: SBA   FUNCTIONAL TESTs:   [facility] PT Assessment - 05/20/22 0001       Standardized Balance Assessment   Standardized Balance Assessment Five Times Sit to Stand;Timed Up and Go Test;Berg Balance Test    Five times sit to stand comments  26.39 sec   3 reps without UEs, last 2 reps with armrests. poor eccentric lower and not standing fully upright     Berg Balance Test   Sit to Stand Able to stand without using hands and stabilize independently    Standing Unsupported Able to stand 2 minutes with supervision    Sitting with Back Unsupported but Feet Supported on Floor or Stool Able to sit 2 minutes under  supervision    Stand to Sit Controls descent by using hands    Transfers Able to transfer safely,  definite need of hands    Standing Unsupported with Eyes Closed Able to stand 10 seconds with supervision    Standing Unsupported with Feet Together Able to place feet together independently and stand for 1 minute with supervision    From Standing, Reach Forward with Outstretched Arm Can reach confidently >25 cm (10")    From Standing Position, Pick up Object from [place] to pick up shoe, needs supervision    From Standing Position, Turn to Look Behind Over each Shoulder Turn sideways only but maintains balance    Turn 360 Degrees Able to turn 360 degrees safely but slowly    Standing Unsupported, Alternately Place Feet on Step/Stool Able to complete >2 steps/needs minimal assist   completed 8 reps with min A\   Standing Unsupported, One Foot in [employer] balance while stepping or standing   ~10 sec   Standing on One Leg Able to lift leg independently and hold 5-10 seconds    Total Score 37      Timed Up and Go Test   Normal TUG (seconds) 21.31   with RW; unsafe turns and lifting walker off the floor              PATIENT EDUCATION: Education details: prognosis, POC, HEP Person educated: Patient and Child(ren) Education method: Explanation, Demonstration, Tactile cues, Verbal cues, and Handouts Education comprehension: verbalized understanding   HOME EXERCISE PROGRAM: Access Code: [email or web address] URL: [email or web address] Date: 05/20/2022 Prepared by: [facility] - Outpatient  Rehab - [facility]  Exercises - Standing Toe Taps  - 1 x daily - 5 x weekly - 2 sets - 20 reps - Alternating Step Backward with Support  - 1 x daily - 5 x weekly - 2 sets - 10 reps - Sit to Stand Without Arm Support  - 1 x daily - 5 x weekly - 2 sets - 10 reps - Standing Hip Abduction with Resistance at Ankles and Counter Support  - 1 x daily - 5 x weekly - 2 sets - 10 reps   GOALS: Goals reviewed with patient? Yes  SHORT TERM GOALS: Target date: 06/10/2022  Patient to be  independent with initial HEP. Baseline: HEP initiated Goal status: INITIAL    LONG TERM GOALS: Target date: 07/01/2022  Patient to be independent with advanced HEP. Baseline: Not yet initiated  Goal status: INITIAL  Patient to demonstrate B LE strength >/=4+/5.  Baseline: See above Goal status: INITIAL  Patient to demonstrate improved postural awareness and correction of posture at rest and with activity. Baseline: see above  Goal status: INITIAL  Patient to complete TUG in <18 sec with LRAD in order to decrease risk of falls.   Baseline: 21 sec Goal status: INITIAL  Patient to demonstrate 5xSTS test in <20 sec in order to decrease risk of falls.  Baseline: 26 sec Goal status: INITIAL  Patient to score at least 43/56 on Berg in order to decrease risk of falls.  Baseline: 37/56 Goal status: INITIAL   ASSESSMENT:  CLINICAL IMPRESSION:  Patient is a 81 y/o F with Huntington Disease presenting to OPPT with c/o progressive LE weakness and involuntary movements. Patient today presenting with slight dysmetria in L hand, forward flexed and forward head posture, limited trunk control in sitting, B hip weakness, gait deviations, and increased time for gait and transfers. Balance testing today indicates an increased  risk of falls. Patient and daughter were educated on balance and strengthening HEP and reported understanding. Would benefit from skilled PT services 2 x/week for 3 weeks and 1x/week for 3 weeks to address aforementioned impairments in order to optimize level of function.     OBJECTIVE IMPAIRMENTS Abnormal gait, decreased activity tolerance, decreased balance, decreased coordination, difficulty walking, decreased strength, decreased safety awareness, increased muscle spasms, impaired flexibility, improper body mechanics, and postural dysfunction.   ACTIVITY LIMITATIONS carrying, lifting, bending, standing, squatting, stairs, transfers, bathing, toileting, dressing, and  reach over head  PARTICIPATION LIMITATIONS: meal prep, community activity, and church  PERSONAL FACTORS Age, Past/current experiences, Transportation, and 3+ comorbidities: breast CA with B mastectomy 2009, depression, Huntington disease, osteoporosis  are also affecting patient's functional outcome.   REHAB POTENTIAL: Good  CLINICAL DECISION MAKING: Evolving/moderate complexity  EVALUATION COMPLEXITY: Moderate  PLAN: PT FREQUENCY:  2 x/week for 3 weeks and 1x/week for 3 weeks  PT DURATION: other: 2 x/week for 3 weeks and 1x/week for 3 weeks  PLANNED INTERVENTIONS: Therapeutic exercises, Therapeutic activity, Neuromuscular re-education, Balance training, Gait training, Patient/Family education, Self Care, Joint mobilization, Stair training, Vestibular training, Canalith repositioning, Aquatic Therapy, Dry Needling, Cryotherapy, Moist heat, Taping, Manual therapy, and Re-evaluation  PLAN FOR NEXT SESSION: reassess HEP; progress balance, hip and core strength   Janene Harvey, PT, DPT 05/20/22 5:33 PM  [facility] at [facility] [address] [place], [place] [address] Phone # [phone] Fax # [phone]

## 2022-05-20 ENCOUNTER — Encounter: Payer: Self-pay | Admitting: Physical Therapy

## 2022-05-20 ENCOUNTER — Ambulatory Visit: Payer: Medicare Other | Attending: Primary Care | Admitting: Physical Therapy

## 2022-05-20 ENCOUNTER — Other Ambulatory Visit: Payer: Self-pay

## 2022-05-20 DIAGNOSIS — R471 Dysarthria and anarthria: Secondary | ICD-10-CM | POA: Insufficient documentation

## 2022-05-20 DIAGNOSIS — R293 Abnormal posture: Secondary | ICD-10-CM | POA: Diagnosis present

## 2022-05-20 DIAGNOSIS — R29818 Other symptoms and signs involving the nervous system: Secondary | ICD-10-CM | POA: Insufficient documentation

## 2022-05-20 DIAGNOSIS — R2681 Unsteadiness on feet: Secondary | ICD-10-CM | POA: Insufficient documentation

## 2022-05-20 DIAGNOSIS — M6281 Muscle weakness (generalized): Secondary | ICD-10-CM | POA: Insufficient documentation

## 2022-05-20 DIAGNOSIS — R41841 Cognitive communication deficit: Secondary | ICD-10-CM | POA: Diagnosis present

## 2022-05-20 DIAGNOSIS — G10 Huntington's disease: Secondary | ICD-10-CM | POA: Diagnosis not present

## 2022-05-20 DIAGNOSIS — R2689 Other abnormalities of gait and mobility: Secondary | ICD-10-CM | POA: Diagnosis present

## 2022-05-20 DIAGNOSIS — R1310 Dysphagia, unspecified: Secondary | ICD-10-CM | POA: Diagnosis present

## 2022-05-24 ENCOUNTER — Ambulatory Visit: Payer: Medicare Other | Admitting: Physical Therapy

## 2022-05-24 ENCOUNTER — Encounter: Payer: Self-pay | Admitting: Physical Therapy

## 2022-05-24 DIAGNOSIS — R2681 Unsteadiness on feet: Secondary | ICD-10-CM

## 2022-05-24 DIAGNOSIS — R2689 Other abnormalities of gait and mobility: Secondary | ICD-10-CM

## 2022-05-24 DIAGNOSIS — M6281 Muscle weakness (generalized): Secondary | ICD-10-CM

## 2022-05-24 DIAGNOSIS — R29818 Other symptoms and signs involving the nervous system: Secondary | ICD-10-CM | POA: Diagnosis not present

## 2022-05-24 NOTE — Therapy (Signed)
OUTPATIENT PHYSICAL THERAPY NEURO TREATMENT   Patient Name: Barbara Krause MRN: [id number] DOB:Nov 30, 1940, 81 y.o., female Today's Date: 05/25/2022   PCP: [name], MD REFERRING PROVIDER: Pleas Koch, NP    PT End of Session - 05/25/22 1444     Visit Number 3    Number of Visits 10    Date for PT Re-Evaluation 07/01/22    Authorization Type Medicare/BCBS    PT Start Time 1407   pt late from [place]   PT Stop Time 7824    PT Time Calculation (min) 36 min    Equipment Utilized During Treatment Gait belt    Activity Tolerance Patient tolerated treatment well    Behavior During Therapy WFL for tasks assessed/performed              Past Medical History:  Diagnosis Date   Breast cancer ([place]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   Depression    History of chicken pox    History of colon polyps    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[place])    Kidney stone    Osteoporosis    Past Surgical History:  Procedure Laterality Date   CESAREAN SECTION     1977 and [place] Bilateral 2009   Patient Active Problem List   Diagnosis Date Noted   Acute bilateral low back pain without sciatica 03/22/2022   Recurrent falls 11/26/2020   Closed fracture dislocation of right elbow 11/24/2020   Moderate protein-calorie malnutrition ([place]) 06/30/2020   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   SAH (subarachnoid hemorrhage) ([place]) 06/15/2020   Aortic atherosclerosis ([place]) 08/30/2019   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[place]) 08/30/2019   Microscopic hematuria 04/02/2019   Chronic fatigue syndrome 04/02/2019   Osteoporosis 04/02/2019   Vitamin B12 deficiency 04/02/2019   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   HTN (hypertension) 03/05/2019   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   Tobacco use 03/05/2019   Depression, major, single episode, complete remission ([place]) 03/05/2019   Hx of breast cancer 03/05/2019   S/P bilateral mastectomy 03/05/2019    ONSET DATE: past several  years  REFERRING DIAG: G10 (ICD-10-CM) - 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 Daughter is requesting that PCA [name]) attends sessions and is educated on HEP/cueing in future sessions.  Pt accompanied by:  daughterGwenette Greet  PERTINENT HISTORY: breast CA with B mastectomy 2009, depression, Huntington disease, osteoporosis  PAIN:  Are you having pain? No  PRECAUTIONS: Fall and Other: osteoporosis and compression deformities in T-spine   PATIENT GOALS improve strength  OBJECTIVE:     TODAY'S TREATMENT: 05/25/22 Activity Comments  Nustep L3 x 5 Ues/LEs Cueing to maintain speed >45 SPM  Seated scapular retraction 10x3" Good form  Sitting ab set with red pball on lap 2x5x5" Cueing to avoid valsalva  Red TB row/extension 10x each CGA for balance and intermittent cueing for proper form; some trouble with L hand grip  R/L SLS rolling ball under foot CW/CCW With B and single UE support; cues to stand tall and look straight ahead; discontinued d/t fatigue   Gait training with RW x174f Cues for increased knee flexion during swing phase and avoiding pushing walker too far from BOS     Below measures were taken at time of initial evaluation unless otherwise specified:  DIAGNOSTIC FINDINGS:  03/22/22 lumbar xray: No acute findings. Multiple compression deformities are identified the within the lumbar spine and lower thoracic spine which appear unchanged when compared with CT of the  chest from 02/18/2022.   COGNITION: Overall cognitive status: Impaired   SENSATION: WFL  COORDINATION: Alt pronation/supination: slightly dysmetric on L Finger to nose: intact B Heel to shin: intact B but limited trunk control    MUSCLE TONE: intact B   POSTURE: rounded shoulders, forward head, and increased thoracic kyphosis  LOWER EXTREMITY ROM:     Active  Right Eval Left Eval  Hip flexion    Hip extension    Hip abduction    Hip adduction    Hip internal rotation    Hip external rotation    Knee flexion    Knee extension    Ankle dorsiflexion 11 15  Ankle plantarflexion    Ankle inversion    Ankle eversion     (Blank rows = not tested)  LOWER EXTREMITY MMT:    MMT (in sitting) Right Eval Left Eval  Hip flexion 4 crepitus 4  Hip extension    Hip abduction 4 4  Hip adduction 4 4-  Hip internal rotation    Hip external rotation    Knee flexion 4+ 4+  Knee extension 4+ crepitus 4+ crepitus  Ankle dorsiflexion 4+ 4+  Ankle plantarflexion 4+ 4+  Ankle inversion    Ankle eversion    (Blank rows = not tested)  GAIT: Gait pattern:  R foot vaulting with forward flexed trunk and slow gait speed  Assistive device utilized: WEnvironmental consultant- 2 wheeled Level of assistance: SBA   FUNCTIONAL TESTs:       PATIENT EDUCATION: Education details: prognosis, POC, HEP Person educated: Patient and Child(ren) Education method: Explanation, Demonstration, Tactile cues, Verbal cues, and Handouts Education comprehension: verbalized understanding   HOME EXERCISE PROGRAM: Access Code: [email or web address] URL: [email or web address] Date: 05/20/2022 [facility]  Exercises - Standing Toe Taps  - 1 x daily - 5 x weekly - 2 sets - 20 reps - Alternating Step Backward with Support  - 1 x daily - 5 x weekly - 2 sets - 10 reps - Sit to Stand Without Arm Support  - 1 x daily - 5  x weekly - 2 sets - 10 reps - Standing Hip  Abduction with Resistance at Ankles and Counter Support  - 1 x daily - 5 x weekly - 2 sets - 10 reps   GOALS: Goals reviewed with patient? Yes  SHORT TERM GOALS: Target date: 06/10/2022  Patient to be independent with initial HEP. Baseline: HEP initiated Goal status: MET    LONG TERM GOALS: Target date: 07/01/2022  Patient to be independent with advanced HEP. Baseline: Not yet initiated  Goal status: IN PROGRESS  Patient to demonstrate B LE strength >/=4+/5.  Baseline: See above Goal status: IN PROGRESS  Patient to demonstrate improved postural awareness and correction of posture at rest and with activity. Baseline: see above  Goal status: IN PROGRESS  Patient to complete TUG in <18 sec with LRAD in order to decrease risk of falls.   Baseline: 21 sec Goal status: IN PROGRESS  Patient to demonstrate 5xSTS test in <20 sec in order to decrease risk of falls.  Baseline: 26 sec Goal status: IN PROGRESS  Patient to score at least 43/56 on Berg in order to decrease risk of falls.  Baseline: 37/56 Goal status: IN PROGRESS   ASSESSMENT:  CLINICAL IMPRESSION:  Patient arrived to session without new complaints. Worked on core strengthening activities with reminders for upright posture and proper positioning. Worked on balance activities focusing on SLS with patient demonstrating good ability to maintain upright posture but some assistance required for assist in coordinating movements. Did c/o fatigue with these activities, thus short rest break provided. Good response to cues with gait training provided. Patient tolerated session well and without complaints at end of session.     OBJECTIVE IMPAIRMENTS Abnormal gait, decreased activity tolerance, decreased balance, decreased coordination, difficulty walking, decreased strength, decreased safety awareness, increased muscle spasms, impaired flexibility, improper body mechanics, and postural dysfunction.   ACTIVITY LIMITATIONS  carrying, lifting, bending, standing, squatting, stairs, transfers, bathing, toileting, dressing, and reach over head  PARTICIPATION LIMITATIONS: meal prep, community activity, and church  PERSONAL FACTORS Age, Past/current experiences, Transportation, and 3+ comorbidities: breast CA with B mastectomy 2009, depression, Huntington disease, osteoporosis  are also affecting patient's functional outcome.   REHAB POTENTIAL: Good  CLINICAL DECISION MAKING: Evolving/moderate complexity  EVALUATION COMPLEXITY: Moderate  PLAN: PT FREQUENCY:  2 x/week for 3 weeks and 1x/week for 3 weeks  PT DURATION: other: 2 x/week for 3 weeks and 1x/week for 3 weeks  PLANNED INTERVENTIONS: Therapeutic exercises, Therapeutic activity, Neuromuscular re-education, Balance training, Gait training, Patient/Family education, Self Care, Joint mobilization, Stair training, Vestibular training, Canalith repositioning, Aquatic Therapy, Dry Needling, Cryotherapy, Moist heat, Taping, Manual therapy, and Re-evaluation  PLAN FOR NEXT SESSION: Daughter requests that PCA [name]) attends sessions and is educated on HEP and cueing; progress balance, hip and core strength   Janene Harvey, PT, DPT 05/25/22 2:48 PM  [facility] at [facility] [address] [place], [place] [address] Phone # [phone] Fax # [phone]

## 2022-05-24 NOTE — Therapy (Signed)
OUTPATIENT PHYSICAL THERAPY TREATMENT NOTE   Patient Name: Barbara Krause MRN: [id number] DOB:07/26/1941, 81 y.o., female Today's Date: 05/24/2022  PCP:  [name], MD REFERRING PROVIDER: Pleas Koch, NP   END OF SESSION:   PT End of Session - 05/24/22 1623     Visit Number 2    Number of Visits 10    Date for PT Re-Evaluation 07/01/22    Authorization Type Medicare/BCBS    PT Start Time 1620    PT Stop Time 1659    PT Time Calculation (min) 39 min    Equipment Utilized During Treatment Gait belt    Activity Tolerance Patient tolerated treatment well    Behavior During Therapy WFL for tasks assessed/performed             Past Medical History:  Diagnosis Date   Breast cancer ([place]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   Depression    History of chicken pox    History of colon polyps    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[place])    Kidney stone    Osteoporosis    Past Surgical History:  Procedure Laterality Date   CESAREAN SECTION     1977 and [place] Bilateral 2009   Patient Active Problem List   Diagnosis Date Noted   Acute bilateral low back pain without sciatica 03/22/2022   Recurrent falls 11/26/2020   Closed fracture dislocation of right elbow 11/24/2020   Moderate protein-calorie malnutrition ([place]) 06/30/2020   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   SAH (subarachnoid hemorrhage) ([place]) 06/15/2020   Aortic atherosclerosis ([place]) 08/30/2019   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[place]) 08/30/2019   Microscopic hematuria 04/02/2019   Chronic fatigue syndrome 04/02/2019   Osteoporosis 04/02/2019   Vitamin B12 deficiency 04/02/2019   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   HTN (hypertension) 03/05/2019   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   Tobacco use 03/05/2019   Depression, major, single episode, complete remission ([place]) 03/05/2019   Hx of breast cancer 03/05/2019   S/P bilateral mastectomy 03/05/2019    REFERRING DIAG: G10 (ICD-10-CM) -  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  Been working on my exercises with my aide.  PAIN:  Are you having pain? No   OBJECTIVE:   TODAY'S TREATMENT: 05/24/2022 Activity Comments  Seated hip adduction, ball squeezes 2 x 10    Seated heel digs x 10 reps   Seated alternating heel raises, then alt toe raises, 2 x 10 reps   Standing wide BOS lateral weightshifting x 10 reps Cues for weightshifting and technique  Standing stagger stance forward/back rocking x 15 reps Cues for weightshifting and technique  Heel/toe raises standing x 10 reps   Forward step ups to 6" step, BUE support, x 10 reps each leg Knee recurvatum noted RLE    Reviewed Exercises in HEP, with cues for technique. - Standing Toe Taps  - 1 x daily - 5 x weekly - 2 sets - 20 reps - Alternating Step Backward with Support  - 1 x daily - 5 x weekly - 2 sets - 10 reps - Sit to Stand Without Arm Support  - 1 x daily - 5 x weekly -  2 sets - 10 reps (cues for use of UEs for slowed descent) - Standing Hip Abduction with Resistance at Ankles and Counter Support  - 1 x daily - 5 x weekly - 2 sets - 10 reps  (objective measures completed at initial evaluation unless otherwise dated)   DIAGNOSTIC FINDINGS:  03/22/22 lumbar xray: No acute findings. Multiple compression deformities are identified the within the lumbar spine and lower thoracic spine which appear unchanged when compared with CT of the chest from 02/18/2022.     COGNITION: Overall cognitive status: Impaired             SENSATION: WFL   COORDINATION: Alt pronation/supination: slightly dysmetric on  L Finger to nose: intact B Heel to shin: intact B but limited trunk control       MUSCLE TONE: intact B     POSTURE: rounded shoulders, forward head, and increased thoracic kyphosis   LOWER EXTREMITY ROM:      Active  Right Eval Left Eval  Hip flexion      Hip extension      Hip abduction      Hip adduction      Hip internal rotation      Hip external rotation      Knee flexion      Knee extension      Ankle dorsiflexion 11 15  Ankle plantarflexion      Ankle inversion      Ankle eversion       (Blank rows = not tested)   LOWER EXTREMITY MMT:     MMT (in sitting) Right Eval Left Eval  Hip flexion 4 crepitus 4  Hip extension      Hip abduction 4 4  Hip adduction 4 4-  Hip internal rotation      Hip external rotation      Knee flexion 4+ 4+  Knee extension 4+ crepitus 4+ crepitus  Ankle dorsiflexion 4+ 4+  Ankle plantarflexion 4+ 4+  Ankle inversion      Ankle eversion      (Blank rows = not tested)   GAIT: Gait pattern:  R foot vaulting with forward flexed trunk and slow gait speed  Assistive device utilized: Walker - 2 wheeled Level of assistance: SBA     FUNCTIONAL TESTs:    [facility] PT Assessment - 05/20/22 0001                Standardized Balance Assessment    Standardized Balance Assessment Five Times Sit to Stand;Timed Up and Go Test;Berg Balance Test     Five times sit to stand comments  26.39 sec   3 reps without UEs, last 2 reps with armrests. poor eccentric lower and not standing fully upright         Berg Balance Test    Sit to Stand Able to stand without using hands and stabilize independently     Standing Unsupported Able to stand 2 minutes with supervision     Sitting with Back Unsupported but Feet Supported on Floor or Stool Able to sit 2 minutes under supervision     Stand to Sit Controls descent by using hands     Transfers Able to transfer safely, definite need of hands     Standing Unsupported with Eyes Closed Able to stand 10  seconds with supervision     Standing Unsupported with Feet Together Able to place feet together independently and stand for 1 minute with supervision  From Standing, Reach Forward with Outstretched Arm Can reach confidently >25 cm (10")     From Standing Position, Pick up Object from Floor Able to pick up shoe, needs supervision     From Standing Position, Turn to Look Behind Over each Shoulder Turn sideways only but maintains balance     Turn 360 Degrees Able to turn 360 degrees safely but slowly     Standing Unsupported, Alternately Place Feet on Step/Stool Able to complete >2 steps/needs minimal assist   completed 8 reps with min A\    Standing Unsupported, One Foot in [employer] balance while stepping or standing   ~10 sec    Standing on One Leg Able to lift leg independently and hold 5-10 seconds     Total Score 37          Timed Up and Go Test    Normal TUG (seconds) 21.31   with RW; unsafe turns and lifting walker off the floor                      PATIENT EDUCATION: Education details: prognosis, POC, HEP Person educated: Patient and Child(ren) Education method: Explanation, Demonstration, Tactile cues, Verbal cues, and Handouts Education comprehension: verbalized understanding     HOME EXERCISE PROGRAM: Access Code: [email or web address] URL: [email or web address] Date: 05/20/2022 Prepared by: [facility] - Outpatient  Rehab - [facility]   Exercises - Standing Toe Taps  - 1 x daily - 5 x weekly - 2 sets - 20 reps - Alternating Step Backward with Support  - 1 x daily - 5 x weekly - 2 sets - 10 reps - Sit to Stand Without Arm Support  - 1 x daily - 5 x weekly - 2 sets - 10 reps - Standing Hip Abduction with Resistance at Ankles and Counter Support  - 1 x daily - 5 x weekly - 2 sets - 10 reps     GOALS: Goals reviewed with patient? Yes   SHORT TERM GOALS: Target date: 06/10/2022   Patient to be independent with initial HEP. Baseline: HEP initiated Goal  status: INITIAL       LONG TERM GOALS: Target date: 07/01/2022   Patient to be independent with advanced HEP. Baseline: Not yet initiated  Goal status: INITIAL   Patient to demonstrate B LE strength >/=4+/5.  Baseline: See above Goal status: INITIAL   Patient to demonstrate improved postural awareness and correction of posture at rest and with activity. Baseline: see above  Goal status: INITIAL   Patient to complete TUG in <18 sec with LRAD in order to decrease risk of falls.   Baseline: 21 sec Goal status: INITIAL   Patient to demonstrate 5xSTS test in <20 sec in order to decrease risk of falls.  Baseline: 26 sec Goal status: INITIAL   Patient to score at least 43/56 on Berg in order to decrease risk of falls.  Baseline: 37/56 Goal status: INITIAL     ASSESSMENT:   CLINICAL IMPRESSION: Skilled PT session today focused on review of HEP given last visit.  Pt needs min cues for technique of her HEP.  Worked also today on seated/standing lower extremity strengthening and balance.  Pt needs cues throughout for optimal posture, body mechanics and weightshifting.  She will continue to benefit from skilled PT towards goals for improved balance and functional mobility.      OBJECTIVE IMPAIRMENTS Abnormal gait, decreased activity tolerance, decreased balance, decreased coordination, difficulty walking, decreased strength, decreased safety  awareness, increased muscle spasms, impaired flexibility, improper body mechanics, and postural dysfunction.    ACTIVITY LIMITATIONS carrying, lifting, bending, standing, squatting, stairs, transfers, bathing, toileting, dressing, and reach over head   PARTICIPATION LIMITATIONS: meal prep, community activity, and church   PERSONAL FACTORS Age, Past/current experiences, Transportation, and 3+ comorbidities: breast CA with B mastectomy 2009, depression, Huntington disease, osteoporosis  are also affecting patient's functional outcome.    REHAB  POTENTIAL: Good   CLINICAL DECISION MAKING: Evolving/moderate complexity   EVALUATION COMPLEXITY: Moderate   PLAN: PT FREQUENCY:  2 x/week for 3 weeks and 1x/week for 3 weeks   PT DURATION: other: 2 x/week for 3 weeks and 1x/week for 3 weeks   PLANNED INTERVENTIONS: Therapeutic exercises, Therapeutic activity, Neuromuscular re-education, Balance training, Gait training, Patient/Family education, Self Care, Joint mobilization, Stair training, Vestibular training, Canalith repositioning, Aquatic Therapy, Dry Needling, Cryotherapy, Moist heat, Taping, Manual therapy, and Re-evaluation   PLAN FOR NEXT SESSION: Add to HEP-consider compliant surfaces, minisquats, SLS/tandem stance; progress balance, hip and core strength    Nickey Canedo W., PT 05/24/2022, 5:03 PM    [facility] [address] [place], [place] [address] Phone # [phone] Fax # [phone]

## 2022-05-25 ENCOUNTER — Other Ambulatory Visit: Payer: Self-pay

## 2022-05-25 ENCOUNTER — Ambulatory Visit: Payer: Medicare Other

## 2022-05-25 ENCOUNTER — Ambulatory Visit: Payer: Medicare Other | Admitting: Physical Therapy

## 2022-05-25 ENCOUNTER — Encounter: Payer: Self-pay | Admitting: Physical Therapy

## 2022-05-25 DIAGNOSIS — R41841 Cognitive communication deficit: Secondary | ICD-10-CM

## 2022-05-25 DIAGNOSIS — R2689 Other abnormalities of gait and mobility: Secondary | ICD-10-CM

## 2022-05-25 DIAGNOSIS — R29818 Other symptoms and signs involving the nervous system: Secondary | ICD-10-CM

## 2022-05-25 DIAGNOSIS — R1310 Dysphagia, unspecified: Secondary | ICD-10-CM

## 2022-05-25 DIAGNOSIS — M6281 Muscle weakness (generalized): Secondary | ICD-10-CM

## 2022-05-25 DIAGNOSIS — R293 Abnormal posture: Secondary | ICD-10-CM

## 2022-05-25 DIAGNOSIS — R471 Dysarthria and anarthria: Secondary | ICD-10-CM

## 2022-05-25 DIAGNOSIS — R2681 Unsteadiness on feet: Secondary | ICD-10-CM

## 2022-05-25 NOTE — Therapy (Signed)
OUTPATIENT SPEECH LANGUAGE PATHOLOGY EVALUATION   Patient Name: Barbara Krause MRN: [id number] DOB:[birth year], 81 y.o., female Today's Date: 05/25/2022  PCP: Waunita Schooner, MD REFERRING PROVIDER: Pleas Koch, NP    End of Session - 05/25/22 1751     Visit Number 1    Number of Visits 9    Date for SLP Re-Evaluation 07/30/22    SLP Start Time 20    SLP Stop Time  1400    SLP Time Calculation (min) 40 min    Activity Tolerance Patient tolerated treatment well             Past Medical History:  Diagnosis Date   Breast cancer ([place]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   Depression    History of chicken pox    History of colon polyps    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[place])    Kidney stone    Osteoporosis    Past Surgical History:  Procedure Laterality Date   CESAREAN SECTION     1977 and [place] Bilateral 2009   Patient Active Problem List   Diagnosis Date Noted   Acute bilateral low back pain without sciatica 03/22/2022   Recurrent falls 11/26/2020   Closed fracture dislocation of right elbow 11/24/2020   Moderate protein-calorie malnutrition ([place]) 06/30/2020   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   SAH (subarachnoid hemorrhage) ([place]) 06/15/2020   Aortic atherosclerosis ([place]) 08/30/2019   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[place]) 08/30/2019   Microscopic hematuria 04/02/2019   Chronic fatigue syndrome 04/02/2019   Osteoporosis 04/02/2019   Vitamin B12 deficiency 04/02/2019   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   HTN (hypertension) 03/05/2019   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   Tobacco use 03/05/2019   Depression, major, single episode, complete remission ([place]) 03/05/2019   Hx of breast cancer 03/05/2019   S/P bilateral mastectomy 03/05/2019    ONSET DATE: Diagnosed 2020   REFERRING DIAG: G10 (ICD-10-CM) - Huntington disease   THERAPY DIAG:  Dysarthria and anarthria - Plan: SLP plan of care cert/re-cert  Dysphagia, unspecified type -  Plan: SLP plan of care cert/re-cert  Cognitive communication deficit - Plan: SLP plan of care cert/re-cert  Rationale for Evaluation and Treatment Rehabilitation  SUBJECTIVE:   SUBJECTIVE STATEMENT: "I'm just not able to converse as easily (due to speech changes)." Pt accompanied by: family member (daughter [name])  PERTINENT HISTORY: 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 No   FALLS: Has patient fallen in last 6 months?  Yes , one.  LIVING ENVIRONMENT: Lives with: lives alone with aides 4 hours/day Lives in: House/apartment  PLOF:  Level of assistance: Needed assistance with ADLs, Needed assistance with IADLS Employment: Retired   PATIENT GOALS  Improve speech clarity  OBJECTIVE:   DIAGNOSTIC FINDINGS:  MODIFIED BARIUM SWALLOW 10-23-19 ASSESSMENT: Pt appears to present w/ slight-mild oral phase dysphagia; almost a min lack of awareness of oral residue during bolus management of food trials. During trials of increased texture/solids, pt exhibited min bolus piecemealing w/ foods; inconsistent oral (diffuse) residue noted almost as if unaware of the residue. Using the strategy (taught by SLP) of lingual sweep w/ a Dry, f/u swallow, pt cleared oral cavity completely. This was utilized intermittently as instructed by SLP. During the pharyngeal phase, Pt exhibited timely pharyngeal swallow initiation w/ No laryngeal penetration or aspiration noted; adequate and timely airway closure. No  signifcant pharyngeal residue remained post swallows indicating adequate laryngeal excursion and pharyngeal pressure during the swallows. During the Esophageal phase, slower Esophageal clearing noted; bolus stasis occurred x1 w/ trial of graham cracker(moistened w/ puree). ANY Esophageal phase dysmotility can result in Reflux activity and include overt s/s including the "Dry" cough, or  Reflux cough. Recommend monitoring for such s/s during/post meals.  PLAN/RECOMMENDATIONS:             A. Diet: more of a Mech Soft diet consistency w/ Meats CUT well and moistened well; Thin liquids. Pt was taught using a Puree for swallowing Pills if needed for ease of swallowing/clearing             B. Swallowing Precautions: general aspiration precautions including f/u, Dry swallow to clear any remaining oral residue.              C. Recommended consultation to: f/u w/ GI for assessment of Esophageal phase motility; further education and management             D. Therapy recommendations: None at this time. Recommended pt monitor for any s/s of aspiration Esophageal phase dysmotility and overt coughing w/ oral intake writing it down in a Food Journal to increase awareness of any similarities or issues w/ particular foods/times             E. Results and recommendations were discussed w/ patient; video viewed; questions answered; precautions/strategies discussed and practiced Dysphagia, pharyngoesophageal phase  **SLP TO MONITOR [name] SWALLOWING. SLP TO ASSESS [name] SWALLOW SKILLS WITH FOOD FROM HOME AND MAKE RECOMMENDATION FOR OBJECTIVE SWALLOW EVAL IF NECESSARY.  PT DOES *NOT* DESIRE NON-ORAL FEEDING OPTIONS.   COGNITION: Overall cognitive status: History of cognitive impairments - at baseline Areas of impairment:  Memory: Impaired: Short term Functional deficits: [name] corrected pt re: some details about medical hx during evaluation. [name] states pt has had more frequent demo of memory deficits since pt's TBI in 2021.   AUDITORY COMPREHENSION: Overall auditory comprehension: Appears intact. Pt, upon questioning, is without concerns in this area.  EXPRESSION: verbal  VERBAL EXPRESSION: Appears intact. Upon questioning, pt is without concerns. Level of generative/spontaneous verbalization: conversation  MOTOR SPEECH: Overall motor speech: impaired Level of impairment:  Phrase Respiration: thoracic breathing and clavicular breathing Phonation: normal Resonance: WFL Articulation: Impaired: phrase Intelligibility: Intelligible however pt states this is getting more difficult as more people are asking her to repeat - rarely. Worse on phone, per daughter. Interfering components: premorbid status Effective technique: over articulate  ORAL MOTOR EXAMINATION Overall status: Impaired:   Labial: Bilateral (Coordination) Lingual: Bilateral (Coordination) Facial: Bilateral (Coordination) Comments: Choreic movements/writhing made movement/strength difficult to assess.     PATIENT REPORTED OUTCOME MEASURES (PROM): Communication Participation Item Bank:   and Communication Effectiveness Survey: to be completed in first 1-2 sessions   TODAY'S TREATMENT:  Discussed options for dysphagia level 3 and provided handout for diet items, reviewed pt's last MBSS and educated pt and daughter re: additional swallows recommended.  SLP worked with pt to find compensation for speech clarity that was best for pt and believed at this time that overarticulation is most effective compensation.    PATIENT EDUCATION: Education details: see above in "today's treatment" Person educated: Patient and Child(ren) Education method: Explanation, Demonstration, Verbal cues, and Handouts Education comprehension: verbalized understanding, returned demonstration, and needs further education     GOALS: Goals reviewed with patient? Yes  SHORT TERM GOALS: Target date: 06/22/2022    Pt will complete PROM in  first 2 therapy sessions Baseline: Goal status: INITIAL  2.  Pt will complete HEP for speech clarity compensations with rare min A in 2 sessions Baseline:  Goal status: INITIAL  3.  Pt will demo speech compensations functionally in 10 minutes simple conversation  Baseline:  Goal status: INITIAL  4.  Pt will participate in clinical swallow assessment with food from home, and  participate in objective swallow eval if necessary Baseline:  Goal status: INITIAL  5.  Pt will demo swallow precautions with food in 2 therapy sessions with modified independence Baseline:  Goal status: INITIAL   LONG TERM GOALS: Target date: 07/30/2022    Pt/caregiver will report that memory strategies pt engages in cont to improve pt's overall memory, between 3 sessions Baseline:  Goal status: INITIAL  2.  Pt will complete HEP for speech clarity compensations with modified independence/assistance from aide/s in 2 sessions Baseline:  Goal status: INITIAL  3.  Pt will demo speech compensations functionally in 10 minutes simple conversation over three sessions Baseline:  Goal status: INITIAL  4.  [name] score on PROM for communication will improve when compared to initial score Baseline:  Goal status: INITIAL   ASSESSMENT:  CLINICAL IMPRESSION: Patient is a 81 y.o. female who was seen today for assessment of speech clarity and for review of swallow assessment from 2020. At this time, SLP to assess pt's swallowing clinically with food from home and make recommendation for objective swallow eval PRN. Pt reports biggest concern at this time is incr frequency of people asking her to repeat- daughter states more on the phone than in person. Pt will benefit from skilled ST.  OBJECTIVE IMPAIRMENTS include memory, dysarthria, and dysphagia. These impairments are limiting patient from household responsibilities, ADLs/IADLs, effectively communicating at home and in community, and safety when swallowing. Factors affecting potential to achieve goals and functional outcome are co-morbidities and medical prognosis. Patient will benefit from skilled SLP services to address above impairments and improve overall function.  REHAB POTENTIAL: Good  PLAN: SLP FREQUENCY: 1x/week  SLP DURATION: 8 weeks  PLANNED INTERVENTIONS: Aspiration precaution training, Pharyngeal strengthening exercises, Diet  toleration management , Environmental controls, Trials of upgraded texture/liquids, Internal/external aids, Oral motor tasks, and Functional tasks and memory compensations    [place], [place] 05/25/2022, 5:54 PM

## 2022-05-25 NOTE — Patient Instructions (Signed)
   Dysphagia diet handout  I suggest you eat dysphagia level 3 foods with additional swallow for all bites and sips, using condiments/gravies/sauces whenever possible (as you have been doing) to encourage passage of the food through your throat. You could also alternate bites and sips to assist with this. When having your weekly steak, cut up into SMALL pieces and chew thoroughly!  Please keep track of the food that you cough with at home and bring back to me to see if there are any commonalities.   As you said your talking is what is most bothersome to you at this time, we will work mainly on that. For now concentrate on "pronounciating your words" more, as you did when I asked you to repeat your sentene at the end of out time today. That REALLY helped!! You may need to do this more at the end of the day, as fatigue will play a factor in how slurred your speech may become.

## 2022-06-01 ENCOUNTER — Ambulatory Visit: Payer: Medicare Other

## 2022-06-01 ENCOUNTER — Encounter: Payer: Self-pay | Admitting: Physical Therapy

## 2022-06-01 ENCOUNTER — Ambulatory Visit: Payer: Medicare Other | Attending: Primary Care | Admitting: Physical Therapy

## 2022-06-01 DIAGNOSIS — R29818 Other symptoms and signs involving the nervous system: Secondary | ICD-10-CM | POA: Insufficient documentation

## 2022-06-01 DIAGNOSIS — R471 Dysarthria and anarthria: Secondary | ICD-10-CM | POA: Diagnosis present

## 2022-06-01 DIAGNOSIS — R41841 Cognitive communication deficit: Secondary | ICD-10-CM | POA: Diagnosis present

## 2022-06-01 DIAGNOSIS — R2681 Unsteadiness on feet: Secondary | ICD-10-CM | POA: Diagnosis not present

## 2022-06-01 DIAGNOSIS — M6281 Muscle weakness (generalized): Secondary | ICD-10-CM | POA: Diagnosis present

## 2022-06-01 DIAGNOSIS — R2689 Other abnormalities of gait and mobility: Secondary | ICD-10-CM | POA: Diagnosis present

## 2022-06-01 DIAGNOSIS — R1310 Dysphagia, unspecified: Secondary | ICD-10-CM

## 2022-06-01 DIAGNOSIS — R293 Abnormal posture: Secondary | ICD-10-CM | POA: Diagnosis present

## 2022-06-01 NOTE — Therapy (Signed)
OUTPATIENT PHYSICAL THERAPY NEURO TREATMENT   Patient Name: Barbara Krause MRN: [id number] DOB:1941/06/15, 81 y.o., female Today's Date: 06/01/2022   PCP: [name], MD REFERRING PROVIDER: Pleas Koch, NP    PT End of Session - 06/01/22 1537     Visit Number 4    Number of Visits 10    Date for PT Re-Evaluation 07/01/22    Authorization Type Medicare/BCBS    Progress Note Due on Visit 10    PT Start Time 1448    PT Stop Time 1528    PT Time Calculation (min) 40 min    Activity Tolerance Patient tolerated treatment well    Behavior During Therapy [facility] for tasks assessed/performed               Past Medical History:  Diagnosis Date   Breast cancer ([place]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   Depression    History of chicken pox    History of colon polyps    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[place])    Kidney stone    Osteoporosis    Past Surgical History:  Procedure Laterality Date   CESAREAN SECTION     1977 and [place] Bilateral 2009   Patient Active Problem List   Diagnosis Date Noted   Acute bilateral low back pain without sciatica 03/22/2022   Recurrent falls 11/26/2020   Closed fracture dislocation of right elbow 11/24/2020   Moderate protein-calorie malnutrition ([place]) 06/30/2020   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   SAH (subarachnoid hemorrhage) ([place]) 06/15/2020   Aortic atherosclerosis ([place]) 08/30/2019   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[place]) 08/30/2019   Microscopic hematuria 04/02/2019   Chronic fatigue syndrome 04/02/2019   Osteoporosis 04/02/2019   Vitamin B12 deficiency 04/02/2019   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   HTN (hypertension) 03/05/2019   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   Tobacco use 03/05/2019   Depression, major, single episode, complete remission ([place]) 03/05/2019   Hx of breast cancer 03/05/2019   S/P bilateral mastectomy 03/05/2019    ONSET DATE: past several years  REFERRING DIAG: G10 (ICD-10-CM) -  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[place])  THERAPY DIAG:  Unsteadiness on feet  Other abnormalities of gait and mobility  Muscle weakness (generalized)  Other symptoms and signs involving the nervous system  Rationale for Evaluation and Treatment Rehabilitation  SUBJECTIVE:                                                                                                                                                                                              SUBJECTIVE STATEMENT: Doing well and feeling  good, no pain, I'd like to focus on my strength   Pt accompanied by:  daughterGwenette Krause  PERTINENT HISTORY: breast CA with B mastectomy 2009, depression, Huntington disease, osteoporosis  PAIN:  Are you having pain? No  PRECAUTIONS: Fall and Other: osteoporosis and compression deformities in T-spine   PATIENT GOALS improve strength  OBJECTIVE:   TODAY'S TREATMENT 06/01/22   Nustep L3 x6 minutes BUEs/BLEs  Strengthening: - LAQs 3# 2x10 B - shoulder extensions red TB 2x10  - seated marches with TA set 3# B  2x10  - Bicep curls 3# 2x10 B  - STS 2x10  - side stepping along edge of mat table with RW x2 laps 3# on each LE (2 rounds)      Below measures were taken at time of initial evaluation unless otherwise specified:  DIAGNOSTIC FINDINGS:  03/22/22 lumbar xray: No acute findings. Multiple compression deformities are identified the within the lumbar spine and lower thoracic spine which appear unchanged when compared with CT of the chest from 02/18/2022.   COGNITION: Overall cognitive status: Impaired   SENSATION: WFL  COORDINATION: Alt pronation/supination: slightly dysmetric on L Finger to nose: intact B Heel to shin: intact B but limited trunk control    MUSCLE TONE: intact B   POSTURE: rounded shoulders, forward head, and increased thoracic kyphosis  LOWER EXTREMITY ROM:     Active  Right Eval Left Eval  Hip flexion    Hip extension    Hip abduction    Hip  adduction    Hip internal rotation    Hip external rotation    Knee flexion    Knee extension    Ankle dorsiflexion 11 15  Ankle plantarflexion    Ankle inversion    Ankle eversion     (Blank rows = not tested)  LOWER EXTREMITY MMT:    MMT (in sitting) Right Eval Left Eval  Hip flexion 4 crepitus 4  Hip extension    Hip abduction 4 4  Hip adduction 4 4-  Hip internal rotation    Hip external rotation    Knee flexion 4+ 4+  Knee extension 4+ crepitus 4+ crepitus  Ankle dorsiflexion 4+ 4+  Ankle plantarflexion 4+ 4+  Ankle inversion    Ankle eversion    (Blank rows = not tested)  GAIT: Gait pattern:  R foot vaulting with forward flexed trunk and slow gait speed  Assistive device utilized: Environmental consultant - 2 wheeled Level of assistance: SBA   FUNCTIONAL TESTs:       PATIENT EDUCATION: Education details: prognosis, POC, HEP Person educated: Patient and Child(ren) Education method: Explanation, Demonstration, Tactile cues, Verbal cues, and Handouts Education comprehension: verbalized understanding   HOME EXERCISE PROGRAM: Access Code: [email or web address] URL: [email or web address] Date: 05/20/2022 Prepared by: [facility] - Outpatient  Rehab - [facility]  Exercises - Standing Toe Taps  - 1 x daily - 5 x weekly - 2 sets - 20 reps - Alternating Step Backward with Support  - 1 x daily - 5 x weekly - 2 sets - 10 reps - Sit to Stand Without Arm Support  - 1 x daily - 5 x weekly - 2 sets - 10 reps - Standing Hip Abduction with Resistance at Ankles and Counter Support  - 1 x daily - 5 x weekly - 2 sets - 10 reps   GOALS: Goals reviewed with patient? Yes  SHORT TERM GOALS: Target date: 06/10/2022  Patient to be independent with initial HEP. Baseline: HEP initiated  Goal status: MET    LONG TERM GOALS: Target date: 07/01/2022  Patient to be independent with advanced HEP. Baseline: Not yet initiated  Goal status: IN PROGRESS  Patient to demonstrate B LE  strength >/=4+/5.  Baseline: See above Goal status: IN PROGRESS  Patient to demonstrate improved postural awareness and correction of posture at rest and with activity. Baseline: see above  Goal status: IN PROGRESS  Patient to complete TUG in <18 sec with LRAD in order to decrease risk of falls.   Baseline: 21 sec Goal status: IN PROGRESS  Patient to demonstrate 5xSTS test in <20 sec in order to decrease risk of falls.  Baseline: 26 sec Goal status: IN PROGRESS  Patient to score at least 43/56 on [place] in order to decrease risk of falls.  Baseline: 37/56 Goal status: IN PROGRESS   ASSESSMENT:  CLINICAL IMPRESSION:  Barbara Krause arrives today doing well, no major complaints today and would like to focus on her strength this session. We warmed up on the Nustep then worked on a combination of core and BLE strengthening today. Tolerated session well but did need multiple short rest breaks due to impaired functional activity tolerance. Will continue to progress as able and tolerated.      OBJECTIVE IMPAIRMENTS Abnormal gait, decreased activity tolerance, decreased balance, decreased coordination, difficulty walking, decreased strength, decreased safety awareness, increased muscle spasms, impaired flexibility, improper body mechanics, and postural dysfunction.   ACTIVITY LIMITATIONS carrying, lifting, bending, standing, squatting, stairs, transfers, bathing, toileting, dressing, and reach over head  PARTICIPATION LIMITATIONS: meal prep, community activity, and church  PERSONAL FACTORS Age, Past/current experiences, Transportation, and 3+ comorbidities: breast CA with B mastectomy 2009, depression, Huntington disease, osteoporosis  are also affecting patient's functional outcome.   REHAB POTENTIAL: Good  CLINICAL DECISION MAKING: Evolving/moderate complexity  EVALUATION COMPLEXITY: Moderate  PLAN: PT FREQUENCY:  2 x/week for 3 weeks and 1x/week for 3 weeks  PT DURATION: other: 2 x/week  for 3 weeks and 1x/week for 3 weeks  PLANNED INTERVENTIONS: Therapeutic exercises, Therapeutic activity, Neuromuscular re-education, Balance training, Gait training, Patient/Family education, Self Care, Joint mobilization, Stair training, Vestibular training, Canalith repositioning, Aquatic Therapy, Dry Needling, Cryotherapy, Moist heat, Taping, Manual therapy, and Re-evaluation  PLAN FOR NEXT SESSION: Daughter requests that PCA [name]) attends sessions and is educated on HEP and cueing; progress balance, hip and core strength    Cyril Mourning U PT DPT PN2  06/01/2022, 3:38 PM   [facility] at [facility] [address], [place],  [address] Phone # [phone] Fax # [phone]

## 2022-06-01 NOTE — Patient Instructions (Signed)
   Next session (Tuesday 06/08/22), please bring some food from home so I can assess your swallowing with practical food items. Thanks!  =================================== Conversational Sentences were provided to you today. Please read them through, being as LOUD and CLEAR with your speech as you can. Take a good breath before each one.

## 2022-06-01 NOTE — Therapy (Signed)
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   Depression    History of chicken pox    History of colon polyps    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[place])    Kidney stone    Osteoporosis    Past Surgical History:  Procedure Laterality Date   CESAREAN SECTION     1977 and [place] Bilateral 2009   Patient Active Problem List   Diagnosis Date Noted   Acute bilateral low back pain without sciatica 03/22/2022   Recurrent falls 11/26/2020   Closed fracture dislocation of right elbow 11/24/2020   Moderate protein-calorie malnutrition ([place]) 06/30/2020   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   SAH (subarachnoid hemorrhage) ([place]) 06/15/2020   Aortic atherosclerosis ([place]) 08/30/2019   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[place]) 08/30/2019   Microscopic hematuria 04/02/2019   Chronic fatigue syndrome 04/02/2019   Osteoporosis 04/02/2019   Vitamin B12 deficiency 04/02/2019   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   HTN (hypertension) 03/05/2019   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   Tobacco use 03/05/2019   Depression, major, single episode, complete remission ([place]) 03/05/2019   Hx of breast cancer 03/05/2019   S/P bilateral mastectomy 03/05/2019    ONSET DATE: Diagnosed 2020   REFERRING DIAG: G10 (ICD-10-CM) - Huntington disease   THERAPY DIAG:  Dysarthria and anarthria  Cognitive communication deficit  Dysphagia, unspecified  type  Rationale for Evaluation and Treatment Rehabilitation  SUBJECTIVE:   SUBJECTIVE STATEMENT: "How did I do?" Pt accompanied by:  personal aide, [name]    PERTINENT HISTORY: 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 No  PATIENT GOALS  Improve speech clarity  OBJECTIVE:   DIAGNOSTIC FINDINGS:  MODIFIED BARIUM SWALLOW 10-23-19 ASSESSMENT: Pt appears to present w/ slight-mild oral phase dysphagia; almost a min lack of awareness of oral residue during bolus management of food trials. During trials of increased texture/solids, pt exhibited min bolus piecemealing w/ foods; inconsistent oral (diffuse) residue noted almost as if unaware of the residue. Using the strategy (taught by SLP) of lingual sweep w/ a Dry, f/u swallow, pt cleared oral cavity completely. This was utilized intermittently as instructed by SLP. During the pharyngeal phase, Pt exhibited timely pharyngeal swallow initiation w/ No laryngeal penetration or aspiration noted; adequate and timely airway closure. No signifcant pharyngeal residue remained post swallows indicating adequate laryngeal excursion and pharyngeal pressure during the swallows. During the Esophageal phase, slower Esophageal clearing noted; bolus stasis occurred x1 w/ trial of graham cracker(moistened w/ puree). ANY Esophageal phase dysmotility can result in Reflux activity and include overt s/s including the "Dry" cough, or Reflux cough. Recommend monitoring for such s/s during/post meals.  PLAN/RECOMMENDATIONS:  A. Diet: more of a Mech Soft diet consistency w/ Meats CUT well and moistened well; Thin liquids. Pt was taught using a Puree for swallowing Pills if needed for ease of swallowing/clearing             B. Swallowing Precautions: general aspiration precautions including f/u, Dry swallow to clear any remaining oral residue.               C. Recommended consultation to: f/u w/ GI for assessment of Esophageal phase motility; further education and management             D. Therapy recommendations: None at this time. Recommended pt monitor for any s/s of aspiration Esophageal phase dysmotility and overt coughing w/ oral intake writing it down in a Food Journal to increase awareness of any similarities or issues w/ particular foods/times             E. Results and recommendations were discussed w/ patient; video viewed; questions answered; precautions/strategies discussed and practiced Dysphagia, pharyngoesophageal phase  **SLP TO MONITOR [name] SWALLOWING. SLP TO ASSESS [name] SWALLOW SKILLS WITH FOOD FROM HOME AND MAKE RECOMMENDATION FOR OBJECTIVE SWALLOW EVAL IF NECESSARY.  PT DOES *NOT* DESIRE NON-ORAL FEEDING OPTIONS.     PATIENT REPORTED OUTCOME MEASURES (PROM): Pt did not think she participates less, nor her speech less effective than 12 months prior - [name] agreed with this. Pt took the Speech Difficulties - short form home  to complete as a PROM.   TODAY'S TREATMENT:  06/01/22: SLP explained rationale for having pt bring her own food for bedside/clinical swallow eval next session. SLP provided conversational sentences for pt to practice LOUD and CLEAR at home. She req'd occasional mod cues for breath support; occasional min-mod cues for overarticulation ("open your mouth") as pt's speech became ataxic intermittently. SLP explained how HEP for overartic should be completed. SLP modeled for [name] how to cue pt for overarticulation and for breath support. SLP used written/drawing cues for explaining talking at the top of the breath.   05/25/22 (eval): Discussed options for dysphagia level 3 and provided handout for diet items, reviewed pt's last MBSS and educated pt and daughter re: additional swallows recommended.  SLP worked with pt to find compensation for speech clarity that was best for pt and believed at this time that  overarticulation is most effective compensation.    PATIENT EDUCATION: Education details: see above in "today's treatment" Person educated: Patient and Caregiver [name] Education method: Explanation, Demonstration, Verbal cues, Handouts, and written/drawing cues, modeling Education comprehension: verbalized understanding, returned demonstration, and needs further education     GOALS: Goals reviewed with patient? Yes  SHORT TERM GOALS: Target date: 06/22/2022    Pt will complete PROM in first 2 therapy sessions Baseline: Goal status: Ongoing  2.  Pt will complete HEP for speech clarity compensations with rare min A in 2 sessions Baseline:  Goal status: Ongoing  3.  Pt will demo speech compensations functionally in 10 minutes simple conversation  Baseline:  Goal status: Ongoing  4.  Pt will participate in clinical swallow assessment with food from home, and participate in objective swallow eval if necessary Baseline:  Goal status: Ongoing  5.  Pt will demo swallow precautions with food in 2 therapy sessions with modified independence Baseline:  Goal status: Ongoing   LONG TERM GOALS: Target date: 07/30/2022    Pt/caregiver will report that memory strategies pt engages in cont to improve pt's overall memory, between 3 sessions Baseline:  Goal status: Ongoing  2.  Pt will complete HEP for speech clarity compensations with modified independence/assistance from aide/s in 2 sessions Baseline:  Goal status: Ongoing  3.  Pt will demo speech compensations functionally in 10 minutes simple conversation over three sessions Baseline:  Goal status: Ongoing  4.  [name] score on PROM for communication will improve when compared to initial score Baseline:  Goal status: Ongoing   ASSESSMENT:  CLINICAL IMPRESSION: Patient is a 81 y.o. female who was seen today for treatment of speech clarity and for swallow safety. Next session, pt will bring food from home for SLP to assess pt's  swallowing clinically and make recommendation for objective swallow eval PRN. Pt reports biggest concern at this time is incr frequency of people asking her to repeat- daughter states more on the phone than in person. [name] (aide) indicated today that she only rarely has difficulty understanding pt, and Andilyn said Jenny Reichmann (weekend aide) also does not have much difficulty understanding pt. Pt will cont to benefit from skilled ST.  OBJECTIVE IMPAIRMENTS include memory, dysarthria, and dysphagia. These impairments are limiting patient from household responsibilities, ADLs/IADLs, effectively communicating at home and in community, and safety when swallowing. Factors affecting potential to achieve goals and functional outcome are co-morbidities and medical prognosis. Patient will benefit from skilled SLP services to address above impairments and improve overall function.  REHAB POTENTIAL: Good  PLAN: SLP FREQUENCY: 1x/week  SLP DURATION: 8 weeks  PLANNED INTERVENTIONS: Aspiration precaution training, Pharyngeal strengthening exercises, Diet toleration management , Environmental controls, Trials of upgraded texture/liquids, Internal/external aids, Oral motor tasks, and Functional tasks and memory compensations    Candor, [place] 06/01/2022, 3:37 PM

## 2022-06-03 ENCOUNTER — Ambulatory Visit: Payer: Medicare Other | Admitting: Physical Therapy

## 2022-06-08 ENCOUNTER — Ambulatory Visit: Payer: Medicare Other | Admitting: Rehabilitative and Restorative Service Providers"

## 2022-06-08 ENCOUNTER — Telehealth: Payer: Self-pay | Admitting: Family Medicine

## 2022-06-08 ENCOUNTER — Ambulatory Visit: Payer: Medicare Other

## 2022-06-08 ENCOUNTER — Encounter: Payer: Self-pay | Admitting: Rehabilitative and Restorative Service Providers"

## 2022-06-08 DIAGNOSIS — R41841 Cognitive communication deficit: Secondary | ICD-10-CM

## 2022-06-08 DIAGNOSIS — G10 Huntington's disease: Secondary | ICD-10-CM

## 2022-06-08 DIAGNOSIS — R1319 Other dysphagia: Secondary | ICD-10-CM

## 2022-06-08 DIAGNOSIS — R2681 Unsteadiness on feet: Secondary | ICD-10-CM | POA: Diagnosis not present

## 2022-06-08 DIAGNOSIS — R2689 Other abnormalities of gait and mobility: Secondary | ICD-10-CM

## 2022-06-08 DIAGNOSIS — M6281 Muscle weakness (generalized): Secondary | ICD-10-CM

## 2022-06-08 DIAGNOSIS — R293 Abnormal posture: Secondary | ICD-10-CM

## 2022-06-08 DIAGNOSIS — R471 Dysarthria and anarthria: Secondary | ICD-10-CM

## 2022-06-08 DIAGNOSIS — R29818 Other symptoms and signs involving the nervous system: Secondary | ICD-10-CM

## 2022-06-08 DIAGNOSIS — R1310 Dysphagia, unspecified: Secondary | ICD-10-CM

## 2022-06-08 DIAGNOSIS — R6339 Other feeding difficulties: Secondary | ICD-10-CM

## 2022-06-08 NOTE — Therapy (Signed)
OUTPATIENT SPEECH LANGUAGE PATHOLOGY TREATMENT NOTE   Patient Name: Barbara Krause MRN: [id number] DOB:07/01/1941, 81 y.o., female Today's Date: 06/08/2022  PCP: Waunita Schooner, MD REFERRING PROVIDER: Pleas Koch, NP    End of Session - 06/08/22 1745     Visit Number 3    Number of Visits 9    Date for SLP Re-Evaluation 07/30/22    SLP Start Time 2119    SLP Stop Time  4174    SLP Time Calculation (min) 40 min    Activity Tolerance Patient tolerated treatment well               Past Medical History:  Diagnosis Date   Breast cancer ([place]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   Depression    History of chicken pox    History of colon polyps    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[place])    Kidney stone    Osteoporosis    Past Surgical History:  Procedure Laterality Date   CESAREAN SECTION     1977 and [place] Bilateral 2009   Patient Active Problem List   Diagnosis Date Noted   Acute bilateral low back pain without sciatica 03/22/2022   Recurrent falls 11/26/2020   Closed fracture dislocation of right elbow 11/24/2020   Moderate protein-calorie malnutrition ([place]) 06/30/2020   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   SAH (subarachnoid hemorrhage) ([place]) 06/15/2020   Aortic atherosclerosis ([place]) 08/30/2019   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[place]) 08/30/2019   Microscopic hematuria 04/02/2019   Chronic fatigue syndrome 04/02/2019   Osteoporosis 04/02/2019   Vitamin B12 deficiency 04/02/2019   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 No  PATIENT GOALS  Improve speech clarity  OBJECTIVE:   DIAGNOSTIC FINDINGS:  MODIFIED BARIUM SWALLOW 10-23-19 ASSESSMENT: Pt appears to present w/ slight-mild oral phase dysphagia; almost a min lack of awareness of oral residue during bolus management of food trials. During trials of increased texture/solids, pt exhibited min bolus piecemealing w/ foods; inconsistent oral (diffuse) residue noted almost as if unaware of the residue. Using the strategy (taught by SLP) of lingual sweep w/ a Dry, f/u swallow, pt cleared oral cavity completely. This was utilized intermittently as instructed by SLP. During the pharyngeal phase, Pt exhibited timely pharyngeal swallow initiation w/ No laryngeal penetration or aspiration noted; adequate and timely airway closure. No signifcant pharyngeal residue remained post swallows indicating adequate laryngeal excursion and pharyngeal pressure during the swallows. During the Esophageal phase, slower Esophageal clearing noted; bolus stasis occurred x1 w/ trial of graham cracker(moistened w/ puree). ANY Esophageal phase dysmotility can result in Reflux activity and include overt s/s including the "Dry" cough, or Reflux cough. Recommend monitoring for such s/s during/post meals.  PLAN/RECOMMENDATIONS:  A. Diet: more of a Mech Soft diet consistency w/ Meats CUT well and moistened well; Thin liquids. Pt was taught using a Puree for swallowing Pills if needed for ease of swallowing/clearing             B. Swallowing Precautions: general aspiration precautions including f/u, Dry swallow to clear any remaining oral residue.              C. Recommended consultation to:  f/u w/ GI for assessment of Esophageal phase motility; further education and management             D. Therapy recommendations: None at this time. Recommended pt monitor for any s/s of aspiration Esophageal phase dysmotility and overt coughing w/ oral intake writing it down in a Food Journal to increase awareness of any similarities or issues w/ particular foods/times             E. Results and recommendations were discussed w/ patient; video viewed; questions answered; precautions/strategies discussed and practiced Dysphagia, pharyngoesophageal phase  PT DOES *NOT* DESIRE NON-ORAL FEEDING OPTIONS.    PATIENT REPORTED OUTCOME MEASURES (PROM): Pt did not think she participates less, nor her speech less effective than 12 months prior - Barbara Krause agreed with this. Pt took the Speech Difficulties - short form home  to complete as a PROM.   ORAL MOTOR EXAMINATION Overall status: Impaired:   Labial: Bilateral (Strength and Coordination) Lingual: Bilateral (ROM, Strength, and Coordination) Velum: ROM Comments: Lingual strength reduced more on lt than rt  CLINICAL SWALLOW ASSESSMENT:   Current diet: primarily dys I-III, with some occasional regular diet items, and thin liquids Dentition: adequate natural dentition Patient directly observed with POs: Yes: regular (bites of breaded (now soft) pork chop), dysphagia 1 (puree), and thin liquids  Feeding: able to feed self Liquids provided by: cup Oral phase signs and symptoms: right pocketing (very mild) Pharyngeal phase signs and symptoms: multiple swallows, immediate throat clear, delayed throat clear, and delayed cough Pt did not follow precautions from previous MBSS three years ago. Suspect pt's memory deficit negatively impacted her ability to do this.  SLP recommended precautions delineated in "Pt instructions": small bites/sips, double swallow, swallow solids prior to taking sip liquid - exception is to take 2-3 drops if pt needs moisture to swallow  effectively.   TODAY'S TREATMENT:  06/08/22: See "clinical swallow assessment" above. [name] last MBSS was December 2020.  SLP discussed swallow precautions (as above, and in "pt instructions") and need for modified (MBSS). Pt agrees with suggestion of MBSS. Barbara Krause, pt's aide was present to hear this and indicated she would assist pt in following these precautions. SLP to contact pt's referring MD/PCP and request order/script faxed to [phone]. Pt prefers this be completed at [facility] SLP explained that these precautions provided today may change after the swallow test at [facility]. Pt demonstrated understanding.   06/01/22: SLP explained rationale for having pt bring her own food for bedside/clinical swallow eval next session. SLP provided conversational sentences for pt to practice LOUD and CLEAR at home. She req'd occasional mod cues for breath support; occasional min-mod cues for overarticulation ("open your mouth") as pt's speech became ataxic intermittently. SLP explained how HEP for overartic should be completed. SLP modeled for Barbara Krause how to cue pt for overarticulation and for breath support. SLP used written/drawing cues for explaining talking at the top of the breath.   05/25/22 (eval): Discussed options for dysphagia level 3 and provided handout for diet items, reviewed  pt's last MBSS and educated pt and daughter re: additional swallows recommended.  SLP worked with pt to find compensation for speech clarity that was best for pt and believed at this time that overarticulation is most effective compensation.    PATIENT EDUCATION: Education details: see above in "today's treatment" Person educated: Patient and Caregiver Barbara Krause Education method: Explanation, Demonstration, Verbal cues, Handouts, and  modeling Education comprehension: verbalized understanding, returned demonstration, and needs further education     GOALS: Goals reviewed with patient? Yes  SHORT TERM GOALS: Target  date: 06/22/2022    Pt will complete PROM in first 2 therapy sessions Baseline: Goal status: Ongoing  2.  Pt will complete HEP for speech clarity compensations with rare min A in 2 sessions Baseline:  Goal status: Ongoing  3.  Pt will demo speech compensations functionally in 10 minutes simple conversation  Baseline:  Goal status: Ongoing  4.  Pt will participate in clinical swallow assessment with food from home, and participate in objective swallow eval if necessary Baseline:  Goal status: Met  5.  Pt will demo swallow precautions with food in 2 therapy sessions with modified independence Baseline:  Goal status: Ongoing   LONG TERM GOALS: Target date: 07/30/2022    Pt/caregiver will report that memory strategies pt engages in cont to improve pt's overall memory, between 3 sessions Baseline:  Goal status: Ongoing  2.  Pt will complete HEP for speech clarity compensations with modified independence/assistance from aide/s in 2 sessions Baseline:  Goal status: Ongoing  3.  Pt will demo speech compensations functionally in 10 minutes simple conversation over three sessions Baseline:  Goal status: Ongoing  4.  [name] score on PROM for communication will improve when compared to initial score Baseline:  Goal status: Ongoing   ASSESSMENT:  CLINICAL IMPRESSION: Patient is a 81 y.o. female who was seen today for treatment of speech clarity and for swallow safety. Today pt brought food from home for SLP to assess pt's swallowing clinically and objective swallow eval (MBSS) was recommended. Pt reports biggest concern at this time is incr frequency of people asking her to repeat- daughter states more on the phone than in person. Barbara Krause (aide) indicated today that she only rarely has difficulty understanding pt, and Veda said Barbara Krause (weekend aide) also does not have much difficulty understanding pt. Pt will cont to benefit from skilled ST.  OBJECTIVE IMPAIRMENTS include memory,  dysarthria, and dysphagia. These impairments are limiting patient from household responsibilities, ADLs/IADLs, effectively communicating at home and in community, and safety when swallowing. Factors affecting potential to achieve goals and functional outcome are co-morbidities and medical prognosis. Patient will benefit from skilled SLP services to address above impairments and improve overall function.  REHAB POTENTIAL: Good  PLAN: SLP FREQUENCY: 1x/week  SLP DURATION: 8 weeks  PLANNED INTERVENTIONS: Aspiration precaution training, Pharyngeal strengthening exercises, Diet toleration management , Environmental controls, Trials of upgraded texture/liquids, Internal/external aids, Oral motor tasks, and Functional tasks and memory compensations    [place], [place] 06/08/2022, 5:45 PM

## 2022-06-08 NOTE — Patient Instructions (Signed)
    WHEN EATING:  SMALL bites and SMALL sips  Double swallow for each bite or sip  Have food in your mouth, or have liquid in your mouth - don't mix the two together  If you absolutely have to have something to make the food more moist - two-three drops of liquid

## 2022-06-08 NOTE — Therapy (Signed)
OUTPATIENT PHYSICAL THERAPY NEURO TREATMENT   Patient Name: Barbara Krause MRN: [id number] DOB:Oct 31, 1941, 81 y.o., female Today's Date: 06/08/2022   PCP: [name], MD REFERRING PROVIDER: Pleas Koch, NP    PT End of Session - 06/08/22 1449     Visit Number 5    Number of Visits 10    Date for PT Re-Evaluation 07/01/22    Authorization Type Medicare/BCBS    Progress Note Due on Visit 10    PT Start Time 1448    PT Stop Time 1517    PT Time Calculation (min) 42 min    Equipment Utilized During Treatment Gait belt    Activity Tolerance Patient tolerated treatment well    Behavior During Therapy WFL for tasks assessed/performed                Past Medical History:  Diagnosis Date   Breast cancer ([place]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   Depression    History of chicken pox    History of colon polyps    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[place])    Kidney stone    Osteoporosis    Past Surgical History:  Procedure Laterality Date   CESAREAN SECTION     1977 and [place] Bilateral 2009   Patient Active Problem List   Diagnosis Date Noted   Acute bilateral low back pain without sciatica 03/22/2022   Recurrent falls 11/26/2020   Closed fracture dislocation of right elbow 11/24/2020   Moderate protein-calorie malnutrition ([place]) 06/30/2020   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   SAH (subarachnoid hemorrhage) ([place]) 06/15/2020   Aortic atherosclerosis ([place]) 08/30/2019   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[place]) 08/30/2019   Microscopic hematuria 04/02/2019   Chronic fatigue syndrome 04/02/2019   Osteoporosis 04/02/2019   Vitamin B12 deficiency 04/02/2019   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   HTN (hypertension) 03/05/2019   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   Tobacco use 03/05/2019   Depression, major, single episode, complete remission ([place]) 03/05/2019   Hx of breast cancer 03/05/2019   S/P bilateral mastectomy 03/05/2019    ONSET DATE: past  several years  REFERRING DIAG: G10 (ICD-10-CM) - 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   Pt accompanied by: caregiver, [name].  PERTINENT HISTORY: breast CA with B mastectomy 2009, depression, Huntington disease, osteoporosis  PAIN:  Are you having pain? No  PRECAUTIONS: Fall and Other: osteoporosis and compression deformities in T-spine   PATIENT GOALS improve strength  OBJECTIVE:  TODAY'S TREATMENT 06/08/22:   Activity Comments  Sit to stand working on mechanics to reduce trunk posterior leaning X 5 reps with cues of bend and half to reach back to sit down  Seated bilat shoulder horiz abduction  10 reps  Sit to stand reaching overhead for trunk elongation  X 5 reps  Standing wide BOS lateral weightshifting x 5 reps Cues for weightshifting and technique and targets to reach for  Compliant surface standing with ankles rolling in initially Provided cues for ankle/foot positioning and dec'd UE support, also alternated reaching with one UE support  Heel/toe raises standing X 10 reps  Cone tapping with bilat UEs, one UE and without with min A X 5 reps R and L alternating, cues for upright  Forward step ups to 6" step, BUE support, x 10 reps each leg Knee recurvatum noted RLE  Mini squats with postural tactile cues  X 5 reps  Side stepping wide<>middle X 3 reps each side for weight shifting  Gait with RW x 100 ft, 50 ft x 3 reps  Cues that helped improve mechanics were tall posture, and bilat heel strike with gait         Below measures were taken at time of initial evaluation unless otherwise specified:  DIAGNOSTIC FINDINGS:  03/22/22 lumbar xray: No acute findings. Multiple compression deformities are identified the within the lumbar spine and lower thoracic spine which appear unchanged when compared with CT of the chest from 02/18/2022.  COGNITION: Overall cognitive status: Impaired   SENSATION: WFL  COORDINATION: Alt pronation/supination: slightly dysmetric on L Finger to nose: intact B Heel to shin: intact B but limited trunk control  MUSCLE TONE: intact B   POSTURE: rounded shoulders, forward head, and increased thoracic kyphosis  LOWER EXTREMITY ROM:     Active  Right Eval Left Eval  Hip flexion    Hip extension    Hip abduction    Hip adduction    Hip internal rotation    Hip external rotation    Knee flexion    Knee extension    Ankle dorsiflexion 11 15  Ankle plantarflexion    Ankle inversion    Ankle eversion     (Blank rows = not tested)  LOWER EXTREMITY MMT:    MMT (in sitting) Right Eval Left Eval  Hip flexion 4 crepitus 4  Hip extension    Hip abduction 4 4  Hip adduction 4 4-  Hip internal rotation    Hip external rotation    Knee flexion 4+ 4+  Knee extension 4+ crepitus 4+ crepitus  Ankle dorsiflexion 4+ 4+  Ankle plantarflexion 4+ 4+  Ankle inversion    Ankle eversion    (Blank rows = not tested)  GAIT: Gait pattern:  R foot vaulting with forward flexed trunk and slow gait speed  Assistive device utilized: Environmental consultant - 2 wheeled Level of assistance: SBA   PATIENT EDUCATION: Education details: prognosis, POC, HEP Person educated: Patient and Clinical cytogeneticist) Education method: Explanation, Demonstration, Corporate treasurer cues, Verbal cues, and Handouts Education comprehension: verbalized understanding   HOME EXERCISE PROGRAM: Access Code: [email or web address]  URL:  [email or web address] Date: 05/20/2022 Prepared by: [facility] - Outpatient  Rehab - [facility]  Exercises - Standing Toe Taps  - 1 x daily - 5 x weekly - 2 sets - 20 reps - Alternating Step Backward with Support  - 1 x daily - 5 x weekly - 2 sets - 10 reps - Sit to Stand Without Arm Support  - 1 x daily - 5 x weekly - 2 sets - 10 reps - Standing Hip Abduction with Resistance at Ankles and Counter Support  - 1 x daily - 5 x weekly - 2 sets - 10 reps   GOALS: Goals reviewed with patient? Yes  SHORT TERM GOALS: Target date: 06/10/2022  Patient to be independent with initial HEP. Baseline: HEP initiated Goal status: MET    LONG TERM GOALS: Target date: 07/01/2022  Patient to be independent with advanced HEP. Baseline: Not yet initiated  Goal status: IN PROGRESS  Patient to demonstrate B LE strength >/=4+/5.  Baseline: See above Goal status: IN PROGRESS  Patient to demonstrate improved postural awareness and correction of posture at rest and with activity. Baseline: see above  Goal status: IN PROGRESS  Patient to complete TUG in <18 sec with LRAD in order to decrease risk of falls.   Baseline: 21 sec Goal status: IN PROGRESS  Patient to demonstrate 5xSTS test in <20 sec in order to decrease risk of falls.  Baseline: 26 sec Goal status: IN PROGRESS  Patient to score at least 43/56 on [place] in order to decrease risk of falls.  Baseline: 37/56 Goal status: IN PROGRESS   ASSESSMENT:  CLINICAL IMPRESSION:  The patient performed standing balance activities, cues during gait, weight shifting, and postural cues/strengthening.  She tolerated activities well and her caregiver notes they have a regular routine with current HEP. Will continue to progress as able and tolerated.     OBJECTIVE IMPAIRMENTS Abnormal gait, decreased activity tolerance, decreased balance, decreased coordination, difficulty walking, decreased strength, decreased safety awareness,  increased muscle spasms, impaired flexibility, improper body mechanics, and postural dysfunction.   ACTIVITY LIMITATIONS carrying, lifting, bending, standing, squatting, stairs, transfers, bathing, toileting, dressing, and reach over head  PARTICIPATION LIMITATIONS: meal prep, community activity, and church  PERSONAL FACTORS Age, Past/current experiences, Transportation, and 3+ comorbidities: breast CA with B mastectomy 2009, depression, Huntington disease, osteoporosis  are also affecting patient's functional outcome.   REHAB POTENTIAL: Good  CLINICAL DECISION MAKING: Evolving/moderate complexity  EVALUATION COMPLEXITY: Moderate  PLAN: PT FREQUENCY:  2 x/week for 3 weeks and 1x/week for 3 weeks  PT DURATION: other: 2 x/week for 3 weeks and 1x/week for 3 weeks  PLANNED INTERVENTIONS: Therapeutic exercises, Therapeutic activity, Neuromuscular re-education, Balance training, Gait training, Patient/Family education, Self Care, Joint mobilization, Stair training, Vestibular training, Canalith repositioning, Aquatic Therapy, Dry Needling, Cryotherapy, Moist heat, Taping, Manual therapy, and Re-evaluation  PLAN FOR NEXT SESSION: Continue to progress dynamic standing weight shifting, LE strengthening, address cues to help improve mechanics, and gait for home.   06/08/2022 Rudell Cobb, [facility] [place], [place], [place] [address] Phone # [phone] Fax # [phone]

## 2022-06-08 NOTE — Telephone Encounter (Signed)
Barbara Krause from [facility] called and stated that she get a modified barium swallow evaluation for [name]. She will like this done at [facility]. If a prescription can be faxed over for this to: [phone]. Thank you!

## 2022-06-09 NOTE — Addendum Note (Signed)
Addended by: [name] on: 06/09/2022 09:57 AM   Modules accepted: Orders

## 2022-06-09 NOTE — Telephone Encounter (Signed)
Ordered

## 2022-06-09 NOTE — Telephone Encounter (Signed)
Order faxed to neuro as requested.

## 2022-06-10 ENCOUNTER — Ambulatory Visit: Payer: Medicare Other

## 2022-06-10 DIAGNOSIS — R293 Abnormal posture: Secondary | ICD-10-CM

## 2022-06-10 DIAGNOSIS — R2689 Other abnormalities of gait and mobility: Secondary | ICD-10-CM

## 2022-06-10 DIAGNOSIS — R2681 Unsteadiness on feet: Secondary | ICD-10-CM | POA: Diagnosis not present

## 2022-06-10 DIAGNOSIS — R29818 Other symptoms and signs involving the nervous system: Secondary | ICD-10-CM

## 2022-06-10 DIAGNOSIS — M6281 Muscle weakness (generalized): Secondary | ICD-10-CM

## 2022-06-10 NOTE — Therapy (Signed)
OUTPATIENT PHYSICAL THERAPY NEURO TREATMENT   Patient Name: Barbara Krause MRN: [id number] DOB:November 03, 1940, 81 y.o., female Today's Date: 06/10/2022   PCP: [name], MD REFERRING PROVIDER: Pleas Koch, NP    PT End of Session - 06/10/22 1449     Visit Number 6    Number of Visits 10    Date for PT Re-Evaluation 07/01/22    Authorization Type Medicare/BCBS    Progress Note Due on Visit 10    Equipment Utilized During Treatment Gait belt    Activity Tolerance Patient tolerated treatment well    Behavior During Therapy [facility] for tasks assessed/performed                Past Medical History:  Diagnosis Date   Breast cancer ([place]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   Depression    History of chicken pox    History of colon polyps    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[place])    Kidney stone    Osteoporosis    Past Surgical History:  Procedure Laterality Date   CESAREAN SECTION     1977 and [place] Bilateral 2009   Patient Active Problem List   Diagnosis Date Noted   Acute bilateral low back pain without sciatica 03/22/2022   Recurrent falls 11/26/2020   Closed fracture dislocation of right elbow 11/24/2020   Moderate protein-calorie malnutrition ([place]) 06/30/2020   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   SAH (subarachnoid hemorrhage) ([place]) 06/15/2020   Aortic atherosclerosis ([place]) 08/30/2019   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[place]) 08/30/2019   Microscopic hematuria 04/02/2019   Chronic fatigue syndrome 04/02/2019   Osteoporosis 04/02/2019   Vitamin B12 deficiency 04/02/2019   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  Rationale for Evaluation and Treatment Rehabilitation  SUBJECTIVE:                                                                                                                                                                                              SUBJECTIVE STATEMENT: Been practicing activities from last session  Pt accompanied by: caregiver, [name].  PERTINENT HISTORY: breast CA with B mastectomy 2009, depression, Huntington disease, osteoporosis  PAIN:  Are you having pain? No  PRECAUTIONS: Fall and  Other: osteoporosis and compression deformities in T-spine   PATIENT GOALS improve strength  OBJECTIVE:    TODAY'S TREATMENT: 06/10/22 Activity Comments  Sit to stand transfer demonstration   Walk with med ball hand-off 2.2 lbs "Open/close"  Med ball chest pass Seated 1x10 Standing on firm 1x10 Standing on foam 1x10  Standing on foam eyes closed 5x10 sec, tendency for sudden anterior LOB  LAQ 3x10 5#   Alt stair taps x 2 min  5# ankle weights 6" box  Sidestepping x 2 min 5# ankle weights along countertop  Corner balance Feet together/apart EO/EC x 30 sec. Semi-tandem 1x30 sec            Below measures were taken at time of initial evaluation unless otherwise specified:  DIAGNOSTIC FINDINGS:  03/22/22 lumbar xray: No acute findings. Multiple compression deformities are identified the within the lumbar spine and lower thoracic spine which appear unchanged when compared with CT of the chest from 02/18/2022.  COGNITION: Overall cognitive status: Impaired   SENSATION: WFL  COORDINATION: Alt pronation/supination: slightly dysmetric on L Finger to nose: intact B Heel to shin: intact B but limited trunk control  MUSCLE TONE: intact B   POSTURE: rounded shoulders, forward head, and increased thoracic kyphosis  LOWER EXTREMITY ROM:     Active  Right Eval Left Eval  Hip flexion    Hip extension    Hip  abduction    Hip adduction    Hip internal rotation    Hip external rotation    Knee flexion    Knee extension    Ankle dorsiflexion 11 15  Ankle plantarflexion    Ankle inversion    Ankle eversion     (Blank rows = not tested)  LOWER EXTREMITY MMT:    MMT (in sitting) Right Eval Left Eval  Hip flexion 4 crepitus 4  Hip extension    Hip abduction 4 4  Hip adduction 4 4-  Hip internal rotation    Hip external rotation    Knee flexion 4+ 4+  Knee extension 4+ crepitus 4+ crepitus  Ankle dorsiflexion 4+ 4+  Ankle plantarflexion 4+ 4+  Ankle inversion    Ankle eversion    (Blank rows = not tested)  GAIT: Gait pattern:  R foot vaulting with forward flexed trunk and slow gait speed  Assistive device utilized: Environmental consultant - 2 wheeled Level of assistance: SBA   PATIENT EDUCATION: Education details: prognosis, POC, HEP Person educated: Patient and Clinical cytogeneticist) Education method: Explanation, Demonstration, Corporate treasurer cues, Verbal cues, and Handouts Education comprehension: verbalized understanding   HOME EXERCISE PROGRAM: Access Code: [email or web address] URL: [email or web address] Date: 05/20/2022 Prepared by: [facility] - Outpatient  Rehab - [facility]  Exercises - Standing Toe Taps  - 1 x daily - 5 x weekly - 2 sets - 20 reps - Alternating Step Backward with Support  - 1 x daily - 5 x weekly - 2 sets - 10 reps - Sit to Stand Without Arm Support  - 1 x daily - 5 x weekly - 2 sets - 10 reps - Standing Hip Abduction with Resistance at Ankles and Counter Support  - 1 x daily - 5 x weekly - 2 sets - 10 reps   GOALS: Goals reviewed with patient? Yes  SHORT TERM GOALS: Target date: 06/10/2022  Patient to be independent with initial HEP. Baseline: HEP initiated Goal status: MET    LONG TERM GOALS: Target date: 07/01/2022  Patient to be independent with advanced HEP. Baseline: Not  yet initiated  Goal status: IN PROGRESS  Patient to demonstrate B LE strength  >/=4+/5.  Baseline: See above Goal status: IN PROGRESS  Patient to demonstrate improved postural awareness and correction of posture at rest and with activity. Baseline: see above  Goal status: IN PROGRESS  Patient to complete TUG in <18 sec with LRAD in order to decrease risk of falls.   Baseline: 21 sec Goal status: IN PROGRESS  Patient to demonstrate 5xSTS test in <20 sec in order to decrease risk of falls.  Baseline: 26 sec Goal status: IN PROGRESS  Patient to score at least 43/56 on Berg in order to decrease risk of falls.  Baseline: 37/56 Goal status: IN PROGRESS   ASSESSMENT:  CLINICAL IMPRESSION: Treatment emphasis on improving coordination, LE strength, and balance to enhance balance and motor control with techniques to facilitate ankle strategy and improve proximal strengthening.  Pt and caregiver educated and trained in home balance activities to perform in corner for improved stability and safety. Continued sessions to progress LE strength, balance, and coordination to enhance gait and transfers to reduce risk for falls   OBJECTIVE IMPAIRMENTS Abnormal gait, decreased activity tolerance, decreased balance, decreased coordination, difficulty walking, decreased strength, decreased safety awareness, increased muscle spasms, impaired flexibility, improper body mechanics, and postural dysfunction.   ACTIVITY LIMITATIONS carrying, lifting, bending, standing, squatting, stairs, transfers, bathing, toileting, dressing, and reach over head  PARTICIPATION LIMITATIONS: meal prep, community activity, and church  PERSONAL FACTORS Age, Past/current experiences, Transportation, and 3+ comorbidities: breast CA with B mastectomy 2009, depression, Huntington disease, osteoporosis  are also affecting patient's functional outcome.   REHAB POTENTIAL: Good  CLINICAL DECISION MAKING: Evolving/moderate complexity  EVALUATION COMPLEXITY: Moderate  PLAN: PT FREQUENCY:  2 x/week for 3  weeks and 1x/week for 3 weeks  PT DURATION: other: 2 x/week for 3 weeks and 1x/week for 3 weeks  PLANNED INTERVENTIONS: Therapeutic exercises, Therapeutic activity, Neuromuscular re-education, Balance training, Gait training, Patient/Family education, Self Care, Joint mobilization, Stair training, Vestibular training, Canalith repositioning, Aquatic Therapy, Dry Needling, Cryotherapy, Moist heat, Taping, Manual therapy, and Re-evaluation  PLAN FOR NEXT SESSION: Continue to progress dynamic standing weight shifting, LE strengthening, address cues to help improve mechanics, and gait for home.   06/10/2022 [place] at [facility], [place] [place], [place] [address] Phone # [phone] Fax # [phone]

## 2022-06-14 NOTE — Therapy (Signed)
OUTPATIENT PHYSICAL THERAPY NEURO TREATMENT   Patient Name: Barbara Krause MRN: [id number] DOB:Jun 26, 1941, 81 y.o., female Today's Date: 06/15/2022   PCP: [name], MD REFERRING PROVIDER: Pleas Koch, NP    PT End of Session - 06/15/22 1529     Visit Number 7    Number of Visits 10    Date for PT Re-Evaluation 07/01/22    Authorization Type Medicare/BCBS    Progress Note Due on Visit 10    PT Start Time 4536    PT Stop Time 1528    PT Time Calculation (min) 39 min    Activity Tolerance Patient tolerated treatment well    Behavior During Therapy [facility] for tasks assessed/performed                 Past Medical History:  Diagnosis Date   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   Depression    History of chicken pox    History of colon polyps    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[place])    Kidney stone    Osteoporosis    Past Surgical History:  Procedure Laterality Date   CESAREAN SECTION     1977 and [place] Bilateral 2009   Patient Active Problem List   Diagnosis Date Noted   Acute bilateral low back pain without sciatica 03/22/2022   Recurrent falls 11/26/2020   Closed fracture dislocation of right elbow 11/24/2020   Moderate protein-calorie malnutrition ([place]) 06/30/2020   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   SAH (subarachnoid hemorrhage) ([place]) 06/15/2020   Aortic atherosclerosis ([place]) 08/30/2019   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[place]) 08/30/2019   Microscopic hematuria 04/02/2019   Chronic fatigue syndrome 04/02/2019   Osteoporosis 04/02/2019   Vitamin B12 deficiency 04/02/2019   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   HTN (hypertension) 03/05/2019   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   Tobacco use 03/05/2019   Depression, major, single episode, complete remission ([place]) 03/05/2019   Hx of breast cancer 03/05/2019   S/P bilateral mastectomy 03/05/2019    ONSET DATE: past several years  REFERRING DIAG: G10  (ICD-10-CM) - 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 Requesting to review HEP.  Pt accompanied by: caregiver, [name].  PERTINENT HISTORY: breast CA with B mastectomy 2009, depression, Huntington disease, osteoporosis  PAIN:  Are you having pain? No  PRECAUTIONS: Fall and Other: osteoporosis and compression deformities in T-spine   PATIENT GOALS improve strength  OBJECTIVE:     TODAY'S TREATMENT: 06/15/22 Activity Comments  Nustep L4 x 6 min Ues/Les  Consistent cueing required to maintain ~60 SPM  bridge 10x Core instability which improved with cueing   Bridge with red loop 10x   Alt bent knee fallout with red TB 10x C/o difficulty; trouble maintaining opposite LE still   deadbug with red pball on belly 10x Tendency to roll to 1 side d/t core instability   quadruped alt UE raise 5x each Increased sway to L side  Review of HEP: Romberg EC 30" Semi tandem 30" each LE Romberg + head turns 5x each Instructed patient and caregiver on HEP exercises in corner with walker in front and with hands hovering over walker for max safety   horizontal ABD TB red TB 10x  Demo and cueing for rhythmic breathing with exercise  B shoulder ER with red TB 10x Cues to maintain elbows in     PATIENT EDUCATION: Education details: review of HEP Person educated: Patient and Caregiver [name] Education method: Explanation, Demonstration, Tactile cues, Verbal cues, and Handouts Education  comprehension: verbalized understanding and returned demonstration    Below measures were taken at time of initial evaluation unless otherwise specified:  DIAGNOSTIC FINDINGS:  03/22/22 lumbar xray: No acute findings. Multiple compression deformities are identified the within the lumbar spine and lower thoracic spine which appear unchanged when compared with CT of the chest from 02/18/2022.  COGNITION: Overall cognitive status: Impaired   SENSATION: WFL  COORDINATION: Alt pronation/supination: slightly dysmetric on L Finger to nose: intact B Heel to shin: intact B but limited trunk control  MUSCLE TONE: intact B   POSTURE: rounded shoulders, forward head, and increased thoracic kyphosis  LOWER EXTREMITY ROM:     Active  Right Eval Left Eval  Hip flexion    Hip extension    Hip abduction    Hip adduction    Hip internal rotation    Hip external rotation    Knee flexion    Knee extension    Ankle dorsiflexion 11 15  Ankle plantarflexion    Ankle inversion    Ankle eversion     (Blank rows = not tested)  LOWER EXTREMITY MMT:    MMT (in sitting) Right Eval Left Eval  Hip flexion 4 crepitus 4  Hip extension    Hip abduction 4 4  Hip adduction 4 4-  Hip internal rotation    Hip external rotation    Knee flexion 4+ 4+  Knee extension 4+ crepitus 4+ crepitus  Ankle dorsiflexion 4+ 4+  Ankle plantarflexion 4+ 4+  Ankle inversion    Ankle eversion    (Blank rows = not tested)  GAIT: Gait pattern:  R foot vaulting with forward flexed trunk and slow gait speed  Assistive device utilized: Environmental consultant - 2 wheeled Level of assistance: SBA   PATIENT EDUCATION: Education details: prognosis, POC, HEP Person educated: Patient and Clinical cytogeneticist) Education method: Explanation, Demonstration, Corporate treasurer cues, Verbal cues, and Handouts Education comprehension: verbalized understanding   HOME EXERCISE PROGRAM: Access Code: [email or web address] URL:  [email or web address] Date: 05/20/2022 Prepared by: [facility] - Outpatient  Rehab -  [facility]  Exercises - Standing Toe Taps  - 1 x daily - 5 x weekly - 2 sets - 20 reps - Alternating Step Backward with Support  - 1 x daily - 5 x weekly - 2 sets - 10 reps - Sit to Stand Without Arm Support  - 1 x daily - 5 x weekly - 2 sets - 10 reps - Standing Hip Abduction with Resistance at Ankles and Counter Support  - 1 x daily - 5 x weekly - 2 sets - 10 reps   GOALS: Goals reviewed with patient? Yes  SHORT TERM GOALS: Target date: 06/10/2022  Patient to be independent with initial HEP. Baseline: HEP initiated Goal status: MET    LONG TERM GOALS: Target date: 07/01/2022  Patient to be independent with advanced HEP. Baseline: Not yet initiated  Goal status: IN PROGRESS  Patient to demonstrate B LE strength >/=4+/5.  Baseline: See above Goal status: IN PROGRESS  Patient to demonstrate improved postural awareness and correction of posture at rest and with activity. Baseline: see above  Goal status: IN PROGRESS  Patient to complete TUG in <18 sec with LRAD in order to decrease risk of falls.   Baseline: 21 sec Goal status: IN PROGRESS  Patient to demonstrate 5xSTS test in <20 sec in order to decrease risk of falls.  Baseline: 26 sec Goal status: IN PROGRESS  Patient to score at least 43/56 on Berg in order to decrease risk of falls.  Baseline: 37/56 Goal status: IN PROGRESS   ASSESSMENT:  CLINICAL IMPRESSION: Patient arrived to session with improved heel-toe pattern with ambulation and request to review HEP. This was done with caregiver and patient, with edu on appropriate safety and performance. Worked on core stability activities with intermittent cueing to activate core and reduce trunk/hip sway. Improved form after cueing and practice. Postural strengthening exercises required demonstration and cueing for rhythmic breathing throughout. Patient tolerated  session well and without complaints at end of session.    OBJECTIVE IMPAIRMENTS Abnormal gait, decreased activity tolerance, decreased balance, decreased coordination, difficulty walking, decreased strength, decreased safety awareness, increased muscle spasms, impaired flexibility, improper body mechanics, and postural dysfunction.   ACTIVITY LIMITATIONS carrying, lifting, bending, standing, squatting, stairs, transfers, bathing, toileting, dressing, and reach over head  PARTICIPATION LIMITATIONS: meal prep, community activity, and church  PERSONAL FACTORS Age, Past/current experiences, Transportation, and 3+ comorbidities: breast CA with B mastectomy 2009, depression, Huntington disease, osteoporosis  are also affecting patient's functional outcome.   REHAB POTENTIAL: Good  CLINICAL DECISION MAKING: Evolving/moderate complexity  EVALUATION COMPLEXITY: Moderate  PLAN: PT FREQUENCY:  2 x/week for 3 weeks and 1x/week for 3 weeks  PT DURATION: other: 2 x/week for 3 weeks and 1x/week for 3 weeks  PLANNED INTERVENTIONS: Therapeutic exercises, Therapeutic activity, Neuromuscular re-education, Balance training, Gait training, Patient/Family education, Self Care, Joint mobilization, Stair training, Vestibular training, Canalith repositioning, Aquatic Therapy, Dry Needling, Cryotherapy, Moist heat, Taping, Manual therapy, and Re-evaluation  PLAN FOR NEXT SESSION: Continue to progress dynamic standing weight shifting, LE strengthening, address cues to help improve mechanics, and gait for home.   Janene Harvey, PT, DPT 06/15/22 3:35 PM   Outpatient Rehab at [facility] [address] [place], [place], [place] [address] Phone # [phone] Fax # [phone]

## 2022-06-15 ENCOUNTER — Encounter: Payer: Self-pay | Admitting: Physical Therapy

## 2022-06-15 ENCOUNTER — Ambulatory Visit: Payer: Medicare Other | Admitting: Physical Therapy

## 2022-06-15 ENCOUNTER — Ambulatory Visit: Payer: Medicare Other

## 2022-06-15 DIAGNOSIS — M6281 Muscle weakness (generalized): Secondary | ICD-10-CM

## 2022-06-15 DIAGNOSIS — R29818 Other symptoms and signs involving the nervous system: Secondary | ICD-10-CM

## 2022-06-15 DIAGNOSIS — R2681 Unsteadiness on feet: Secondary | ICD-10-CM

## 2022-06-15 DIAGNOSIS — R471 Dysarthria and anarthria: Secondary | ICD-10-CM

## 2022-06-15 DIAGNOSIS — R41841 Cognitive communication deficit: Secondary | ICD-10-CM

## 2022-06-15 DIAGNOSIS — R2689 Other abnormalities of gait and mobility: Secondary | ICD-10-CM

## 2022-06-15 DIAGNOSIS — R1310 Dysphagia, unspecified: Secondary | ICD-10-CM

## 2022-06-15 DIAGNOSIS — R293 Abnormal posture: Secondary | ICD-10-CM

## 2022-06-15 NOTE — Therapy (Signed)
OUTPATIENT SPEECH LANGUAGE PATHOLOGY TREATMENT NOTE   Patient Name: Barbara Krause MRN: [id number] DOB:[birth year], 81 y.o., female Today's Date: 06/15/2022  PCP: Waunita Schooner, MD REFERRING PROVIDER: Pleas Koch, NP    End of Session - 06/15/22 1542     Visit Number 4    Number of Visits 9    Date for SLP Re-Evaluation 07/30/22    SLP Start Time 66    SLP Stop Time  7588    SLP Time Calculation (min) 40 min    Activity Tolerance Patient tolerated treatment well               Past Medical History:  Diagnosis Date   Breast cancer ([place]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   Depression    History of chicken pox    History of colon polyps    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[place])    Kidney stone    Osteoporosis    Past Surgical History:  Procedure Laterality Date   CESAREAN SECTION     1977 and [place] Bilateral 2009   Patient Active Problem List   Diagnosis Date Noted   Acute bilateral low back pain without sciatica 03/22/2022   Recurrent falls 11/26/2020   Closed fracture dislocation of right elbow 11/24/2020   Moderate protein-calorie malnutrition ([place]) 06/30/2020   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   SAH (subarachnoid hemorrhage) ([place]) 06/15/2020   Aortic atherosclerosis ([place]) 08/30/2019   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[place]) 08/30/2019   Microscopic hematuria 04/02/2019   Chronic fatigue syndrome 04/02/2019   Osteoporosis 04/02/2019   Vitamin B12 deficiency 04/02/2019   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 No  PATIENT GOALS  Improve speech clarity  OBJECTIVE:   DIAGNOSTIC FINDINGS:  MODIFIED BARIUM SWALLOW 10-23-19 ASSESSMENT: Pt appears to present w/ slight-mild oral phase dysphagia; almost a min lack of awareness of oral residue during bolus management of food trials. During trials of increased texture/solids, pt exhibited min bolus piecemealing w/ foods; inconsistent oral (diffuse) residue noted almost as if unaware of the residue. Using the strategy (taught by SLP) of lingual sweep w/ a Dry, f/u swallow, pt cleared oral cavity completely. This was utilized intermittently as instructed by SLP. During the pharyngeal phase, Pt exhibited timely pharyngeal swallow initiation w/ No laryngeal penetration or aspiration noted; adequate and timely airway closure. No signifcant pharyngeal residue remained post swallows indicating adequate laryngeal excursion and pharyngeal pressure during the swallows. During the Esophageal phase, slower Esophageal clearing noted; bolus stasis occurred x1 w/ trial of graham cracker(moistened w/ puree). ANY Esophageal phase dysmotility can result in Reflux activity and include overt s/s including the "Dry" cough, or Reflux cough. Recommend monitoring for such s/s during/post meals.  PLAN/RECOMMENDATIONS:             A. Diet: more of a Mech Soft diet consistency w/ Meats CUT well and moistened well; Thin liquids. Pt was taught using a Puree for swallowing Pills if needed for ease of swallowing/clearing             B. Swallowing Precautions: general aspiration precautions including f/u, Dry swallow to  clear any remaining oral residue.              C. Recommended consultation to: f/u w/ GI for assessment of Esophageal phase motility; further education and management             D. Therapy recommendations: None at this time. Recommended pt monitor for any s/s of aspiration Esophageal phase dysmotility and overt coughing w/ oral intake writing it down in a Food Journal to increase awareness of any similarities or issues w/ particular foods/times             E. Results and recommendations were discussed w/ patient; video viewed; questions answered; precautions/strategies discussed and practiced Dysphagia, pharyngoesophageal phase  PT DOES *NOT* DESIRE NON-ORAL FEEDING OPTIONS.    PATIENT REPORTED OUTCOME MEASURES (PROM):   Pt returned the Speech QOL short form and scored 12/60 (lower numbers suggest better QOL).  Pt rated "I had trouble speaking" 3/5 (sometimes), and "I was frustrated by my speech difficulties" a 2/5 (rarely), and "I have ________ saying what I want to say" a 2/5 (a little difficulty).    TODAY'S TREATMENT:  06/15/22: See pt's therapy note in "chart review" for clinical swallow evaluation last session. [name] endorses coughing average <1 time/dinner, but only consumes one  meal/day - dinner. She has 3 smaller-size muffins with coffee for breakfast, a Boost at 1pm and one at 3pm, and then her dinner. She denies coughing with breakfast or with Boosts during the day.  It was ascertained by SLP today that [name] does not do much talking during the week. One short conversation with her sister/week, and average once/week conversation with each of her daughters. [name] said she and [name] talk mostly when they watch TV together for 30-60 minutes Mon-Fri, and [name] agreed with pt that she does not talk much at home. SLP asked pt if she wanted to cont to target talking and pt stated she would like to cont. SLP reminded pt that she may find it more challenging to verbally communicate and it would be good  to practice overarticulation now so it's easier later. Pt also, when questioned by SLP, denied there were social or speaking situations she would participate more in if she could speak clearer. SLP is unsure of feasibility of speech compensation carryover to outside ST room due to pt's limited talking time. Pt spoke with incr'd articulatory movements when reading conversational sentences and when reading 8-9 word sentences however intelligibility dipped from 90% to 70% due to longer utterance length. SLP educated pt and [name] that pt may find it beneficial to make verbal output "short and sweet" as time progresses because longer verbal utterances have greater possibility of being misunderstood. [name] agreed. SLP may do more therapy for intelligiblity via phone as that is more difficult situation for pt, according to daughter, and a mode pt frequently uses to communicate with others at this time.  06/08/22:  Pt did not follow precautions from previous MBSS three years ago. Suspect pt's memory deficit negatively impacted her ability to do this.  SLP recommended precautions delineated in "Pt instructions":  small bites/sips, double swallow, swallow solids prior to taking sip liquid - exception is to take 2-3 drops if pt needs moisture to swallow effectively.  [name] last MBSS was December 2020.  SLP discussed swallow precautions (as above, and in "pt instructions") and need for modified (MBSS). Pt agrees with suggestion of MBSS. [name], pt's aide was present to hear this and indicated she would assist pt in following these precautions. SLP to contact pt's referring MD/PCP and request order/script faxed to [phone]. Pt prefers this be completed at [facility] SLP explained that these precautions provided today may change after the swallow test at [facility]. Pt demonstrated understanding.   06/01/22: SLP explained rationale for having pt bring her own food for bedside/clinical swallow eval next session. SLP  provided conversational sentences for pt to practice LOUD and CLEAR at home. She req'd occasional mod cues for breath support; occasional min-mod cues for overarticulation ("open your mouth") as pt's speech became ataxic intermittently. SLP explained how HEP for overartic should be completed. SLP modeled for [name] how to cue pt for overarticulation and for breath support. SLP used written/drawing cues for explaining talking at the top of the breath.   05/25/22 (eval): Discussed options for dysphagia level 3 and provided handout for diet items, reviewed pt's last MBSS and educated pt and daughter re: additional swallows recommended.  SLP worked with pt to find compensation for speech clarity that was best for pt and believed at this time that overarticulation is most effective compensation.    PATIENT EDUCATION: Education details: see above in "today's treatment" Person educated: Patient and Caregiver [name] Education method: Explanation, Demonstration, Verbal cues, Handouts, and  modeling Education comprehension: verbalized understanding, returned demonstration, and needs further education     GOALS: Goals reviewed with patient? Yes  SHORT TERM GOALS: Target date: 06/22/2022    Pt will complete PROM in first 2 therapy sessions Baseline: Goal status: Met  2.  Pt will complete HEP for speech clarity compensations with rare min A in 2 sessions  Baseline: 06-15-22 Goal status: Ongoing  3.  Pt will demo speech compensations functionally in 10 minutes simple conversation  Baseline:  Goal status: Ongoing  4.  Pt will participate in clinical swallow assessment with food from home, and participate in objective swallow eval if necessary Baseline:  Goal status: Met  5.  Pt will demo swallow precautions with food in 2 therapy sessions with modified independence, after MBSS Baseline:  Goal status: Revised   LONG TERM GOALS: Target date: 07/30/2022    Pt/caregiver will report that memory  strategies pt engages in cont to improve pt's overall memory, between 3 sessions Baseline:  Goal status: Ongoing  2.  Pt will complete HEP for speech clarity compensations with modified independence/assistance from aide/s in 2 sessions Baseline:  Goal status: Ongoing  3.  Pt will demo speech compensations functionally in 10 minutes simple conversation over three sessions Baseline:  Goal status: Ongoing  4.  [name] score on PROM for communication will improve when compared to initial score Baseline:  Goal status: Ongoing   ASSESSMENT:  CLINICAL IMPRESSION: Patient is a 81 y.o. female who was seen today for treatment of speech clarity and for swallow safety. Pt reported on eval date her biggest concern at this time is incr frequency of people asking her to repeat- daughter states more on the phone than in person; SLP may shift intelligibility practice to do more on the phone due to this. [name] cont to indicate today  that she only rarely has difficulty understanding pt, and Idabelle said [place] (weekend aide) also does not have much difficulty understanding pt. Pt will cont to benefit from skilled ST.  OBJECTIVE IMPAIRMENTS include memory, dysarthria, and dysphagia. These impairments are limiting patient from household responsibilities, ADLs/IADLs, effectively communicating at home and in community, and safety when swallowing. Factors affecting potential to achieve goals and functional outcome are co-morbidities and medical prognosis. Patient will benefit from skilled SLP services to address above impairments and improve overall function.  REHAB POTENTIAL: Good  PLAN: SLP FREQUENCY: 1x/week  SLP DURATION: 8 weeks  PLANNED INTERVENTIONS: Aspiration precaution training, Pharyngeal strengthening exercises, Diet toleration management , Environmental controls, Trials of upgraded texture/liquids, Internal/external aids, Oral motor tasks, and Functional tasks and memory  compensations    [place], [place] 06/15/2022, 3:42 PM

## 2022-06-16 NOTE — Therapy (Signed)
OUTPATIENT PHYSICAL THERAPY NEURO TREATMENT   Patient Name: Barbara Krause MRN: [id number] DOB:1941/07/31, 81 y.o., female Today's Date: 06/17/2022   PCP: [name], MD REFERRING PROVIDER: Pleas Koch, NP    PT End of Session - 06/17/22 1622     Visit Number 8    Number of Visits 10    Date for PT Re-Evaluation 07/01/22    Authorization Type Medicare/BCBS    Progress Note Due on Visit 10    PT Start Time 5015    PT Stop Time 8682    PT Time Calculation (min) 38 min    Equipment Utilized During Treatment Gait belt    Activity Tolerance Patient tolerated treatment well    Behavior During Therapy WFL for tasks assessed/performed                  Past Medical History:  Diagnosis Date   Breast cancer ([place]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   Depression    History of chicken pox    History of colon polyps    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 HEP is going well- "we do them everyday."   Pt accompanied by: caregiver, [name].  PERTINENT HISTORY: breast CA with B mastectomy 2009, depression, Huntington disease, osteoporosis  PAIN:  Are you having pain? No  PRECAUTIONS: Fall and Other: osteoporosis and compression deformities in T-spine   PATIENT GOALS improve strength  OBJECTIVE:      TODAY'S TREATMENT: 06/17/22 Activity Comments  Nustep L4 x 6 min Ues/Les  Consistent cueing required to maintain ~60 SPM  quadruped alt UE raise 10x Cues to contract core and maintain hips stable  quadruped alt LE raise 10x Cues to contract core and maintain hips stable as well and R elbow straight   tall kneeling hip hinge with chair 5x, 5x with green TB Good tolerance and form; cues to look in mirror for taller posture   Gait training without AD; figure 8 turns and stepping over hurdle, pivot turns  Path deviation and slow discontinuous stepping with turns   grapevine in II bars with B UE support 6x Started with verbal cues, then focused on upright posture       Below measures were taken at time of initial evaluation unless otherwise specified:  DIAGNOSTIC FINDINGS:  03/22/22 lumbar xray: No acute findings. Multiple compression deformities are identified the within the lumbar spine and lower thoracic spine which appear unchanged when compared with CT of the chest from  02/18/2022.  COGNITION: Overall cognitive status: Impaired   SENSATION: WFL  COORDINATION: Alt pronation/supination: slightly dysmetric on L Finger to nose: intact B Heel to shin: intact B but limited trunk control  MUSCLE TONE: intact B   POSTURE: rounded shoulders, forward head, and increased thoracic kyphosis  LOWER EXTREMITY ROM:     Active  Right Eval Left Eval  Hip flexion    Hip extension    Hip abduction    Hip adduction    Hip internal rotation    Hip external rotation    Knee flexion    Knee extension    Ankle dorsiflexion 11 15  Ankle plantarflexion    Ankle inversion    Ankle eversion     (Blank rows = not tested)  LOWER EXTREMITY MMT:    MMT (in sitting) Right Eval Left Eval  Hip flexion 4 crepitus 4  Hip extension    Hip abduction 4 4  Hip adduction 4 4-  Hip internal rotation    Hip external rotation    Knee flexion 4+ 4+  Knee extension 4+ crepitus 4+ crepitus  Ankle dorsiflexion 4+ 4+  Ankle plantarflexion 4+ 4+  Ankle inversion    Ankle eversion    (Blank rows = not tested)  GAIT: Gait pattern:  R foot vaulting with forward flexed trunk and slow gait speed  Assistive device utilized: Environmental consultant - 2 wheeled Level of assistance: SBA   PATIENT EDUCATION: Education details: prognosis, POC, HEP Person educated: Patient and Clinical cytogeneticist) Education method: Explanation, Demonstration, Corporate treasurer cues, Verbal cues, and Handouts Education comprehension: verbalized understanding   HOME EXERCISE PROGRAM: Access Code: [email or web address] URL: [email or web address] Date: 05/20/2022 Prepared by: [facility]  Exercises - Standing Toe Taps  - 1 x daily - 5 x weekly - 2 sets - 20 reps - Alternating Step Backward with Support  - 1 x daily - 5 x weekly - 2 sets - 10 reps - Sit to Stand Without Arm Support  - 1 x daily - 5 x weekly - 2 sets - 10  reps - Standing Hip Abduction with Resistance at Ankles and Counter  Support  - 1 x daily - 5 x weekly - 2 sets - 10 reps   GOALS: Goals reviewed with patient? Yes  SHORT TERM GOALS: Target date: 06/10/2022  Patient to be independent with initial HEP. Baseline: HEP initiated Goal status: MET    LONG TERM GOALS: Target date: 07/01/2022  Patient to be independent with advanced HEP. Baseline: Not yet initiated  Goal status: IN PROGRESS  Patient to demonstrate B LE strength >/=4+/5.  Baseline: See above Goal status: IN PROGRESS  Patient to demonstrate improved postural awareness and correction of posture at rest and with activity. Baseline: see above  Goal status: IN PROGRESS  Patient to complete TUG in <18 sec with LRAD in order to decrease risk of falls.   Baseline: 21 sec Goal status: IN PROGRESS  Patient to demonstrate 5xSTS test in <20 sec in order to decrease risk of falls.  Baseline: 26 sec Goal status: IN PROGRESS  Patient to score at least 43/56 on [place] in order to decrease risk of falls.  Baseline: 37/56 Goal status: IN PROGRESS   ASSESSMENT:  CLINICAL IMPRESSION: Patient arrived to session without complaints. Worked in quadruped for core stability and balance. Good form demonstrated with tall kneeling activities. Worked on balance and coordination activities with patient benefitting from verbal cueing for improved stepping sequence. Initiated gait training without AD- patient admits to ambulating without AD inside the home. Turning activities were particularly unsteady and slow, thus suggested trialing [facility] which patient refused despite explanation on its benefits. Tolerated session well and without complaints at end of session.   OBJECTIVE IMPAIRMENTS Abnormal gait, decreased activity tolerance, decreased balance, decreased coordination, difficulty walking, decreased strength, decreased safety awareness, increased muscle spasms, impaired flexibility, improper body mechanics, and postural dysfunction.   ACTIVITY LIMITATIONS  carrying, lifting, bending, standing, squatting, stairs, transfers, bathing, toileting, dressing, and reach over head  PARTICIPATION LIMITATIONS: meal prep, community activity, and church  PERSONAL FACTORS Age, Past/current experiences, Transportation, and 3+ comorbidities: breast CA with B mastectomy 2009, depression, Huntington disease, osteoporosis  are also affecting patient's functional outcome.   REHAB POTENTIAL: Good  CLINICAL DECISION MAKING: Evolving/moderate complexity  EVALUATION COMPLEXITY: Moderate  PLAN: PT FREQUENCY:  2 x/week for 3 weeks and 1x/week for 3 weeks  PT DURATION: other: 2 x/week for 3 weeks and 1x/week for 3 weeks  PLANNED INTERVENTIONS: Therapeutic exercises, Therapeutic activity, Neuromuscular re-education, Balance training, Gait training, Patient/Family education, Self Care, Joint mobilization, Stair training, Vestibular training, Canalith repositioning, Aquatic Therapy, Dry Needling, Cryotherapy, Moist heat, Taping, Manual therapy, and Re-evaluation  PLAN FOR NEXT SESSION: Continue to progress dynamic standing weight shifting, LE strengthening, address cues to help improve mechanics, and gait for home.   Janene Harvey, PT, DPT 06/17/22 4:23 PM  [facility] [address], [place], [place] [address] Phone # [phone] Fax # [phone]

## 2022-06-17 ENCOUNTER — Ambulatory Visit: Payer: Medicare Other | Admitting: Physical Therapy

## 2022-06-17 ENCOUNTER — Encounter: Payer: Self-pay | Admitting: Physical Therapy

## 2022-06-17 DIAGNOSIS — R293 Abnormal posture: Secondary | ICD-10-CM

## 2022-06-17 DIAGNOSIS — R2681 Unsteadiness on feet: Secondary | ICD-10-CM | POA: Diagnosis not present

## 2022-06-17 DIAGNOSIS — M6281 Muscle weakness (generalized): Secondary | ICD-10-CM

## 2022-06-17 DIAGNOSIS — R29818 Other symptoms and signs involving the nervous system: Secondary | ICD-10-CM

## 2022-06-17 DIAGNOSIS — R2689 Other abnormalities of gait and mobility: Secondary | ICD-10-CM

## 2022-06-21 NOTE — Therapy (Signed)
OUTPATIENT PHYSICAL THERAPY NEURO TREATMENT   Patient Name: Barbara Krause MRN: [id number] DOB:Jan 08, 1941, 81 y.o., female Today's Date: 06/22/2022   PCP: [name], MD REFERRING PROVIDER: Pleas Koch, NP    PT End of Session - 06/22/22 1536     Visit Number 9    Number of Visits 10    Date for PT Re-Evaluation 07/01/22    Authorization Type Medicare/BCBS    Progress Note Due on Visit 10    PT Start Time 1446    PT Stop Time 1529    PT Time Calculation (min) 43 min    Equipment Utilized During Treatment Gait belt    Activity Tolerance Patient tolerated treatment well    Behavior During Therapy WFL for tasks assessed/performed                   Past Medical History:  Diagnosis Date   Breast cancer ([place]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   Depression    History of chicken pox    History of colon polyps    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[place])    Kidney stone    Osteoporosis    Past Surgical History:  Procedure Laterality Date   CESAREAN SECTION     1977 and [place] Bilateral 2009   Patient Active Problem List   Diagnosis Date Noted   Acute bilateral low back pain without sciatica 03/22/2022   Recurrent falls 11/26/2020   Closed fracture dislocation of right elbow 11/24/2020   Moderate protein-calorie malnutrition ([place]) 06/30/2020   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   SAH (subarachnoid hemorrhage) ([place]) 06/15/2020   Aortic atherosclerosis ([place]) 08/30/2019   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[place]) 08/30/2019   Microscopic hematuria 04/02/2019   Chronic fatigue syndrome 04/02/2019   Osteoporosis 04/02/2019   Vitamin B12 deficiency 04/02/2019   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   HTN (hypertension) 03/05/2019   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   Tobacco use 03/05/2019   Depression, major, single episode, complete remission ([place]) 03/05/2019   Hx of breast cancer 03/05/2019   S/P bilateral mastectomy 03/05/2019    ONSET  DATE: past several years  REFERRING DIAG: G10 (ICD-10-CM) - 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   Pt accompanied by: caregiver, [name].  PERTINENT HISTORY: breast CA with B mastectomy 2009, depression, Huntington disease, osteoporosis  PAIN:  Are you having pain? No  PRECAUTIONS: Fall and Other: osteoporosis and compression deformities in T-spine   PATIENT GOALS improve strength  OBJECTIVE:     TODAY'S TREATMENT: 06/22/22 Activity Comments  Nustep L5 x 6 min Ues/Les  Cues to maintain ~50 SPM  R/L step ups on 6" step 15x Min A and 1 UE support required on R LE d/t weakness    Sitting forward reach 5x To elicit improved trunk lean with transfers   R LAQ with red TB 2x10 Cues for rhythmic breathing to avoid holding breath- pt difficulty coordinating   STS with R foot back 10x Min A and cues to increase anterior trunk lean, scoot forward, wider BOS; last 5 reps from elevated seat d/t difficulty   toe tap to 3 cones without UE support  Considerable sway but with only occasional LOB    HOME EXERCISE PROGRAM Last updated: 06/22/22 Access Code: [email or web address] URL: [email or web address] Date: 06/22/2022 Prepared by: [facility]  Exercises - Standing Toe Taps  - 1 x daily - 5 x weekly - 2 sets - 20 reps - Alternating Step Backward with Support  - 1 x daily - 5 x weekly - 2 sets - 10 reps - Sit to Stand Without Arm Support  - 1 x daily - 5 x weekly - 2 sets - 10 reps - Standing Hip Abduction with Resistance at Ankles and  Counter Support  - 1 x daily - 5 x weekly - 2 sets - 10 reps - Sitting Knee Extension with Resistance  - 1 x daily - 5 x weekly - 2 sets - 10 reps  PATIENT EDUCATION: Education details: review and update of HEP Person educated: Patient and Caregiver [name] Education method: Explanation, Demonstration, Tactile cues, Verbal cues, and Handouts Education comprehension: verbalized understanding and returned demonstration    Below measures were taken at time of initial evaluation unless otherwise specified:  DIAGNOSTIC FINDINGS:  03/22/22 lumbar xray: No acute findings. Multiple compression deformities are identified the within the lumbar spine and lower thoracic spine which appear unchanged when compared with CT of the chest from 02/18/2022.  COGNITION: Overall cognitive status: Impaired   SENSATION: WFL  COORDINATION: Alt pronation/supination: slightly dysmetric on L Finger to nose: intact B Heel to shin: intact B but limited trunk control  MUSCLE TONE: intact B   POSTURE: rounded shoulders, forward head, and increased thoracic kyphosis  LOWER EXTREMITY ROM:     Active  Right Eval Left Eval  Hip flexion    Hip extension    Hip abduction    Hip adduction    Hip internal rotation    Hip external rotation    Knee flexion    Knee extension    Ankle dorsiflexion 11 15  Ankle plantarflexion    Ankle inversion    Ankle eversion     (Blank rows = not tested)  LOWER EXTREMITY MMT:    MMT (in sitting) Right Eval Left Eval  Hip flexion 4 crepitus 4  Hip extension    Hip abduction 4 4  Hip adduction 4 4-  Hip internal rotation    Hip external rotation    Knee flexion 4+ 4+  Knee extension 4+ crepitus 4+ crepitus  Ankle dorsiflexion 4+ 4+  Ankle plantarflexion 4+ 4+  Ankle  inversion    Ankle eversion    (Blank rows = not tested)  GAIT: Gait pattern:  R foot vaulting with forward flexed trunk and slow gait speed  Assistive device utilized: Walker - 2 wheeled Level  of assistance: SBA   PATIENT EDUCATION: Education details: prognosis, POC, HEP Person educated: Patient and Child(ren) Education method: Explanation, Demonstration, Tactile cues, Verbal cues, and Handouts Education comprehension: verbalized understanding   HOME EXERCISE PROGRAM: Access Code: [email or web address] URL: [email or web address] Date: 05/20/2022 Prepared by: [facility]  Exercises - Standing Toe Taps  - 1 x daily - 5 x weekly - 2 sets - 20 reps - Alternating Step Backward with Support  - 1 x daily - 5 x weekly - 2 sets - 10 reps - Sit to Stand Without Arm Support  - 1 x daily - 5 x weekly - 2 sets - 10 reps - Standing Hip Abduction with Resistance at Ankles and Counter Support  - 1 x daily - 5 x weekly - 2 sets - 10 reps   GOALS: Goals reviewed with patient? Yes  SHORT TERM GOALS: Target date: 06/10/2022  Patient to be independent with initial HEP. Baseline: HEP initiated Goal status: MET    LONG TERM GOALS: Target date: 07/01/2022  Patient to be independent with advanced HEP. Baseline: Not yet initiated  Goal status: IN PROGRESS  Patient to demonstrate B LE strength >/=4+/5.  Baseline: See above Goal status: IN PROGRESS  Patient to demonstrate improved postural awareness and correction of posture at rest and with activity. Baseline: see above  Goal status: IN PROGRESS  Patient to complete TUG in <18 sec with LRAD in order to decrease risk of falls.   Baseline: 21 sec Goal status: IN PROGRESS  Patient to demonstrate 5xSTS test in <20 sec in order to decrease risk of falls.  Baseline: 26 sec Goal status: IN PROGRESS  Patient to score at least 43/56 on Berg in order to decrease risk of falls.  Baseline: 37/56 Goal status: IN PROGRESS   ASSESSMENT:  CLINICAL IMPRESSION: Patient arrived to session without complaints. Worked on step ups- initially without UE support which was very unstable. Better control with  single handrail support, however with occasional R knee buckling and limited control on R>L side. Proceeded with quad strengthening to improve success with stairs. Patient with difficulty performing STS with R LE bias, requiring min A and cues throughout. Did demonstrate good stability with challenging SLS activities. Patient tolerated session without complaints.    OBJECTIVE IMPAIRMENTS Abnormal gait, decreased activity tolerance, decreased balance, decreased coordination, difficulty walking, decreased strength, decreased safety awareness, increased muscle spasms, impaired flexibility, improper body mechanics, and postural dysfunction.   ACTIVITY LIMITATIONS carrying, lifting, bending, standing, squatting, stairs, transfers, bathing, toileting, dressing, and reach over head  PARTICIPATION LIMITATIONS: meal prep, community activity, and church  PERSONAL FACTORS Age, Past/current experiences, Transportation, and 3+ comorbidities: breast CA with B mastectomy 2009, depression, Huntington disease, osteoporosis  are also affecting patient's functional outcome.   REHAB POTENTIAL: Good  CLINICAL DECISION MAKING: Evolving/moderate complexity  EVALUATION COMPLEXITY: Moderate  PLAN: PT FREQUENCY:  2 x/week for 3 weeks and 1x/week for 3 weeks  PT DURATION: other: 2 x/week for 3 weeks and 1x/week for 3 weeks  PLANNED INTERVENTIONS: Therapeutic exercises, Therapeutic activity, Neuromuscular re-education, Balance training, Gait training, Patient/Family education, Self Care, Joint mobilization, Stair training, Vestibular training, Canalith repositioning, Aquatic Therapy, Dry Needling, Cryotherapy, Moist heat, Taping, Manual therapy, and Re-evaluation  PLAN FOR NEXT SESSION: Continue to progress dynamic standing weight shifting, LE strengthening, address cues to help improve mechanics, and gait for home.   Janene Harvey, PT, DPT 06/22/22 3:37 PM  [facility] at [facility] [address] [place], [place], [place] [address] Phone # [phone] Fax # [phone]

## 2022-06-22 ENCOUNTER — Ambulatory Visit: Payer: Medicare Other | Admitting: Physical Therapy

## 2022-06-22 ENCOUNTER — Encounter: Payer: Self-pay | Admitting: Physical Therapy

## 2022-06-22 ENCOUNTER — Ambulatory Visit: Payer: Medicare Other

## 2022-06-22 DIAGNOSIS — R2681 Unsteadiness on feet: Secondary | ICD-10-CM

## 2022-06-22 DIAGNOSIS — R2689 Other abnormalities of gait and mobility: Secondary | ICD-10-CM

## 2022-06-22 DIAGNOSIS — M6281 Muscle weakness (generalized): Secondary | ICD-10-CM

## 2022-06-22 DIAGNOSIS — R41841 Cognitive communication deficit: Secondary | ICD-10-CM

## 2022-06-22 DIAGNOSIS — R1310 Dysphagia, unspecified: Secondary | ICD-10-CM

## 2022-06-22 DIAGNOSIS — R471 Dysarthria and anarthria: Secondary | ICD-10-CM

## 2022-06-22 NOTE — Therapy (Signed)
OUTPATIENT SPEECH LANGUAGE PATHOLOGY TREATMENT NOTE   Patient Name: Barbara Krause MRN: [id number] DOB:[birth year], 81 y.o., female Today's Date: 06/22/2022  PCP: Waunita Schooner, MD REFERRING PROVIDER: Pleas Koch, NP    End of Session - 06/22/22 1615     Visit Number 5    Number of Visits 9    Date for SLP Re-Evaluation 07/30/22    SLP Start Time 5374    SLP Stop Time  1608    SLP Time Calculation (min) 36 min    Activity Tolerance Patient tolerated treatment well                Past Medical History:  Diagnosis Date   Breast cancer ([place]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   Depression    History of chicken pox    History of colon polyps    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[place])    Kidney stone    Osteoporosis    Past Surgical History:  Procedure Laterality Date   CESAREAN SECTION     1977 and [place] Bilateral 2009   Patient Active Problem List   Diagnosis Date Noted   Acute bilateral low back pain without sciatica 03/22/2022   Recurrent falls 11/26/2020   Closed fracture dislocation of right elbow 11/24/2020   Moderate protein-calorie malnutrition ([place]) 06/30/2020   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   SAH (subarachnoid hemorrhage) ([place]) 06/15/2020   Aortic atherosclerosis ([place]) 08/30/2019   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[place]) 08/30/2019   Microscopic hematuria 04/02/2019   Chronic fatigue syndrome 04/02/2019   Osteoporosis 04/02/2019   Vitamin B12 deficiency 04/02/2019   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 [name] has had no difficulty understanding pt since last session. 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 No  PATIENT GOALS  Improve speech clarity  OBJECTIVE:   DIAGNOSTIC FINDINGS:  MODIFIED BARIUM SWALLOW 10-23-19 ASSESSMENT: Pt appears to present w/ slight-mild oral phase dysphagia; almost a min lack of awareness of oral residue during bolus management of food trials. During trials of increased texture/solids, pt exhibited min bolus piecemealing w/ foods; inconsistent oral (diffuse) residue noted almost as if unaware of the residue. Using the strategy (taught by SLP) of lingual sweep w/ a Dry, f/u swallow, pt cleared oral cavity completely. This was utilized intermittently as instructed by SLP. During the pharyngeal phase, Pt exhibited timely pharyngeal swallow initiation w/ No laryngeal penetration or aspiration noted; adequate and timely airway closure. No signifcant pharyngeal residue remained post swallows indicating adequate laryngeal excursion and pharyngeal pressure during the swallows. During the Esophageal phase, slower Esophageal clearing noted; bolus stasis occurred x1 w/ trial of graham cracker(moistened w/ puree). ANY Esophageal phase dysmotility can result in Reflux activity and include overt s/s including the "Dry" cough, or Reflux  cough. Recommend monitoring for such s/s during/post meals.  PLAN/RECOMMENDATIONS:             A. Diet: more of a Mech Soft diet consistency w/ Meats CUT well and moistened well; Thin liquids. Pt was taught using a Puree for swallowing Pills if needed for ease of swallowing/clearing             B. Swallowing  Precautions: general aspiration precautions including f/u, Dry swallow to clear any remaining oral residue.              C. Recommended consultation to: f/u w/ GI for assessment of Esophageal phase motility; further education and management             D. Therapy recommendations: None at this time. Recommended pt monitor for any s/s of aspiration Esophageal phase dysmotility and overt coughing w/ oral intake writing it down in a Food Journal to increase awareness of any similarities or issues w/ particular foods/times             E. Results and recommendations were discussed w/ patient; video viewed; questions answered; precautions/strategies discussed and practiced Dysphagia, pharyngoesophageal phase  PT DOES *NOT* DESIRE NON-ORAL FEEDING OPTIONS.    PATIENT REPORTED OUTCOME MEASURES (PROM):   Pt returned the Speech QOL short form and scored 12/60 (lower numbers suggest better QOL).  Pt rated "I had trouble speaking" 3/5 (sometimes), and "I was frustrated by my speech difficulties" a 2/5 (rarely), and "I have ________ saying what I want to say" a 2/5 (a little difficulty).    TODAY'S TREATMENT:  06/22/22: [name] did not bring her phone with her so SLP performed therapy with pt's speech intelligibility in person instead of with the phone. Pt to bring phone next session in order to target phone intelligibility, along with reviewing MBSS and targeting any recommendations from MBSS. SLP explained that pt should cont to practice HEP and focus on over-articulation even if pt is well-understood now because in the future she may not be as precise with her articulation and habitualizing over articulation will make her speech better understood at that time. Today SLP used conversation to target pt's overarticulation - pt used overarticulation 80% of the time with 1 repeat requested each 10 minute conversation. When requested pt overarticulated and intelligibility incr'd to 100%. Pt rea'd SBA to complete  HEP.   06/15/22: See pt's therapy note in "chart review" for clinical swallow evaluation last session. Pat endorses coughing average <1 time/dinner, but only consumes one  meal/day - dinner. She has 3 smaller-size muffins with coffee for breakfast, a Boost at 1pm and one at 3pm, and then her dinner. She denies coughing with breakfast or with Boosts during the day.  It was ascertained by SLP today that Barbara Krause does not do much talking during the week. One short conversation with her sister/week, and average once/week conversation with each of her daughters. Kimbley said she and [name] talk mostly when they watch TV together for 30-60 minutes Mon-Fri, and [name] agreed with pt that she does not talk much at home. SLP asked pt if she wanted to cont to target talking and pt stated she would like to cont. SLP reminded pt that she may find it more challenging to verbally communicate and it would be good to practice overarticulation now so it's easier later. Pt also, when questioned by SLP, denied there were social or speaking situations she would participate more in if she could speak clearer. SLP is unsure of  feasibility of speech compensation carryover to outside ST room due to pt's limited talking time. Pt spoke with incr'd articulatory movements when reading conversational sentences and when reading 8-9 word sentences however intelligibility dipped from 90% to 70% due to longer utterance length. SLP educated pt and [name] that pt may find it beneficial to make verbal output "short and sweet" as time progresses because longer verbal utterances have greater possibility of being misunderstood. [name] agreed. SLP may do more therapy for intelligiblity via phone as that is more difficult situation for pt, according to daughter, and a mode pt frequently uses to communicate with others at this time.  06/08/22:  Pt did not follow precautions from previous MBSS three years ago. Suspect pt's memory deficit negatively impacted her  ability to do this.  SLP recommended precautions delineated in "Pt instructions": small bites/sips, double swallow, swallow solids prior to taking sip liquid - exception is to take 2-3 drops if pt needs moisture to swallow effectively.  [name] last MBSS was December 2020.  SLP discussed swallow precautions (as above, and in "pt instructions") and need for modified (MBSS). Pt agrees with suggestion of MBSS. [name], pt's aide was present to hear this and indicated she would assist pt in following these precautions. SLP to contact pt's referring MD/PCP and request order/script faxed to [phone]. Pt prefers this be completed at [facility] SLP explained that these precautions provided today may change after the swallow test at [facility]. Pt demonstrated understanding.   06/01/22: SLP explained rationale for having pt bring her own food for bedside/clinical swallow eval next session. SLP provided conversational sentences for pt to practice LOUD and CLEAR at home. She req'd occasional mod cues for breath support; occasional min-mod cues for overarticulation ("open your mouth") as pt's speech became ataxic intermittently. SLP explained how HEP for overartic should be completed. SLP modeled for [name] how to cue pt for overarticulation and for breath support. SLP used written/drawing cues for explaining talking at the top of the breath.   05/25/22 (eval): Discussed options for dysphagia level 3 and provided handout for diet items, reviewed pt's last MBSS and educated pt and daughter re: additional swallows recommended.  SLP worked with pt to find compensation for speech clarity that was best for pt and believed at this time that overarticulation is most effective compensation.    PATIENT EDUCATION: Education details: see above in "today's treatment" Person educated: Patient and Caregiver [name] Education method: Explanation, Demonstration, Verbal cues, Handouts, and  modeling Education comprehension:  verbalized understanding, returned demonstration, and needs further education     GOALS: Goals reviewed with patient? Yes  SHORT TERM GOALS: Target date: 06/22/2022    Pt will complete PROM in first 2 therapy sessions Baseline: Goal status: Met  2.  Pt will complete HEP for speech clarity compensations with rare min A in 2 sessions  Baseline: 06-15-22 Goal status: met  3.  Pt will demo speech compensations functionally in 10 minutes simple conversation  Baseline:  Goal status: Met  4.  Pt will participate in clinical swallow assessment with food from home, and participate in objective swallow eval if necessary Baseline:  Goal status: Met  5.  Pt will demo swallow precautions with food in 2 therapy sessions with modified independence, after MBSS Baseline:  Goal status: Revised   LONG TERM GOALS: Target date: 07/30/2022    Pt/caregiver will report that memory strategies pt engages in cont to improve pt's overall memory, between 3 sessions Baseline:  Goal  status: Ongoing  2.  Pt will complete HEP for speech clarity compensations with modified independence/assistance from aide/s in 2 sessions Baseline:  Goal status: Ongoing  3.  Pt will demo speech compensations functionally in 10 minutes simple conversation over three sessions Baseline:  Goal status: Ongoing  4.  [name] score on PROM for communication will improve when compared to initial score Baseline:  Goal status: Ongoing   ASSESSMENT:  CLINICAL IMPRESSION: Patient is a 81 y.o. female who was seen today for treatment of speech clarity and for swallow safety. Pt reported on eval date her biggest concern at this time is incr frequency of people asking her to repeat- daughter states more on the phone than in person; SLP tried to shift intelligibility practice to do more on the phone but pt did not bring her phone so SLP to target next time. In 10 minutes of conversation x3, pt req'd x1 repeat each conversation, and was  successful improving intelligibility to 100% with that repeat.  [name] said today that she has had no difficulty understanding pt In previous week. Pt will cont to benefit from skilled ST.  OBJECTIVE IMPAIRMENTS include memory, dysarthria, and dysphagia. These impairments are limiting patient from household responsibilities, ADLs/IADLs, effectively communicating at home and in community, and safety when swallowing. Factors affecting potential to achieve goals and functional outcome are co-morbidities and medical prognosis. Patient will benefit from skilled SLP services to address above impairments and improve overall function.  REHAB POTENTIAL: Good  PLAN: SLP FREQUENCY: 1x/week  SLP DURATION: 8 weeks  PLANNED INTERVENTIONS: Aspiration precaution training, Pharyngeal strengthening exercises, Diet toleration management , Environmental controls, Trials of upgraded texture/liquids, Internal/external aids, Oral motor tasks, and Functional tasks and memory compensations    [place], [place] 06/22/2022, 4:16 PM

## 2022-06-23 ENCOUNTER — Ambulatory Visit: Payer: Self-pay

## 2022-06-23 NOTE — Patient Outreach (Signed)
  Care Coordination   Initial Visit Note   06/23/2022 Name: Scherry Laverne MRN: [id number] DOB: [birth year]  Kyla Duffy is a 81 y.o. year old female who sees Einar Pheasant, Jobe Marker, MD for primary care. I spoke with  [name] by phone today  What matters to the patients health and wellness today?  Patient states she does not have any current needs. She states has a home health care and an aid    Goals Addressed             This Visit's Progress    Patient Stated:  Health Maintenance       Care Coordination Interventions: Discussed importance of regular follow up visit with providers. Discussed annual wellness visit Confirmed patient is receiving medications and has transportation to providers.           SDOH assessments and interventions completed:  Yes  SDOH Interventions Today    Flowsheet Row Most Recent Value  SDOH Interventions   Food Insecurity Interventions Intervention Not Indicated  Housing Interventions Intervention Not Indicated  Transportation Interventions Intervention Not Indicated        Care Coordination Interventions Activated:  Yes  Care Coordination Interventions:  Yes, provided   Follow up plan: No further intervention required.   Encounter Outcome:  Pt. Visit Completed   Quinn Plowman RN,BSN,CCM [place] Coordinator [phone]

## 2022-06-23 NOTE — Therapy (Signed)
[facility] [place] [address], [place], [place], [address] Phone: [phone]   Fax:  [phone]  Patient Details  Name: Barbara Krause MRN: [id number] Date of Birth: Sep 15, 1941 Referring Provider:  [name], MD  Encounter Date: 06/10/2022  Pt entered ST room and SLP noted pt had this appt scheduled in error, pt's POC indicates once/week and pt was seen 06/08/22. Pt was excused without tx.   [facility], [place] 06/23/2022, 9:16 AM  [facility] [place] [address] [place], [place], [address] Phone: [phone]   Fax:  [phone]

## 2022-06-23 NOTE — Patient Instructions (Signed)
Visit Information  Thank you for taking time to visit with me today. Please don't hesitate to contact me if I can be of assistance to you.   Following are the goals we discussed today:   Goals Addressed             This Visit's Progress    Patient Stated:  Health Maintenance       Care Coordination Interventions: Discussed importance of regular follow up visit with providers. Discussed annual wellness visit Confirmed patient is receiving medications and has transportation to providers.            If you are experiencing a Mental Health or [place] or need someone to talk to, please call the Suicide and Crisis Lifeline: 988 call 1-800-273-TALK (toll free, 24 hour hotline)  Patient verbalizes understanding of instructions and care plan provided today and agrees to view in [place]. Active MyChart status and patient understanding of how to access instructions and care plan via MyChart confirmed with patient.     Quinn Plowman RN,BSN,CCM RN Care Manager Coordinator [phone]

## 2022-06-28 ENCOUNTER — Ambulatory Visit
Admission: RE | Admit: 2022-06-28 | Discharge: 2022-06-28 | Disposition: A | Discharge status: Home / Self Care | Payer: Medicare Other | Source: Ambulatory Visit | Attending: Family Medicine | Admitting: Family Medicine

## 2022-06-28 DIAGNOSIS — G10 Huntington's disease: Secondary | ICD-10-CM | POA: Diagnosis present

## 2022-06-28 DIAGNOSIS — R6339 Other feeding difficulties: Secondary | ICD-10-CM | POA: Insufficient documentation

## 2022-06-28 DIAGNOSIS — R1312 Dysphagia, oropharyngeal phase: Secondary | ICD-10-CM | POA: Insufficient documentation

## 2022-06-28 DIAGNOSIS — R1319 Other dysphagia: Secondary | ICD-10-CM | POA: Insufficient documentation

## 2022-06-28 NOTE — Therapy (Signed)
[facility] [place], [place], [address] Phone: [phone]   Fax:     Modified Barium Swallow  Patient Details  Name: Barbara Krause MRN: [id number] Date of Birth: 11-29-1940 No data recorded  Encounter Date: 06/28/2022   End of Session - 06/28/22 1412     Visit Number 1    Number of Visits 1    Date for SLP Re-Evaluation 06/28/22    SLP Start Time 1250    SLP Stop Time  1330    SLP Time Calculation (min) 40 min    Activity Tolerance Patient tolerated treatment well             Past Medical History:  Diagnosis Date   Breast cancer ([place]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   Depression    History of chicken pox    History of colon polyps    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 During trials of increased texture/solids, pt exhibited min bolus piecemealing w/ foods; inconsistent oral (diffuse) residue noted almost as if unaware of the residue. Using the strategy (taught by SLP) of lingual sweep w/ a Dry, f/u swallow, pt cleared oral cavity completely. This  was utilized intermittently as instructed by SLP. During the pharyngeal phase, Pt exhibited timely pharyngeal swallow initiation w/ No laryngeal penetration or aspiration noted; adequate and timely airway closure. No signifcant pharyngeal residue remained post swallows indicating adequate laryngeal excursion and pharyngeal pressure during the swallows. During the Esophageal phase, slower Esophageal clearing noted; bolus stasis occurred x1 w/ trial of graham cracker(moistened w/ puree). ANY Esophageal phase dysmotility can result in Reflux activity and include overt s/s including the "Dry" cough, or Reflux cough. Recommend monitoring for such s/s during/post meals."  Diet Prior to this Study Regular;Thin liquids (caregiver chops into bite-size pieces)  Temperature Spikes Noted No  Respiratory Status Room air  History of Recent Intubation No  Behavior/Cognition Alert;Cooperative;Pleasant mood  Oral Cavity Assessment WFL  Oral Care Completed by SLP No  Oral Cavity - Dentition Adequate natural dentition  Vision Functional for self feeding  Self-Feeding Abilities Able to feed self  Patient Positioning Upright in chair  Baseline Vocal Quality Normal  Volitional Cough Strong  Volitional Swallow Able to elicit  Anatomy WFL  Pharyngeal Secretions Not observed secondary MBS  Oral Motor/Sensory Function  Overall Oral Motor/Sensory Function Other (comment) (choreic movements of the face, jaw and neck, tongue deviates slightly left)  Oral Preparation/Oral Phase  Oral Phase Impaired  Oral - Honey  Oral - Honey Teaspoon Premature spillage;Decreased bolus cohesion;Piecemeal  swallowing  Oral - Nectar  Oral - Nectar Teaspoon Premature spillage;Decreased bolus cohesion;Piecemeal swallowing  Oral - Nectar Cup Premature spillage;Decreased bolus cohesion;Piecemeal swallowing  Oral - Thin  Oral - Thin Teaspoon Premature spillage;Decreased bolus cohesion;Piecemeal swallowing  Oral - Thin Cup Premature  spillage;Decreased bolus cohesion;Piecemeal swallowing  Oral - Solids  Oral - Puree Premature spillage;Decreased bolus cohesion;Piecemeal swallowing  Oral - Regular Premature spillage;Decreased bolus cohesion;Piecemeal swallowing;Impaired mastication  Pharyngeal Phase  Pharyngeal Phase Impaired  Pharyngeal - Honey  Pharyngeal- Honey Teaspoon Delayed swallow initiation-vallecula;Reduced tongue base retraction;Reduced airway/laryngeal closure;Penetration/Aspiration during swallow;Significant aspiration (Amount);Pharyngeal residue - valleculae;Pharyngeal residue - pyriform (~10%)  Pharyngeal Material enters airway, passes BELOW cords then ejected out;Material enters airway, passes BELOW cords and not ejected out despite cough attempt by patient  Pharyngeal - Nectar  Pharyngeal- Nectar Teaspoon Delayed swallow initiation-vallecula;Reduced tongue base retraction;Reduced airway/laryngeal closure;Significant aspiration (Amount);Pharyngeal residue - valleculae;Pharyngeal residue - pyriform;Moderate aspiration;Penetration/Aspiration before swallow  Pharyngeal Material enters airway, passes BELOW cords and not ejected out despite cough attempt by patient;Material enters airway, passes BELOW cords without attempt by patient to eject out (silent aspiration) (needed cue to cough with smaller volume aspiration)  Pharyngeal- Nectar Cup Delayed swallow initiation-vallecula;Reduced tongue base retraction;Reduced airway/laryngeal closure;Significant aspiration (Amount);Pharyngeal residue - valleculae;Pharyngeal residue - pyriform;Moderate aspiration;Penetration/Aspiration before swallow  Pharyngeal Material enters airway, passes BELOW cords then ejected out;Material enters airway, passes BELOW cords and not ejected out despite cough attempt by patient  Pharyngeal - Thin  Pharyngeal- Thin Teaspoon Delayed swallow initiation-vallecula;Reduced tongue base retraction;Reduced airway/laryngeal closure;Significant  aspiration (Amount);Pharyngeal residue - valleculae;Pharyngeal residue - pyriform;Moderate aspiration;Penetration/Aspiration before swallow  Pharyngeal Material enters airway, passes BELOW cords and not ejected out despite cough attempt by patient;Material enters airway, passes BELOW cords then ejected out  Pharyngeal- Thin Cup Delayed swallow initiation-vallecula;Reduced tongue base retraction;Reduced airway/laryngeal closure;Significant aspiration (Amount);Pharyngeal residue - valleculae;Pharyngeal residue - pyriform;Moderate aspiration;Penetration/Aspiration before swallow  Pharyngeal Material enters airway, passes BELOW cords then ejected out;Material enters airway, passes BELOW cords and not ejected out despite cough attempt by patient  Pharyngeal - Solids  Pharyngeal- Puree Delayed swallow initiation-vallecula;Reduced tongue base retraction;Pharyngeal residue - valleculae;Pharyngeal residue - pyriform;Pharyngeal residue - posterior pharnyx  Pharyngeal Material does not enter airway  Pharyngeal- Regular Delayed swallow initiation-vallecula;Reduced tongue base retraction;Pharyngeal residue - valleculae;Pharyngeal residue - pyriform;Pharyngeal residue - posterior pharnyx  Pharyngeal Material does not enter airway  Cervical Esophageal Phase  Cervical Esophageal Phase Impaired  Cervical Esophageal Phase - Honey  Honey Teaspoon Prominent cricopharyngeal segment;Esophageal backflow into cervical esophagus  Cervical Esophageal Phase - Solids  Puree Prominent cricopharyngeal segment;Esophageal backflow into cervical esophagus  Regular Prominent cricopharyngeal segment;Esophageal backflow into cervical esophagus  Clinical Impression  Clinical Impression Patient presents with overall moderate to severe oropharyngeal dysphagia, with aspiration of thin, nectar and honey thick liquids. Suspect possible esophageal dysphagia may be a contributing (not primary factor), which may warrant further evaluation  depending on pt's goals of care. Oral phase noted for piecemealing of boluses, reduced oral control and premature spillage of liquids. Pharyngeal phase is characterized by mildly reduced tongue base retraction, and delayed/incomplete laryngeal closure, however, aspiration occurs largely before the swallow due to mistiming. In some instances, pt propels a portion of the bolus to pharynx, then aspirates while manipulating bolus that remains in the oral cavity.  There is frank aspiration of thin, nectar and honey thick liquids (~10% of bolus). Pt is sensate to larger volumes of aspiration, initiating a cough response that at times fully, at times partially clears the airway. She is  unable to eject aspiration that falls deeper into the trachea. There is mild residue, moreso with thicker consistencies in the valleculae, pyriforms, and along the posterior pharyngeal wall. Compensatory measures were attempted, including 3 second bolus hold, smaller sips, and chin tuck. Bolus hold was not effective in preventing or reducing volume of aspiration. [name] execution of strategies inconsistent at best, and none of the strategies attempted prevented aspiration/laryngeal intrusion. Bolus stasis and retrograde flow noted in the cervical esophagus, particularly as study progressed with thicker consistencies. A partial esophageal sweep was performed in the upright lateral projection and was noted for appearance of prominent cricopharyngeus, stasis in the cervical and thoracic esophagus, as well as retrograde flow of contrast which reached the pharyngoesophageal segment but was not observed to enter the pharynx. After the study, images were reviewed with patient and SLP discussed her goals of care. Pt restated desire to avoid non-oral means of nutrition. In context of pt's current medical status (no recent hospitalizations or history of pneumonia, no recent weight loss, no breathing difficulties or reports of choking episodes), and  given her aspiration of all liquid consistencies and appearance of reduced esophageal clearance, recommend pt continue with thin liquids. Thickened liquids likely to have more negative impact on pulmonary function if aspirated, and may also worsen/slow esophageal clearance. To reduce risk for aspiration and choking with solids, consider using NDD Level 6 or Level 5 solids, depending on pt's tolerance of this clinically. With pt's inclination for piecemealing and her discoordination with larger boluses, she may benefit from education/training in taking smaller bites and ensuring oral cavity is clear (swallow multiple times) before taking additional bites/sips. Also advise using caution with mixed consistencies. Recommend esophageal precautions: upright during and for a minimum of 30 minutes after meals.  SLP Visit Diagnosis Dysphagia, oropharyngeal phase (R13.12);Dysphagia, pharyngoesophageal phase (R13.14)  Impact on safety and function Moderate aspiration risk;Severe aspiration risk  Swallow Evaluation Recommendations  Recommended Consults Consider GI evaluation;Consider esophageal assessment  SLP Diet Recommendations Thin liquid;Other (Comment) (IDDSI Level 6 solids)  Liquid Administration via Cup  Medication Administration Crushed with puree  Supervision Patient able to self feed  Compensations Slow rate;Small sips/bites;Lingual sweep for clearance of pocketing;Multiple dry swallows after each bite/sip;Hard cough after swallow  Postural Changes Remain semi-upright after after feeds/meals (Comment);Seated upright at 90 degrees  Treatment Plan  Oral Care Recommendations Oral care before and after PO  Treatment Recommendations Defer treatment plan to f/u with SLP  Follow Up Recommendations Outpatient SLP  Prognosis  Prognosis for Safe Diet Advancement Guarded  Barriers to Reach Goals Severity of deficits;Other (Comment) (progressive neurologic disease)  Individuals Consulted  Consulted and Agree  with Results and Recommendations Patient  Report Sent to  Referring physician  Progression Toward Goals  Progression toward goals Goals met, education completed, patient discharged from SLP  SLP Time Calculation  SLP Start Time (ACUTE ONLY) 1250  SLP Stop Time (ACUTE ONLY) 1330  SLP Time Calculation (min) (ACUTE ONLY) 40 min  SLP Evaluations  $ SLP Speech Visit 1 Visit  SLP Evaluations  $Outpatient MBS Swallow 1 Procedure     Patient will benefit from skilled therapeutic intervention in order to improve the following deficits and impairments:   Dysphagia, oropharyngeal phase  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[place]) - Plan: DG SWALLOW STUDY OP MEDICARE SPEECH PATH, DG SWALLOW STUDY OP MEDICARE SPEECH PATH  Other dysphagia - Plan: DG SWALLOW STUDY OP MEDICARE SPEECH PATH, DG SWALLOW STUDY OP MEDICARE SPEECH PATH  Other feeding difficulties - Plan: DG SWALLOW  STUDY OP MEDICARE SPEECH PATH, DG SWALLOW STUDY OP MEDICARE SPEECH PATH        Problem List Patient Active Problem List   Diagnosis Date Noted   Acute bilateral low back pain without sciatica 03/22/2022   Recurrent falls 11/26/2020   Closed fracture dislocation of right elbow 11/24/2020   Moderate protein-calorie malnutrition ([place]) 06/30/2020   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   SAH (subarachnoid hemorrhage) ([place]) 06/15/2020   Aortic atherosclerosis ([place]) 08/30/2019   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[place]) 08/30/2019   Microscopic hematuria 04/02/2019   Chronic fatigue syndrome 04/02/2019   Osteoporosis 04/02/2019   Vitamin B12 deficiency 04/02/2019   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

## 2022-06-29 ENCOUNTER — Encounter: Payer: Self-pay | Admitting: Physical Therapy

## 2022-06-29 ENCOUNTER — Ambulatory Visit: Payer: Medicare Other

## 2022-06-29 ENCOUNTER — Ambulatory Visit: Payer: Medicare Other | Admitting: Physical Therapy

## 2022-06-29 DIAGNOSIS — R471 Dysarthria and anarthria: Secondary | ICD-10-CM

## 2022-06-29 DIAGNOSIS — R293 Abnormal posture: Secondary | ICD-10-CM

## 2022-06-29 DIAGNOSIS — R2689 Other abnormalities of gait and mobility: Secondary | ICD-10-CM

## 2022-06-29 DIAGNOSIS — R2681 Unsteadiness on feet: Secondary | ICD-10-CM

## 2022-06-29 DIAGNOSIS — R41841 Cognitive communication deficit: Secondary | ICD-10-CM

## 2022-06-29 DIAGNOSIS — R29818 Other symptoms and signs involving the nervous system: Secondary | ICD-10-CM

## 2022-06-29 DIAGNOSIS — R1310 Dysphagia, unspecified: Secondary | ICD-10-CM

## 2022-06-29 DIAGNOSIS — M6281 Muscle weakness (generalized): Secondary | ICD-10-CM

## 2022-06-29 NOTE — Therapy (Signed)
OUTPATIENT PHYSICAL THERAPY NEURO PROGRESS NOTE   Patient Name: Barbara Krause MRN: [id number] DOB:Apr 13, 1941, 81 y.o., female Today's Date: 06/29/2022   PCP: [name], MD REFERRING PROVIDER: Pleas Koch, NP    PT End of Session - 06/29/22 1543     Visit Number 10    Number of Visits 18    Date for PT Re-Evaluation 07/27/22    Authorization Type Medicare/BCBS    Progress Note Due on Visit 20    PT Start Time 6314    PT Stop Time 1537    PT Time Calculation (min) 50 min    Equipment Utilized During Treatment Gait belt    Activity Tolerance Patient tolerated treatment well    Behavior During Therapy [facility] for tasks assessed/performed             Progress Note Reporting Period 05/20/22 to 06/29/22  See note below for Objective Data and Assessment of Progress/Goals.         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   SAH (subarachnoid hemorrhage) ([employer] [facility]) 06/15/2020   Aortic atherosclerosis ([place]) 08/30/2019   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[place]) 08/30/2019   Microscopic hematuria 04/02/2019   Chronic fatigue syndrome 04/02/2019   Osteoporosis 04/02/2019   Vitamin B12 deficiency 04/02/2019   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   HTN (hypertension) 03/05/2019   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   Tobacco use 03/05/2019   Depression, major, single  episode, complete remission ([place]) 03/05/2019   Hx of breast cancer 03/05/2019   S/P bilateral mastectomy 03/05/2019    ONSET DATE: past several years  REFERRING DIAG: G10 (ICD-10-CM) - 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 My Huntington's is getting a little worse." Admits that she likes coming to therapy and would like to continue.   Pt accompanied by: caregiver, [name].  PERTINENT HISTORY: breast CA with B mastectomy 2009, depression, Huntington disease, osteoporosis  PAIN:  Are you having pain? No  PRECAUTIONS: Fall and Other: osteoporosis and compression deformities in T-spine   PATIENT GOALS improve strength  OBJECTIVE:    TODAY'S TREATMENT: 06/29/22 Activity Comments  Nustep L5 x 6 min Ues/Les  Cues to maintain ~50 SPM  STS with hands on knees/without Ues  Cues for proper set up, lifting chest to avoid getting stuck in trunk flexion    LOWER EXTREMITY MMT:     MMT (in sitting) Right Eval Left Eval Right 06/29/22 Left 06/29/22  Hip flexion 4 crepitus 4 4 crepitus 4+  Hip extension        Hip abduction $RemoveBefor'4 4 4 4  'ClnMxlMHCuPy$ Hip adduction 4 4- 4+ 4  Hip internal rotation        Hip external rotation        Knee flexion 4+ 4+ 4+ 4+  Knee extension 4+ crepitus 4+ crepitus 4+ crepitus 4+ crepitus   Ankle dorsiflexion 4+ 4+ 4+ 4+  Ankle plantarflexion 4+ 4+ 4+ 4+  Ankle inversion        Ankle eversion        (Blank rows = not  tested)   [facility] PT Assessment - 06/29/22 0001       Standardized Balance Assessment   Five times sit to stand comments  1 min 1 sec   2 reps pushing off LEs, 3 reps without UEs; cueing throughout to keep transfers going and to lift chest upright; 2nd set with armrests: 24.58 sec     Berg Balance Test   Sit to Stand Able to stand  independently using hands    Standing Unsupported Able to stand 2 minutes with supervision    Sitting with Back Unsupported but Feet Supported on Floor or Stool Able to sit safely and securely 2 minutes    Stand to Sit Controls descent by using hands    Transfers Able to transfer safely, definite need of hands    Standing Unsupported with Eyes Closed Able to stand 10 seconds with supervision    Standing Unsupported with Feet Together Able to place feet together independently and stand for 1 minute with supervision    From Standing, Reach Forward with Outstretched Arm Can reach forward >12 cm safely (5")    From Standing Position, Pick up Object from Floor Able to pick up shoe, needs supervision    From Standing Position, Turn to Look Behind Over each Shoulder Looks behind one side only/other side shows less weight shift    Turn 360 Degrees Able to turn 360 degrees safely but slowly    Standing Unsupported, Alternately Place Feet on Step/Stool Able to stand independently and safely and complete 8 steps in 20 seconds    Standing Unsupported, One Foot in Front Able to plae foot ahead of the other independently and hold 30 seconds   completed 14 sec in tandem   Standing on One Leg Able to lift leg independently and hold 5-10 seconds    Total Score 43      Timed Up and Go Test   Normal TUG (seconds) 28.64   with RW; good stability with turn  PATIENT EDUCATION: Education details: edu on exam findings, POC, recommended [place] gym or personal training to transition from PT to fitness activity for fitness maintenance; edu on use of lumbar roll for  improved posture Person educated: Patient and Caregiver [name] Education method: Customer service manager Education comprehension: verbalized understanding   HOME EXERCISE PROGRAM Last updated: 06/22/22 Access Code: [email or web address] URL: [email or web address] Date: 06/22/2022 Prepared by: [facility] - Outpatient  Rehab - [facility]  Exercises - Standing Toe Taps  - 1 x daily - 5 x weekly - 2 sets - 20 reps - Alternating Step Backward with Support  - 1 x daily - 5 x weekly - 2 sets - 10 reps - Sit to Stand Without Arm Support  - 1 x daily - 5 x weekly - 2 sets - 10 reps - Standing Hip Abduction with Resistance at Ankles and Counter Support  - 1 x daily - 5 x weekly - 2 sets - 10 reps - Sitting Knee Extension with Resistance  - 1 x daily - 5 x weekly - 2 sets - 10 reps   Below measures were taken at time of initial evaluation unless otherwise specified:  DIAGNOSTIC FINDINGS:  03/22/22 lumbar xray: No acute findings. Multiple compression deformities are identified the within the lumbar spine and lower thoracic spine which appear unchanged when compared with CT of the chest from 02/18/2022.  COGNITION: Overall cognitive status: Impaired   SENSATION: WFL  COORDINATION: Alt pronation/supination: slightly dysmetric on L Finger to nose: intact B Heel to shin: intact B but limited trunk control  MUSCLE TONE: intact B   POSTURE: rounded shoulders, forward head, and increased thoracic kyphosis  LOWER EXTREMITY ROM:     Active  Right Eval Left Eval  Hip flexion    Hip extension    Hip abduction    Hip adduction    Hip internal rotation    Hip external rotation    Knee flexion    Knee extension    Ankle dorsiflexion 11 15  Ankle plantarflexion    Ankle inversion    Ankle eversion     (Blank rows = not tested)  LOWER EXTREMITY MMT:    MMT (in sitting) Right Eval Left Eval  Hip flexion 4 crepitus 4  Hip extension    Hip abduction 4 4  Hip adduction 4  4-  Hip internal rotation    Hip external rotation    Knee flexion 4+ 4+  Knee extension 4+ crepitus 4+ crepitus  Ankle dorsiflexion 4+ 4+  Ankle plantarflexion 4+ 4+  Ankle inversion    Ankle eversion    (Blank rows = not tested)  GAIT: Gait pattern:  R foot vaulting with forward flexed trunk and slow gait speed  Assistive device utilized: Environmental consultant - 2 wheeled Level of assistance: SBA   PATIENT EDUCATION: Education details: prognosis, POC, HEP Person educated: Patient and Clinical cytogeneticist) Education method: Explanation, Demonstration, Corporate treasurer cues, Verbal cues, and Handouts Education comprehension: verbalized understanding   HOME EXERCISE PROGRAM: Access Code: [email or web address] URL: [email or web address] Date: 05/20/2022 Prepared by: [facility]  Exercises - Standing Toe Taps  - 1 x daily - 5 x weekly - 2 sets - 20 reps - Alternating Step Backward with Support  - 1 x daily - 5 x weekly - 2 sets - 10 reps - Sit to Stand Without Arm Support  - 1 x daily - 5 x weekly - 2 sets - 10 reps - Standing Hip  Abduction with Resistance at Ankles and Counter Support  - 1 x daily - 5 x weekly - 2 sets - 10 reps   GOALS: Goals reviewed with patient? Yes  SHORT TERM GOALS: Target date: 06/10/2022  Patient to be independent with initial HEP. Baseline: HEP initiated Goal status: MET    LONG TERM GOALS: Target date: 07/27/2022  Patient to be independent with advanced HEP. Baseline: Not yet initiated  Goal status: IN PROGRESS  Patient to demonstrate B LE strength >/=4+/5.  Baseline: See above; improving 06/29/22 Goal status: IN PROGRESS  Patient to demonstrate improved postural awareness and correction of posture at rest and with activity. Baseline: see above; pt reports awareness while doing her exercises 06/29/22 Goal status: IN PROGRESS  Patient to complete TUG in <18 sec with LRAD in order to decrease risk of falls.   Baseline: 21 sec; 28  sec 06/29/22 Goal status: IN PROGRESS  Patient to demonstrate 5xSTS test in <20 sec in order to decrease risk of falls.  Baseline: 26 sec; 61 sec 06/29/22 Goal status: IN PROGRESS  Patient to score at least 43/56 on Berg in order to decrease risk of falls.  Baseline: 37/56 Goal status: MET 06/29/22  Patient to score at least 45/56 on Berg in order to decrease risk of falls.  Baseline: 37/56; 43/56 06/29/22 Goal status: INITIAL  Patient to demonstrate alternating reciprocal stair navigation with 1 handrail with good stability.  Baseline: NT Goal status: INITIAL   ASSESSMENT:  CLINICAL IMPRESSION: Patient arrived to session with report of no change in balance or strength since initial assessment. Notes that she feels that her Huntington's Disease is progressing. Strength testing revealed improvement in L hip flexion, B hip adduction with remaining weakness in B hip abduction, R hip flexion, and L hip adduction. Patient's scores on TUG and 5xSTS have declined since initial assessment; however, patient did take increased time to safely turn with walker today compared to initial evaluation. Patient scored 43/56 on Merrilee Jansky, and has met this goal.  Notes that her awareness of her posture could use some work but reports that she does try to stand up tall when she is walking and doing her exercises. Patient is demonstrating good balance progress but requires continued skilled PT services to address LE strength and power needed for functional activities. Plan to continued for 2x/week for 4 weeks to address goals.    OBJECTIVE IMPAIRMENTS Abnormal gait, decreased activity tolerance, decreased balance, decreased coordination, difficulty walking, decreased strength, decreased safety awareness, increased muscle spasms, impaired flexibility, improper body mechanics, and postural dysfunction.   ACTIVITY LIMITATIONS carrying, lifting, bending, standing, squatting, stairs, transfers, bathing, toileting,  dressing, and reach over head  PARTICIPATION LIMITATIONS: meal prep, community activity, and church  PERSONAL FACTORS Age, Past/current experiences, Transportation, and 3+ comorbidities: breast CA with B mastectomy 2009, depression, Huntington disease, osteoporosis  are also affecting patient's functional outcome.   REHAB POTENTIAL: Good  CLINICAL DECISION MAKING: Evolving/moderate complexity  EVALUATION COMPLEXITY: Moderate  PLAN: PT FREQUENCY:  2 x/week  PT DURATION: other: 4 weeks  PLANNED INTERVENTIONS: Therapeutic exercises, Therapeutic activity, Neuromuscular re-education, Balance training, Gait training, Patient/Family education, Self Care, Joint mobilization, Stair training, Vestibular training, Canalith repositioning, Aquatic Therapy, Dry Needling, Cryotherapy, Moist heat, Taping, Manual therapy, and Re-evaluation  PLAN FOR NEXT SESSION: stair climbing and step ups, STS transfers with minimal use of Ues, hip abduction and quad strength   Janene Harvey, PT, DPT 06/29/22 3:44 PM  [facility] [address], [place], [place] [address] Phone # [phone] Fax # [phone]

## 2022-06-29 NOTE — Patient Instructions (Signed)
   Bite sizes should be dice-size - one bite at at time, swallow twice Take SMALLER sips - (no more than you can swallow with one swallow), swallow twice  Either FOOD, or LIQUID in your mouth at one time - DON'T MIX THEM TOGETHER -be careful about foods that have a liquid component (fruits like grapes, melons, oranges/grapefruits, and foods like ice cream, and jello, and soups with broth and pieces of food - I.E., beef barley, chicken noodle)  Medication in applesauce/pudding, medication crushed if possible

## 2022-06-29 NOTE — Therapy (Signed)
OUTPATIENT SPEECH LANGUAGE PATHOLOGY TREATMENT NOTE   Patient Name: Barbara Krause MRN: [id number] DOB:1940/11/12, 81 y.o., female Today's Date: 06/29/2022  PCP: Waunita Schooner, MD REFERRING PROVIDER: Pleas Koch, NP    End of Session - 06/29/22 1706     Visit Number 6    Number of Visits 9    Date for SLP Re-Evaluation 07/30/22    SLP Start Time 3235    SLP Stop Time  5732    SLP Time Calculation (min) 43 min    Activity Tolerance Patient tolerated treatment well                Past Medical History:  Diagnosis Date   Breast cancer ([place]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   Depression    History of chicken pox    History of colon polyps    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[place])    Kidney stone    Osteoporosis    Past Surgical History:  Procedure Laterality Date   CESAREAN SECTION     1977 and [place] Bilateral 2009   Patient Active Problem List   Diagnosis Date Noted   Acute bilateral low back pain without sciatica 03/22/2022   Recurrent falls 11/26/2020   Closed fracture dislocation of right elbow 11/24/2020   Moderate protein-calorie malnutrition ([place]) 06/30/2020   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   SAH (subarachnoid hemorrhage) ([place]) 06/15/2020   Aortic atherosclerosis ([place]) 08/30/2019   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[place]) 08/30/2019   Microscopic hematuria 04/02/2019   Chronic fatigue syndrome 04/02/2019   Osteoporosis 04/02/2019   Vitamin B12 deficiency 04/02/2019   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 [name] has had no difficulty understanding pt since last session. 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 No  PATIENT GOALS  Improve speech clarity  OBJECTIVE:   DIAGNOSTIC FINDINGS:  MODIFIED BARIUM SWALLOW 10-23-19 ASSESSMENT: Pt appears to present w/ slight-mild oral phase dysphagia; almost a min lack of awareness of oral residue during bolus management of food trials. During trials of increased texture/solids, pt exhibited min bolus piecemealing w/ foods; inconsistent oral (diffuse) residue noted almost as if unaware of the residue. Using the strategy (taught by SLP) of lingual sweep w/ a Dry, f/u swallow, pt cleared oral cavity completely. This was utilized intermittently as instructed by SLP. During the pharyngeal phase, Pt exhibited timely pharyngeal swallow initiation w/ No laryngeal penetration or aspiration noted; adequate and timely airway closure. No signifcant pharyngeal residue remained post swallows indicating adequate laryngeal excursion and pharyngeal pressure during the swallows. During the Esophageal phase, slower Esophageal clearing noted; bolus stasis occurred x1 w/ trial of graham cracker(moistened w/ puree). ANY Esophageal phase dysmotility can result in Reflux activity and include overt s/s including the "Dry" cough, or Reflux  cough. Recommend monitoring for such s/s during/post meals.  PLAN/RECOMMENDATIONS:             A. Diet: more of a Mech Soft diet consistency w/ Meats CUT well and moistened well; Thin liquids. Pt was taught using a Puree for swallowing Pills if needed for ease of swallowing/clearing             B. Swallowing  Precautions: general aspiration precautions including f/u, Dry swallow to clear any remaining oral residue.              C. Recommended consultation to: f/u w/ GI for assessment of Esophageal phase motility; further education and management             D. Therapy recommendations: None at this time. Recommended pt monitor for any s/s of aspiration Esophageal phase dysmotility and overt coughing w/ oral intake writing it down in a Food Journal to increase awareness of any similarities or issues w/ particular foods/times             E. Results and recommendations were discussed w/ patient; video viewed; questions answered; precautions/strategies discussed and practiced Dysphagia, pharyngoesophageal phase  PT DOES *NOT* DESIRE NON-ORAL FEEDING OPTIONS.    PATIENT REPORTED OUTCOME MEASURES (PROM):   Pt returned the Speech QOL short form and scored 12/60 (lower numbers suggest better QOL).  Pt rated "I had trouble speaking" 3/5 (sometimes), and "I was frustrated by my speech difficulties" a 2/5 (rarely), and "I have ________ saying what I want to say" a 2/5 (a little difficulty).    TODAY'S TREATMENT:  06/29/22: [name] attended session with pt today and SLP reviewed MBS from yesterday, indicating that recommendations were to keep pt the safest possible, and that she would need to make decisions based upon her own feelings, thoughts, and convictions due to her not desiring a PEG tube. See "pt instructions" for details reviewed today with [name] and [name]. Pt to bring food from home and consume with SLP in order to allow SLP to make suggestions PRN. Pt agreeable to this and extending visits once/week x3-4. Pt intelligible today 100%; [name] again states no difficulty with understanding pt at home.   06/22/22: Today [name] did not bring her phone with her so SLP performed therapy with pt's speech intelligibility in person instead of with the phone. Pt to bring phone next session in order to target phone intelligibility,  along with reviewing MBSS and targeting any recommendations from MBSS. SLP explained that pt should cont to practice HEP and focus on over-articulation even if pt is well-understood now because in the future she may not be as precise with her articulation and habitualizing over articulation will make her speech better understood at that time. Today SLP used conversation to target pt's overarticulation - pt used overarticulation 80% of the time with 1 repeat requested each 10 minute conversation. When requested pt overarticulated and intelligibility incr'd to 100%. Pt rea'd SBA to complete HEP.   06/15/22: See pt's therapy note in "chart review" for clinical swallow evaluation last session. Nonna endorses coughing average <1 time/dinner, but only consumes one  meal/day - dinner. She has 3 smaller-size muffins with coffee for breakfast, a Boost at 1pm and one at 3pm, and then her dinner. She denies coughing with breakfast or with Boosts during the day.  It was ascertained by SLP today that Bralee does not do much talking during the week. One short conversation with her sister/week, and average once/week conversation with each of her  daughters. Katerin said she and [name] talk mostly when they watch TV together for 30-60 minutes Mon-Fri, and [name] agreed with pt that she does not talk much at home. SLP asked pt if she wanted to cont to target talking and pt stated she would like to cont. SLP reminded pt that she may find it more challenging to verbally communicate and it would be good to practice overarticulation now so it's easier later. Pt also, when questioned by SLP, denied there were social or speaking situations she would participate more in if she could speak clearer. SLP is unsure of feasibility of speech compensation carryover to outside ST room due to pt's limited talking time. Pt spoke with incr'd articulatory movements when reading conversational sentences and when reading 8-9 word sentences however  intelligibility dipped from 90% to 70% due to longer utterance length. SLP educated pt and [name] that pt may find it beneficial to make verbal output "short and sweet" as time progresses because longer verbal utterances have greater possibility of being misunderstood. [name] agreed. SLP may do more therapy for intelligiblity via phone as that is more difficult situation for pt, according to daughter, and a mode pt frequently uses to communicate with others at this time.  06/08/22:  Pt did not follow precautions from previous MBSS three years ago. Suspect pt's memory deficit negatively impacted her ability to do this.  SLP recommended precautions delineated in "Pt instructions": small bites/sips, double swallow, swallow solids prior to taking sip liquid - exception is to take 2-3 drops if pt needs moisture to swallow effectively.  [name] last MBSS was December 2020.  SLP discussed swallow precautions (as above, and in "pt instructions") and need for modified (MBSS). Pt agrees with suggestion of MBSS. [name], pt's aide was present to hear this and indicated she would assist pt in following these precautions. SLP to contact pt's referring MD/PCP and request order/script faxed to [phone]. Pt prefers this be completed at [facility]). SLP explained that these precautions provided today may change after the swallow test at [facility]. Pt demonstrated understanding.   06/01/22: SLP explained rationale for having pt bring her own food for bedside/clinical swallow eval next session. SLP provided conversational sentences for pt to practice LOUD and CLEAR at home. She req'd occasional mod cues for breath support; occasional min-mod cues for overarticulation ("open your mouth") as pt's speech became ataxic intermittently. SLP explained how HEP for overartic should be completed. SLP modeled for [name] how to cue pt for overarticulation and for breath support. SLP used written/drawing cues for explaining talking at the top  of the breath.   05/25/22 (eval): Discussed options for dysphagia level 3 and provided handout for diet items, reviewed pt's last MBSS and educated pt and daughter re: additional swallows recommended.  SLP worked with pt to find compensation for speech clarity that was best for pt and believed at this time that overarticulation is most effective compensation.    PATIENT EDUCATION: Education details: see above in "today's treatment" Person educated: Patient and Caregiver [name] Education method: Explanation, Demonstration, Verbal cues, Handouts, and  modeling Education comprehension: verbalized understanding, returned demonstration, and needs further education     GOALS: Goals reviewed with patient? Yes  SHORT TERM GOALS: Target date: 06/22/2022    Pt will complete PROM in first 2 therapy sessions Baseline: Goal status: Met  2.  Pt will complete HEP for speech clarity compensations with rare min A in 2 sessions  Baseline: 06-15-22 Goal status: met  3.  Pt will demo speech compensations functionally in 10 minutes simple conversation  Baseline:  Goal status: Met  4.  Pt will participate in clinical swallow assessment with food from home, and participate in objective swallow eval if necessary Baseline:  Goal status: Met  5.  Pt will demo swallow precautions with food in 2 therapy sessions with modified independence, after MBSS Baseline:  Goal status: Revised   LONG TERM GOALS: Target date: 07/30/2022    Pt/caregiver will report that memory strategies pt engages in cont to improve pt's overall memory, between 3 sessions Baseline:  Goal status: Ongoing  2.  Pt will complete HEP for speech clarity compensations with modified independence/assistance from aide/s in 2 sessions Baseline:  Goal status: Ongoing  3.  Pt will demo speech compensations functionally in 10 minutes simple conversation over three sessions Baseline: 06-29-22 Goal status: Ongoing  4.  [name] score on PROM for  communication will improve when compared to initial score Baseline:  Goal status: Ongoing  5.  Pt will demo swallow precautions with food in 2 therapy sessions with modified independence, after MBSS Baseline:  Goal status: New  ASSESSMENT:  CLINICAL IMPRESSION: [name] met all STGs - dysphagia goal moved to LTGs. Patient is a 81 y.o. female who was seen for education about compensations and treatment of dysphagia  ollowing MBS yesterday. See "imaging" section for details. Speech clarity has been targeted in previous sessions.  Pt will cont to benefit from skilled ST.  OBJECTIVE IMPAIRMENTS include memory, dysarthria, and dysphagia. These impairments are limiting patient from household responsibilities, ADLs/IADLs, effectively communicating at home and in community, and safety when swallowing. Factors affecting potential to achieve goals and functional outcome are co-morbidities and medical prognosis. Patient will benefit from skilled SLP services to address above impairments and improve overall function.  REHAB POTENTIAL: Good  PLAN: SLP FREQUENCY: 1x/week  SLP DURATION: 8 weeks  PLANNED INTERVENTIONS: Aspiration precaution training, Pharyngeal strengthening exercises, Diet toleration management , Environmental controls, Trials of upgraded texture/liquids, Internal/external aids, Oral motor tasks, and Functional tasks and memory compensations    [place], Horatio 06/29/2022, 5:07 PM

## 2022-07-06 ENCOUNTER — Ambulatory Visit: Payer: Medicare Other | Attending: Primary Care

## 2022-07-06 DIAGNOSIS — R29818 Other symptoms and signs involving the nervous system: Secondary | ICD-10-CM | POA: Diagnosis present

## 2022-07-06 DIAGNOSIS — R471 Dysarthria and anarthria: Secondary | ICD-10-CM | POA: Insufficient documentation

## 2022-07-06 DIAGNOSIS — R293 Abnormal posture: Secondary | ICD-10-CM | POA: Insufficient documentation

## 2022-07-06 DIAGNOSIS — M6281 Muscle weakness (generalized): Secondary | ICD-10-CM | POA: Insufficient documentation

## 2022-07-06 DIAGNOSIS — R41841 Cognitive communication deficit: Secondary | ICD-10-CM | POA: Diagnosis present

## 2022-07-06 DIAGNOSIS — R1310 Dysphagia, unspecified: Secondary | ICD-10-CM | POA: Diagnosis not present

## 2022-07-06 DIAGNOSIS — R2681 Unsteadiness on feet: Secondary | ICD-10-CM | POA: Diagnosis present

## 2022-07-06 DIAGNOSIS — R2689 Other abnormalities of gait and mobility: Secondary | ICD-10-CM | POA: Insufficient documentation

## 2022-07-06 NOTE — Therapy (Unsigned)
OUTPATIENT SPEECH LANGUAGE PATHOLOGY TREATMENT NOTE   Patient Name: Barbara Krause MRN: [id number] DOB:03/09/1941, 81 y.o., female Today's Date: 07/06/2022  PCP: Waunita Schooner, MD REFERRING PROVIDER: Pleas Koch, NP    End of Session - 07/06/22 1608     Visit Number 7    Number of Visits 9    Date for SLP Re-Evaluation 07/30/22    SLP Start Time 102    SLP Stop Time  3428    SLP Time Calculation (min) 35 min    Activity Tolerance Patient tolerated treatment well                Past Medical History:  Diagnosis Date   Breast cancer ([place]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   Depression    History of chicken pox    History of colon polyps    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[place])    Kidney stone    Osteoporosis    Past Surgical History:  Procedure Laterality Date   CESAREAN SECTION     1977 and [place] Bilateral 2009   Patient Active Problem List   Diagnosis Date Noted   Acute bilateral low back pain without sciatica 03/22/2022   Recurrent falls 11/26/2020   Closed fracture dislocation of right elbow 11/24/2020   Moderate protein-calorie malnutrition ([place]) 06/30/2020   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   SAH (subarachnoid hemorrhage) ([place]) 06/15/2020   Aortic atherosclerosis ([place]) 08/30/2019   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[place]) 08/30/2019   Microscopic hematuria 04/02/2019   Chronic fatigue syndrome 04/02/2019   Osteoporosis 04/02/2019   Vitamin B12 deficiency 04/02/2019   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 [name] has had no difficulty understanding pt since last session. 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 No  PATIENT GOALS  Improve speech clarity  OBJECTIVE:   DIAGNOSTIC FINDINGS:  MODIFIED BARIUM SWALLOW 10-23-19 ASSESSMENT: Pt appears to present w/ slight-mild oral phase dysphagia; almost a min lack of awareness of oral residue during bolus management of food trials. During trials of increased texture/solids, pt exhibited min bolus piecemealing w/ foods; inconsistent oral (diffuse) residue noted almost as if unaware of the residue. Using the strategy (taught by SLP) of lingual sweep w/ a Dry, f/u swallow, pt cleared oral cavity completely. This was utilized intermittently as instructed by SLP. During the pharyngeal phase, Pt exhibited timely pharyngeal swallow initiation w/ No laryngeal penetration or aspiration noted; adequate and timely airway closure. No signifcant pharyngeal residue remained post swallows indicating adequate laryngeal excursion and pharyngeal pressure during the swallows. During the Esophageal phase, slower Esophageal clearing noted; bolus stasis occurred x1 w/ trial of graham cracker(moistened w/ puree). ANY Esophageal phase dysmotility can result in Reflux activity and include overt s/s including the "Dry" cough, or Reflux  cough. Recommend monitoring for such s/s during/post meals.  PLAN/RECOMMENDATIONS:             A. Diet: more of a Mech Soft diet consistency w/ Meats CUT well and moistened well; Thin liquids. Pt was taught using a Puree for swallowing Pills if needed for ease of swallowing/clearing             B. Swallowing  Precautions: general aspiration precautions including f/u, Dry swallow to clear any remaining oral residue.              C. Recommended consultation to: f/u w/ GI for assessment of Esophageal phase motility; further education and management             D. Therapy recommendations: None at this time. Recommended pt monitor for any s/s of aspiration Esophageal phase dysmotility and overt coughing w/ oral intake writing it down in a Food Journal to increase awareness of any similarities or issues w/ particular foods/times             E. Results and recommendations were discussed w/ patient; video viewed; questions answered; precautions/strategies discussed and practiced Dysphagia, pharyngoesophageal phase  PT DOES *NOT* DESIRE NON-ORAL FEEDING OPTIONS.    PATIENT REPORTED OUTCOME MEASURES (PROM):   Pt returned the Speech QOL short form and scored 12/60 (lower numbers suggest better QOL).  Pt rated "I had trouble speaking" 3/5 (sometimes), and "I was frustrated by my speech difficulties" a 2/5 (rarely), and "I have ________ saying what I want to say" a 2/5 (a little difficulty).    TODAY'S TREATMENT:  07/06/22: Substitute aide with [name] today; SLP did not have her come back to ST session. SLP heard [place] cough with strong/adequate cough, spontaneously, x3 during session (once following sip of water which pt stated she felt abnormal lingual movement prior to her swallow). Throughout session, pt demo'd success with smaller sip size. SLP reiterated rationale for smaller sips. SLP asked pt for voluntary cough to assess pt's ability at volitional cough following sip. Pt performed WNL/strong and appropriate cough 8 times in 17 attempts. FULL BREATH WAS OBTAINED FOR EACH ATTEMPT - initially with min A but faded to independent for full breath. For this reason, SLP does not think IMT would be necessary as pt's inspiratory cycle is capable of attaining WNL full breath. SLP told pt to practice strong cough at home in  hopes this practice will assist pt in strong cough more frequent/prevalent so that eventually pt could produce a higher frequency of strong cough in less attempts.   8/29/23[name] attended session with pt today and SLP reviewed MBS from yesterday, indicating that recommendations were to keep pt the safest possible, and that she would need to make decisions based upon her own feelings, thoughts, and convictions due to her not desiring a PEG tube. See "pt instructions" for details reviewed today with [name] and [name]. Pt to bring food from home and consume with SLP in order to allow SLP to make suggestions PRN. Pt agreeable to this and extending visits once/week x3-4. Pt intelligible today 100%; [name] again states no difficulty with understanding pt at home.   06/22/22: Today [name] did not bring her phone with her so SLP performed therapy with pt's speech intelligibility in person instead of with the phone. Pt to bring phone next session in order to target phone intelligibility, along with reviewing MBSS and targeting any recommendations from MBSS. SLP explained that pt should cont to practice HEP and focus on over-articulation  even if pt is well-understood now because in the future she may not be as precise with her articulation and habitualizing over articulation will make her speech better understood at that time. Today SLP used conversation to target pt's overarticulation - pt used overarticulation 80% of the time with 1 repeat requested each 10 minute conversation. When requested pt overarticulated and intelligibility incr'd to 100%. Pt rea'd SBA to complete HEP.   06/15/22: See pt's therapy note in "chart review" for clinical swallow evaluation last session. Shannell endorses coughing average <1 time/dinner, but only consumes one  meal/day - dinner. She has 3 smaller-size muffins with coffee for breakfast, a Boost at 1pm and one at 3pm, and then her dinner. She denies coughing with breakfast or with Boosts during  the day.  It was ascertained by SLP today that Porsche does not do much talking during the week. One short conversation with her sister/week, and average once/week conversation with each of her daughters. Kimanh said she and [name] talk mostly when they watch TV together for 30-60 minutes Mon-Fri, and [name] agreed with pt that she does not talk much at home. SLP asked pt if she wanted to cont to target talking and pt stated she would like to cont. SLP reminded pt that she may find it more challenging to verbally communicate and it would be good to practice overarticulation now so it's easier later. Pt also, when questioned by SLP, denied there were social or speaking situations she would participate more in if she could speak clearer. SLP is unsure of feasibility of speech compensation carryover to outside ST room due to pt's limited talking time. Pt spoke with incr'd articulatory movements when reading conversational sentences and when reading 8-9 word sentences however intelligibility dipped from 90% to 70% due to longer utterance length. SLP educated pt and [name] that pt may find it beneficial to make verbal output "short and sweet" as time progresses because longer verbal utterances have greater possibility of being misunderstood. [name] agreed. SLP may do more therapy for intelligiblity via phone as that is more difficult situation for pt, according to daughter, and a mode pt frequently uses to communicate with others at this time.  06/08/22:  Pt did not follow precautions from previous MBSS three years ago. Suspect pt's memory deficit negatively impacted her ability to do this.  SLP recommended precautions delineated in "Pt instructions": small bites/sips, double swallow, swallow solids prior to taking sip liquid - exception is to take 2-3 drops if pt needs moisture to swallow effectively.  [name] last MBSS was December 2020.  SLP discussed swallow precautions (as above, and in "pt instructions") and need for modified  (MBSS). Pt agrees with suggestion of MBSS. [name], pt's aide was present to hear this and indicated she would assist pt in following these precautions. SLP to contact pt's referring MD/PCP and request order/script faxed to [phone]. Pt prefers this be [facility] at [facility] SLP explained that these precautions provided today may change after the swallow test at [facility]. Pt demonstrated understanding.   06/01/22: SLP explained rationale for having pt bring her own food for bedside/clinical swallow eval next session. SLP provided conversational sentences for pt to practice LOUD and CLEAR at home. She req'd occasional mod cues for breath support; occasional min-mod cues for overarticulation ("open your mouth") as pt's speech became ataxic intermittently. SLP explained how HEP for overartic should be completed. SLP modeled for [name] how to cue pt for overarticulation and for breath support. SLP  used written/drawing cues for explaining talking at the top of the breath.   05/25/22 (eval): Discussed options for dysphagia level 3 and provided handout for diet items, reviewed pt's last MBSS and educated pt and daughter re: additional swallows recommended.  SLP worked with pt to find compensation for speech clarity that was best for pt and believed at this time that overarticulation is most effective compensation.    PATIENT EDUCATION: Education details: see above in "today's treatment" Person educated: Patient and Caregiver [name] Education method: Explanation, Demonstration, Verbal cues, Handouts, and  modeling Education comprehension: verbalized understanding, returned demonstration, and needs further education     GOALS: Goals reviewed with patient? Yes  SHORT TERM GOALS: Target date: 06/22/2022    Pt will complete PROM in first 2 therapy sessions Baseline: Goal status: Met  2.  Pt will complete HEP for speech clarity compensations with rare min A in 2 sessions  Baseline: 06-15-22 Goal  status: met  3.  Pt will demo speech compensations functionally in 10 minutes simple conversation  Baseline:  Goal status: Met  4.  Pt will participate in clinical swallow assessment with food from home, and participate in objective swallow eval if necessary Baseline:  Goal status: Met  5.  Pt will demo swallow precautions with food in 2 therapy sessions with modified independence, after MBSS Baseline:  Goal status: Revised   LONG TERM GOALS: Target date: 07/30/2022    Pt/caregiver will report that memory strategies pt engages in cont to improve pt's overall memory, between 3 sessions Baseline:  Goal status: Ongoing  2.  Pt will complete HEP for speech clarity compensations with modified independence/assistance from aide/s in 2 sessions Baseline: 07/06/22 Goal status: Ongoing  3.  Pt will demo speech compensations functionally in 10 minutes simple conversation over three sessions Baseline: 06-29-22 Goal status: Ongoing  4.  [name] score on PROM for communication will improve when compared to initial score Baseline:  Goal status: Ongoing  5.  Pt will demo swallow precautions with food in 2 therapy sessions with modified independence, after MBSS Baseline: 07/06/22 Goal status: Ongoing  ASSESSMENT:  CLINICAL IMPRESSION: [name] met all STGs - dysphagia goal moved to LTGs. [place] states to SLP that her speech is functional at this time. Patient is a 81 y.o. female who was seen today primarily for education about compensations and treatment of dysphagia. See "imaging" section for details of MBS 06/28/22. Speech clarity will continue to be targeted in her last ST 2 sessions.  Pt will cont to benefit from skilled ST.  OBJECTIVE IMPAIRMENTS include memory, dysarthria, and dysphagia. These impairments are limiting patient from household responsibilities, ADLs/IADLs, effectively communicating at home and in community, and safety when swallowing. Factors affecting potential to achieve goals and  functional outcome are co-morbidities and medical prognosis. Patient will benefit from skilled SLP services to address above impairments and improve overall function.  REHAB POTENTIAL: Good  PLAN: SLP FREQUENCY: 1x/week  SLP DURATION: 8 weeks  PLANNED INTERVENTIONS: Aspiration precaution training, Pharyngeal strengthening exercises, Diet toleration management , Environmental controls, Trials of upgraded texture/liquids, Internal/external aids, Oral motor tasks, and Functional tasks and memory compensations    [place], [place] 07/06/2022, 4:09 PM

## 2022-07-07 ENCOUNTER — Ambulatory Visit: Payer: Medicare Other

## 2022-07-07 DIAGNOSIS — R1310 Dysphagia, unspecified: Secondary | ICD-10-CM | POA: Diagnosis not present

## 2022-07-07 DIAGNOSIS — R2689 Other abnormalities of gait and mobility: Secondary | ICD-10-CM

## 2022-07-07 DIAGNOSIS — R293 Abnormal posture: Secondary | ICD-10-CM

## 2022-07-07 DIAGNOSIS — M6281 Muscle weakness (generalized): Secondary | ICD-10-CM

## 2022-07-07 DIAGNOSIS — R2681 Unsteadiness on feet: Secondary | ICD-10-CM

## 2022-07-07 DIAGNOSIS — R29818 Other symptoms and signs involving the nervous system: Secondary | ICD-10-CM

## 2022-07-07 NOTE — Therapy (Signed)
OUTPATIENT PHYSICAL THERAPY TREATMENT   Patient Name: Barbara Krause MRN: [id number] DOB:1941/04/11, 81 y.o., female Today's Date: 07/07/2022   PCP: [name], MD REFERRING PROVIDER: Pleas Koch, NP    PT End of Session - 07/07/22 1533     Visit Number 11    Number of Visits 18    Date for PT Re-Evaluation 07/27/22    Authorization Type Medicare/BCBS    Progress Note Due on Visit 62    PT Start Time 1530    PT Stop Time 2376    PT Time Calculation (min) 45 min    Equipment Utilized During Treatment Gait belt    Activity Tolerance Patient tolerated treatment well    Behavior During Therapy WFL for tasks assessed/performed               Past Medical History:  Diagnosis Date   Breast cancer ([place]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   Depression    History of chicken pox    History of colon polyps    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[place])    Kidney stone    Osteoporosis    Past Surgical History:  Procedure Laterality Date   CESAREAN SECTION     1977 and [place] Bilateral 2009   Patient Active Problem List   Diagnosis Date Noted   Acute bilateral low back pain without sciatica 03/22/2022   Recurrent falls 11/26/2020   Closed fracture dislocation of right elbow 11/24/2020   Moderate protein-calorie malnutrition ([place]) 06/30/2020   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   SAH (subarachnoid hemorrhage) ([place]) 06/15/2020   Aortic atherosclerosis ([place]) 08/30/2019   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[place]) 08/30/2019   Microscopic hematuria 04/02/2019   Chronic fatigue syndrome 04/02/2019   Osteoporosis 04/02/2019   Vitamin B12 deficiency 04/02/2019   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   HTN (hypertension) 03/05/2019   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   Tobacco use 03/05/2019   Depression, major, single episode, complete remission ([place]) 03/05/2019   Hx of breast cancer 03/05/2019   S/P bilateral mastectomy 03/05/2019    ONSET DATE: past several  years  REFERRING DIAG: G10 (ICD-10-CM) - 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  PERTINENT HISTORY: breast CA with B mastectomy 2009, depression, Huntington disease, osteoporosis  PAIN:  Are you having pain? No  PRECAUTIONS: Fall and Other: osteoporosis and compression deformities in T-spine   PATIENT GOALS improve strength  OBJECTIVE:    TODAY'S TREATMENT: 07/07/22 Activity Comments  NU-step x 9 min Resistance intervals 1:1 heavy:light w/ tactile cues to maintain SPM in order to improve coordination and fast/alternating movement  Stair ambulation Right HR,  step-to pattern leading up w/ left, descend w/ right--cues for pattern  Step-ups 2x10 8" box, rail support  Walking march 5# unilateral carry right/left 3 laps ea in parallel bars for single limb support           LOWER EXTREMITY MMT:     MMT (in sitting) Right Eval Left Eval Right 06/29/22 Left 06/29/22  Hip flexion 4 crepitus 4 4 crepitus 4+  Hip extension        Hip abduction _0 Hip adduction 4 4- 4+ 4  Hip internal rotation        Hip external rotation        Knee flexion 4+ 4+ 4+ 4+  Knee extension 4+ crepitus 4+ crepitus 4+ crepitus 4+ crepitus   Ankle dorsiflexion 4+ 4+ 4+ 4+  Ankle plantarflexion 4+ 4+ 4+ 4+  Ankle inversion        Ankle eversion        (Blank rows = not tested)     PATIENT EDUCATION: Education details: edu on exam findings,  POC, recommended [place] gym or personal training to transition from PT to fitness activity for fitness maintenance; edu on use of lumbar roll for improved posture Person educated: Patient and Caregiver [name] Education method: Explanation and Demonstration Education comprehension: verbalized understanding   HOME EXERCISE PROGRAM Last updated: 06/22/22 Access Code: [email or web address] URL: [email or web address] Date: 06/22/2022 Prepared by: [facility] - Outpatient  Rehab - [facility]  Exercises - Standing Toe Taps  - 1 x daily - 5 x weekly - 2 sets - 20 reps - Alternating Step Backward with Support  - 1 x daily - 5 x weekly - 2 sets - 10 reps - Sit to Stand Without Arm Support  - 1 x daily - 5 x weekly - 2 sets - 10 reps - Standing Hip Abduction with Resistance at Ankles and Counter Support  - 1 x daily - 5 x weekly - 2 sets - 10 reps - Sitting Knee Extension with Resistance  - 1 x daily - 5 x weekly - 2 sets - 10 reps   Below measures were taken at time of initial evaluation unless otherwise specified:  DIAGNOSTIC FINDINGS:  03/22/22 lumbar xray: No acute findings. Multiple compression deformities are identified the within the lumbar spine and lower thoracic spine which appear unchanged when compared with CT of the chest from 02/18/2022.  COGNITION: Overall cognitive status: Impaired   SENSATION: WFL  COORDINATION: Alt pronation/supination: slightly dysmetric on L Finger to nose: intact B Heel to shin: intact B but limited trunk control  MUSCLE TONE: intact B   POSTURE: rounded shoulders, forward head, and increased thoracic kyphosis  LOWER EXTREMITY ROM:     Active  Right Eval Left Eval  Hip flexion    Hip extension    Hip abduction    Hip adduction    Hip internal rotation    Hip  external rotation    Knee flexion    Knee extension    Ankle dorsiflexion 11 15  Ankle plantarflexion    Ankle inversion    Ankle eversion     (Blank rows = not  tested)  LOWER EXTREMITY MMT:    MMT (in sitting) Right Eval Left Eval  Hip flexion 4 crepitus 4  Hip extension    Hip abduction 4 4  Hip adduction 4 4-  Hip internal rotation    Hip external rotation    Knee flexion 4+ 4+  Knee extension 4+ crepitus 4+ crepitus  Ankle dorsiflexion 4+ 4+  Ankle plantarflexion 4+ 4+  Ankle inversion    Ankle eversion    (Blank rows = not tested)  GAIT: Gait pattern:  R foot vaulting with forward flexed trunk and slow gait speed  Assistive device utilized: Environmental consultant - 2 wheeled Level of assistance: SBA   PATIENT EDUCATION: Education details: prognosis, POC, HEP Person educated: Patient and Clinical cytogeneticist) Education method: Explanation, Demonstration, Corporate treasurer cues, Verbal cues, and Handouts Education comprehension: verbalized understanding   HOME EXERCISE PROGRAM: Access Code: [email or web address] URL: [email or web address] Date: 05/20/2022 Prepared by: [facility] - Outpatient  Rehab - [facility]  Exercises - Standing Toe Taps  - 1 x daily - 5 x weekly - 2 sets - 20 reps - Alternating Step Backward with Support  - 1 x daily - 5 x weekly - 2 sets - 10 reps - Sit to Stand Without Arm Support  - 1 x daily - 5 x weekly - 2 sets - 10 reps - Standing Hip Abduction with Resistance at Ankles and Counter Support  - 1 x daily - 5 x weekly - 2 sets - 10 reps   GOALS: Goals reviewed with patient? Yes  SHORT TERM GOALS: Target date: 06/10/2022  Patient to be independent with initial HEP. Baseline: HEP initiated Goal status: MET    LONG TERM GOALS: Target date: 07/27/2022  Patient to be independent with advanced HEP. Baseline: Not yet initiated  Goal status: IN PROGRESS  Patient to demonstrate B LE strength >/=4+/5.  Baseline: See above; improving 06/29/22 Goal status: IN PROGRESS  Patient to demonstrate improved postural awareness and correction of posture at rest and with activity. Baseline: see above; pt reports awareness while  doing her exercises 06/29/22 Goal status: IN PROGRESS  Patient to complete TUG in <18 sec with LRAD in order to decrease risk of falls.   Baseline: 21 sec; 28 sec 06/29/22 Goal status: IN PROGRESS  Patient to demonstrate 5xSTS test in <20 sec in order to decrease risk of falls.  Baseline: 26 sec; 61 sec 06/29/22 Goal status: IN PROGRESS  Patient to score at least 43/56 on Berg in order to decrease risk of falls.  Baseline: 37/56 Goal status: MET 06/29/22  Patient to score at least 45/56 on Berg in order to decrease risk of falls.  Baseline: 37/56; 43/56 06/29/22 Goal status: INITIAL  Patient to demonstrate alternating reciprocal stair navigation with 1 handrail with good stability.  Baseline: NT Goal status: INITIAL   ASSESSMENT:  CLINICAL IMPRESSION: Initially demo difficulty with stair ambulation w/ improved sequence following explanation/demonstration of "up with the good (left), down with the bad (right)".  RLE demonstrates significant weakness as compared to LLE when performing step ups requiring compensatory trunk flexion with ascending step and BUE support.  Marching activity to promote single limb support with good effect and tendency for right lateral LOB > left. Continued sessions indicated to progress dynamic  balance, BLE strength, and coordination to reduce risk for falls  OBJECTIVE IMPAIRMENTS Abnormal gait, decreased activity tolerance, decreased balance, decreased coordination, difficulty walking, decreased strength, decreased safety awareness, increased muscle spasms, impaired flexibility, improper body mechanics, and postural dysfunction.   ACTIVITY LIMITATIONS carrying, lifting, bending, standing, squatting, stairs, transfers, bathing, toileting, dressing, and reach over head  PARTICIPATION LIMITATIONS: meal prep, community activity, and church  PERSONAL FACTORS Age, Past/current experiences, Transportation, and 3+ comorbidities: breast CA with B mastectomy 2009,  depression, Huntington disease, osteoporosis  are also affecting patient's functional outcome.   REHAB POTENTIAL: Good  CLINICAL DECISION MAKING: Evolving/moderate complexity  EVALUATION COMPLEXITY: Moderate  PLAN: PT FREQUENCY:  2 x/week  PT DURATION: other: 4 weeks  PLANNED INTERVENTIONS: Therapeutic exercises, Therapeutic activity, Neuromuscular re-education, Balance training, Gait training, Patient/Family education, Self Care, Joint mobilization, Stair training, Vestibular training, Canalith repositioning, Aquatic Therapy, Dry Needling, Cryotherapy, Moist heat, Taping, Manual therapy, and Re-evaluation  PLAN FOR NEXT SESSION: stair climbing and step ups, STS transfers with minimal use of Ues, hip abduction and quad strength  4:30 PM, 07/07/22 M. Sherlyn Lees, PT, DPT Physical Therapist- [place] Office Number: [phone]    [place] [facility] [address] [place], [place] [address] Phone # [phone] Fax # [phone]

## 2022-07-12 ENCOUNTER — Other Ambulatory Visit: Payer: Self-pay | Admitting: Family Medicine

## 2022-07-12 ENCOUNTER — Telehealth: Payer: Self-pay

## 2022-07-13 ENCOUNTER — Ambulatory Visit: Payer: Medicare Other

## 2022-07-13 DIAGNOSIS — R1310 Dysphagia, unspecified: Secondary | ICD-10-CM | POA: Diagnosis not present

## 2022-07-13 DIAGNOSIS — M6281 Muscle weakness (generalized): Secondary | ICD-10-CM

## 2022-07-13 DIAGNOSIS — R293 Abnormal posture: Secondary | ICD-10-CM

## 2022-07-13 DIAGNOSIS — R2681 Unsteadiness on feet: Secondary | ICD-10-CM

## 2022-07-13 DIAGNOSIS — R29818 Other symptoms and signs involving the nervous system: Secondary | ICD-10-CM

## 2022-07-13 DIAGNOSIS — R2689 Other abnormalities of gait and mobility: Secondary | ICD-10-CM

## 2022-07-13 NOTE — Telephone Encounter (Signed)
I will accept this patient.  Please put her in my next open transfer of care office visit.  Please make sure to make sure I only have 1 transfer of care per week scheduled.

## 2022-07-13 NOTE — Therapy (Signed)
OUTPATIENT PHYSICAL THERAPY TREATMENT   Patient Name: Barbara Krause MRN: [id number] DOB:06/10/1941, 80 y.o., female Today's Date: 07/13/2022   PCP: Cody, Jessica R, MD REFERRING PROVIDER: Clark, Katherine K, NP    PT End of Session - 07/13/22 1452     Visit Number 12    Number of Visits 18    Date for PT Re-Evaluation 07/27/22    Authorization Type Medicare/BCBS    Progress Note Due on Visit 20    PT Start Time 1445    PT Stop Time 1530    PT Time Calculation (min) 45 min    Equipment Utilized During Treatment Gait belt    Activity Tolerance Patient tolerated treatment well    Behavior During Therapy WFL for tasks assessed/performed               Past Medical History:  Diagnosis Date   Breast cancer ([facility]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   Depression    History of chicken pox    History of colon polyps    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[facility])    Kidney stone    Osteoporosis    Past Surgical History:  Procedure Laterality Date   CESAREAN SECTION     1977 and 1979   CHOLECYSTECTOMY     TOTAL MASTECTOMY Bilateral 2009   Patient Active Problem List   Diagnosis Date Noted   Acute bilateral low back pain without sciatica 03/22/2022   Recurrent falls 11/26/2020   Closed fracture dislocation of right elbow 11/24/2020   Moderate protein-calorie malnutrition ([facility]) 06/30/2020   Intracerebral hemorrhage ([facility]) 06/15/2020   SAH (subarachnoid hemorrhage) ([facility]) 06/15/2020   Aortic atherosclerosis ([facility]) 08/30/2019   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[facility]) 08/30/2019   Microscopic hematuria 04/02/2019   Chronic fatigue syndrome 04/02/2019   Osteoporosis 04/02/2019   Vitamin B12 deficiency 04/02/2019   Aneurysm of thoracic aorta ([facility]) 04/02/2019   HTN (hypertension) 03/05/2019   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[facility]) 03/05/2019   Tobacco use 03/05/2019   Depression, major, single episode, complete remission ([facility]) 03/05/2019   Hx of breast cancer 03/05/2019   S/P bilateral mastectomy 03/05/2019    ONSET DATE: past  several years  REFERRING DIAG: G10 (ICD-10-CM) - 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[facility])  THERAPY DIAG:  Other abnormalities of gait and mobility  Unsteadiness on feet  Muscle weakness (generalized)  Other symptoms and signs involving the nervous system  Abnormal posture  Rationale for Evaluation and Treatment Rehabilitation  SUBJECTIVE:                                                                                                                                                                                                SUBJECTIVE STATEMENT: No new issues  Pt accompanied by: caregiver, [name].  PERTINENT HISTORY: breast CA with B mastectomy 2009, depression, Huntington disease, osteoporosis  PAIN:  Are you having pain? No  PRECAUTIONS: Fall and Other: osteoporosis and compression deformities in T-spine   PATIENT GOALS improve strength  OBJECTIVE:   TODAY'S TREATMENT: 07/13/22 Activity Comments  NU-step intervals x 7 min Heavy/slow:light/fast 1:9mn  Alt stair taps x 2 min 5#, 6" box In parallel bars  Sidestepping x 2 min 5# In parallel bars  LAQ 3x10 5#   Step-ups 1x10 6" box 1x10 6" box +airex pad  Standing on foam -EO/EC x 15 sec -EO/EC head turns        LOWER EXTREMITY MMT:     MMT (in sitting) Right Eval Left Eval Right 06/29/22 Left 06/29/22  Hip flexion 4 crepitus 4 4 crepitus 4+  Hip extension        Hip abduction _0 Hip adduction 4 4- 4+ 4  Hip internal rotation        Hip external rotation        Knee flexion 4+ 4+ 4+ 4+  Knee extension 4+ crepitus 4+ crepitus 4+ crepitus 4+ crepitus   Ankle dorsiflexion 4+ 4+ 4+ 4+  Ankle plantarflexion 4+ 4+ 4+ 4+  Ankle inversion        Ankle eversion        (Blank rows = not tested)     PATIENT EDUCATION: Education details: edu on exam findings, POC, recommended [place] personal training to transition from PT to fitness activity for fitness maintenance; edu on use of lumbar roll for improved  posture Person educated: Patient and Caregiver [name] Education method: Explanation and Demonstration Education comprehension: verbalized understanding   HOME EXERCISE PROGRAM Last updated: 06/22/22 Access Code: [email or web address] URL: [email or web address] Date: 06/22/2022 Prepared by: [facility] - [facility]  Exercises - Standing Toe Taps  - 1 x daily - 5 x weekly - 2 sets - 20 reps - Alternating Step Backward with Support  - 1 x daily - 5 x weekly - 2 sets - 10 reps - Sit to Stand Without Arm Support  - 1 x daily - 5 x weekly - 2 sets - 10 reps - Standing Hip Abduction with Resistance at Ankles and Counter Support  - 1 x daily - 5 x weekly - 2 sets - 10 reps - Sitting Knee Extension with Resistance  - 1 x daily - 5 x weekly - 2 sets - 10 reps   Below measures were taken at time of initial evaluation unless otherwise specified:  DIAGNOSTIC FINDINGS:  03/22/22 lumbar xray: No acute findings. Multiple compression deformities are identified the within the lumbar spine and lower thoracic spine which appear unchanged when compared with CT of the chest from 02/18/2022.  COGNITION: Overall cognitive status: Impaired   SENSATION: WFL  COORDINATION: Alt pronation/supination: slightly dysmetric on L Finger to nose: intact B Heel to shin: intact B but limited trunk control  MUSCLE TONE: intact B   POSTURE: rounded shoulders, forward head, and increased thoracic kyphosis  LOWER EXTREMITY ROM:     Active  Right Eval Left Eval  Hip flexion    Hip extension    Hip abduction    Hip adduction    Hip internal rotation    Hip external rotation    Knee flexion    Knee extension    Ankle dorsiflexion 11 15  Ankle  plantarflexion    Ankle inversion    Ankle eversion     (Blank rows = not tested)  LOWER EXTREMITY MMT:    MMT (in sitting) Right Eval Left Eval  Hip flexion 4 crepitus 4  Hip extension    Hip abduction 4 4  Hip adduction 4 4-  Hip  internal rotation    Hip external rotation    Knee flexion 4+ 4+  Knee extension 4+ crepitus 4+ crepitus  Ankle dorsiflexion 4+ 4+  Ankle plantarflexion 4+ 4+  Ankle inversion    Ankle eversion    (Blank rows = not tested)  GAIT: Gait pattern:  R foot vaulting with forward flexed trunk and slow gait speed  Assistive device utilized: Environmental consultant - 2 wheeled Level of assistance: SBA   PATIENT EDUCATION: Education details: prognosis, POC, HEP Person educated: Patient and Clinical cytogeneticist) Education method: Explanation, Demonstration, Corporate treasurer cues, Verbal cues, and Handouts Education comprehension: verbalized understanding   HOME EXERCISE PROGRAM: Access Code: [email or web address] URL: [email or web address] Date: 05/20/2022 Prepared by: [facility] - Outpatient  Rehab - [facility]  Exercises - Standing Toe Taps  - 1 x daily - 5 x weekly - 2 sets - 20 reps - Alternating Step Backward with Support  - 1 x daily - 5 x weekly - 2 sets - 10 reps - Sit to Stand Without Arm Support  - 1 x daily - 5 x weekly - 2 sets - 10 reps - Standing Hip Abduction with Resistance at Ankles and Counter Support  - 1 x daily - 5 x weekly - 2 sets - 10 reps   GOALS: Goals reviewed with patient? Yes  SHORT TERM GOALS: Target date: 06/10/2022  Patient to be independent with initial HEP. Baseline: HEP initiated Goal status: MET    LONG TERM GOALS: Target date: 07/27/2022  Patient to be independent with advanced HEP. Baseline: Not yet initiated  Goal status: IN PROGRESS  Patient to demonstrate B LE strength >/=4+/5.  Baseline: See above; improving 06/29/22 Goal status: IN PROGRESS  Patient to demonstrate improved postural awareness and correction of posture at rest and with activity. Baseline: see above; pt reports awareness while doing her exercises 06/29/22 Goal status: IN PROGRESS  Patient to complete TUG in <18 sec with LRAD in order to decrease risk of falls.   Baseline: 21 sec; 28 sec  06/29/22 Goal status: IN PROGRESS  Patient to demonstrate 5xSTS test in <20 sec in order to decrease risk of falls.  Baseline: 26 sec; 61 sec 06/29/22 Goal status: IN PROGRESS  Patient to score at least 43/56 on Berg in order to decrease risk of falls.  Baseline: 37/56 Goal status: MET 06/29/22  Patient to score at least 45/56 on Berg in order to decrease risk of falls.  Baseline: 37/56; 43/56 06/29/22 Goal status: INITIAL  Patient to demonstrate alternating reciprocal stair navigation with 1 handrail with good stability.  Baseline: NT Goal status: INITIAL   ASSESSMENT:  CLINICAL IMPRESSION: Progressed with activities to improve BLE strength and facilitate power for stair ambulation progressing to increased height and unstable surface to induce distal to proximal challenge. Tolerated increased resistance well without ill effects. Demonstrating improved coordination for hip/knee extension with step-up/stair ambulation.   OBJECTIVE IMPAIRMENTS Abnormal gait, decreased activity tolerance, decreased balance, decreased coordination, difficulty walking, decreased strength, decreased safety awareness, increased muscle spasms, impaired flexibility, improper body mechanics, and postural dysfunction.   ACTIVITY LIMITATIONS carrying, lifting, bending, standing, squatting, stairs, transfers, bathing, toileting, dressing, and reach over head  PARTICIPATION LIMITATIONS: meal prep, community activity, and church  PERSONAL FACTORS Age, Past/current experiences, Transportation, and 3+ comorbidities: breast CA with B mastectomy 2009, depression, Huntington disease, osteoporosis  are also affecting patient's functional outcome.   REHAB POTENTIAL: Good  CLINICAL DECISION MAKING: Evolving/moderate complexity  EVALUATION COMPLEXITY: Moderate  PLAN: PT FREQUENCY:  2 x/week  PT DURATION: other: 4 weeks  PLANNED INTERVENTIONS: Therapeutic exercises, Therapeutic activity, Neuromuscular  re-education, Balance training, Gait training, Patient/Family education, Self Care, Joint mobilization, Stair training, Vestibular training, Canalith repositioning, Aquatic Therapy, Dry Needling, Cryotherapy, Moist heat, Taping, Manual therapy, and Re-evaluation  PLAN FOR NEXT SESSION: stair climbing and step ups, STS transfers with minimal use of Ues, hip abduction and quad strength  2:53 PM, 07/13/22 M. Kelly , PT, DPT Physical Therapist- [place] Office Number: [phone]    [facility] [address], [address] [place],  [address] Phone # [phone] Fax # [phone] 

## 2022-07-13 NOTE — Telephone Encounter (Signed)
Barbara Krause states patient is requesting TOC to Dr. Diona Browner from Dr. Einar Pheasant.

## 2022-07-15 ENCOUNTER — Ambulatory Visit: Payer: Medicare Other

## 2022-07-15 DIAGNOSIS — R2689 Other abnormalities of gait and mobility: Secondary | ICD-10-CM

## 2022-07-15 DIAGNOSIS — R1310 Dysphagia, unspecified: Secondary | ICD-10-CM | POA: Diagnosis not present

## 2022-07-15 DIAGNOSIS — R41841 Cognitive communication deficit: Secondary | ICD-10-CM

## 2022-07-15 DIAGNOSIS — M6281 Muscle weakness (generalized): Secondary | ICD-10-CM

## 2022-07-15 DIAGNOSIS — R471 Dysarthria and anarthria: Secondary | ICD-10-CM

## 2022-07-15 DIAGNOSIS — R2681 Unsteadiness on feet: Secondary | ICD-10-CM

## 2022-07-15 DIAGNOSIS — R29818 Other symptoms and signs involving the nervous system: Secondary | ICD-10-CM

## 2022-07-15 DIAGNOSIS — R293 Abnormal posture: Secondary | ICD-10-CM

## 2022-07-15 NOTE — Patient Instructions (Signed)
   Signs of Aspiration Pneumonia   Chest pain/tightness Fever (can be low grade) Cough  With foul-smelling phlegm (sputum) With sputum containing pus or blood With greenish sputum Fatigue  Shortness of breath  Wheezing   **IF YOU HAVE THESE SIGNS, CONTACT YOUR DOCTOR OR GO TO THE EMERGENCY DEPARTMENT OR URGENT CARE AS SOON AS POSSIBLE**     

## 2022-07-15 NOTE — Therapy (Signed)
OUTPATIENT SPEECH LANGUAGE PATHOLOGY TREATMENT NOTE   Patient Name: Barbara Krause MRN: [id number] DOB:05-22-1941, 81 y.o., female Today's Date: 07/16/2022  PCP: Waunita Schooner, MD REFERRING PROVIDER: Pleas Koch, NP    End of Session - 07/15/22 1524     Visit Number 8    Number of Visits 9    Date for SLP Re-Evaluation 07/30/22    SLP Start Time 61    SLP Stop Time  64    SLP Time Calculation (min) 39 min    Activity Tolerance Patient tolerated treatment well                 Past Medical History:  Diagnosis Date   Breast cancer ([place]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   Depression    History of chicken pox    History of colon polyps    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[place])    Kidney stone    Osteoporosis    Past Surgical History:  Procedure Laterality Date   CESAREAN SECTION     1977 and [place] Bilateral 2009   Patient Active Problem List   Diagnosis Date Noted   Acute bilateral low back pain without sciatica 03/22/2022   Recurrent falls 11/26/2020   Closed fracture dislocation of right elbow 11/24/2020   Moderate protein-calorie malnutrition ([place]) 06/30/2020   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   SAH (subarachnoid hemorrhage) ([place]) 06/15/2020   Aortic atherosclerosis ([place]) 08/30/2019   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[place]) 08/30/2019   Microscopic hematuria 04/02/2019   Chronic fatigue syndrome 04/02/2019   Osteoporosis 04/02/2019   Vitamin B12 deficiency 04/02/2019   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   HTN (hypertension) 03/05/2019   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 No  PATIENT GOALS  Improve speech clarity  OBJECTIVE:   DIAGNOSTIC FINDINGS:  MODIFIED BARIUM SWALLOW 10-23-19 ASSESSMENT: Pt appears to present w/ slight-mild oral phase dysphagia; almost a min lack of awareness of oral residue during bolus management of food trials. During trials of increased texture/solids, pt exhibited min bolus piecemealing w/ foods; inconsistent oral (diffuse) residue noted almost as if unaware of the residue. Using the strategy (taught by SLP) of lingual sweep w/ a Dry, f/u swallow, pt cleared oral cavity completely. This was utilized intermittently as instructed by SLP. During the pharyngeal phase, Pt exhibited timely pharyngeal swallow initiation w/ No laryngeal penetration or aspiration noted; adequate and timely airway closure. No signifcant pharyngeal residue remained post swallows indicating adequate laryngeal excursion and pharyngeal pressure during the swallows. During the Esophageal phase, slower Esophageal clearing noted; bolus stasis occurred x1 w/ trial of graham cracker(moistened w/ puree). ANY Esophageal phase dysmotility can result in Reflux activity and include overt s/s including the "Dry" cough, or Reflux cough. Recommend monitoring for such s/s  during/post meals.  PLAN/RECOMMENDATIONS:             A. Diet: more of a Mech Soft diet consistency w/ Meats CUT well and moistened well; Thin liquids. Pt was taught using a Puree for swallowing Pills if needed for ease of swallowing/clearing             B. Swallowing Precautions: general aspiration precautions including f/u,  Dry swallow to clear any remaining oral residue.              C. Recommended consultation to: f/u w/ GI for assessment of Esophageal phase motility; further education and management             D. Therapy recommendations: None at this time. Recommended pt monitor for any s/s of aspiration Esophageal phase dysmotility and overt coughing w/ oral intake writing it down in a Food Journal to increase awareness of any similarities or issues w/ particular foods/times             E. Results and recommendations were discussed w/ patient; video viewed; questions answered; precautions/strategies discussed and practiced Dysphagia, pharyngoesophageal phase  PT DOES *NOT* DESIRE NON-ORAL FEEDING OPTIONS.    PATIENT REPORTED OUTCOME MEASURES (PROM):   Pt returned the Speech QOL short form and scored 12/60 (lower numbers suggest better QOL).  Pt rated "I had trouble speaking" 3/5 (sometimes), and "I was frustrated by my speech difficulties" a 2/5 (rarely), and "I have ________ saying what I want to say" a 2/5 (a little difficulty).    TODAY'S TREATMENT:  07/15/22: SLP worked with pt with coughing exercises again today with [name] present. Pt had 5 adequate coughs with 10 trials. SLP told pt/[name] to try pacing every 30 seconds to see if % is different than every 60 seconds or 90 seconds. Pt to do this at least twice a day.Pt with adequate breath size for each trial today. SLP worked with pt with "meds" (tic-tacs) in applesauce as pt has been having more difficulty with AM meds and will begin using applesauce. 3 "meds" and 4 "meds" with applesauce each left one residual that cleared with a level teaspoon of applesauce. SLP educated pt and aide on best liquid to drink to minimize risk of aspiration PNA is water, and best case is to complete good oral care prior to POs in order to minimize risk of aspiration PNA. Overt s/sx aspiration PNA were provided today for pt/aide. Speech: [name] speech 100% intelligible - [name] again  states she has no difficulty understanding pt.  07/06/22: Substitute aide with [name] today; SLP did not have her come back to ST session. SLP heard Khalise cough with strong/adequate cough, spontaneously, x3 during session (once following sip of water which pt stated she felt abnormal lingual movement prior to her swallow). Throughout session, pt demo'd success with smaller sip size. SLP reiterated rationale for smaller sips. SLP asked pt for voluntary cough to assess pt's ability at volitional cough following sip. Pt performed WNL/strong and appropriate cough 8 times in 17 attempts. FULL BREATH WAS OBTAINED FOR EACH ATTEMPT - initially with min A but faded to independent for full breath. For this reason, SLP does not think IMT would be necessary as pt's inspiratory cycle is capable of attaining WNL full breath. SLP told pt to practice strong cough at home in hopes this practice will assist pt in strong cough more frequent/prevalent so that eventually pt could produce a higher frequency of strong cough in less attempts.   8/29/23[name]  attended session with pt today and SLP reviewed MBS from yesterday, indicating that recommendations were to keep pt the safest possible, and that she would need to make decisions based upon her own feelings, thoughts, and convictions due to her not desiring a PEG tube. See "pt instructions" for details reviewed today with [name] and [name]. Pt to bring food from home and consume with SLP in order to allow SLP to make suggestions PRN. Pt agreeable to this and extending visits once/week x3-4. Pt intelligible today 100%; [name] again states no difficulty with understanding pt at home.   06/22/22: Today [name] did not bring her phone with her so SLP performed therapy with pt's speech intelligibility in person instead of with the phone. Pt to bring phone next session in order to target phone intelligibility, along with reviewing MBSS and targeting any recommendations from MBSS. SLP explained that  pt should cont to practice HEP and focus on over-articulation even if pt is well-understood now because in the future she may not be as precise with her articulation and habitualizing over articulation will make her speech better understood at that time. Today SLP used conversation to target pt's overarticulation - pt used overarticulation 80% of the time with 1 repeat requested each 10 minute conversation. When requested pt overarticulated and intelligibility incr'd to 100%. Pt rea'd SBA to complete HEP.   06/15/22: See pt's therapy note in "chart review" for clinical swallow evaluation last session. Tymber endorses coughing average <1 time/dinner, but only consumes one  meal/day - dinner. She has 3 smaller-size muffins with coffee for breakfast, a Boost at 1pm and one at 3pm, and then her dinner. She denies coughing with breakfast or with Boosts during the day.  It was ascertained by SLP today that [place] does not do much talking during the week. One short conversation with her sister/week, and average once/week conversation with each of her daughters. Mary said she and [name] talk mostly when they watch TV together for 30-60 minutes Mon-Fri, and [name] agreed with pt that she does not talk much at home. SLP asked pt if she wanted to cont to target talking and pt stated she would like to cont. SLP reminded pt that she may find it more challenging to verbally communicate and it would be good to practice overarticulation now so it's easier later. Pt also, when questioned by SLP, denied there were social or speaking situations she would participate more in if she could speak clearer. SLP is unsure of feasibility of speech compensation carryover to outside ST room due to pt's limited talking time. Pt spoke with incr'd articulatory movements when reading conversational sentences and when reading 8-9 word sentences however intelligibility dipped from 90% to 70% due to longer utterance length. SLP educated pt and [name] that pt  may find it beneficial to make verbal output "short and sweet" as time progresses because longer verbal utterances have greater possibility of being misunderstood. [name] agreed. SLP may do more therapy for intelligiblity via phone as that is more difficult situation for pt, according to daughter, and a mode pt frequently uses to communicate with others at this time.  06/08/22:  Pt did not follow precautions from previous MBSS three years ago. Suspect pt's memory deficit negatively impacted her ability to do this.  SLP recommended precautions delineated in "Pt instructions": small bites/sips, double swallow, swallow solids prior to taking sip liquid - exception is to take 2-3 drops if pt needs moisture to swallow effectively.  [name] last MBSS was  December 2020.  SLP discussed swallow precautions (as above, and in "pt instructions") and need for modified (MBSS). Pt agrees with suggestion of MBSS. [name], pt's aide was present to hear this and indicated she would assist pt in following these precautions. SLP to contact pt's referring MD/PCP and request order/script faxed to [phone]. Pt prefers this be completed at [facility]). SLP explained that these precautions provided today may change after the swallow test at [facility]. Pt demonstrated understanding.     PATIENT EDUCATION: Education details: see above in "today's treatment" Person educated: Patient and Caregiver [name] Education method: Explanation, Demonstration, Verbal cues, Handouts, and  modeling Education comprehension: verbalized understanding, returned demonstration, and needs further education    GOALS: Goals reviewed with patient? Yes  SHORT TERM GOALS: Target date: 06/22/2022    Pt will complete PROM in first 2 therapy sessions Baseline: Goal status: Met  2.  Pt will complete HEP for speech clarity compensations with rare min A in 2 sessions  Baseline: 06-15-22 Goal status: met  3.  Pt will demo speech compensations  functionally in 10 minutes simple conversation  Baseline:  Goal status: Met  4.  Pt will participate in clinical swallow assessment with food from home, and participate in objective swallow eval if necessary Baseline:  Goal status: Met  5.  Pt will demo swallow precautions with food in 2 therapy sessions with modified independence, after MBSS Baseline:  Goal status: Deferred due to timing of MBS   LONG TERM GOALS: Target date: 07/30/2022    Pt/caregiver will report that memory strategies pt engages in cont to improve pt's overall memory, between 3 sessions Baseline:  Goal status: Deferred  2.  Pt will complete HEP for speech clarity compensations with modified independence/assistance from aide/s in 2 sessions Baseline: 07/06/22 Goal status: Met  3.  Pt will demo speech compensations functionally in 10 minutes simple conversation over three sessions Baseline: 06-29-22, 07-15-22 Goal status: Ongoing  4.  [name] score on PROM for communication will improve when compared to initial score Baseline:  Goal status: Ongoing  5.  Pt will demo swallow precautions with food in 2 therapy sessions with modified independence, after MBSS Baseline: 07/06/22 Goal status: Ongoing  ASSESSMENT:  CLINICAL IMPRESSION: [name] met all STGs - dysphagia goal moved to LTGs. Alejandrina states to SLP that her speech is functional at this time. Patient is a 81 y.o. female who was seen today primarily for education about compensations and treatment of dysphagia. See "imaging" section for details of MBS 06/28/22. Speech clarity will continue to be targeted in her last ST 2 sessions.  Pt will cont to benefit from skilled ST.  OBJECTIVE IMPAIRMENTS include memory, dysarthria, and dysphagia. These impairments are limiting patient from household responsibilities, ADLs/IADLs, effectively communicating at home and in community, and safety when swallowing. Factors affecting potential to achieve goals and functional outcome are  co-morbidities and medical prognosis. Patient will benefit from skilled SLP services to address above impairments and improve overall function.  REHAB POTENTIAL: Good  PLAN: SLP FREQUENCY: 1x/week  SLP DURATION: 8 weeks  PLANNED INTERVENTIONS: Aspiration precaution training, Pharyngeal strengthening exercises, Diet toleration management , Environmental controls, Trials of upgraded texture/liquids, Internal/external aids, Oral motor tasks, and Functional tasks and memory compensations    Cesare Sumlin, [place] 07/16/2022, 1:00 AM

## 2022-07-15 NOTE — Therapy (Signed)
OUTPATIENT PHYSICAL THERAPY TREATMENT   Patient Name: Barbara Krause MRN: [id number] DOB:[birth year], 81 y.o., female Today's Date: 07/15/2022   PCP: [name], MD REFERRING PROVIDER: Pleas Koch, NP    PT End of Session - 07/15/22 1537     Visit Number 13    Number of Visits 18    Date for PT Re-Evaluation 07/27/22    Authorization Type Medicare/BCBS    Progress Note Due on Visit 71    PT Start Time 1530    PT Stop Time 9485    PT Time Calculation (min) 45 min    Equipment Utilized During Treatment Gait belt    Activity Tolerance Patient tolerated treatment well    Behavior During Therapy WFL for tasks assessed/performed               Past Medical History:  Diagnosis Date   Breast cancer ([place]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   Depression    History of chicken pox    History of colon polyps    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[place])    Kidney stone    Osteoporosis    Past Surgical History:  Procedure Laterality Date   CESAREAN SECTION     1977 and [place] Bilateral 2009   Patient Active Problem List   Diagnosis Date Noted   Acute bilateral low back pain without sciatica 03/22/2022   Recurrent falls 11/26/2020   Closed fracture dislocation of right elbow 11/24/2020   Moderate protein-calorie malnutrition ([place]) 06/30/2020   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   SAH (subarachnoid hemorrhage) ([place]) 06/15/2020   Aortic atherosclerosis ([place]) 08/30/2019   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[place]) 08/30/2019   Microscopic hematuria 04/02/2019   Chronic fatigue syndrome 04/02/2019   Osteoporosis 04/02/2019   Vitamin B12 deficiency 04/02/2019   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   HTN (hypertension) 03/05/2019   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   Tobacco use 03/05/2019   Depression, major, single episode, complete remission ([place]) 03/05/2019   Hx of breast cancer 03/05/2019   S/P bilateral mastectomy 03/05/2019    ONSET DATE: past  several years  REFERRING DIAG: G10 (ICD-10-CM) - 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  PERTINENT HISTORY: breast CA with B mastectomy 2009, depression, Huntington disease, osteoporosis  PAIN:  Are you having pain? No  PRECAUTIONS: Fall and Other: osteoporosis and compression deformities in T-spine   PATIENT GOALS improve strength  OBJECTIVE:   TODAY'S TREATMENT: 07/15/22 Activity Comments  NU-step resistance intervals x 8 min 1:1   Alt stair taps x 2 min 5# 12" platform, BUE support. Able to perform 1.5 min before fatigue  Hip abd 2x10 5#  Standing hamstring curls 2x10 5#  LAQ 2x10 5#  Sit-stand w/ med ball toss 10x 4.4#  Trunk twists 10x 4.4# Right/left  Stepping strategy Colored dots in arc position w/ verbal cues for foot to dot 1x 3 min with 5# ankle weights, 1x3 min w/out weight     TODAY'S TREATMENT: 07/13/22 Activity Comments  NU-step intervals x 7 min Heavy/slow:light/fast 1:43mn  Alt stair taps x 2 min 5#, 6" box In parallel bars  Sidestepping x 2 min 5# In parallel bars  LAQ 3x10 5#   Step-ups 1x10 6" box 1x10 6" box +airex pad  Standing on foam -EO/EC x 15 sec -EO/EC head turns        LOWER EXTREMITY MMT:     MMT (in sitting) Right Eval Left Eval Right 06/29/22 Left 06/29/22  Hip flexion 4 crepitus 4 4 crepitus 4+  Hip extension        Hip abduction _0 Hip adduction 4 4- 4+ 4  Hip internal rotation         Hip external rotation        Knee flexion 4+ 4+ 4+ 4+  Knee extension 4+ crepitus 4+ crepitus 4+ crepitus 4+ crepitus   Ankle dorsiflexion 4+ 4+ 4+ 4+  Ankle plantarflexion 4+ 4+ 4+ 4+  Ankle inversion        Ankle eversion        (Blank rows = not tested)     PATIENT EDUCATION: Education details: edu on exam findings, POC, recommended [employer] or personal training to transition from PT to fitness activity for fitness maintenance; edu on use of lumbar roll for improved posture Person educated: Patient and Caregiver [name] Education method: Explanation and Demonstration Education comprehension: verbalized understanding   HOME EXERCISE PROGRAM Last updated: 06/22/22 Access Code: [email or web address] URL: [email or web address] Date: 06/22/2022 [facility]: [facility]  Exercises - Standing Toe Taps  - 1 x daily - 5 x weekly - 2 sets - 20 reps - Alternating Step Backward with Support  - 1 x daily - 5 x weekly - 2 sets - 10 reps - Sit to Stand Without Arm Support  - 1 x daily - 5 x weekly - 2 sets - 10 reps - Standing Hip Abduction with Resistance at Ankles and Counter Support  - 1 x daily - 5 x weekly - 2 sets - 10 reps - Sitting Knee Extension with Resistance  - 1 x daily - 5 x weekly - 2 sets - 10 reps   Below measures were taken at time of initial evaluation unless otherwise specified:  DIAGNOSTIC FINDINGS:  03/22/22 lumbar xray: No acute findings. Multiple compression deformities are identified the within the lumbar spine and lower thoracic spine which appear unchanged when compared with CT of the chest from 02/18/2022.  COGNITION: Overall cognitive status: Impaired   SENSATION: WFL  COORDINATION: Alt pronation/supination: slightly  dysmetric on L Finger to nose: intact B Heel to shin: intact B but limited trunk control  MUSCLE TONE: intact B   POSTURE: rounded shoulders, forward head, and increased thoracic  kyphosis  LOWER EXTREMITY ROM:     Active  Right Eval Left Eval  Hip flexion    Hip extension    Hip abduction    Hip adduction    Hip internal rotation    Hip external rotation    Knee flexion    Knee extension    Ankle dorsiflexion 11 15  Ankle plantarflexion    Ankle inversion    Ankle eversion     (Blank rows = not tested)  LOWER EXTREMITY MMT:    MMT (in sitting) Right Eval Left Eval  Hip flexion 4 crepitus 4  Hip extension    Hip abduction 4 4  Hip adduction 4 4-  Hip internal rotation    Hip external rotation    Knee flexion 4+ 4+  Knee extension 4+ crepitus 4+ crepitus  Ankle dorsiflexion 4+ 4+  Ankle plantarflexion 4+ 4+  Ankle inversion    Ankle eversion    (Blank rows = not tested)  GAIT: Gait pattern:  R foot vaulting with forward flexed trunk and slow gait speed  Assistive device utilized: Environmental consultant - 2 wheeled Level of assistance: SBA   PATIENT EDUCATION: Education details: prognosis, POC, HEP Person educated: Patient and Clinical cytogeneticist) Education method: Explanation, Demonstration, Corporate treasurer cues, Verbal cues, and Handouts Education comprehension: verbalized understanding   HOME EXERCISE PROGRAM: Access Code: [email or web address] URL: [email or web address] Date: 05/20/2022 Prepared by: [facility] - Outpatient  Rehab - [facility]  Exercises - Standing Toe Taps  - 1 x daily - 5 x weekly - 2 sets - 20 reps - Alternating Step Backward with Support  - 1 x daily - 5 x weekly - 2 sets - 10 reps - Sit to Stand Without Arm Support  - 1 x daily - 5 x weekly - 2 sets - 10 reps - Standing Hip Abduction with Resistance at Ankles and Counter Support  - 1 x daily - 5 x weekly - 2 sets - 10 reps   GOALS: Goals reviewed with patient? Yes  SHORT TERM GOALS: Target date: 06/10/2022  Patient to be independent with initial HEP. Baseline: HEP initiated Goal status: MET    LONG TERM GOALS: Target date: 07/27/2022  Patient to be independent with  advanced HEP. Baseline: Not yet initiated  Goal status: IN PROGRESS  Patient to demonstrate B LE strength >/=4+/5.  Baseline: See above; improving 06/29/22 Goal status: IN PROGRESS  Patient to demonstrate improved postural awareness and correction of posture at rest and with activity. Baseline: see above; pt reports awareness while doing her exercises 06/29/22 Goal status: IN PROGRESS  Patient to complete TUG in <18 sec with LRAD in order to decrease risk of falls.   Baseline: 21 sec; 28 sec 06/29/22 Goal status: IN PROGRESS  Patient to demonstrate 5xSTS test in <20 sec in order to decrease risk of falls.  Baseline: 26 sec; 61 sec 06/29/22 Goal status: IN PROGRESS  Patient to score at least 43/56 on Berg in order to decrease risk of falls.  Baseline: 37/56 Goal status: MET 06/29/22  Patient to score at least 45/56 on Berg in order to decrease risk of falls.  Baseline: 37/56; 43/56 06/29/22 Goal status: INITIAL  Patient to demonstrate alternating reciprocal stair navigation with 1 handrail with good stability.  Baseline: NT Goal status: INITIAL  ASSESSMENT:  CLINICAL IMPRESSION: Treatment impetus on strengthening and metabolic conditioning to increase activity tolerance and benefit of resistance training for muscular control/coordination.  Demo good power and coordination with resisted sit to stand with retro-LOB 25% of trials from forceful postural perturbation from weight of ball.  Difficulty with assuming narrow BOS and crossing legs over midline opting for turning body to touch target from the sagittal plane vs frontal plane challenge. Continued sessions to advance strength training and balance activities to improve motor control and reduce risk for falls  OBJECTIVE IMPAIRMENTS Abnormal gait, decreased activity tolerance, decreased balance, decreased coordination, difficulty walking, decreased strength, decreased safety awareness, increased muscle spasms, impaired  flexibility, improper body mechanics, and postural dysfunction.   ACTIVITY LIMITATIONS carrying, lifting, bending, standing, squatting, stairs, transfers, bathing, toileting, dressing, and reach over head  PARTICIPATION LIMITATIONS: meal prep, community activity, and church  PERSONAL FACTORS Age, Past/current experiences, Transportation, and 3+ comorbidities: breast CA with B mastectomy 2009, depression, Huntington disease, osteoporosis  are also affecting patient's functional outcome.   REHAB POTENTIAL: Good  CLINICAL DECISION MAKING: Evolving/moderate complexity  EVALUATION COMPLEXITY: Moderate  PLAN: PT FREQUENCY:  2 x/week  PT DURATION: other: 4 weeks  PLANNED INTERVENTIONS: Therapeutic exercises, Therapeutic activity, Neuromuscular re-education, Balance training, Gait training, Patient/Family education, Self Care, Joint mobilization, Stair training, Vestibular training, Canalith repositioning, Aquatic Therapy, Dry Needling, Cryotherapy, Moist heat, Taping, Manual therapy, and Re-evaluation  PLAN FOR NEXT SESSION: stair climbing and step ups, STS transfers with minimal use of Ues, hip abduction and quad strength  3:38 PM, 07/15/22 M. Sherlyn Lees, PT, DPT Physical Therapist- [place] Office Number: [phone]    [place] at [facility] [address] [place], [place] [address] Phone # [phone] Fax # [phone]

## 2022-07-19 NOTE — Therapy (Signed)
OUTPATIENT PHYSICAL THERAPY TREATMENT   Patient Name: Barbara Krause MRN: [id number] DOB:1941-02-09, 81 y.o., female Today's Date: 07/20/2022   PCP: [name], MD REFERRING PROVIDER: Pleas Koch, NP    PT End of Session - 07/20/22 1530     Visit Number 14    Number of Visits 18    Date for PT Re-Evaluation 07/27/22    Authorization Type Medicare/BCBS    Progress Note Due on Visit 37    PT Start Time 1450    PT Stop Time 1529    PT Time Calculation (min) 39 min    Equipment Utilized During Treatment Gait belt    Activity Tolerance Patient tolerated treatment well;Patient limited by fatigue    Behavior During Therapy WFL for tasks assessed/performed                Past Medical History:  Diagnosis Date   Breast cancer ([place]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   Depression    History of chicken pox    History of colon polyps    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[place])    Kidney stone    Osteoporosis    Past Surgical History:  Procedure Laterality Date   CESAREAN SECTION     1977 and [place] Bilateral 2009   Patient Active Problem List   Diagnosis Date Noted   Acute bilateral low back pain without sciatica 03/22/2022   Recurrent falls 11/26/2020   Closed fracture dislocation of right elbow 11/24/2020   Moderate protein-calorie malnutrition ([place]) 06/30/2020   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   SAH (subarachnoid hemorrhage) ([place]) 06/15/2020   Aortic atherosclerosis ([place]) 08/30/2019   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[place]) 08/30/2019   Microscopic hematuria 04/02/2019   Chronic fatigue syndrome 04/02/2019   Osteoporosis 04/02/2019   Vitamin B12 deficiency 04/02/2019   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   HTN (hypertension) 03/05/2019   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 Car is still in the shop.  Pt accompanied by: caregiver, [name].  PERTINENT HISTORY: breast CA with B mastectomy 2009, depression, Huntington disease, osteoporosis  PAIN:  Are you having pain? No  PRECAUTIONS: Fall and Other: osteoporosis and compression deformities in T-spine   PATIENT GOALS improve strength  OBJECTIVE:      TODAY'S TREATMENT: 07/20/22 Activity Comments  Nustep L4 x 6 min Ues/Les  Cueing to maintain atleast 60SPM  farmer's carry 7# in each hand 2x170f Difficulty performing recprocal arm swing; cues to promote heel-toe pattern d/t R heel dragging  sidestepping with #7 2x464fCueing for upright posture, flexed knees  Backwards walking 4x507fntermittent LOB and narrow BOS  step ups with 1 handrail 6" 10x Manual cues to encourage unlocking R knee upon step back   PATIENT EDUCATION: Education details: discussed fitness plans moving forward- patient reports that she is not interested in the gym and will work on her HEP at home after PT discharge Person educated: Patient Education method: Explanation, Demonstration, Tactile cues, Verbal cues, and Handouts Education comprehension: verbalized understanding and returned demonstration    HOME EXERCISE PROGRAM Last updated: 06/22/22 Access Code: [email or web address] URL: [email or web address] Date: 06/22/2022 [facility]: [facility]  Exercises - Standing Toe Taps  - 1 x daily - 5 x weekly - 2 sets - 20 reps - Alternating Step Backward with Support  - 1 x daily - 5 x weekly - 2 sets - 10 reps - Sit to Stand Without Arm Support  - 1 x daily - 5 x weekly - 2 sets - 10 reps - Standing Hip Abduction with Resistance at Ankles and Counter Support  - 1 x daily - 5 x weekly - 2 sets - 10 reps - Sitting Knee Extension with Resistance  - 1 x daily - 5 x weekly - 2 sets - 10 reps   Below measures were taken at time of initial evaluation unless otherwise specified:  DIAGNOSTIC FINDINGS:  03/22/22 lumbar xray: No acute findings. Multiple compression deformities are identified the within the lumbar spine and lower thoracic spine which appear unchanged when compared with CT of the chest from 02/18/2022.  COGNITION: Overall cognitive status: Impaired   SENSATION: WFL  COORDINATION: Alt pronation/supination: slightly dysmetric on L Finger to nose: intact B Heel to shin: intact B but limited trunk control  MUSCLE TONE: intact B   POSTURE: rounded shoulders, forward head, and increased thoracic kyphosis  LOWER EXTREMITY ROM:     Active  Right Eval Left Eval  Hip flexion    Hip extension    Hip abduction    Hip adduction    Hip internal rotation    Hip external rotation    Knee flexion    Knee extension    Ankle dorsiflexion 11 15  Ankle plantarflexion    Ankle inversion    Ankle eversion     (Blank rows = not tested)   LOWER EXTREMITY MMT:     MMT (in sitting) Right Eval Left Eval Right 06/29/22 Left 06/29/22  Hip flexion 4 crepitus 4 4 crepitus 4+  Hip extension        Hip abduction 4 4 4 4   Hip adduction 4 4- 4+ 4  Hip internal rotation        Hip external rotation        Knee flexion 4+ 4+ 4+ 4+  Knee extension 4+ crepitus 4+ crepitus 4+ crepitus 4+  crepitus   Ankle dorsiflexion 4+ 4+ 4+ 4+  Ankle plantarflexion 4+ 4+ 4+ 4+  Ankle inversion        Ankle eversion        (Blank  rows = not tested)     GAIT: Gait pattern:  R foot vaulting with forward flexed trunk and slow gait speed  Assistive device utilized: Walker - 2 wheeled Level of assistance: SBA   PATIENT EDUCATION: Education details: prognosis, POC, HEP Person educated: Patient and Child(ren) Education method: Explanation, Demonstration, Tactile cues, Verbal cues, and Handouts Education comprehension: verbalized understanding   HOME EXERCISE PROGRAM: Access Code: [email or web address] URL: [email or web address] Date: 05/20/2022 Prepared by: [facility] - Outpatient  Rehab - [facility]  Exercises - Standing Toe Taps  - 1 x daily - 5 x weekly - 2 sets - 20 reps - Alternating Step Backward with Support  - 1 x daily - 5 x weekly - 2 sets - 10 reps - Sit to Stand Without Arm Support  - 1 x daily - 5 x weekly - 2 sets - 10 reps - Standing Hip Abduction with Resistance at Ankles and Counter Support  - 1 x daily - 5 x weekly - 2 sets - 10 reps   GOALS: Goals reviewed with patient? Yes  SHORT TERM GOALS: Target date: 06/10/2022  Patient to be independent with initial HEP. Baseline: HEP initiated Goal status: MET    LONG TERM GOALS: Target date: 07/27/2022  Patient to be independent with advanced HEP. Baseline: Not yet initiated  Goal status: IN PROGRESS  Patient to demonstrate B LE strength >/=4+/5.  Baseline: See above; improving 06/29/22 Goal status: IN PROGRESS  Patient to demonstrate improved postural awareness and correction of posture at rest and with activity. Baseline: see above; pt reports awareness while doing her exercises 06/29/22 Goal status: IN PROGRESS  Patient to complete TUG in <18 sec with LRAD in order to decrease risk of falls.   Baseline: 21 sec; 28 sec 06/29/22 Goal status: IN PROGRESS  Patient to demonstrate 5xSTS test in <20 sec in order to decrease risk of falls.  Baseline: 26 sec; 61 sec 06/29/22 Goal status: IN PROGRESS  Patient to score at least  43/56 on Berg in order to decrease risk of falls.  Baseline: 37/56 Goal status: MET 06/29/22  Patient to score at least 45/56 on Berg in order to decrease risk of falls.  Baseline: 37/56; 43/56 06/29/22 Goal status: INITIAL  Patient to demonstrate alternating reciprocal stair navigation with 1 handrail with good stability.  Baseline: NT Goal status: INITIAL   ASSESSMENT:  CLINICAL IMPRESSION: Patient arrived to session without new complaints. Discussed plans to transition to gym after PT D/C- patient declined and reported that she will continue with HEP at home. Worked on providing balance challenges with gait activities today. Patient appeared slightly more unsteady today compared to previous sessions. Required intermittent rest breaks d/t fatigue. Worked on quad strengthening with manual cues to encourage unlocking R knee d/t weakness. Patient reported understanding of all edu provided and without complaints at end of session.    OBJECTIVE IMPAIRMENTS Abnormal gait, decreased activity tolerance, decreased balance, decreased coordination, difficulty walking, decreased strength, decreased safety awareness, increased muscle spasms, impaired flexibility, improper body mechanics, and postural dysfunction.   ACTIVITY LIMITATIONS carrying, lifting, bending, standing, squatting, stairs, transfers, bathing, toileting, dressing, and reach over head  PARTICIPATION LIMITATIONS: meal prep, community activity, and church  PERSONAL FACTORS Age, Past/current experiences, Transportation, and 3+ comorbidities: breast CA with  B mastectomy 2009, depression, Huntington disease, osteoporosis  are also affecting patient's functional outcome.   REHAB POTENTIAL: Good  CLINICAL DECISION MAKING: Evolving/moderate complexity  EVALUATION COMPLEXITY: Moderate  PLAN: PT FREQUENCY:  2 x/week  PT DURATION: other: 4 weeks  PLANNED INTERVENTIONS: Therapeutic exercises, Therapeutic activity, Neuromuscular  re-education, Balance training, Gait training, Patient/Family education, Self Care, Joint mobilization, Stair training, Vestibular training, Canalith repositioning, Aquatic Therapy, Dry Needling, Cryotherapy, Moist heat, Taping, Manual therapy, and Re-evaluation  PLAN FOR NEXT SESSION: stair climbing and step ups, STS transfers with minimal use of Ues, hip abduction and quad strength  Janene Harvey, PT, DPT 07/20/22 3:32 PM  [facility] at [facility] [address] [place], [place] [address] Phone # [phone] Fax # [phone]

## 2022-07-20 ENCOUNTER — Ambulatory Visit: Payer: Medicare Other | Admitting: Physical Therapy

## 2022-07-20 ENCOUNTER — Encounter: Payer: Self-pay | Admitting: Physical Therapy

## 2022-07-20 ENCOUNTER — Ambulatory Visit: Payer: Medicare Other

## 2022-07-20 DIAGNOSIS — R1310 Dysphagia, unspecified: Secondary | ICD-10-CM | POA: Diagnosis not present

## 2022-07-20 DIAGNOSIS — R2689 Other abnormalities of gait and mobility: Secondary | ICD-10-CM

## 2022-07-20 DIAGNOSIS — R2681 Unsteadiness on feet: Secondary | ICD-10-CM

## 2022-07-20 DIAGNOSIS — R29818 Other symptoms and signs involving the nervous system: Secondary | ICD-10-CM

## 2022-07-20 DIAGNOSIS — M6281 Muscle weakness (generalized): Secondary | ICD-10-CM

## 2022-07-22 ENCOUNTER — Ambulatory Visit: Payer: Medicare Other

## 2022-07-22 DIAGNOSIS — R1310 Dysphagia, unspecified: Secondary | ICD-10-CM | POA: Diagnosis not present

## 2022-07-22 DIAGNOSIS — R2681 Unsteadiness on feet: Secondary | ICD-10-CM

## 2022-07-22 DIAGNOSIS — R2689 Other abnormalities of gait and mobility: Secondary | ICD-10-CM

## 2022-07-22 DIAGNOSIS — M6281 Muscle weakness (generalized): Secondary | ICD-10-CM

## 2022-07-22 DIAGNOSIS — R293 Abnormal posture: Secondary | ICD-10-CM

## 2022-07-22 DIAGNOSIS — R29818 Other symptoms and signs involving the nervous system: Secondary | ICD-10-CM

## 2022-07-22 NOTE — Therapy (Signed)
OUTPATIENT PHYSICAL THERAPY TREATMENT   Patient Name: Barbara Krause MRN: [id number] DOB:05/05/1941, 81 y.o., female Today's Date: 07/22/2022   PCP: [name], MD REFERRING PROVIDER: Pleas Koch, NP    PT End of Session - 07/22/22 1441     Visit Number 15    Number of Visits 18    Date for PT Re-Evaluation 07/27/22    Authorization Type Medicare/BCBS    Progress Note Due on Visit 42    PT Start Time 1445    PT Stop Time 5483    PT Time Calculation (min) 45 min    Equipment Utilized During Treatment Gait belt    Activity Tolerance Patient tolerated treatment well;Patient limited by fatigue    Behavior During Therapy WFL for tasks assessed/performed                Past Medical History:  Diagnosis Date   Breast cancer ([place]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   Depression    History of chicken pox    History of colon polyps    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   Tobacco use 03/05/2019   Depression, major, single episode, complete remission ([place]) 03/05/2019   Hx of breast cancer 03/05/2019   S/P bilateral mastectomy 03/05/2019     ONSET DATE: past several years  REFERRING DIAG: G10 (ICD-10-CM) - 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   Pt accompanied by: caregiver, [name].  PERTINENT HISTORY: breast CA with B mastectomy 2009, depression, Huntington disease, osteoporosis  PAIN:  Are you having pain? No  PRECAUTIONS: Fall and Other: osteoporosis and compression deformities in T-spine   PATIENT GOALS improve strength  OBJECTIVE:   TODAY'S TREATMENT: 07/22/22 Activity Comments  NU-step Level 6 x 3 min for warm-up. Motor multi-tasking x 3 min (LE peddling + throwing/catching ball, pass-offs right/left)  Coordination/motor multi-tasking -performing various stair taps for BLE alt movements and bias to single limb support coupled with throwing/catching ball   Alt stair taps 10" box x 2 min Standing on airex pad BUE to single UE support alternating Q 30 sec  Lateral stair tap to OH press 1x10   Forward/backward, lateral-lateral 10x over half bolster         TODAY'S TREATMENT: 07/20/22 Activity Comments  Nustep L4 x 6 min Ues/Les  Cueing to maintain atleast 60SPM  farmer's carry 7# in each hand 2x165f Difficulty performing recprocal arm swing; cues to promote heel-toe pattern d/t R heel dragging  sidestepping with #7 2x434fCueing for upright posture, flexed knees  Backwards walking 4x5099fntermittent LOB and narrow BOS  step ups with 1 handrail 6" 10x Manual cues to  encourage unlocking R knee upon step back   PATIENT EDUCATION: Education details: discussed fitness plans moving forward- patient reports that she is not interested in the gym and will work on her HEP at home after PT discharge Person educated: Patient Education method: Explanation, Demonstration, Tactile cues, Verbal cues, and Handouts Education comprehension: verbalized understanding and returned demonstration    HOME EXERCISE PROGRAM Last updated: 06/22/22 Access Code: [email or web address] URL: [email or web address] Date: 06/22/2022 [facility]  Exercises - Standing Toe Taps  - 1 x daily - 5 x weekly - 2 sets - 20 reps - Alternating Step Backward with Support  - 1 x daily - 5 x weekly - 2 sets - 10 reps - Sit to Stand Without Arm Support  - 1 x daily - 5 x weekly - 2 sets - 10 reps - Standing Hip Abduction with Resistance at Ankles and Counter Support  - 1 x daily - 5 x weekly - 2 sets - 10 reps - Sitting Knee Extension with Resistance  - 1 x daily - 5 x weekly - 2 sets - 10 reps   Below measures were taken at time of initial evaluation unless otherwise specified:  DIAGNOSTIC FINDINGS:  03/22/22 lumbar xray: No acute findings. Multiple compression deformities are identified the within the lumbar spine and lower thoracic spine which appear unchanged when compared with CT of the chest from 02/18/2022.  COGNITION: Overall cognitive status: Impaired   SENSATION: WFL  COORDINATION: Alt pronation/supination: slightly dysmetric on L Finger to nose: intact B Heel to shin: intact B but limited trunk control  MUSCLE TONE: intact B   POSTURE: rounded shoulders, forward head, and increased thoracic kyphosis  LOWER EXTREMITY ROM:     Active  Right Eval Left Eval  Hip flexion    Hip extension    Hip abduction    Hip adduction    Hip internal rotation    Hip external rotation    Knee flexion    Knee extension    Ankle  dorsiflexion 11 15  Ankle plantarflexion    Ankle inversion    Ankle eversion     (  Blank rows = not tested)   LOWER EXTREMITY MMT:     MMT (in sitting) Right Eval Left Eval Right 06/29/22 Left 06/29/22  Hip flexion 4 crepitus 4 4 crepitus 4+  Hip extension        Hip abduction _0 Hip adduction 4 4- 4+ 4  Hip internal rotation        Hip external rotation        Knee flexion 4+ 4+ 4+ 4+  Knee extension 4+ crepitus 4+ crepitus 4+ crepitus 4+ crepitus   Ankle dorsiflexion 4+ 4+ 4+ 4+  Ankle plantarflexion 4+ 4+ 4+ 4+  Ankle inversion        Ankle eversion        (Blank rows = not tested)     GAIT: Gait pattern:  R foot vaulting with forward flexed trunk and slow gait speed  Assistive device utilized: Environmental consultant - 2 wheeled Level of assistance: SBA   PATIENT EDUCATION: Education details: prognosis, POC, HEP Person educated: Patient and Child(ren) Education method: Explanation, Demonstration, Tactile cues, Verbal cues, and Handouts Education comprehension: verbalized understanding   HOME EXERCISE PROGRAM: Access Code: [email or web address] URL: [email or web address] Date: 05/20/2022 Prepared by: [facility] - Outpatient  Rehab - [facility]  Exercises - Standing Toe Taps  - 1 x daily - 5 x weekly - 2 sets - 20 reps - Alternating Step Backward with Support  - 1 x daily - 5 x weekly - 2 sets - 10 reps - Sit to Stand Without Arm Support  - 1 x daily - 5 x weekly - 2 sets - 10 reps - Standing Hip Abduction with Resistance at Ankles and Counter Support  - 1 x daily - 5 x weekly - 2 sets - 10 reps   GOALS: Goals reviewed with patient? Yes  SHORT TERM GOALS: Target date: 06/10/2022  Patient to be independent with initial HEP. Baseline: HEP initiated Goal status: MET    LONG TERM GOALS: Target date: 07/27/2022  Patient to be independent with advanced HEP. Baseline: Not yet initiated  Goal status: IN PROGRESS  Patient to demonstrate B LE strength  >/=4+/5.  Baseline: See above; improving 06/29/22 Goal status: IN PROGRESS  Patient to demonstrate improved postural awareness and correction of posture at rest and with activity. Baseline: see above; pt reports awareness while doing her exercises 06/29/22 Goal status: IN PROGRESS  Patient to complete TUG in <18 sec with LRAD in order to decrease risk of falls.   Baseline: 21 sec; 28 sec 06/29/22 Goal status: IN PROGRESS  Patient to demonstrate 5xSTS test in <20 sec in order to decrease risk of falls.  Baseline: 26 sec; 61 sec 06/29/22 Goal status: IN PROGRESS  Patient to score at least 43/56 on Berg in order to decrease risk of falls.  Baseline: 37/56 Goal status: MET 06/29/22  Patient to score at least 45/56 on Berg in order to decrease risk of falls.  Baseline: 37/56; 43/56 06/29/22 Goal status: INITIAL  Patient to demonstrate alternating reciprocal stair navigation with 1 handrail with good stability.  Baseline: NT Goal status: INITIAL   ASSESSMENT:  CLINICAL IMPRESSION: Tx emphasis on motor multi-tasking to improve cognitive control and provide for motor planning and sequencing.  Techniques to facilitate rapid alternating movements to enhance coordination and volitional control.  Able to demo 80% accuracy throughout motor multi-task activities. LTG re-assessment at next session   OBJECTIVE IMPAIRMENTS Abnormal gait, decreased activity tolerance, decreased balance, decreased coordination, difficulty walking, decreased  strength, decreased safety awareness, increased muscle spasms, impaired flexibility, improper body mechanics, and postural dysfunction.   ACTIVITY LIMITATIONS carrying, lifting, bending, standing, squatting, stairs, transfers, bathing, toileting, dressing, and reach over head  PARTICIPATION LIMITATIONS: meal prep, community activity, and church  PERSONAL FACTORS Age, Past/current experiences, Transportation, and 3+ comorbidities: breast CA with B mastectomy  2009, depression, Huntington disease, osteoporosis  are also affecting patient's functional outcome.   REHAB POTENTIAL: Good  CLINICAL DECISION MAKING: Evolving/moderate complexity  EVALUATION COMPLEXITY: Moderate  PLAN: PT FREQUENCY:  2 x/week  PT DURATION: other: 4 weeks  PLANNED INTERVENTIONS: Therapeutic exercises, Therapeutic activity, Neuromuscular re-education, Balance training, Gait training, Patient/Family education, Self Care, Joint mobilization, Stair training, Vestibular training, Canalith repositioning, Aquatic Therapy, Dry Needling, Cryotherapy, Moist heat, Taping, Manual therapy, and Re-evaluation  PLAN FOR NEXT SESSION: LTG assessment  3:39 PM, 07/22/22 M. Sherlyn Lees, PT, DPT Physical Therapist- [place] Office Number: [phone]   [place] at [facility] [address], [place], [place] [address] Phone # [phone] Fax # [phone]

## 2022-07-26 NOTE — Therapy (Signed)
OUTPATIENT PHYSICAL THERAPY TREATMENT   Patient Name: Barbara Krause MRN: [id number] DOB:01/25/1941, 81 y.o., female Today's Date: 07/26/2022   PCP: Cody, Jessica R, MD REFERRING PROVIDER: Clark, Katherine K, NP         Past Medical History:  Diagnosis Date   Breast cancer ([facility]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   Depression    History of chicken pox    History of colon polyps    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[facility])    Kidney stone    Osteoporosis    Past Surgical History:  Procedure Laterality Date   CESAREAN SECTION     1977 and 1979   CHOLECYSTECTOMY     TOTAL MASTECTOMY Bilateral 2009   Patient Active Problem List   Diagnosis Date Noted   Acute bilateral low back pain without sciatica 03/22/2022   Recurrent falls 11/26/2020   Closed fracture dislocation of right elbow 11/24/2020   Moderate protein-calorie malnutrition ([facility]) 06/30/2020   Intracerebral hemorrhage ([facility]) 06/15/2020   SAH (subarachnoid hemorrhage) ([facility]) 06/15/2020   Aortic atherosclerosis ([facility]) 08/30/2019   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[facility]) 08/30/2019   Microscopic hematuria 04/02/2019   Chronic fatigue syndrome 04/02/2019   Osteoporosis 04/02/2019   Vitamin B12 deficiency 04/02/2019   Aneurysm of thoracic aorta ([facility]) 04/02/2019   HTN (hypertension) 03/05/2019   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[facility]) 03/05/2019   Tobacco use 03/05/2019   Depression, major, single episode, complete remission ([facility]) 03/05/2019   Hx of breast cancer 03/05/2019   S/P bilateral mastectomy 03/05/2019    ONSET DATE: past several years  REFERRING DIAG: G10 (ICD-10-CM) - 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[facility])  THERAPY DIAG:  No diagnosis found.  Rationale for Evaluation and Treatment Rehabilitation  SUBJECTIVE:                                                                                                                                                                                              SUBJECTIVE STATEMENT: Seems like the Huntington's is getting worse, noticing  increased erratic movements.   Pt accompanied by: caregiver, [name].  PERTINENT HISTORY: breast CA with B mastectomy 2009, depression, Huntington disease, osteoporosis  PAIN:  Are you having pain? No  PRECAUTIONS: Fall and Other: osteoporosis and compression deformities in T-spine   PATIENT GOALS improve strength  OBJECTIVE:     TODAY'S TREATMENT: 07/27/22 Activity Comments                      HOME EXERCISE PROGRAM Last updated: 06/22/22 Access Code: [email or web address] URL: [email or web address] Date: 06/22/2022 Prepared by: [facility] - [facility]patient  Rehab - [facility]  Exercises - Standing Toe Taps  -   1 x daily - 5 x weekly - 2 sets - 20 reps - Alternating Step Backward with Support  - 1 x daily - 5 x weekly - 2 sets - 10 reps - Sit to Stand Without Arm Support  - 1 x daily - 5 x weekly - 2 sets - 10 reps - Standing Hip Abduction with Resistance at Ankles and Counter Support  - 1 x daily - 5 x weekly - 2 sets - 10 reps - Sitting Knee Extension with Resistance  - 1 x daily - 5 x weekly - 2 sets - 10 reps   Below measures were taken at time of initial evaluation unless otherwise specified:  DIAGNOSTIC FINDINGS:  03/22/22 lumbar xray: No acute findings. Multiple compression deformities are identified the within the lumbar spine and lower thoracic spine which appear unchanged when compared with CT of the chest from 02/18/2022.  COGNITION: Overall cognitive status: Impaired   SENSATION: WFL  COORDINATION: Alt pronation/supination: slightly dysmetric on L Finger to nose: intact B Heel to shin: intact B but limited trunk control  MUSCLE TONE: intact B   POSTURE: rounded shoulders, forward head, and increased thoracic kyphosis  LOWER EXTREMITY ROM:     Active  Right Eval Left Eval  Hip flexion    Hip extension    Hip abduction    Hip adduction    Hip internal rotation    Hip external rotation    Knee flexion    Knee extension    Ankle  dorsiflexion 11 15  Ankle plantarflexion    Ankle inversion    Ankle eversion     (Blank rows = not tested)   LOWER EXTREMITY MMT:     MMT (in sitting) Right Eval Left Eval Right 06/29/22 Left 06/29/22  Hip flexion 4 crepitus 4 4 crepitus 4+  Hip extension        Hip abduction _0 Hip adduction 4 4- 4+ 4  Hip internal rotation        Hip external rotation        Knee flexion 4+ 4+ 4+ 4+  Knee extension 4+ crepitus 4+ crepitus 4+ crepitus 4+ crepitus   Ankle dorsiflexion 4+ 4+ 4+ 4+  Ankle plantarflexion 4+ 4+ 4+ 4+  Ankle inversion        Ankle eversion        (Blank rows = not tested)     GAIT: Gait pattern:  R foot vaulting with forward flexed trunk and slow gait speed  Assistive device utilized: Environmental consultant - 2 wheeled Level of assistance: SBA   PATIENT EDUCATION: Education details: prognosis, POC, HEP Person educated: Patient and Child(ren) Education method: Explanation, Demonstration, Tactile cues, Verbal cues, and Handouts Education comprehension: verbalized understanding   HOME EXERCISE PROGRAM: Access Code: [email or web address] URL: [email or web address] Date: 05/20/2022 Prepared by: [facility] - Outpatient  Rehab - [facility]  Exercises - Standing Toe Taps  - 1 x daily - 5 x weekly - 2 sets - 20 reps - Alternating Step Backward with Support  - 1 x daily - 5 x weekly - 2 sets - 10 reps - Sit to Stand Without Arm Support  - 1 x daily - 5 x weekly - 2 sets - 10 reps - Standing Hip Abduction with Resistance at Ankles and Counter Support  - 1 x daily - 5 x weekly - 2 sets - 10 reps   GOALS: Goals reviewed with patient? Yes  SHORT TERM GOALS: Target date:  06/10/2022  Patient to be independent with initial HEP. Baseline: HEP initiated Goal status: MET    LONG TERM GOALS: Target date: 07/27/2022  Patient to be independent with advanced HEP. Baseline: Not yet initiated  Goal status: IN PROGRESS  Patient to demonstrate B LE strength  >/=4+/5.  Baseline: See above; improving 06/29/22 Goal status: IN PROGRESS  Patient to demonstrate improved postural awareness and correction of posture at rest and with activity. Baseline: see above; pt reports awareness while doing her exercises 06/29/22 Goal status: IN PROGRESS  Patient to complete TUG in <18 sec with LRAD in order to decrease risk of falls.   Baseline: 21 sec; 28 sec 06/29/22 Goal status: IN PROGRESS  Patient to demonstrate 5xSTS test in <20 sec in order to decrease risk of falls.  Baseline: 26 sec; 61 sec 06/29/22 Goal status: IN PROGRESS  Patient to score at least 43/56 on Berg in order to decrease risk of falls.  Baseline: 37/56 Goal status: MET 06/29/22  Patient to score at least 45/56 on Berg in order to decrease risk of falls.  Baseline: 37/56; 43/56 06/29/22 Goal status: INITIAL  Patient to demonstrate alternating reciprocal stair navigation with 1 handrail with good stability.  Baseline: NT Goal status: INITIAL   ASSESSMENT:  CLINICAL IMPRESSION: Tx emphasis on motor multi-tasking to improve cognitive control and provide for motor planning and sequencing.  Techniques to facilitate rapid alternating movements to enhance coordination and volitional control.  Able to demo 80% accuracy throughout motor multi-task activities. LTG re-assessment at next session   OBJECTIVE IMPAIRMENTS Abnormal gait, decreased activity tolerance, decreased balance, decreased coordination, difficulty walking, decreased strength, decreased safety awareness, increased muscle spasms, impaired flexibility, improper body mechanics, and postural dysfunction.   ACTIVITY LIMITATIONS carrying, lifting, bending, standing, squatting, stairs, transfers, bathing, toileting, dressing, and reach over head  PARTICIPATION LIMITATIONS: meal prep, community activity, and church  PERSONAL FACTORS Age, Past/current experiences, Transportation, and 3+ comorbidities: breast CA with B mastectomy  2009, depression, Huntington disease, osteoporosis  are also affecting patient's functional outcome.   REHAB POTENTIAL: Good  CLINICAL DECISION MAKING: Evolving/moderate complexity  EVALUATION COMPLEXITY: Moderate  PLAN: PT FREQUENCY:  2 x/week  PT DURATION: other: 4 weeks  PLANNED INTERVENTIONS: Therapeutic exercises, Therapeutic activity, Neuromuscular re-education, Balance training, Gait training, Patient/Family education, Self Care, Joint mobilization, Stair training, Vestibular training, Canalith repositioning, Aquatic Therapy, Dry Needling, Cryotherapy, Moist heat, Taping, Manual therapy, and Re-evaluation  PLAN FOR NEXT SESSION: LTG assessment  11:11 AM, 07/26/22 M. Sherlyn Lees, PT, DPT Physical Therapist- [place] Office Number: [phone]   [place] at [facility] [address] [place], [place] [address] Phone # [phone] Fax # [phone]

## 2022-07-27 ENCOUNTER — Ambulatory Visit: Payer: Medicare Other

## 2022-07-27 ENCOUNTER — Ambulatory Visit: Payer: Medicare Other | Admitting: Physical Therapy

## 2022-07-27 ENCOUNTER — Encounter: Payer: Self-pay | Admitting: Physical Therapy

## 2022-07-27 DIAGNOSIS — R2681 Unsteadiness on feet: Secondary | ICD-10-CM

## 2022-07-27 DIAGNOSIS — R471 Dysarthria and anarthria: Secondary | ICD-10-CM

## 2022-07-27 DIAGNOSIS — R2689 Other abnormalities of gait and mobility: Secondary | ICD-10-CM

## 2022-07-27 DIAGNOSIS — M6281 Muscle weakness (generalized): Secondary | ICD-10-CM

## 2022-07-27 DIAGNOSIS — R41841 Cognitive communication deficit: Secondary | ICD-10-CM

## 2022-07-27 DIAGNOSIS — R1310 Dysphagia, unspecified: Secondary | ICD-10-CM | POA: Diagnosis not present

## 2022-07-27 DIAGNOSIS — R293 Abnormal posture: Secondary | ICD-10-CM

## 2022-07-27 DIAGNOSIS — R29818 Other symptoms and signs involving the nervous system: Secondary | ICD-10-CM

## 2022-07-27 NOTE — Therapy (Unsigned)
OUTPATIENT SPEECH LANGUAGE PATHOLOGY TREATMENT NOTE/DISCHARGE SUMMARY   Patient Name: Barbara Krause MRN: [id number] DOB:December 13, 1940, 81 y.o., female Today's Date: 07/28/2022  PCP: Waunita Schooner, MD REFERRING PROVIDER: Pleas Koch, NP    End of Session - 07/28/22 0920     Visit Number 9    Number of Visits 9    Date for SLP Re-Evaluation 07/30/22    SLP Start Time 35    SLP Stop Time  0867    SLP Time Calculation (min) 32 min    Activity Tolerance Patient tolerated treatment well                  Past Medical History:  Diagnosis Date   Breast cancer ([place]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   Depression    History of chicken pox    History of colon polyps    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[place])    Kidney stone    Osteoporosis    Past Surgical History:  Procedure Laterality Date   CESAREAN SECTION     1977 and [place] Bilateral 2009   Patient Active Problem List   Diagnosis Date Noted   Acute bilateral low back pain without sciatica 03/22/2022   Recurrent falls 11/26/2020   Closed fracture dislocation of right elbow 11/24/2020   Moderate protein-calorie malnutrition ([place]) 06/30/2020   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   SAH (subarachnoid hemorrhage) ([place]) 06/15/2020   Aortic atherosclerosis ([place]) 08/30/2019   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[place]) 08/30/2019   Microscopic hematuria 04/02/2019   Chronic fatigue syndrome 04/02/2019   Osteoporosis 04/02/2019   Vitamin B12 deficiency 04/02/2019   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   Remaining  deficits: Dysphagia, dysarthria, cognitive communication disorder.   Education / Equipment: HEP, compensations for swallowing with greatest possible safety given the fact pt does not want non-oral nutrition, overt s/sx aspiration PNA, speech compensations   Patient agrees to discharge. Patient goals were met. Patient is being discharged due to meeting the stated rehab goals.and being pleased with current functional level .    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 No  PATIENT GOALS  Improve speech clarity  OBJECTIVE:   PT DOES *NOT* DESIRE NON-ORAL FEEDING OPTIONS.    PATIENT REPORTED OUTCOME MEASURES (PROM):   Pt returned the Speech QOL short form and scored 9/40 (lower numbers suggest better QOL).  Pt rated "I had trouble speaking" 2/5 (a little bit), and "I was frustrated by my speech difficulties" a 2/5 (rarely), and "I have ________ saying what I want to say" a 2/5 (a little difficulty). All other responses were 1/5.  TODAY'S TREATMENT:  07/27/22: Pt asked how many more sessions she has scheduled and stated she thought today could be our final session (see "S" statement).  SWALLOWING: SLP assisted pt and aide [name] in problem solving recent difficulty with chips and guacamole. Pt will not have chips/guac from this point on. SLP worked with pt on following her swallow precautions of small bites/small sips, specifically. SLP re-educated on solids bite size no greater than size of  a die, and texture so that it can be mashed. Pt stated she would consider these things as she is choosing her meals.  SPEECH: SLP assisted pt in making her speech as clear as possible. She re-took her PROM and scored higher indicating slightly higher QOL relating to her speech intelligibility. Pt and aide maintain that nobody asks [place] to repeat currently. SLP educated pt/[name] that in the future pt's intelligibility may decline and at that time pt will benefit from open mouth articulation (overarticulation). SLP asked [place] if she wanted to practice this today and she declined the invitation to do so. Anaid expressed gratitude to SLP for the assistance provided over this plan of care.  07/15/22: SLP worked with pt with coughing exercises again today with [name] present. Pt had 5 adequate coughs with 10 trials. SLP told pt/[name] to try pacing every 30 seconds to see if % is different than every 60 seconds or 90 seconds. Pt to do this at least twice a day.Pt with adequate breath size for each trial today. SLP worked with pt with "meds" (tic-tacs) in applesauce as pt has been having more difficulty with AM meds and will begin using applesauce. 3 "meds" and 4 "meds" with applesauce each left one residual that cleared with a level teaspoon of applesauce. SLP educated pt and aide on best liquid to drink to minimize risk of aspiration PNA is water, and best case is to complete good oral care prior to POs in order to minimize risk of aspiration PNA. Overt s/sx aspiration PNA were provided today for pt/aide. Speech: [name] speech 100% intelligible - [name] again states she has no difficulty understanding pt.  07/06/22: Substitute aide with [name] today; SLP did not have her come back to ST session. SLP heard [place] cough with strong/adequate cough, spontaneously, x3 during session (once following sip of water which pt stated she felt abnormal lingual movement prior to her swallow). Throughout session, pt demo'd success with smaller  sip size. SLP reiterated rationale for smaller sips. SLP asked pt for voluntary cough to assess pt's ability at volitional cough following sip. Pt performed WNL/strong and appropriate cough 8 times in 17 attempts. FULL BREATH WAS OBTAINED FOR EACH ATTEMPT - initially with min A but faded to independent for full breath. For this reason, SLP does not think IMT would be necessary as pt's inspiratory cycle is capable of attaining WNL full breath. SLP told pt to practice strong cough at home in hopes this practice will assist pt in strong cough more frequent/prevalent so that eventually pt could produce a higher frequency of strong cough in less attempts.   8/29/23[name] attended session with pt today and SLP reviewed MBS from yesterday, indicating that recommendations were to keep pt the safest possible, and that she would need to make decisions based upon her own feelings, thoughts, and convictions due to her not desiring a PEG tube. See "pt instructions" for details reviewed today with [name] and [name]. Pt to bring food from home and consume with SLP in order to allow  SLP to make suggestions PRN. Pt agreeable to this and extending visits once/week x3-4. Pt intelligible today 100%; [name] again states no difficulty with understanding pt at home.   06/22/22: Today [name] did not bring her phone with her so SLP performed therapy with pt's speech intelligibility in person instead of with the phone. Pt to bring phone next session in order to target phone intelligibility, along with reviewing MBSS and targeting any recommendations from MBSS. SLP explained that pt should cont to practice HEP and focus on over-articulation even if pt is well-understood now because in the future she may not be as precise with her articulation and habitualizing over articulation will make her speech better understood at that time. Today SLP used conversation to target pt's overarticulation - pt used overarticulation 80% of the time with 1 repeat  requested each 10 minute conversation. When requested pt overarticulated and intelligibility incr'd to 100%. Pt rea'd SBA to complete HEP.   06/15/22: See pt's therapy note in "chart review" for clinical swallow evaluation last session. Britiney endorses coughing average <1 time/dinner, but only consumes one  meal/day - dinner. She has 3 smaller-size muffins with coffee for breakfast, a Boost at 1pm and one at 3pm, and then her dinner. She denies coughing with breakfast or with Boosts during the day.  It was ascertained by SLP today that Roselie does not do much talking during the week. One short conversation with her sister/week, and average once/week conversation with each of her daughters. Adelyne said she and [name] talk mostly when they watch TV together for 30-60 minutes Mon-Fri, and [name] agreed with pt that she does not talk much at home. SLP asked pt if she wanted to cont to target talking and pt stated she would like to cont. SLP reminded pt that she may find it more challenging to verbally communicate and it would be good to practice overarticulation now so it's easier later. Pt also, when questioned by SLP, denied there were social or speaking situations she would participate more in if she could speak clearer. SLP is unsure of feasibility of speech compensation carryover to outside ST room due to pt's limited talking time. Pt spoke with incr'd articulatory movements when reading conversational sentences and when reading 8-9 word sentences however intelligibility dipped from 90% to 70% due to longer utterance length. SLP educated pt and [name] that pt may find it beneficial to make verbal output "short and sweet" as time progresses because longer verbal utterances have greater possibility of being misunderstood. [name] agreed. SLP may do more therapy for intelligiblity via phone as that is more difficult situation for pt, according to daughter, and a mode pt frequently uses to communicate with others at this  time.  06/08/22:  Pt did not follow precautions from previous MBSS three years ago. Suspect pt's memory deficit negatively impacted her ability to do this.  SLP recommended precautions delineated in "Pt instructions": small bites/sips, double swallow, swallow solids prior to taking sip liquid - exception is to take 2-3 drops if pt needs moisture to swallow effectively.  [name] last MBSS was December 2020.  SLP discussed swallow precautions (as above, and in "pt instructions") and need for modified (MBSS). Pt agrees with suggestion of MBSS. [name], pt's aide was present to hear this and indicated she would assist pt in following these precautions. SLP to contact pt's referring MD/PCP and request order/script faxed to [phone]. Pt prefers this be completed at [facility] SLP explained that these precautions provided  today may change after the swallow test at [facility]. Pt demonstrated understanding.     PATIENT EDUCATION: Education details: see above in "today's treatment" Person educated: Patient and Caregiver [name] Education method: Explanation, Demonstration, Verbal cues, Handouts, and  modeling Education comprehension: verbalized understanding, returned demonstration, and needs further education    GOALS: Goals reviewed with patient? Yes  SHORT TERM GOALS: Target date: 06/22/2022    Pt will complete PROM in first 2 therapy sessions Baseline: Goal status: Met  2.  Pt will complete HEP for speech clarity compensations with rare min A in 2 sessions  Baseline: 06-15-22 Goal status: met  3.  Pt will demo speech compensations functionally in 10 minutes simple conversation  Baseline:  Goal status: Met  4.  Pt will participate in clinical swallow assessment with food from home, and participate in objective swallow eval if necessary Baseline:  Goal status: Met  5.  Pt will demo swallow precautions with food in 2 therapy sessions with modified independence, after MBSS Baseline:   Goal status: Deferred due to timing of MBS   LONG TERM GOALS: Target date: 07/30/2022    Pt/caregiver will report that memory strategies pt engages in cont to improve pt's overall memory, between 3 sessions Baseline:  Goal status: Deferred  2.  Pt will complete HEP for speech clarity compensations with modified independence/assistance from aide/s in 2 sessions Baseline: 07/06/22 Goal status: Met  3.  Pt will demo speech compensations functionally in 10 minutes simple conversation over three sessions Baseline: 06-29-22, 07-15-22 Goal status: Met  4.  [name] score on PROM for communication will improve when compared to initial score Baseline:  Goal status: Met  5.  Pt will demo swallow precautions with food in 2 therapy sessions with modified independence, after MBSS Baseline: 07/06/22 Goal status: Met  ASSESSMENT:  CLINICAL IMPRESSION: Nahla met all LTGs. Salisha maintains that her speech is functional at this time. Patient is a 81 y.o. female who was seen today primarily for education about compensations and treatment of dysphagia. See "imaging" section for details of MBS 06/28/22. Laysha thinks that she is appropriate for d/c today - SLP agrees.  OBJECTIVE IMPAIRMENTS include memory, dysarthria, and dysphagia. These impairments are limiting patient from household responsibilities, ADLs/IADLs, effectively communicating at home and in community, and safety when swallowing. Factors affecting potential to achieve goals and functional outcome are co-morbidities and medical prognosis. Patient will benefit from skilled SLP services to address above impairments and improve overall function.  REHAB POTENTIAL: Good  PLAN: Discharge today.  PLANNED INTERVENTIONS: Aspiration precaution training, Pharyngeal strengthening exercises, Diet toleration management , Environmental controls, Trials of upgraded texture/liquids, Internal/external aids, Oral motor tasks, and Functional tasks and memory  compensations    [place], [place] 07/28/2022, 9:20 AM

## 2022-07-29 ENCOUNTER — Ambulatory Visit: Payer: Medicare Other | Admitting: Physical Therapy

## 2022-08-03 ENCOUNTER — Encounter: Payer: Self-pay | Admitting: Family Medicine

## 2022-08-03 ENCOUNTER — Ambulatory Visit (INDEPENDENT_AMBULATORY_CARE_PROVIDER_SITE_OTHER): Payer: Medicare Other | Admitting: Family Medicine

## 2022-08-03 VITALS — BP 110/80 | HR 78 | Temp 98.7°F | Ht 66.0 in | Wt 131.0 lb

## 2022-08-03 DIAGNOSIS — E7800 Pure hypercholesterolemia, unspecified: Secondary | ICD-10-CM | POA: Insufficient documentation

## 2022-08-03 DIAGNOSIS — Z66 Do not resuscitate: Secondary | ICD-10-CM

## 2022-08-03 DIAGNOSIS — Z853 Personal history of malignant neoplasm of breast: Secondary | ICD-10-CM

## 2022-08-03 DIAGNOSIS — I712 Thoracic aortic aneurysm, without rupture, unspecified: Secondary | ICD-10-CM

## 2022-08-03 DIAGNOSIS — Z23 Encounter for immunization: Secondary | ICD-10-CM

## 2022-08-03 DIAGNOSIS — F0390 Unspecified dementia without behavioral disturbance: Secondary | ICD-10-CM | POA: Insufficient documentation

## 2022-08-03 DIAGNOSIS — J439 Emphysema, unspecified: Secondary | ICD-10-CM

## 2022-08-03 DIAGNOSIS — R3129 Other microscopic hematuria: Secondary | ICD-10-CM

## 2022-08-03 DIAGNOSIS — F02A0 Dementia in other diseases classified elsewhere, mild, without behavioral disturbance, psychotic disturbance, mood disturbance, and anxiety: Secondary | ICD-10-CM

## 2022-08-03 DIAGNOSIS — M810 Age-related osteoporosis without current pathological fracture: Secondary | ICD-10-CM | POA: Diagnosis not present

## 2022-08-03 DIAGNOSIS — R1310 Dysphagia, unspecified: Secondary | ICD-10-CM | POA: Diagnosis not present

## 2022-08-03 DIAGNOSIS — E440 Moderate protein-calorie malnutrition: Secondary | ICD-10-CM

## 2022-08-03 DIAGNOSIS — I2510 Atherosclerotic heart disease of native coronary artery without angina pectoris: Secondary | ICD-10-CM | POA: Diagnosis not present

## 2022-08-03 DIAGNOSIS — I700 Atherosclerosis of aorta: Secondary | ICD-10-CM

## 2022-08-03 DIAGNOSIS — S32010D Wedge compression fracture of first lumbar vertebra, subsequent encounter for fracture with routine healing: Secondary | ICD-10-CM

## 2022-08-03 DIAGNOSIS — F325 Major depressive disorder, single episode, in full remission: Secondary | ICD-10-CM

## 2022-08-03 DIAGNOSIS — S22080D Wedge compression fracture of T11-T12 vertebra, subsequent encounter for fracture with routine healing: Secondary | ICD-10-CM

## 2022-08-03 DIAGNOSIS — G10 Huntington's disease: Secondary | ICD-10-CM | POA: Diagnosis not present

## 2022-08-03 DIAGNOSIS — Z9189 Other specified personal risk factors, not elsewhere classified: Secondary | ICD-10-CM

## 2022-08-03 DIAGNOSIS — Z720 Tobacco use: Secondary | ICD-10-CM

## 2022-08-03 DIAGNOSIS — E538 Deficiency of other specified B group vitamins: Secondary | ICD-10-CM

## 2022-08-03 NOTE — Assessment & Plan Note (Signed)
Improving with Boost twice daily

## 2022-08-03 NOTE — Assessment & Plan Note (Signed)
Chronic, stable control with Lexapro 20 mg daily.

## 2022-08-03 NOTE — Patient Instructions (Signed)
I will set up the referral to [facility].

## 2022-08-03 NOTE — Assessment & Plan Note (Signed)
She is spoken in detail with her family regarding this.  She would like to do comfort care only.  She does not want aggressive work-up of incidental findings. She is open to a referral to palliative care to help guide her care and to help with support both her and for family.

## 2022-08-03 NOTE — Assessment & Plan Note (Signed)
Eval by [facility] work up has been unremarkable. Non obstructing kindey stone

## 2022-08-03 NOTE — Progress Notes (Signed)
Patient ID: Barbara Krause, female    DOB: [birth year], 80 y.o.   MRN: [id number]  This visit was conducted in person.  BP 110/80   Pulse 78   Temp 98.7 F (37.1 C) (Oral)   Ht '5\' 6"'$  (1.676 m)   Wt 131 lb (59.4 kg)   SpO2 94%   BMI 21.14 kg/m    CC: Subjective:   HPI: Barbara Krause is a 81 y.o. female presenting on 08/03/2022 for Establish Care (TOC from Dr. 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 Nicki Reaper at [facility].. twice  a year.  Difficulty walking, frequent falls and balance. Effecting speech.  Hx of head injury after fall 2020-2021.. resulting in [place]... stable now... effected speech, word finding  After this she had significant set back.. now cannot finance She has [place] aide.. daily for 4 hours... helps with errands and  cooking.  Does ADLs for the most part, family pre-packages meals. (  Able to do ADLs: Bathing, toileting,  feeding s)  Lives alone.  Some issues with aspiration per swallowing study... even with nectar thick foods.  Daughter is interested in setting up palliative care consult. She has DNR, Has MOST form completed. HCPOA [name]  [place]  Dementia on aricept 5 mg dialy  Daughter lives in [place]  Sister:  [name]  MDD:  Stable PHQ 9: 7 lexapro 20 mg daily  Hypertension:   Well controlled on cozaar 25 mg daily BP Readings from Last 3 Encounters:  08/03/22 110/80  04/21/22 117/81  03/22/22 98/62   Using medication without problems or lightheadedness:  none Chest pain with exertion: none Edema:none Short of breath: Average home BPs: Other issues:   She currently smoking 1 pack per day for 70 years... Has emphysema  No SOB, occ wheeze, daily smokers cough.   Wt Readings from Last 3 Encounters:  08/03/22 131 lb (59.4 kg)  04/21/22 130 lb (59 kg)  03/22/22 130 lb (59 kg)    Relevant past  medical, surgical, family and social history reviewed and updated as indicated. Interim medical history since our last visit reviewed. Allergies and medications reviewed and updated. Outpatient Medications Prior to Visit  Medication Sig Dispense Refill   alendronate (FOSAMAX) 70 MG tablet Take 1 tablet (70 mg total) by mouth every 7 (seven) days. Take with a full glass of water on an empty stomach. 12 tablet 3   atorvastatin (LIPITOR) 10 MG tablet TAKE 1 TABLET BY MOUTH EVERY DAY 90 tablet 3   AUSTEDO 12 MG TABS Take 2 tablets by mouth 2 (two) times daily.     donepezil (ARICEPT) 5 MG tablet TAKE 1 TABLET (5 MG TOTAL) BY MOUTH DAILY. 90 tablet 1   escitalopram (LEXAPRO) 20 MG tablet TAKE 1 TABLET BY MOUTH EVERY DAY 90 tablet 3   losartan (COZAAR) 25 MG tablet Take 1 tablet (25 mg total) by mouth daily. 30 tablet 8   meloxicam (MOBIC) 7.5 MG tablet Take 1 tablet (7.5 mg total) by mouth daily. (Patient not taking: Reported on 05/20/2022) 30 tablet 0   Vitamin D, Ergocalciferol, (DRISDOL) 1.25 MG (50000 UNIT) CAPS capsule TAKE 1 CAPSULE BY MOUTH EVERY 7 DAYS. DUE FOR FOLLOW UP IN NOVEMBER. PLEASE SCHEDULE (Patient not taking: Reported on 05/20/2022) 12 capsule 3   No facility-administered medications prior to visit.  Per HPI unless specifically indicated in ROS section below Review of Systems  Constitutional:  Positive for fatigue. Negative for fever.  HENT:  Negative for congestion.   Eyes:  Negative for pain.  Respiratory:  Negative for cough and shortness of breath.   Cardiovascular:  Negative for chest pain, palpitations and leg swelling.  Gastrointestinal:  Negative for abdominal pain.  Genitourinary:  Negative for dysuria and vaginal bleeding.  Musculoskeletal:  Negative for back pain.  Neurological:  Negative for syncope, light-headedness and headaches.  Psychiatric/Behavioral:  Negative for dysphoric mood.    Objective:  BP 110/80   Pulse 78   Temp 98.7 F (37.1 C) (Oral)   Ht  '5\' 6"'$  (1.676 m)   Wt 131 lb (59.4 kg)   SpO2 94%   BMI 21.14 kg/m   Wt Readings from Last 3 Encounters:  08/03/22 131 lb (59.4 kg)  04/21/22 130 lb (59 kg)  03/22/22 130 lb (59 kg)      Physical Exam Vitals and nursing note reviewed.  Constitutional:      General: She is not in acute distress.    Appearance: Normal appearance. She is well-developed. She is not ill-appearing or toxic-appearing.  HENT:     Head: Normocephalic.     Right Ear: Hearing, tympanic membrane, ear canal and external ear normal.     Left Ear: Hearing, tympanic membrane, ear canal and external ear normal.     Nose: Nose normal.  Eyes:     General: Lids are normal. Lids are everted, no foreign bodies appreciated.     Conjunctiva/sclera: Conjunctivae normal.     Pupils: Pupils are equal, round, and reactive to light.  Neck:     Thyroid: No thyroid mass or thyromegaly.     Vascular: No carotid bruit.     Trachea: Trachea normal.  Cardiovascular:     Rate and Rhythm: Normal rate and regular rhythm.     Heart sounds: Normal heart sounds, S1 normal and S2 normal. No murmur heard.    No gallop.  Pulmonary:     Effort: Pulmonary effort is normal. No respiratory distress.     Breath sounds: Normal breath sounds. No wheezing, rhonchi or rales.  Abdominal:     General: Bowel sounds are normal. There is no distension or abdominal bruit.     Palpations: Abdomen is soft. There is no fluid wave or mass.     Tenderness: There is no abdominal tenderness. There is no guarding or rebound.     Hernia: No hernia is present.  Musculoskeletal:     Cervical back: Normal range of motion and neck supple.  Lymphadenopathy:     Cervical: No cervical adenopathy.  Skin:    General: Skin is warm and dry.     Findings: No rash.  Neurological:     Mental Status: She is alert and oriented to person, place, and time.     Cranial Nerves: No cranial nerve deficit.     Sensory: No sensory deficit.     Motor: Abnormal muscle tone  present.     Coordination: Coordination abnormal.     Gait: Gait abnormal.  Psychiatric:        Mood and Affect: Mood is not anxious or depressed.        Speech: Speech normal.        Behavior: Behavior normal. Behavior is cooperative.        Cognition and Memory: Cognition is not impaired. Memory is impaired.  Judgment: Judgment normal.       Results for orders placed or performed in visit on 03/03/22  Vitamin D, 25-hydroxy  Result Value Ref Range   VITD 72.70 30.00 - 100.00 ng/mL     COVID 19 screen:  No recent travel or known exposure to [place] The patient denies respiratory symptoms of COVID 19 at this time. The importance of social distancing was discussed today.   Assessment and Plan Problem List Items Addressed This Visit     Aortic atherosclerosis ([place])    On statin medication      At risk for aspiration   Relevant Orders   Amb Referral to Palliative Care   Compression fracture of thoracolumbar vertebra with routine healing    Chronic finding on past CT.  No current symptoms.  Osteoporosis is currently being treated with Fosamax.      Coronary atherosclerosis    She is currently on atorvastatin 10 mg daily with excellent control of her cholesterol.  She is currently asymptomatic but she and her daughter would like her to continue statin given increased risk of heart attack and stroke.      Dementia ([place])    Associated with Huntington's disease On Aricept 5 mg daily      Depression, major, single episode, complete remission (HCC)    Chronic, stable control with Lexapro 20 mg daily.      DNR (do not resuscitate)    She is spoken in detail with her family regarding this.  She would like to do comfort care only.  She does not want aggressive work-up of incidental findings. She is open to a referral to palliative care to help guide her care and to help with support both her and for family.      Relevant Orders   Amb Referral to Palliative Care    Dysphagia - Primary    Huntington's disease is progressing and she now has issues with swallowing resulting in increased risk for aspiration on any food thickness level..  We discussed today the increased risk of complications.      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 Nicki Reaper at [facility] twice  a year.  Difficulty walking, frequent falls and balance.  extra movements worsening. Trouble swallowing  Uses walker for stability.      Hx of breast cancer    Per outside charts: s/p lumpectomy followed by b/l mastectomy, s/p Chemo, radiation and femara x 5 years 2009-2013      Microscopic hematuria     Eval by URO work up has been unremarkable. Non obstructing kindey stone      Moderate protein-calorie malnutrition (HCC)     Improving with Boost twice daily      Osteoporosis     On fosamax  since 12/2019      Thoracic aortic aneurysm (TAA) ([place])    This appears stable from 2021 until now on recent April 2023 chest CT.  It appears stable from 4 cm to 4.2 cm. She is not interested in continuing to screen for changes in this as she would not do cardiothoracic surgery given her other health issues      Tobacco use    She is precontemplative and does not want to quit smoking.  She is not interested  in continuing yearly lung cancer screening evaluation given her other health issues and her desire to not do treatment if lung cancer is found.      Vitamin B12 deficiency    Reevaluate at next lab draw with physical.      Other Visit Diagnoses     Need for influenza vaccination       Relevant Orders   Flu Vaccine QUAD High Dose(Fluad) (Completed)      Orders Placed This Encounter  Procedures   Flu Vaccine QUAD High Dose(Fluad)   Amb Referral to Palliative Care    Referral Priority:   Routine    Referral  Type:   Consultation    Number of Visits Requested:   1       Eliezer Lofts, MD

## 2022-08-03 NOTE — Assessment & Plan Note (Addendum)
Dx 10 years ago, followed by Dr. Nicki Reaper at [facility].. twice  a year.  Difficulty walking, frequent falls and balance.  extra movements worsening. Trouble swallowing  Uses walker for stability.

## 2022-08-03 NOTE — Assessment & Plan Note (Signed)
Huntington's disease is progressing and she now has issues with swallowing resulting in increased risk for aspiration on any food thickness level..  We discussed today the increased risk of complications.

## 2022-08-03 NOTE — Assessment & Plan Note (Signed)
This appears stable from 2021 until now on recent April 2023 chest CT.  It appears stable from 4 cm to 4.2 cm. She is not interested in continuing to screen for changes in this as she would not do cardiothoracic surgery given her other health issues

## 2022-08-03 NOTE — Assessment & Plan Note (Signed)
She is precontemplative and does not want to quit smoking.  She is not interested in continuing yearly lung cancer screening evaluation given her other health issues and her desire to not do treatment if lung cancer is found.

## 2022-08-03 NOTE — Assessment & Plan Note (Signed)
Reevaluate at next lab draw with physical.

## 2022-08-03 NOTE — Assessment & Plan Note (Signed)
Associated with Huntington's disease On Aricept 5 mg daily

## 2022-08-03 NOTE — Assessment & Plan Note (Signed)
On fosamax  since 12/2019

## 2022-08-03 NOTE — Assessment & Plan Note (Signed)
She is currently on atorvastatin 10 mg daily with excellent control of her cholesterol.  She is currently asymptomatic but she and her daughter would like her to continue statin given increased risk of heart attack and stroke.

## 2022-08-03 NOTE — Assessment & Plan Note (Signed)
On statin medication

## 2022-08-03 NOTE — Assessment & Plan Note (Signed)
Chronic, asymptomatic Noted on lung CT yearly cancer screening.

## 2022-08-03 NOTE — Assessment & Plan Note (Signed)
Per outside charts: s/p lumpectomy followed by b/l mastectomy, s/p Chemo, radiation and femara x 5 years 2009-2013

## 2022-08-03 NOTE — Assessment & Plan Note (Signed)
Chronic finding on past CT.  No current symptoms.  Osteoporosis is currently being treated with Fosamax.

## 2022-08-05 ENCOUNTER — Telehealth: Payer: Self-pay

## 2022-08-05 ENCOUNTER — Encounter: Payer: Self-pay | Admitting: Physical Therapy

## 2022-08-05 ENCOUNTER — Ambulatory Visit: Payer: Medicare Other | Attending: Primary Care | Admitting: Physical Therapy

## 2022-08-05 DIAGNOSIS — R2689 Other abnormalities of gait and mobility: Secondary | ICD-10-CM | POA: Diagnosis present

## 2022-08-05 DIAGNOSIS — M6281 Muscle weakness (generalized): Secondary | ICD-10-CM | POA: Diagnosis present

## 2022-08-05 DIAGNOSIS — R293 Abnormal posture: Secondary | ICD-10-CM | POA: Insufficient documentation

## 2022-08-05 DIAGNOSIS — R29818 Other symptoms and signs involving the nervous system: Secondary | ICD-10-CM | POA: Insufficient documentation

## 2022-08-05 DIAGNOSIS — R2681 Unsteadiness on feet: Secondary | ICD-10-CM | POA: Insufficient documentation

## 2022-08-05 NOTE — Therapy (Signed)
OUTPATIENT PHYSICAL THERAPY TREATMENT NOTE   Patient Name: Barbara Krause MRN: [id number] DOB:August 08, 1941, 81 y.o., female Today's Date: 08/05/2022   PCP: [name], MD REFERRING PROVIDER: Pleas Koch, NP    PT End of Session - 08/05/22 1448     Visit Number 17    Number of Visits 24    Date for PT Re-Evaluation 09/07/22    Authorization Type Medicare/BCBS    Progress Note Due on Visit 50    PT Start Time 1448    PT Stop Time 7209    PT Time Calculation (min) 42 min    Equipment Utilized During Treatment Gait belt    Activity Tolerance Patient tolerated treatment well    Behavior During Therapy WFL for tasks assessed/performed                  Past Medical History:  Diagnosis Date   Breast cancer ([place]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   Depression    History of chicken pox    History of colon polyps    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[place])    Kidney stone    Osteoporosis    Past Surgical History:  Procedure Laterality Date   CESAREAN SECTION     1977 and [place] Bilateral 2009   Patient Active Problem List   Diagnosis Date Noted   Coronary atherosclerosis 08/03/2022   Compression fracture of thoracolumbar vertebra with routine healing 08/03/2022   DNR (do not resuscitate) 08/03/2022   At risk for aspiration 08/03/2022   Dysphagia 08/03/2022   High cholesterol 08/03/2022   Dementia ([place]) 08/03/2022   Recurrent falls 11/26/2020   Moderate protein-calorie malnutrition ([place]) 06/30/2020   History of intracranial hemorrhage 06/15/2020   Aortic atherosclerosis ([place]) 08/30/2019   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[place]) 08/30/2019   Microscopic hematuria 04/02/2019   Osteoporosis 04/02/2019   Vitamin B12 deficiency 04/02/2019   Thoracic aortic aneurysm (TAA) ([place]) 04/02/2019   HTN (hypertension) 03/05/2019   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[place]) 03/05/2019   Tobacco use 03/05/2019   Depression, major, single episode, complete remission ([place]) 03/05/2019   Hx  of breast cancer 03/05/2019   S/P bilateral mastectomy 03/05/2019    ONSET DATE: past several years  REFERRING DIAG: G10 (ICD-10-CM) - 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[place])  THERAPY DIAG:  Unsteadiness on feet  Other abnormalities of gait and mobility  Rationale for Evaluation and Treatment Rehabilitation  SUBJECTIVE:  SUBJECTIVE STATEMENT: Want to keep working on getting stronger.  My movements with Huntington's are getting worse.  Pt accompanied by: caregiver, [name].  PERTINENT HISTORY: breast CA with B mastectomy 2009, depression, Huntington disease, osteoporosis  PAIN:  Are you having pain? No  PRECAUTIONS: Fall and Other: osteoporosis and compression deformities in T-spine   PATIENT GOALS improve strength  OBJECTIVE:     TODAY'S TREATMENT: 08/05/2022 Activity Comments  Nustep L4 x 7 min 4 extremities  Cues to keep SPM>50  Heel/toe raises 10 reps 4# weight For ankle strategy work  Side step and weightshift, over obstacle, 10 reps 4# weight   Forward step and weightshift over obstacle, 10 reps, 4# weight; forward step over, each leg, then each leg return to start position x 5 reps   Back step and weightshift, 10 reps, 4# weight   Wall bumps-hip>shoulder bumps then reverse shoulder>hip to return to stand at walker, 10 reps    PATIENT EDUCATION: Education details: looking into options for seated under desk elliptical for lower extremity strengthening machine at home; discussed initial tips to reduce the getting stuck with walking:  stop with best posture, rock through hips to relax and start again with big confident steps Person educated: Patient and Caregiver [name] Education method: Explanation and Handouts Education comprehension: verbalized understanding   TODAY'S TREATMENT: 07/27/22 Activity  Comments  Nustep L4 x 5 min Ues/Les  Maintaining 50s SPM  Stair navigation x4 1 handrail, alt reciprocal pattern ascending, step-to descending (safer with R foot down first)     LOWER EXTREMITY MMT:     MMT (in sitting) Right Eval Left Eval Right 06/29/22 Left 06/29/22 Right 07/27/22 Left 07/27/22  Hip flexion 4 crepitus 4 4 crepitus 4+ 4 4+  Hip extension          Hip abduction 4 4 4 4  4+ 4+  Hip adduction 4 4- 4+ 4 4+ 4  Hip internal rotation          Hip external rotation          Knee flexion 4+ 4+ 4+ 4+ 4+ 4  Knee extension 4+ crepitus 4+ crepitus 4+ crepitus 4+ crepitus  4 4+  Ankle dorsiflexion 4+ 4+ 4+ 4+ 4+ 4+  Ankle plantarflexion 4+ 4+ 4+ 4+ 4+ 4+  Ankle inversion          Ankle eversion          (Blank rows = not tested)         PATIENT EDUCATION: Education details: edu on exam findings and remaining deficits Person educated: Patient and Caregiver [name] Education method: Explanation, Demonstration, Tactile cues, and Verbal cues Education comprehension: verbalized understanding    HOME EXERCISE PROGRAM Last updated: 06/22/22 Access Code: [email or web address] URL: [email or web address] Date: 06/22/2022 Prepared by: [facility]  Exercises - Standing Toe Taps  - 1 x daily - 5 x weekly - 2 sets - 20 reps - Alternating Step Backward with Support  - 1 x daily - 5 x weekly - 2 sets - 10 reps - Sit to Stand Without Arm Support  - 1 x daily - 5 x weekly - 2 sets - 10 reps - Standing Hip Abduction with Resistance at Ankles and Counter Support  - 1 x daily - 5 x weekly - 2 sets - 10 reps - Sitting Knee Extension with Resistance  - 1 x daily - 5 x weekly - 2 sets - 10 reps   Below measures  were taken at time of initial evaluation unless otherwise specified:  DIAGNOSTIC FINDINGS:  03/22/22 lumbar xray: No acute findings. Multiple compression deformities are identified the within the lumbar spine and lower thoracic spine  which appear unchanged when compared with CT of the chest from 02/18/2022.  COGNITION: Overall cognitive status: Impaired   SENSATION: WFL  COORDINATION: Alt pronation/supination: slightly dysmetric on L Finger to nose: intact B Heel to shin: intact B but limited trunk control  MUSCLE TONE: intact B   POSTURE: rounded shoulders, forward head, and increased thoracic kyphosis  LOWER EXTREMITY ROM:     Active  Right Eval Left Eval  Hip flexion    Hip extension    Hip abduction    Hip adduction    Hip internal rotation    Hip external rotation    Knee flexion    Knee extension    Ankle dorsiflexion 11 15  Ankle plantarflexion    Ankle inversion    Ankle eversion     (Blank rows = not tested)   LOWER EXTREMITY MMT:     MMT (in sitting) Right Eval Left Eval Right 06/29/22 Left 06/29/22  Hip flexion 4 crepitus 4 4 crepitus 4+  Hip extension        Hip abduction 4 4 4 4   Hip adduction 4 4- 4+ 4  Hip internal rotation        Hip external rotation        Knee flexion 4+ 4+ 4+ 4+  Knee extension 4+ crepitus 4+ crepitus 4+ crepitus 4+ crepitus   Ankle dorsiflexion 4+ 4+ 4+ 4+  Ankle plantarflexion 4+ 4+ 4+ 4+  Ankle inversion        Ankle eversion        (Blank rows = not tested)     GAIT: Gait pattern:  R foot vaulting with forward flexed trunk and slow gait speed  Assistive device utilized: Environmental consultant - 2 wheeled Level of assistance: SBA   PATIENT EDUCATION: Education details: prognosis, POC, HEP Person educated: Patient and Child(ren) Education method: Explanation, Demonstration, Tactile cues, Verbal cues, and Handouts Education comprehension: verbalized understanding   HOME EXERCISE PROGRAM: Access Code: [email or web address] URL: [email or web address] Date: 05/20/2022 Prepared by: [facility] Outpatient  Re[facility] - [facility]  Exercises - Standing Toe Taps  - 1 x daily - 5 x weekly - 2 sets - 20 reps - Alternating Step Backward with Support   - 1 x daily - 5 x weekly - 2 sets - 10 reps - Sit to Stand Without Arm Support  - 1 x daily - 5 x weekly - 2 sets - 10 reps - Standing Hip Abduction with Resistance at Ankles and Counter Support  - 1 x daily - 5 x weekly - 2 sets - 10 reps   GOALS: Goals reviewed with patient? Yes  SHORT TERM GOALS: Target date: 06/10/2022  Patient to be independent with initial HEP. Baseline: HEP initiated Goal status: MET    LONG TERM GOALS: Target date: 09/07/2022  Patient to be independent with advanced HEP. Baseline: Not yet initiated; met for current 07/27/22 Goal status: IN PROGRESS 07/27/22  Patient to demonstrate B LE strength >/=4+/5.  Baseline: See above; improving 06/29/22, improvement in B hip ABD 07/27/22 Goal status: IN PROGRESS 07/27/22  Patient to demonstrate improved postural awareness and correction of posture at rest and with activity. Baseline: see above; pt reports awareness while doing her exercises 06/29/22; pt reports improved awareness with ambulation 07/27/22 Goal status:  MET 07/27/22  Patient to complete TUG in <18 sec with LRAD in order to decrease risk of falls.   Baseline: 21 sec; 28 sec 06/29/22, 30.94 sec 07/27/22 Goal status: IN PROGRESS 07/27/22  Patient to demonstrate 5xSTS test in <20 sec in order to decrease risk of falls.  Baseline: 26 sec; 61 sec 06/29/22, 21.38 sec 07/27/22 Goal status: IN PROGRESS  Patient to score at least 43/56 on [place] in order to decrease risk of falls.  Baseline: 37/56 Goal status: MET 06/29/22  Patient to score at least 45/56 on [place] in order to decrease risk of falls.  Baseline: 37/56; 43/56 06/29/22, 38/56 07/27/22 Goal status: IN PROGRESS 07/27/22  Patient to demonstrate alternating reciprocal stair navigation with 1 handrail with good stability.  Baseline: demonstrating step-to pattern descending with CGA-min A d/t instability and poor eccentric control  Goal status: IN PROGRESS 07/27/22   ASSESSMENT:  CLINICAL  IMPRESSION: Worked in skilled PT session today on ankle, hip, step strategies for functional strength and balance.  With specific tasks, she is able to perform each strategy, but she does tend to speed up towards end of hip strategy work at wall.  Discussed options for working on these exercises at home and pt doesn't want to add any exercises at this time.  Discussed and practiced tips to reduce hesitation with gait; pt responds well to cues for stop, rock through hips, and take big step to start again.  She will benefit from additional practice with these strategies.  OBJECTIVE IMPAIRMENTS Abnormal gait, decreased activity tolerance, decreased balance, decreased coordination, difficulty walking, decreased strength, decreased safety awareness, increased muscle spasms, impaired flexibility, improper body mechanics, and postural dysfunction.   ACTIVITY LIMITATIONS carrying, lifting, bending, standing, squatting, stairs, transfers, bathing, toileting, dressing, and reach over head  PARTICIPATION LIMITATIONS: meal prep, community activity, and church  PERSONAL FACTORS Age, Past/current experiences, Transportation, and 3+ comorbidities: breast CA with B mastectomy 2009, depression, Huntington disease, osteoporosis  are also affecting patient's functional outcome.   REHAB POTENTIAL: Good  CLINICAL DECISION MAKING: Evolving/moderate complexity  EVALUATION COMPLEXITY: Moderate  PLAN: PT FREQUENCY:  2 x/week  PT DURATION: other: 4 weeks  PLANNED INTERVENTIONS: Therapeutic exercises, Therapeutic activity, Neuromuscular re-education, Balance training, Gait training, Patient/Family education, Self Care, Joint mobilization, Stair training, Vestibular training, Canalith repositioning, Aquatic Therapy, Dry Needling, Cryotherapy, Moist heat, Taping, Manual therapy, and Re-evaluation  PLAN FOR NEXT SESSION: Continue edu on and practice using techniques to avoid freezing, practice ankle-hip-reaching  strategies, floor transfers, update HEP with new exercises    Mady Haagensen, PT 08/05/22 3:41 PM Phone: [phone] Fax: [place] [facility] [address] [place], [place] [address] Phone # [phone] Fax # [phone]

## 2022-08-05 NOTE — Telephone Encounter (Signed)
Attempted to contact patient to schedule a Palliative Care consult appointment. No answer left a message to return call.  

## 2022-08-05 NOTE — Patient Instructions (Signed)
Look into the possiblity of an under the desk stepper or under the desk elliptical (such as [place])  -You could find these online or you could try at the local store, [employer]

## 2022-08-09 ENCOUNTER — Telehealth: Payer: Self-pay

## 2022-08-09 NOTE — Telephone Encounter (Signed)
Attempted to contact patient to schedule a Palliative Care consult appointment. No answer left a message to return call.  

## 2022-08-09 NOTE — Therapy (Signed)
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[place])    Kidney stone    Osteoporosis    Past Surgical History:  Procedure Laterality Date   CESAREAN SECTION     1977 and [place] Bilateral 2009   Patient Active Problem List   Diagnosis Date Noted   Coronary atherosclerosis 08/03/2022   Compression fracture of thoracolumbar vertebra with routine healing 08/03/2022   DNR (do not resuscitate) 08/03/2022   At risk for aspiration 08/03/2022   Dysphagia 08/03/2022   High cholesterol 08/03/2022   Dementia ([place]) 08/03/2022   Recurrent falls 11/26/2020   Moderate protein-calorie malnutrition ([place]) 06/30/2020   History of intracranial hemorrhage 06/15/2020   Aortic atherosclerosis ([place]) 08/30/2019   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  Rationale for Evaluation and Treatment Rehabilitation  SUBJECTIVE:                                                                                                                                                                                               SUBJECTIVE STATEMENT: Want to keep working on getting stronger.  My movements with Huntington's are getting worse.  Pt accompanied by: caregiver, [name].  PERTINENT HISTORY: breast CA with B mastectomy 2009, depression, Huntington disease, osteoporosis  PAIN:  Are you having pain? No  PRECAUTIONS: Fall and Other: osteoporosis and compression deformities in T-spine   PATIENT GOALS improve strength  OBJECTIVE:     TODAY'S TREATMENT: 08/10/22 Activity Comments                        HOME EXERCISE PROGRAM Last updated: 06/22/22 Access Code: [email or web address] URL: [email or web address]  Date: 06/22/2022 Prepared by: [facility] - Outpatient  Rehab - [facility]  Exercises - Standing Toe Taps  - 1 x daily - 5 x weekly - 2 sets - 20 reps - Alternating Step Backward with Support  - 1 x daily - 5 x weekly - 2 sets - 10 reps - Sit to Stand Without Arm Support  - 1 x daily - 5 x weekly - 2 sets - 10 reps - Standing Hip Abduction with Resistance at Ankles and Counter Support  - 1 x daily - 5 x weekly - 2 sets - 10 reps - Sitting Knee Extension with Resistance  - 1 x daily - 5 x weekly - 2 sets - 10 reps   Below measures were taken at time of initial evaluation unless otherwise specified:  DIAGNOSTIC FINDINGS:  03/22/22 lumbar xray: No acute findings. Multiple compression deformities are identified the within the lumbar spine and lower thoracic spine which appear unchanged when compared with CT of the chest from 02/18/2022.  COGNITION: Overall cognitive status: Impaired   SENSATION: WFL  COORDINATION: Alt pronation/supination: slightly dysmetric on L Finger to nose: intact B Heel to shin: intact B but limited trunk control  MUSCLE TONE: intact B   POSTURE: rounded shoulders, forward head, and increased thoracic kyphosis  LOWER EXTREMITY ROM:     Active  Right Eval Left Eval  Hip flexion    Hip extension    Hip abduction    Hip adduction     Hip internal rotation    Hip external rotation    Knee flexion    Knee extension    Ankle dorsiflexion 11 15  Ankle plantarflexion    Ankle inversion    Ankle eversion     (Blank rows = not tested)   LOWER EXTREMITY MMT:     MMT (in sitting) Right Eval Left Eval Right 06/29/22 Left 06/29/22 Right 07/27/22 Left 07/27/22  Hip flexion 4 crepitus 4 4 crepitus 4+ 4 4+  Hip extension          Hip abduction _0 4+ 4+  Hip adduction 4 4- 4+ 4 4+ 4  Hip internal rotation          Hip external rotation          Knee flexion 4+ 4+ 4+ 4+ 4+ 4  Knee extension 4+ crepitus 4+ crepitus 4+ crepitus 4+ crepitus  4 4+  Ankle dorsiflexion 4+ 4+ 4+ 4+ 4+ 4+  Ankle plantarflexion 4+ 4+ 4+ 4+ 4+ 4+  Ankle inversion          Ankle eversion          (Blank rows = not tested)     GAIT: Gait pattern:  R foot vaulting with forward flexed trunk and slow gait speed  Assistive device utilized: Environmental consultant - 2 wheeled Level of assistance: SBA   PATIENT EDUCATION: Education details: prognosis, POC, HEP Person educated: Patient and Child(ren) Education method: Explanation, Demonstration, Tactile cues, Verbal cues, and Handouts Education comprehension: verbalized understanding   HOME EXERCISE PROGRAM: Access Code: [email or web address] URL: [email or web address] Date: 05/20/2022 Prepared by: [facility]  Exercises - Standing Toe Taps  - 1 x daily - 5 x weekly - 2 sets - 20 reps - Alternating Step Backward with Support  - 1 x daily - 5 x weekly - 2 sets - 10 reps - Sit to Stand Without Arm Support  - 1 x daily -  5 x weekly - 2 sets - 10 reps - Standing Hip Abduction with Resistance at Ankles and Counter Support  - 1 x daily - 5 x weekly - 2 sets - 10 reps   GOALS: Goals reviewed with patient? Yes  SHORT TERM GOALS: Target date: 06/10/2022  Patient to be independent with initial HEP. Baseline: HEP initiated Goal status: MET    LONG TERM  GOALS: Target date: 09/07/2022  Patient to be independent with advanced HEP. Baseline: Not yet initiated; met for current 07/27/22 Goal status: IN PROGRESS 07/27/22  Patient to demonstrate B LE strength >/=4+/5.  Baseline: See above; improving 06/29/22, improvement in B hip ABD 07/27/22 Goal status: IN PROGRESS 07/27/22  Patient to demonstrate improved postural awareness and correction of posture at rest and with activity. Baseline: see above; pt reports awareness while doing her exercises 06/29/22; pt reports improved awareness with ambulation 07/27/22 Goal status: MET 07/27/22  Patient to complete TUG in <18 sec with LRAD in order to decrease risk of falls.   Baseline: 21 sec; 28 sec 06/29/22, 30.94 sec 07/27/22 Goal status: IN PROGRESS 07/27/22  Patient to demonstrate 5xSTS test in <20 sec in order to decrease risk of falls.  Baseline: 26 sec; 61 sec 06/29/22, 21.38 sec 07/27/22 Goal status: IN PROGRESS  Patient to score at least 43/56 on Berg in order to decrease risk of falls.  Baseline: 37/56 Goal status: MET 06/29/22  Patient to score at least 45/56 on Berg in order to decrease risk of falls.  Baseline: 37/56; 43/56 06/29/22, 38/56 07/27/22 Goal status: IN PROGRESS 07/27/22  Patient to demonstrate alternating reciprocal stair navigation with 1 handrail with good stability.  Baseline: demonstrating step-to pattern descending with CGA-min A d/t instability and poor eccentric control  Goal status: IN PROGRESS 07/27/22   ASSESSMENT:  CLINICAL IMPRESSION: Worked in skilled PT session today on ankle, hip, step strategies for functional strength and balance.  With specific tasks, she is able to perform each strategy, but she does tend to speed up towards end of hip strategy work at wall.  Discussed options for working on these exercises at home and pt doesn't want to add any exercises at this time.  Discussed and practiced tips to reduce hesitation with gait; pt responds well to  cues for stop, rock through hips, and take big step to start again.  She will benefit from additional practice with these strategies.  OBJECTIVE IMPAIRMENTS Abnormal gait, decreased activity tolerance, decreased balance, decreased coordination, difficulty walking, decreased strength, decreased safety awareness, increased muscle spasms, impaired flexibility, improper body mechanics, and postural dysfunction.   ACTIVITY LIMITATIONS carrying, lifting, bending, standing, squatting, stairs, transfers, bathing, toileting, dressing, and reach over head  PARTICIPATION LIMITATIONS: meal prep, community activity, and church  PERSONAL FACTORS Age, Past/current experiences, Transportation, and 3+ comorbidities: breast CA with B mastectomy 2009, depression, Huntington disease, osteoporosis  are also affecting patient's functional outcome.   REHAB POTENTIAL: Good  CLINICAL DECISION MAKING: Evolving/moderate complexity  EVALUATION COMPLEXITY: Moderate  PLAN: PT FREQUENCY:  2 x/week  PT DURATION: other: 4 weeks  PLANNED INTERVENTIONS: Therapeutic exercises, Therapeutic activity, Neuromuscular re-education, Balance training, Gait training, Patient/Family education, Self Care, Joint mobilization, Stair training, Vestibular training, Canalith repositioning, Aquatic Therapy, Dry Needling, Cryotherapy, Moist heat, Taping, Manual therapy, and Re-evaluation  PLAN FOR NEXT SESSION: Continue edu on and practice using techniques to avoid freezing, practice ankle-hip-reaching strategies, floor transfers, update HEP with new exercises    Janene Harvey, PT, DPT 08/09/22 4:55 PM  [facility] at [facility] [place], [place], [place] [address] Phone # [phone] Fax # [phone]

## 2022-08-10 ENCOUNTER — Telehealth: Payer: Self-pay

## 2022-08-10 ENCOUNTER — Encounter: Payer: Self-pay | Admitting: Physical Therapy

## 2022-08-10 ENCOUNTER — Ambulatory Visit: Payer: Medicare Other | Admitting: Physical Therapy

## 2022-08-10 DIAGNOSIS — R2681 Unsteadiness on feet: Secondary | ICD-10-CM | POA: Diagnosis not present

## 2022-08-10 DIAGNOSIS — M6281 Muscle weakness (generalized): Secondary | ICD-10-CM

## 2022-08-10 DIAGNOSIS — R2689 Other abnormalities of gait and mobility: Secondary | ICD-10-CM

## 2022-08-10 DIAGNOSIS — R29818 Other symptoms and signs involving the nervous system: Secondary | ICD-10-CM

## 2022-08-10 DIAGNOSIS — R293 Abnormal posture: Secondary | ICD-10-CM

## 2022-08-10 NOTE — Patient Instructions (Signed)
Tips to reduce freezing episodes with standing or walking:  Stand tall with your feet wide, so that you can rock and weight shift through your hips. Don't try to fight the freeze: if you begin taking slower, faster, smaller steps, STOP, get your posture tall, and RESET your posture and balance.  Take a deep breath before taking the BIG step to start again. March in place, with high knee stepping, to get started walking again. Use auditory cues:  Count out loud, think of a familiar tune or song or cadence, use pocket metronome, to use rhythm to get started walking again. Use visual cues:  Use a line to step over, use laser pointer line to step over, (using BIG steps) to start walking again. Use visual targets to keep your posture tall (look ahead and focus on an object or target at eye level). As you approach where your destination with walking, count your steps out loud and/or focus on your target with your eyes until you are fully there. Use appropriate assistive device, as advised by your physical therapist to assist with taking longer, consistent steps.  

## 2022-08-10 NOTE — Telephone Encounter (Signed)
Spoke with patient and scheduled a telephonic Palliative Consult for 08/16/22 @ 11:30 AM.  Consent obtained; updated Netsmart, Team List and Epic.

## 2022-08-16 NOTE — Therapy (Signed)
OUTPATIENT PHYSICAL THERAPY TREATMENT NOTE   Patient Name: Barbara Krause MRN: [id number] DOB:12/26/1940, 81 y.o., female Today's Date: 08/17/2022   PCP: Cody, Jessica R, MD REFERRING PROVIDER: Clark, Katherine K, NP    PT End of Session - 08/17/22 1530     Visit Number 19    Number of Visits 24    Date for PT Re-Evaluation 09/07/22    Authorization Type Medicare/BCBS    Progress Note Due on Visit 20    PT Start Time 1445    PT Stop Time 1527    PT Time Calculation (min) 42 min    Equipment Utilized During Treatment Gait belt    Activity Tolerance Patient tolerated treatment well    Behavior During Therapy WFL for tasks assessed/performed                    Past Medical History:  Diagnosis Date   Breast cancer ([facility]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   Depression    History of chicken pox    History of colon polyps    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[facility])    Kidney stone    Osteoporosis    Past Surgical History:  Procedure Laterality Date   CESAREAN SECTION     1977 and 1979   CHOLECYSTECTOMY     TOTAL MASTECTOMY Bilateral 2009   Patient Active Problem List   Diagnosis Date Noted   Coronary atherosclerosis 08/03/2022   Compression fracture of thoracolumbar vertebra with routine healing 08/03/2022   DNR (do not resuscitate) 08/03/2022   At risk for aspiration 08/03/2022   Dysphagia 08/03/2022   High cholesterol 08/03/2022   Dementia ([facility]) 08/03/2022   Recurrent falls 11/26/2020   Moderate protein-calorie malnutrition ([facility]) 06/30/2020   History of intracranial hemorrhage 06/15/2020   Aortic atherosclerosis ([facility]) 08/30/2019   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[facility]) 08/30/2019   Microscopic hematuria 04/02/2019   Osteoporosis 04/02/2019   Vitamin B12 deficiency 04/02/2019   Thoracic aortic aneurysm (TAA) ([facility]) 04/02/2019   HTN (hypertension) 03/05/2019   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   Tobacco use 03/05/2019   Depression, major, single episode, complete remission ([facility]) 03/05/2019    Hx of breast cancer 03/05/2019   S/P bilateral mastectomy 03/05/2019    ONSET DATE: past several years  REFERRING DIAG: G10 (ICD-10-CM) - 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[facility])  THERAPY DIAG:  Unsteadiness on feet  Other abnormalities of gait and mobility  Muscle weakness (generalized)  Other symptoms and signs involving the nervous system  Abnormal posture  Rationale for Evaluation and Treatment Rehabilitation  SUBJECTIVE:                                                                                                                                                                                                SUBJECTIVE STATEMENT: Had a little fall getting out on her porch. Was able to get up on her own and did not hurt anything. "Just tripped."  Pt accompanied by: caregiver, [name].  PERTINENT HISTORY: breast CA with B mastectomy 2009, depression, Huntington disease, osteoporosis  PAIN:  Are you having pain? No  PRECAUTIONS: Fall and Other: osteoporosis and compression deformities in T-spine   PATIENT GOALS improve strength  OBJECTIVE:    TODAY'S TREATMENT: 08/17/22 Activity Comments  L4 x 6 min Ues/Les  Maintaining 50-60SPM  Sitting trunk flexion + big reach  10x Cueing to look straight ahead  Standing horizontal ABD with red TB 2x10 Cues to maintain elbows straight and posterior wt shift   Standing ER with red TB 2x10 Cueing to maintain elbows tucked to sides   1/2 kneel on airex + using elevated mat to stand 3x each side CGA-min A and heavy cueing to direct placement of foot and knee. 1 episode of mod A x2 to assist patient from the floor once she rolled onto her bottom.    fwd/back stepping 10x each 1 UE support on sink; cueing to increase knee flexion during swing  fwd/back stepping over hurdle 10x each Light uE support; tendency to lean L d/t hip instability     PATIENT EDUCATION: Education details: encouraged patient to use RW in the home, including the porch for  safety Person educated: Patient and Caregiver [name] Education method: Explanation, Demonstration, Tactile cues, and Verbal cues Education comprehension: verbalized understanding     HOME EXERCISE PROGRAM Last updated: 08/10/22 Access Code: [email or web address] URL: [email or web address] Date: 08/10/2022 Prepared by: [facility] - Outpatient  Rehab - [facility]  Exercises - Standing Toe Taps  - 1 x daily - 5 x weekly - 2 sets - 20 reps - Alternating Step Backward with Support  - 1 x daily - 5 x weekly - 2 sets - 10 reps - Sit to Stand Without Arm Support  - 1 x daily - 5 x weekly - 2 sets - 10 reps - Standing Hip Abduction with Resistance at Ankles and Counter Support  - 1 x daily - 5 x weekly - 2 sets - 10 reps - Sitting Knee Extension with Resistance  - 1 x daily - 5 x weekly - 2 sets - 10 reps - Forward Backward Weight Shift with Counter Support  - 1 x daily - 5 x weekly - 2 sets - 10 reps   Below measures were taken at time of initial evaluation unless otherwise specified:  DIAGNOSTIC FINDINGS:  03/22/22 lumbar xray: No acute findings. Multiple compression deformities are identified the within the lumbar spine and lower thoracic spine which appear unchanged when compared with CT of the chest from 02/18/2022.  COGNITION: Overall cognitive status: Impaired   SENSATION: WFL  COORDINATION: Alt pronation/supination: slightly dysmetric on L Finger to nose: intact B Heel to shin: intact B but limited trunk control  MUSCLE TONE: intact B   POSTURE: rounded shoulders, forward head, and increased thoracic kyphosis  LOWER EXTREMITY ROM:     Active  Right Eval Left Eval  Hip flexion    Hip extension    Hip abduction    Hip adduction    Hip internal rotation    Hip external rotation    Knee flexion    Knee extension    Ankle dorsiflexion 11 15  Ankle plantarflexion    Ankle inversion    Ankle eversion     (Blank rows = not tested)     LOWER EXTREMITY MMT:      MMT (in sitting) Right Eval Left Eval Right 06/29/22 Left 06/29/22 Right 07/27/22 Left 07/27/22  Hip flexion 4 crepitus 4 4 crepitus 4+ 4 4+  Hip extension          Hip abduction _0 4+ 4+  Hip adduction 4 4- 4+ 4 4+ 4  Hip internal rotation          Hip external rotation          Knee flexion 4+ 4+ 4+ 4+ 4+ 4  Knee extension 4+ crepitus 4+ crepitus 4+ crepitus 4+ crepitus  4 4+  Ankle dorsiflexion 4+ 4+ 4+ 4+ 4+ 4+  Ankle plantarflexion 4+ 4+ 4+ 4+ 4+ 4+  Ankle inversion          Ankle eversion          (Blank rows = not tested)     GAIT: Gait pattern:  R foot vaulting with forward flexed trunk and slow gait speed  Assistive device utilized: Environmental consultant - 2 wheeled Level of assistance: SBA   PATIENT EDUCATION: Education details: prognosis, POC, HEP Person educated: Patient and Child(ren) Education method: Explanation, Demonstration, Tactile cues, Verbal cues, and Handouts Education comprehension: verbalized understanding   HOME EXERCISE PROGRAM: Access Code: [email or web address] URL: [email or web address] Date: 05/20/2022 Prepared by: [facility] - Outpatient  Rehab - [facility]  Exercises - Standing Toe Taps  - 1 x daily - 5 x weekly - 2 sets - 20 reps - Alternating Step Backward with Support  - 1 x daily - 5 x weekly - 2 sets - 10 reps - Sit to Stand Without Arm Support  - 1 x daily - 5 x weekly - 2 sets - 10 reps - Standing Hip Abduction with Resistance at Ankles and Counter Support  - 1 x daily - 5 x weekly - 2 sets - 10 reps   GOALS: Goals reviewed with patient? Yes  SHORT TERM GOALS: Target date: 06/10/2022  Patient to be independent with initial HEP. Baseline: HEP initiated Goal status: MET    LONG TERM GOALS: Target date: 09/07/2022  Patient to be independent with advanced HEP. Baseline: Not yet initiated; met for current 07/27/22 Goal status: IN PROGRESS 07/27/22  Patient to demonstrate B LE strength >/=4+/5.  Baseline: See above;  improving 06/29/22, improvement in B hip ABD 07/27/22 Goal status: IN PROGRESS 07/27/22  Patient to demonstrate improved postural awareness and correction of posture at rest and with activity. Baseline: see above; pt reports awareness while doing her exercises 06/29/22; pt reports improved awareness with ambulation 07/27/22 Goal status: MET 07/27/22  Patient to complete TUG in <18 sec with LRAD in order to decrease risk of falls.   Baseline: 21 sec; 28 sec 06/29/22, 30.94 sec 07/27/22 Goal status: IN PROGRESS 07/27/22  Patient to demonstrate 5xSTS test in <20 sec in order to decrease risk of falls.  Baseline: 26 sec; 61 sec 06/29/22, 21.38 sec 07/27/22 Goal status: IN PROGRESS  Patient to score at least 43/56 on Berg in order to decrease risk of falls.  Baseline: 37/56 Goal status: MET 06/29/22  Patient to score at least 45/56 on Berg in order to decrease risk of falls.  Baseline: 37/56; 43/56 06/29/22, 38/56 07/27/22 Goal status: IN PROGRESS 07/27/22  Patient to demonstrate alternating reciprocal stair navigation with 1 handrail with good stability.  Baseline: demonstrating step-to pattern descending with CGA-min A d/t instability and poor eccentric control  Goal status: IN PROGRESS 07/27/22  ASSESSMENT:  CLINICAL IMPRESSION: Patient arrived to session with report of experiencing a fall a couple days ago. Reports that she was able to get up on her own and denies injuries. Patient appeared slightly more forward flexed in posture at start of session today. Patient performed postural strengthening activities in standing to provide additional balance challenge. Some posterior sway evident- provided cues to contract core to prevent this. Worked on components of floor transfers while providing cueing and minor assist. Patient with posterior LOB once in 1/2 kneeling, rolling onto her bottom on the floor. Required mod A x 2 to stand. Patient reported some knee pain but without bruising or  redness visible and able to proceed with session. Performed standing balance activities ith light UE support and cueing for tall posture. Patient tolerated session well and without complaints upon leaving.    OBJECTIVE IMPAIRMENTS Abnormal gait, decreased activity tolerance, decreased balance, decreased coordination, difficulty walking, decreased strength, decreased safety awareness, increased muscle spasms, impaired flexibility, improper body mechanics, and postural dysfunction.   ACTIVITY LIMITATIONS carrying, lifting, bending, standing, squatting, stairs, transfers, bathing, toileting, dressing, and reach over head  PARTICIPATION LIMITATIONS: meal prep, community activity, and church  PERSONAL FACTORS Age, Past/current experiences, Transportation, and 3+ comorbidities: breast CA with B mastectomy 2009, depression, Huntington disease, osteoporosis  are also affecting patient's functional outcome.   REHAB POTENTIAL: Good  CLINICAL DECISION MAKING: Evolving/moderate complexity  EVALUATION COMPLEXITY: Moderate  PLAN: PT FREQUENCY:  2 x/week  PT DURATION: other: 4 weeks  PLANNED INTERVENTIONS: Therapeutic exercises, Therapeutic activity, Neuromuscular re-education, Balance training, Gait training, Patient/Family education, Self Care, Joint mobilization, Stair training, Vestibular training, Canalith repositioning, Aquatic Therapy, Dry Needling, Cryotherapy, Moist heat, Taping, Manual therapy, and Re-evaluation  PLAN FOR NEXT SESSION: PN;Continue edu on and practice using techniques to avoid freezing, practice ankle-hip-reaching strategies, floor transfers, update HEP with new exercises    Janene Harvey, PT, DPT 08/17/22 3:31 PM  [facility] at [facility] [address], [place], [place] [address] Phone # [phone] Fax # [phone]

## 2022-08-17 ENCOUNTER — Telehealth: Payer: Medicare Other | Admitting: Student

## 2022-08-17 ENCOUNTER — Ambulatory Visit: Payer: Medicare Other | Admitting: Physical Therapy

## 2022-08-17 ENCOUNTER — Encounter: Payer: Self-pay | Admitting: Physical Therapy

## 2022-08-17 ENCOUNTER — Ambulatory Visit: Payer: Medicare Other | Admitting: Student

## 2022-08-17 DIAGNOSIS — R293 Abnormal posture: Secondary | ICD-10-CM

## 2022-08-17 DIAGNOSIS — G10 Huntington's disease: Secondary | ICD-10-CM

## 2022-08-17 DIAGNOSIS — Z515 Encounter for palliative care: Secondary | ICD-10-CM

## 2022-08-17 DIAGNOSIS — R2681 Unsteadiness on feet: Secondary | ICD-10-CM | POA: Diagnosis not present

## 2022-08-17 DIAGNOSIS — M6281 Muscle weakness (generalized): Secondary | ICD-10-CM

## 2022-08-17 DIAGNOSIS — R5381 Other malaise: Secondary | ICD-10-CM

## 2022-08-17 DIAGNOSIS — R2689 Other abnormalities of gait and mobility: Secondary | ICD-10-CM

## 2022-08-17 DIAGNOSIS — R29818 Other symptoms and signs involving the nervous system: Secondary | ICD-10-CM

## 2022-08-17 DIAGNOSIS — R1310 Dysphagia, unspecified: Secondary | ICD-10-CM

## 2022-08-17 NOTE — Progress Notes (Signed)
[place] Consult Note Telephone: [phone]  Fax: [phone]   Date of encounter: 08/17/22 9:33 AM PATIENT NAME: Barbara Krause [address] [place] [address]   [phone] (home)  DOB: 1941/06/10 MRN: [id number] PRIMARY CARE PROVIDER:    Waunita Schooner, MD,  [address] [place] [address] [phone]  REFERRING PROVIDER:   Waunita Krause, Barbara Krause,  [place] [address] [phone]  RESPONSIBLE PARTY:    Contact Information     Name Relation Home Work Mobile   Krause,Barbara Barbara Krause [phone]     Barbara Krause Barbara Krause [phone]          Due to the COVID-19 crisis, this visit was done via telemedicine from my office and it was initiated and consent by this patient and or family.  I connected with  Barbara Krause OR PROXY on 08/17/22 by a video enabled telemedicine application and verified that I am speaking with the correct person using two identifiers.   I discussed the limitations of evaluation and management by telemedicine. The patient expressed understanding and agreed to proceed.                                    ASSESSMENT AND PLAN / RECOMMENDATIONS:   Advance Care Planning/Goals of Care: Goals include to maximize quality of life and symptom management. Patient/health care surrogate gave his/her permission to discuss.Our advance care planning conversation included a discussion about:    The value and importance of advance care planning  Experiences with loved ones who have been seriously ill or have died  Exploration of personal, cultural or spiritual beliefs that might influence medical decisions  Exploration of goals of care in the event of a sudden injury or illness  Barbara Krause Barbara Krause is HCPOA MOST form reviewed-DNR, Comfort measures, antibiotics determined when and infection occurs, IV fluids for a defined trial, no feeding tube. Decision not to  resuscitate or to de-escalate disease focused treatments due to poor prognosis. CODE STATUS: DNR  Education provided on Palliative Medicine. We discussed Palliative vs. Hospice services. Patient currently receiving therapy. Patient would like for her wishes to be honored, as her disease progresses. She expresses comfort, be managed in the home.  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 She has increased difficulty in speech, worsening dysphagia, movements are worsening. She is currently receiving therapy. She is to continue [place] BID as directed. She is to use walker for ambulation; monitor for falls/safety. Follow up with neurology as scheduled.   Dysphagia-patient with worsening dysphagia. Modified barium study on 06/28/22; she is aspirating on thin, nectar and honey thick liquids. Recommendations per swallow evaluation soft and bite sized foods; thin liquids, meds crushed with pureed, check mouth for pocketing, encourage dry swallows after each bite, hard cough after swallowing, remain upright after eating drinking. Patient is eating regular consistency foods; we discussed risk of aspiration.   Debility- patient with daily caregiver in the home 4 hours/ day. Caregiver to assist with adls, continue outpatient therapy as directed.   Follow up Palliative Care Visit: Palliative care will continue to follow for complex medical decision making, advance care planning, and clarification of goals. Return in 6-8 weeks or prn.  I spent 22 minutes providing this consultation. More than 50% of the time in this consultation was spent in counseling and care coordination.   HOSPICE ELIGIBILITY/DIAGNOSIS: TBD  Chief Complaint: Palliative Medicine initial consult.   HISTORY OF PRESENT ILLNESS:  Barbara Krause is a 81 y.o. year old female  with Huntington's disease, dysphagia, aortic atherosclerosis, CAD, osteoporosis, dementia, emphysema, thoracic aortic aneurysm,  Vitamin B12 deficiency, hx of compression fracture, hx of breast cancer s/p chemo radiation.  Patient with progression of Huntington's disease. She is having increased difficulty with her speech, worsening dysphagia, movements are worsening. She is eating regular foods; modified swallow study in 8/23 shows aspiration with thin, nectar and honey liquids, different textures. She endorses fair appetite; has had some weight gain with drinking 2 Boost a day. No recent infections. Patient had a fall in the past week. She has a hx of significant falls resulting in subdural hematoma, left and right elbow fractures. Patient denies pain, shortness of breath, nausea constipation. She reports sleeping well.    History obtained from review of EMR, discussion with primary team, and interview with family, facility staff/caregiver and/or Barbara Krause.  I reviewed available labs, medications, imaging, studies and related documents from the EMR.  Records reviewed and summarized above.   ROS  General: NAD EYES: denies vision changes ENMT: +dysphagia Cardiovascular: denies chest pain, denies DOE Pulmonary: denies increased SOB Abdomen: denies constipation, endorses continence of bowel GU: denies dysuria MSK: increased weakness, falls reported Skin: denies rashes or wounds Neurological: denies pain, denies insomnia, speech difficulty Psych: Endorses stable mood Heme/lymph/immuno: denies bruises, abnormal bleeding  Physical Exam: Deferred d/t this being a telephone visit.   CURRENT PROBLEM LIST:  Patient Active Problem List   Diagnosis Date Noted   Coronary atherosclerosis 08/03/2022   Compression fracture of thoracolumbar vertebra with routine healing 08/03/2022   DNR (do not resuscitate) 08/03/2022   At risk for aspiration 08/03/2022   Dysphagia 08/03/2022   High cholesterol 08/03/2022   Dementia ([place]) 08/03/2022   Recurrent falls 11/26/2020   Moderate protein-calorie malnutrition ([place]) 06/30/2020    History of intracranial hemorrhage 06/15/2020   Aortic atherosclerosis ([place]) 08/30/2019   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[place]) 08/30/2019   Microscopic hematuria 04/02/2019   Osteoporosis 04/02/2019   Vitamin B12 deficiency 04/02/2019   Thoracic aortic aneurysm (TAA) ([place]) 04/02/2019   HTN (hypertension) 03/05/2019   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[place]) 03/05/2019   Tobacco use 03/05/2019   Depression, major, single episode, complete remission ([place]) 03/05/2019   Hx of breast cancer 03/05/2019   S/P bilateral mastectomy 03/05/2019   PAST MEDICAL HISTORY:  Active Ambulatory Problems    Diagnosis Date Noted   HTN (hypertension) 03/05/2019   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[place]) 03/05/2019   Tobacco use 03/05/2019   Depression, major, single episode, complete remission ([place]) 03/05/2019   Hx of breast cancer 03/05/2019   S/P bilateral mastectomy 03/05/2019   Microscopic hematuria 04/02/2019   Osteoporosis 04/02/2019   Vitamin B12 deficiency 04/02/2019   Thoracic aortic aneurysm (TAA) ([place]) 04/02/2019   Aortic atherosclerosis ([place]) 08/30/2019   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[place]) 08/30/2019   History of intracranial hemorrhage 06/15/2020   Moderate protein-calorie malnutrition ([place]) 06/30/2020   Recurrent falls 11/26/2020   Coronary atherosclerosis 08/03/2022   Compression fracture of thoracolumbar vertebra with routine healing 08/03/2022   DNR (do not resuscitate) 08/03/2022   At risk for aspiration 08/03/2022   Dysphagia 08/03/2022   High cholesterol 08/03/2022   Dementia ([place]) 08/03/2022   Resolved Ambulatory Problems    Diagnosis Date Noted   Chronic fatigue syndrome 04/02/2019   Closed fracture of upper end of left ulna 02/28/2020   Forehead contusion 02/28/2020   Accidental fall 02/28/2020  Intracerebral hemorrhage ([place]) 06/15/2020   Closed fracture dislocation of right elbow 11/24/2020   Acute bilateral low back pain without sciatica 03/22/2022   Past Medical History:  Diagnosis Date   Breast cancer ([place])  2009   Depression    History of chicken pox    History of colon polyps    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[place])    Kidney stone    SOCIAL HX:  Social History   Tobacco Use   Smoking status: Every Day    Packs/day: 0.75    Years: 50.00    Total pack years: 37.50    Types: Cigarettes   Smokeless tobacco: Never  Substance Use Topics   Alcohol use: Yes    Comment: about 1 glass of wine x 2 a week   FAMILY HX:  Family History  Problem Relation Age of Onset   Colon cancer Mother    Stroke Father    Heart disease Father    Ovarian cancer Sister    Healthy Barbara Krause    Stomach cancer Maternal Grandmother    Diabetes Maternal Grandfather    Colon cancer Paternal Grandmother    Diabetes Paternal Grandfather    Healthy Barbara Krause       ALLERGIES:  Allergies  Allergen Reactions   Penicillin G Hives     PERTINENT MEDICATIONS:  Outpatient Encounter Medications as of 08/17/2022  Medication Sig   alendronate (FOSAMAX) 70 MG tablet Take 1 tablet (70 mg total) by mouth every 7 (seven) days. Take with a full glass of water on an empty stomach.   atorvastatin (LIPITOR) 10 MG tablet TAKE 1 TABLET BY MOUTH EVERY DAY   AUSTEDO 12 MG TABS Take 2 tablets by mouth 2 (two) times daily.   donepezil (ARICEPT) 5 MG tablet TAKE 1 TABLET (5 MG TOTAL) BY MOUTH DAILY.   escitalopram (LEXAPRO) 20 MG tablet TAKE 1 TABLET BY MOUTH EVERY DAY   losartan (COZAAR) 25 MG tablet Take 1 tablet (25 mg total) by mouth daily.   No facility-administered encounter medications on file as of 08/17/2022.   Thank you for the opportunity to participate in the care of Ms. Hailu.  The palliative care team will continue to follow. Please call our office at [phone] if we can be of additional assistance.   Ezekiel Slocumb, NP   COVID-19 PATIENT SCREENING TOOL Asked and negative response unless otherwise noted:  Have you had symptoms of covid, tested positive or been in contact with someone with symptoms/positive test in the  past 5-10 days? No

## 2022-08-24 ENCOUNTER — Ambulatory Visit: Payer: Medicare Other | Admitting: Physical Therapy

## 2022-08-25 NOTE — Therapy (Signed)
OUTPATIENT PHYSICAL THERAPY TREATMENT NOTE   Patient Name: Barbara Krause MRN: [id number] DOB:Apr 24, 1941, 81 y.o., female Today's Date: 08/25/2022   PCP: [name], MD REFERRING PROVIDER: Pleas Koch, NP             Past Medical History:  Diagnosis Date   Breast cancer [facility]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   Depression    History of chicken pox    History of colon polyps    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[place])    Kidney stone    Osteoporosis    Past Surgical History:  Procedure Laterality Date   CESAREAN SECTION     1977 and [place] Bilateral 2009   Patient Active Problem List   Diagnosis Date Noted   Coronary atherosclerosis 08/03/2022   Compression fracture of thoracolumbar vertebra with routine healing 08/03/2022   DNR (do not resuscitate) 08/03/2022   At risk for aspiration 08/03/2022   Dysphagia 08/03/2022   High cholesterol 08/03/2022   Dementia ([place]) 08/03/2022   Recurrent falls 11/26/2020   Moderate protein-calorie malnutrition ([place]) 06/30/2020   History of intracranial hemorrhage 06/15/2020   Aortic atherosclerosis ([place]) 08/30/2019   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[place]) 08/30/2019   Microscopic hematuria 04/02/2019   Osteoporosis 04/02/2019   Vitamin B12 deficiency 04/02/2019   Thoracic aortic aneurysm (TAA) ([place]) 04/02/2019   HTN (hypertension) 03/05/2019   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[place]) 03/05/2019   Tobacco use 03/05/2019   Depression, major, single episode, complete remission ([place]) 03/05/2019   Hx of breast cancer 03/05/2019   S/P bilateral mastectomy 03/05/2019    ONSET DATE: past several years  REFERRING DIAG: G10 (ICD-10-CM) - 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[place])  THERAPY DIAG:  No diagnosis found.  Rationale for Evaluation and Treatment Rehabilitation  SUBJECTIVE:                                                                                                                                                                                               SUBJECTIVE STATEMENT: Had a little fall getting out on her porch. Was able to get up on her own and did not hurt anything. "Just tripped."  Pt accompanied by: caregiver, [name].  PERTINENT HISTORY: breast CA with B mastectomy 2009, depression, Huntington disease, osteoporosis  PAIN:  Are you having pain? No  PRECAUTIONS: Fall and Other: osteoporosis and compression deformities in T-spine   PATIENT GOALS improve strength  OBJECTIVE:     TODAY'S TREATMENT: 08/26/22 Activity Comments  LOWER EXTREMITY MMT:     MMT (in sitting) Right Eval Left Eval Right 06/29/22 Left 06/29/22 Right 07/27/22 Left 07/27/22 Right 08/26/22 Left 08/26/22  Hip flexion 4 crepitus 4 4 crepitus 4+ 4 4+    Hip extension            Hip abduction _0 4+ 4+    Hip adduction 4 4- 4+ 4 4+ 4    Hip internal rotation            Hip external rotation            Knee flexion 4+ 4+ 4+ 4+ 4+ 4    Knee extension 4+ crepitus 4+ crepitus 4+ crepitus 4+ crepitus  4 4+    Ankle dorsiflexion 4+ 4+ 4+ 4+ 4+ 4+    Ankle plantarflexion 4+ 4+ 4+ 4+ 4+ 4+    Ankle inversion            Ankle eversion            (Blank rows = not tested)     HOME EXERCISE PROGRAM Last updated: 08/10/22 Access Code: [email or web address] URL: [email or web address] Date: 08/10/2022 Prepared by: [facility]  Exercises - Standing Toe Taps  - 1 x daily - 5 x weekly - 2 sets - 20 reps - Alternating Step Backward with Support  - 1 x daily - 5 x weekly - 2 sets - 10 reps - Sit to Stand Without Arm Support  - 1 x daily - 5 x weekly - 2 sets - 10 reps - Standing Hip Abduction with Resistance at Ankles and Counter Support  - 1 x daily - 5 x weekly - 2 sets - 10 reps - Sitting Knee Extension with Resistance  - 1 x daily - 5 x weekly - 2 sets - 10 reps - Forward Backward Weight Shift with Counter Support  - 1 x daily - 5 x weekly - 2 sets - 10  reps   Below measures were taken at time of initial evaluation unless otherwise specified:  DIAGNOSTIC FINDINGS:  03/22/22 lumbar xray: No acute findings. Multiple compression deformities are identified the within the lumbar spine and lower thoracic spine which appear unchanged when compared with CT of the chest from 02/18/2022.  COGNITION: Overall cognitive status: Impaired   SENSATION: WFL  COORDINATION: Alt pronation/supination: slightly dysmetric on L Finger to nose: intact B Heel to shin: intact B but limited trunk control  MUSCLE TONE: intact B   POSTURE: rounded shoulders, forward head, and increased thoracic kyphosis  LOWER EXTREMITY ROM:     Active  Right Eval Left Eval  Hip flexion    Hip extension    Hip abduction    Hip adduction    Hip internal rotation    Hip external rotation    Knee flexion    Knee extension    Ankle dorsiflexion 11 15  Ankle plantarflexion    Ankle inversion    Ankle eversion     (Blank rows = not tested)   LOWER EXTREMITY MMT:     MMT (in sitting) Right Eval Left Eval Right 06/29/22 Left 06/29/22 Right 07/27/22 Left 07/27/22  Hip flexion 4 crepitus 4 4 crepitus 4+ 4 4+  Hip extension          Hip abduction _1 4+ 4+  Hip adduction 4 4- 4+ 4 4+ 4  Hip internal rotation  Hip external rotation          Knee flexion 4+ 4+ 4+ 4+ 4+ 4  Knee extension 4+ crepitus 4+ crepitus 4+ crepitus 4+ crepitus  4 4+  Ankle dorsiflexion 4+ 4+ 4+ 4+ 4+ 4+  Ankle plantarflexion 4+ 4+ 4+ 4+ 4+ 4+  Ankle inversion          Ankle eversion          (Blank rows = not tested)     GAIT: Gait pattern:  R foot vaulting with forward flexed trunk and slow gait speed  Assistive device utilized: Environmental consultant - 2 wheeled Level of assistance: SBA   PATIENT EDUCATION: Education details: prognosis, POC, HEP Person educated: Patient and Child(ren) Education method: Explanation, Demonstration, Tactile cues, Verbal cues, and  Handouts Education comprehension: verbalized understanding   HOME EXERCISE PROGRAM: Access Code: [email or web address] URL: [email or web address] Date: 05/20/2022 Prepared by: [facility]  Exercises - Standing Toe Taps  - 1 x daily - 5 x weekly - 2 sets - 20 reps - Alternating Step Backward with Support  - 1 x daily - 5 x weekly - 2 sets - 10 reps - Sit to Stand Without Arm Support  - 1 x daily - 5 x weekly - 2 sets - 10 reps - Standing Hip Abduction with Resistance at Ankles and Counter Support  - 1 x daily - 5 x weekly - 2 sets - 10 reps   GOALS: Goals reviewed with patient? Yes  SHORT TERM GOALS: Target date: 06/10/2022  Patient to be independent with initial HEP. Baseline: HEP initiated Goal status: MET    LONG TERM GOALS: Target date: 09/07/2022  Patient to be independent with advanced HEP. Baseline: Not yet initiated; met for current 07/27/22 Goal status: IN PROGRESS 07/27/22  Patient to demonstrate B LE strength >/=4+/5.  Baseline: See above; improving 06/29/22, improvement in B hip ABD 07/27/22 Goal status: IN PROGRESS 07/27/22  Patient to demonstrate improved postural awareness and correction of posture at rest and with activity. Baseline: see above; pt reports awareness while doing her exercises 06/29/22; pt reports improved awareness with ambulation 07/27/22 Goal status: MET 07/27/22  Patient to complete TUG in <18 sec with LRAD in order to decrease risk of falls.   Baseline: 21 sec; 28 sec 06/29/22, 30.94 sec 07/27/22 Goal status: IN PROGRESS 07/27/22  Patient to demonstrate 5xSTS test in <20 sec in order to decrease risk of falls.  Baseline: 26 sec; 61 sec 06/29/22, 21.38 sec 07/27/22 Goal status: IN PROGRESS  Patient to score at least 43/56 on Berg in order to decrease risk of falls.  Baseline: 37/56 Goal status: MET 06/29/22  Patient to score at least 45/56 on Berg in order to decrease risk of falls.  Baseline:  37/56; 43/56 06/29/22, 38/56 07/27/22 Goal status: IN PROGRESS 07/27/22  Patient to demonstrate alternating reciprocal stair navigation with 1 handrail with good stability.  Baseline: demonstrating step-to pattern descending with CGA-min A d/t instability and poor eccentric control  Goal status: IN PROGRESS 07/27/22   ASSESSMENT:  CLINICAL IMPRESSION: Patient arrived to session with report of experiencing a fall a couple days ago. Reports that she was able to get up on her own and denies injuries. Patient appeared slightly more forward flexed in posture at start of session today. Patient performed postural strengthening activities in standing to provide additional balance challenge. Some posterior sway evident- provided cues to contract core to prevent this. Worked on components of  floor transfers while providing cueing and minor assist. Patient with posterior LOB once in 1/2 kneeling, rolling onto her bottom on the floor. Required mod A x 2 to stand. Patient reported some knee pain but without bruising or redness visible and able to proceed with session. Performed standing balance activities ith light UE support and cueing for tall posture. Patient tolerated session well and without complaints upon leaving.    OBJECTIVE IMPAIRMENTS Abnormal gait, decreased activity tolerance, decreased balance, decreased coordination, difficulty walking, decreased strength, decreased safety awareness, increased muscle spasms, impaired flexibility, improper body mechanics, and postural dysfunction.   ACTIVITY LIMITATIONS carrying, lifting, bending, standing, squatting, stairs, transfers, bathing, toileting, dressing, and reach over head  PARTICIPATION LIMITATIONS: meal prep, community activity, and church  PERSONAL FACTORS Age, Past/current experiences, Transportation, and 3+ comorbidities: breast CA with B mastectomy 2009, depression, Huntington disease, osteoporosis  are also affecting patient's functional  outcome.   REHAB POTENTIAL: Good  CLINICAL DECISION MAKING: Evolving/moderate complexity  EVALUATION COMPLEXITY: Moderate  PLAN: PT FREQUENCY:  2 x/week  PT DURATION: other: 4 weeks  PLANNED INTERVENTIONS: Therapeutic exercises, Therapeutic activity, Neuromuscular re-education, Balance training, Gait training, Patient/Family education, Self Care, Joint mobilization, Stair training, Vestibular training, Canalith repositioning, Aquatic Therapy, Dry Needling, Cryotherapy, Moist heat, Taping, Manual therapy, and Re-evaluation  PLAN FOR NEXT SESSION: PN;Continue edu on and practice using techniques to avoid freezing, practice ankle-hip-reaching strategies, floor transfers, update HEP with new exercises    Janene Harvey, PT, DPT 08/25/22 11:30 AM  [facility] [address], [place], [place] [address] Phone # [phone] Fax # [phone]

## 2022-08-26 ENCOUNTER — Encounter: Payer: Self-pay | Admitting: Physical Therapy

## 2022-08-26 ENCOUNTER — Ambulatory Visit: Payer: Medicare Other | Admitting: Physical Therapy

## 2022-08-26 DIAGNOSIS — M6281 Muscle weakness (generalized): Secondary | ICD-10-CM

## 2022-08-26 DIAGNOSIS — R29818 Other symptoms and signs involving the nervous system: Secondary | ICD-10-CM

## 2022-08-26 DIAGNOSIS — R2689 Other abnormalities of gait and mobility: Secondary | ICD-10-CM

## 2022-08-26 DIAGNOSIS — R2681 Unsteadiness on feet: Secondary | ICD-10-CM | POA: Diagnosis not present

## 2022-08-26 DIAGNOSIS — R293 Abnormal posture: Secondary | ICD-10-CM

## 2022-08-27 NOTE — Therapy (Signed)
OUTPATIENT PHYSICAL THERAPY NOTE   Patient Name: Barbara Krause MRN: [id number] DOB:[birth year], 81 y.o., female Today's Date: 08/31/2022    PCP: [name], MD REFERRING PROVIDER: Pleas Koch, NP    PT End of Session - 08/31/22 1537     Visit Number 21    Number of Visits 24    Date for PT Re-Evaluation 09/07/22    Authorization Type Medicare/BCBS    Progress Note Due on Visit 33    PT Start Time 1446    PT Stop Time 0086    PT Time Calculation (min) 44 min    Equipment Utilized During Treatment Gait belt    Activity Tolerance Patient tolerated treatment well    Behavior During Therapy WFL for tasks assessed/performed                      Past Medical History:  Diagnosis Date   Breast cancer ([place]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   Depression    History of chicken pox    History of colon polyps    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[place])    Kidney stone    Osteoporosis    Past Surgical History:  Procedure Laterality Date   CESAREAN SECTION     1977 and [place] Bilateral 2009   Patient Active Problem List   Diagnosis Date Noted   Coronary atherosclerosis 08/03/2022   Compression fracture of thoracolumbar vertebra with routine healing 08/03/2022   DNR (do not resuscitate) 08/03/2022   At risk for aspiration 08/03/2022   Dysphagia 08/03/2022   High cholesterol 08/03/2022   Dementia ([place]) 08/03/2022   Recurrent falls 11/26/2020   Moderate protein-calorie malnutrition ([place]) 06/30/2020   History of intracranial hemorrhage 06/15/2020   Aortic atherosclerosis ([place]) 08/30/2019   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[place]) 08/30/2019   Microscopic hematuria 04/02/2019   Osteoporosis 04/02/2019   Vitamin B12 deficiency 04/02/2019   Thoracic aortic aneurysm (TAA) ([place]) 04/02/2019   HTN (hypertension) 03/05/2019   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 Looking forward to trick or treaters tonight. Caregiver denies falls.   Pt accompanied by: caregiver, [name].  PERTINENT HISTORY: breast CA with B mastectomy 2009, depression, Huntington disease, osteoporosis  PAIN:  Are you having pain? No  PRECAUTIONS: Fall and Other: osteoporosis and compression deformities in T-spine   PATIENT GOALS improve strength  OBJECTIVE:      TODAY'S TREATMENT: 08/31/22 Activity Comments  Nustep L4 x 6 min Ues/Les  Maintaining at least 60 SPM   Floor transfer 2x 1st time with continuous cueing and demo, 2nd time without cueing and CGA only; able to perform safely using elevated mat   resisted march 2# 2x20   toe tap on 1st, 2nd step, down with 2# ankle weights 10x each CGA; required sit break after 1st set d/t fatigue; particularly difficult lifting L LE   gait with and without ball toss 6x60 ft Considerable difficulty performing- occasional LOB and scissoring, difficulty tossing appropriate height/distance, trouble maintaining gait   farmer's carry 5# ~126f Cueing for heel-toe pattern turned into heel walking- required cues to correct in order to encourage toe-off     PATIENT EDUCATION: Education details: discussion with pt and caregiver on anticipated D/C next session Person educated: Patient and Caregiver  kim Education method: Explanation, Demonstration, Tactile cues, Verbal cues, and Handouts Education comprehension: verbalized understanding and returned demonstration     HOME EXERCISE PROGRAM Last updated: 08/26/22 Access Code: [email or web address] URL: [email or web address] Date: 08/26/2022 [facility]ared by: [facility]  Exercises - Standing Toe Taps  - 1 x daily - 5 x weekly - 2 sets - 20 reps - Alternating Step Backward with Support  - 1 x daily - 5 x weekly - 2 sets - 10 reps - Sit to Stand Without Arm Support  - 1 x daily - 5 x weekly - 2 sets - 10 reps - Standing Hip Abduction with Resistance at Ankles and Counter Support  - 1 x daily - 5 x weekly - 2 sets - 10 reps - Sitting Knee Extension with Resistance  - 1 x daily - 5 x weekly - 2 sets - 10 reps - Forward Backward Weight Shift with Counter Support  - 1 x daily - 5 x weekly - 2 sets - 10 reps - Standing March with Counter Support  - 1 x daily - 5 x weekly - 2 sets - 10 reps    Below measures were taken at time of initial evaluation unless otherwise specified:  DIAGNOSTIC FINDINGS:  03/22/22 lumbar xray: No acute findings. Multiple compression deformities are identified the within the lumbar spine and lower thoracic spine which appear unchanged when compared with CT of the chest from 02/18/2022.  COGNITION: Overall cognitive status: Impaired   SENSATION: WFL  COORDINATION: Alt pronation/supination: slightly dysmetric on L Finger to nose: intact B Heel to shin: intact B but limited trunk control  MUSCLE TONE: intact B   POSTURE: rounded shoulders, forward head, and increased thoracic kyphosis  LOWER EXTREMITY ROM:     Active  Right Eval Left Eval  Hip flexion    Hip extension    Hip abduction    Hip adduction    Hip internal rotation    Hip external rotation    Knee flexion    Knee extension    Ankle dorsiflexion 11 15  Ankle plantarflexion    Ankle inversion     Ankle eversion     (Blank rows = not tested)  LOWER EXTREMITY MMT:     MMT (in sitting) Right Eval Left Eval Right 06/29/22 Left 06/29/22 Right 07/27/22 Left 07/27/22 Right 08/26/22 Left 08/26/22  Hip flexion 4 crepitus 4 4 crepitus 4+ 4 4+ 4- 4  Hip extension            Hip abduction _0 4+ 4+ 4+ 4+  Hip adduction 4 4- 4+ 4 4+ 4 4+ 4+  Hip internal rotation            Hip external rotation            Knee flexion 4+ 4+ 4+ 4+ 4+ 4 4+ 4  Knee extension 4+ crepitus 4+ crepitus 4+ crepitus 4+ crepitus  4 4+ 4 4+  Ankle dorsiflexion 4+ 4+ 4+ 4+ 4+ 4+ 5 5  Ankle plantarflexion 4+ 4+ 4+ 4+ 4+ 4+ 4+ 4+  Ankle inversion            Ankle eversion            (Blank rows = not tested)    GAIT: Gait pattern:  R foot vaulting with forward flexed trunk and slow gait speed  Assistive device utilized: Environmental consultant - 2 wheeled Level of assistance: SBA   PATIENT EDUCATION: Education details: prognosis, POC, HEP Person educated: Patient and Child(ren) Education method: Explanation, Demonstration, Tactile cues, Verbal cues, and Handouts Education comprehension: verbalized understanding   HOME EXERCISE PROGRAM: Access Code: [email or web address] URL: [email or web address] Date: 05/20/2022 Prepared by: [facility] - Outpatient  Rehab - [facility]  Exercises - Standing Toe Taps  - 1 x daily - 5 x weekly - 2 sets - 20 reps - Alternating Step Backward with Support  - 1 x daily - 5 x weekly - 2 sets - 10 reps - Sit to Stand Without Arm Support  - 1 x daily - 5 x weekly - 2 sets - 10 reps - Standing Hip Abduction with Resistance at Ankles and Counter Support  - 1 x daily - 5 x weekly - 2 sets - 10 reps   GOALS: Goals reviewed with patient? Yes  SHORT TERM GOALS: Target date: 06/10/2022  Patient to be independent with initial HEP. Baseline: HEP initiated Goal status: MET    LONG TERM GOALS: Target date: 09/07/2022  Patient to be independent with advanced HEP. Baseline:  Not yet initiated; met for current 08/26/22 Goal status: IN PROGRESS 08/26/22  Patient to demonstrate B LE strength >/=4+/5.  Baseline: See above; improving 06/29/22, improvement in B hip ABD 07/27/22; improved in L hip adduction and B dorsiflexion 08/26/22 Goal status: IN PROGRESS 08/26/22  Patient to demonstrate improved postural awareness and correction of posture at rest and with activity. Baseline: see above; pt reports awareness while doing her exercises 06/29/22; pt reports improved awareness with ambulation 07/27/22 Goal status: MET 07/27/22  Patient to complete TUG in <18 sec with LRAD in order to decrease risk of falls.   Baseline: 21 sec; 28 sec 06/29/22, 30.94 sec 07/27/22; 24 sec 08/26/22 Goal status: IN PROGRESS 08/26/22  Patient to demonstrate 5xSTS test in <20 sec in order to decrease risk of falls.  Baseline: 26 sec; 61 sec 06/29/22, 21.38 sec 07/27/22; 21.23 sec 08/26/22 Goal status: IN PROGRESS 08/26/22  Patient to score at least 43/56 on Berg in order to decrease risk of falls.  Baseline: 37/56 Goal status: MET 06/29/22  Patient to score at least 45/56 on Berg in order to decrease risk of falls.  Baseline: 37/56; 43/56 06/29/22,  38/56 07/27/22; 42/56 08/26/22 Goal status: IN PROGRESS 08/26/22  Patient to demonstrate alternating reciprocal stair navigation with 1 handrail with good stability.  Baseline: demonstrating step-to pattern descending with CGA-min A d/t instability and poor eccentric control; With 2 handrails; tendency to "hoist" self up taller steps R>L; limited eccentric control descending  08/26/22 Goal status: IN PROGRESS 08/26/22   ASSESSMENT:  CLINICAL IMPRESSION: Patient arrived to session without new complaints. Patient performed floor transfers with verbal cueing and demo; patient ultimately able to demonstrate safety with only CGA. Worked on hip flexor strengthening with additional balance challenges. Required occasional sit breaks d/t c/o  fatigue. Worked on focusing on heel-toe pattern and longer step length with gait without device- added additional tasks while walking with considerable difficulty and guarding required. Patient tolerated session well. Spoke a about upcoming D/C at next appointment which patient and caregiver are agreeable with.    OBJECTIVE IMPAIRMENTS Abnormal gait, decreased activity tolerance, decreased balance, decreased coordination, difficulty walking, decreased strength, decreased safety awareness, increased muscle spasms, impaired flexibility, improper body mechanics, and postural dysfunction.   ACTIVITY LIMITATIONS carrying, lifting, bending, standing, squatting, stairs, transfers, bathing, toileting, dressing, and reach over head  PARTICIPATION LIMITATIONS: meal prep, community activity, and church  PERSONAL FACTORS Age, Past/current experiences, Transportation, and 3+ comorbidities: breast CA with B mastectomy 2009, depression, Huntington disease, osteoporosis  are also affecting patient's functional outcome.   REHAB POTENTIAL: Good  CLINICAL DECISION MAKING: Evolving/moderate complexity  EVALUATION COMPLEXITY: Moderate  PLAN: PT FREQUENCY:  2 x/week  PT DURATION: other: 4 weeks  PLANNED INTERVENTIONS: Therapeutic exercises, Therapeutic activity, Neuromuscular re-education, Balance training, Gait training, Patient/Family education, Self Care, Joint mobilization, Stair training, Vestibular training, Canalith repositioning, Aquatic Therapy, Dry Needling, Cryotherapy, Moist heat, Taping, Manual therapy, and Re-evaluation  PLAN FOR NEXT SESSION: DC and HEP review   Janene Harvey, PT, DPT 08/31/22 3:46 PM  [facility] at [facility] [address], [place],  [address] Phone # [phone] Fax # [phone]

## 2022-08-31 ENCOUNTER — Ambulatory Visit: Payer: Medicare Other | Admitting: Physical Therapy

## 2022-08-31 ENCOUNTER — Encounter: Payer: Self-pay | Admitting: Physical Therapy

## 2022-08-31 DIAGNOSIS — M6281 Muscle weakness (generalized): Secondary | ICD-10-CM

## 2022-08-31 DIAGNOSIS — R293 Abnormal posture: Secondary | ICD-10-CM

## 2022-08-31 DIAGNOSIS — R2681 Unsteadiness on feet: Secondary | ICD-10-CM | POA: Diagnosis not present

## 2022-08-31 DIAGNOSIS — R29818 Other symptoms and signs involving the nervous system: Secondary | ICD-10-CM

## 2022-08-31 DIAGNOSIS — R2689 Other abnormalities of gait and mobility: Secondary | ICD-10-CM

## 2022-09-06 NOTE — Therapy (Signed)
OUTPATIENT PHYSICAL THERAPY NOTE   Patient Name: Barbara Krause MRN: [id number] DOB:October 03, 1941, 81 y.o., female Today's Date: 08/31/2022    PCP: [name], MD REFERRING PROVIDER: Pleas Koch, NP    PT End of Session - 08/31/22 1537     Visit Number 21    Number of Visits 24    Date for PT Re-Evaluation 09/07/22    Authorization Type Medicare/BCBS    Progress Note Due on Visit 40    PT Start Time 1446    PT Stop Time 0355    PT Time Calculation (min) 44 min    Equipment Utilized During Treatment Gait belt    Activity Tolerance Patient tolerated treatment well    Behavior During Therapy WFL for tasks assessed/performed                      Past Medical History:  Diagnosis Date   Breast cancer ([place]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   Depression    History of chicken pox    History of colon polyps    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[place])    Kidney stone    Osteoporosis    Past Surgical History:  Procedure Laterality Date   CESAREAN SECTION     1977 and [place] Bilateral 2009   Patient Active Problem List   Diagnosis Date Noted   Coronary atherosclerosis 08/03/2022   Compression fracture of thoracolumbar vertebra with routine healing 08/03/2022   DNR (do not resuscitate) 08/03/2022   At risk for aspiration 08/03/2022   Dysphagia 08/03/2022   High cholesterol 08/03/2022   Dementia ([place]) 08/03/2022   Recurrent falls 11/26/2020   Moderate protein-calorie malnutrition ([place]) 06/30/2020   History of intracranial hemorrhage 06/15/2020   Aortic atherosclerosis ([place]) 08/30/2019   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[place]) 08/30/2019   Microscopic hematuria 04/02/2019   Osteoporosis 04/02/2019   Vitamin B12 deficiency 04/02/2019   Thoracic aortic aneurysm (TAA) ([place]) 04/02/2019   HTN (hypertension) 03/05/2019   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 Looking forward to trick or treaters tonight. Caregiver denies falls.   Pt accompanied by: caregiver, [name].  PERTINENT HISTORY: breast CA with B mastectomy 2009, depression, Huntington disease, osteoporosis  PAIN:  Are you having pain? No  PRECAUTIONS: Fall and Other: osteoporosis and compression deformities in T-spine   PATIENT GOALS improve strength  OBJECTIVE:    TODAY'S TREATMENT: 09/07/22 Activity Comments  TUG   5xSTS   Berg   Stairs           LOWER EXTREMITY MMT:     MMT (in sitting) Right 06/29/22 Left 06/29/22 Right 07/27/22 Left 07/27/22 Right 08/26/22 Left 08/26/22 Right 09/07/22 Left 09/07/22  Hip flexion 4 crepitus 4+ 4 4+ 4- 4    Hip extension          Hip abduction 4 4 4+ 4+ 4+ 4+    Hip adduction 4+ 4 4+ 4 4+ 4+    Hip internal rotation          Hip external rotation          Knee flexion 4+ 4+ 4+ 4 4+ 4    Knee extension 4+ crepitus 4+ crepitus  4 4+ 4 4+    Ankle dorsiflexion 4+ 4+ 4+ 4+ 5 5    Ankle plantarflexion 4+ 4+ 4+ 4+ 4+ 4+    Ankle inversion          Ankle eversion          (Blank rows = not tested)      HOME EXERCISE PROGRAM Last updated: 08/26/22 Access Code: [email or web address] URL: [email or web address] Date: 08/26/2022 Prepared by: [facility]  Exercises - Standing Toe Taps  - 1 x daily - 5 x weekly - 2 sets - 20 reps - Alternating Step Backward with Support  - 1 x daily - 5 x weekly - 2 sets - 10 reps - Sit to Stand Without Arm Support  - 1 x daily - 5 x weekly - 2 sets - 10 reps - Standing Hip Abduction with Resistance at Ankles and Counter Support  - 1 x daily - 5 x weekly - 2 sets - 10 reps - Sitting Knee Extension with Resistance  - 1 x daily - 5 x weekly - 2 sets - 10 reps - Forward Backward Weight Shift with Counter Support  - 1 x daily - 5 x weekly - 2 sets - 10 reps - Standing March with Counter Support  - 1 x daily - 5 x weekly - 2 sets - 10 reps    Below measures were taken at time of initial evaluation unless otherwise specified:  DIAGNOSTIC FINDINGS:  03/22/22 lumbar xray: No acute findings. Multiple compression deformities are identified the within the lumbar spine and lower thoracic spine which appear unchanged when compared with CT of the chest from 02/18/2022.  COGNITION: Overall cognitive status: Impaired   SENSATION: WFL  COORDINATION: Alt pronation/supination: slightly dysmetric on L Finger to nose: intact B Heel to shin: intact B but limited trunk control  MUSCLE TONE: intact B   POSTURE: rounded shoulders, forward head, and increased thoracic kyphosis  LOWER EXTREMITY ROM:     Active  Right Eval Left Eval  Hip flexion    Hip extension    Hip abduction    Hip adduction    Hip internal rotation    Hip external rotation    Knee flexion    Knee extension  Ankle dorsiflexion 11 15  Ankle plantarflexion    Ankle inversion    Ankle eversion     (Blank rows = not tested)   LOWER EXTREMITY MMT:     MMT (in sitting) Right Eval Left Eval Right 06/29/22 Left 06/29/22 Right 07/27/22 Left 07/27/22 Right 08/26/22 Left 08/26/22  Hip flexion 4 crepitus 4 4 crepitus 4+ 4 4+ 4- 4  Hip extension            Hip abduction _0 4+ 4+ 4+ 4+  Hip adduction 4 4- 4+ 4 4+ 4 4+ 4+   Hip internal rotation            Hip external rotation            Knee flexion 4+ 4+ 4+ 4+ 4+ 4 4+ 4  Knee extension 4+ crepitus 4+ crepitus 4+ crepitus 4+ crepitus  4 4+ 4 4+  Ankle dorsiflexion 4+ 4+ 4+ 4+ 4+ 4+ 5 5  Ankle plantarflexion 4+ 4+ 4+ 4+ 4+ 4+ 4+ 4+  Ankle inversion            Ankle eversion            (Blank rows = not tested)    GAIT: Gait pattern:  R foot vaulting with forward flexed trunk and slow gait speed  Assistive device utilized: Environmental consultant - 2 wheeled Level of assistance: SBA   PATIENT EDUCATION: Education details: prognosis, POC, HEP Person educated: Patient and Child(ren) Education method: Explanation, Demonstration, Tactile cues, Verbal cues, and Handouts Education comprehension: verbalized understanding   HOME EXERCISE PROGRAM: Access Code: [email or web address] URL: [email or web address] Date: 05/20/2022 Prepared by: [facility] - Outpatient  Rehab - [facility]  Exercises - Standing Toe Taps  - 1 x daily - 5 x weekly - 2 sets - 20 reps - Alternating Step Backward with Support  - 1 x daily - 5 x weekly - 2 sets - 10 reps - Sit to Stand Without Arm Support  - 1 x daily - 5 x weekly - 2 sets - 10 reps - Standing Hip Abduction with Resistance at Ankles and Counter Support  - 1 x daily - 5 x weekly - 2 sets - 10 reps   GOALS: Goals reviewed with patient? Yes  SHORT TERM GOALS: Target date: 06/10/2022  Patient to be independent with initial HEP. Baseline: HEP initiated Goal status: MET    LONG TERM GOALS: Target date: 09/07/2022  Patient to be independent with advanced HEP. Baseline: Not yet initiated; met for current 08/26/22 Goal status: IN PROGRESS 08/26/22  Patient to demonstrate B LE strength >/=4+/5.  Baseline: See above; improving 06/29/22, improvement in B hip ABD 07/27/22; improved in L hip adduction and B dorsiflexion 08/26/22 Goal status: IN PROGRESS 08/26/22  Patient to demonstrate improved postural awareness and  correction of posture at rest and with activity. Baseline: see above; pt reports awareness while doing her exercises 06/29/22; pt reports improved awareness with ambulation 07/27/22 Goal status: MET 07/27/22  Patient to complete TUG in <18 sec with LRAD in order to decrease risk of falls.   Baseline: 21 sec; 28 sec 06/29/22, 30.94 sec 07/27/22; 24 sec 08/26/22 Goal status: IN PROGRESS 08/26/22  Patient to demonstrate 5xSTS test in <20 sec in order to decrease risk of falls.  Baseline: 26 sec; 61 sec 06/29/22, 21.38 sec 07/27/22; 21.23 sec 08/26/22 Goal status: IN PROGRESS 08/26/22  Patient to score at least 43/56 on Berg in order to decrease risk of  falls.  Baseline: 37/56 Goal status: MET 06/29/22  Patient to score at least 45/56 on Berg in order to decrease risk of falls.  Baseline: 37/56; 43/56 06/29/22, 38/56 07/27/22; 42/56 08/26/22 Goal status: IN PROGRESS 08/26/22  Patient to demonstrate alternating reciprocal stair navigation with 1 handrail with good stability.  Baseline: demonstrating step-to pattern descending with CGA-min A d/t instability and poor eccentric control; With 2 handrails; tendency to "hoist" self up taller steps R>L; limited eccentric control descending  08/26/22 Goal status: IN PROGRESS 08/26/22   ASSESSMENT:  CLINICAL IMPRESSION: Patient arrived to session without new complaints. Patient performed floor transfers with verbal cueing and demo; patient ultimately able to demonstrate safety with only CGA. Worked on hip flexor strengthening with additional balance challenges. Required occasional sit breaks d/t c/o fatigue. Worked on focusing on heel-toe pattern and longer step length with gait without device- added additional tasks while walking with considerable difficulty and guarding required. Patient tolerated session well. Spoke a about upcoming D/C at next appointment which patient and caregiver are agreeable with.    OBJECTIVE IMPAIRMENTS Abnormal gait,  decreased activity tolerance, decreased balance, decreased coordination, difficulty walking, decreased strength, decreased safety awareness, increased muscle spasms, impaired flexibility, improper body mechanics, and postural dysfunction.   ACTIVITY LIMITATIONS carrying, lifting, bending, standing, squatting, stairs, transfers, bathing, toileting, dressing, and reach over head  PARTICIPATION LIMITATIONS: meal prep, community activity, and church  PERSONAL FACTORS Age, Past/current experiences, Transportation, and 3+ comorbidities: breast CA with B mastectomy 2009, depression, Huntington disease, osteoporosis  are also affecting patient's functional outcome.   REHAB POTENTIAL: Good  CLINICAL DECISION MAKING: Evolving/moderate complexity  EVALUATION COMPLEXITY: Moderate  PLAN: PT FREQUENCY:  2 x/week  PT DURATION: other: 4 weeks  PLANNED INTERVENTIONS: Therapeutic exercises, Therapeutic activity, Neuromuscular re-education, Balance training, Gait training, Patient/Family education, Self Care, Joint mobilization, Stair training, Vestibular training, Canalith repositioning, Aquatic Therapy, Dry Needling, Cryotherapy, Moist heat, Taping, Manual therapy, and Re-evaluation  PLAN FOR NEXT SESSION: DC and HEP review   Janene Harvey, PT, DPT 08/31/22 3:46 PM  [facility] at [facility] [address], [place], [place] [address] Phone # [phone] Fax # [phone]

## 2022-09-07 ENCOUNTER — Ambulatory Visit: Payer: Medicare Other | Attending: Primary Care | Admitting: Physical Therapy

## 2022-09-07 ENCOUNTER — Encounter: Payer: Self-pay | Admitting: Physical Therapy

## 2022-09-07 DIAGNOSIS — R2689 Other abnormalities of gait and mobility: Secondary | ICD-10-CM | POA: Insufficient documentation

## 2022-09-07 DIAGNOSIS — R293 Abnormal posture: Secondary | ICD-10-CM | POA: Insufficient documentation

## 2022-09-07 DIAGNOSIS — M6281 Muscle weakness (generalized): Secondary | ICD-10-CM | POA: Insufficient documentation

## 2022-09-07 DIAGNOSIS — R29818 Other symptoms and signs involving the nervous system: Secondary | ICD-10-CM | POA: Insufficient documentation

## 2022-09-07 DIAGNOSIS — R2681 Unsteadiness on feet: Secondary | ICD-10-CM | POA: Insufficient documentation

## 2022-09-08 ENCOUNTER — Telehealth: Payer: Self-pay | Admitting: Family Medicine

## 2022-09-08 NOTE — Telephone Encounter (Signed)
Patient called and she have fallen and hurt her left wrist very badly. Patient was sent to access nurse.

## 2022-09-08 NOTE — Telephone Encounter (Signed)
Access nurse called and stated they gave patient recommendations to go to the ER after triaging her. Patient refused to go to ER.

## 2022-09-08 NOTE — Telephone Encounter (Signed)
[facility] - Client TELEPHONE ADVICE RECORD Access[facility]urse Patient Name: Barbara Krause Gender: Female DOB: 1941-06-28 Age: 81 Y 11 M 4 D Return Phone Number: [phone] (Primary) Address: City/ State/ Zip: [place] [address] Client [facility] - Client Client Site [facility] - Day Provider Waunita Schooner- MD Contact Type Call Who Is Calling Patient / Member / Family / Caregiver Call Type Triage / Clinical Relationship To Patient Self Return Phone Number [phone] (Primary) Chief Complaint Hand or Wrist Injury Reason for Call Symptomatic / Request for [place] states the patient fell and hurt her wrist very badly. Translation No Nurse Assessment Nurse: Claiborne Billings, RN, [name] Date/Time (Eastern Time): 09/08/2022 9:38:44 AM Confirm and document reason for call. If symptomatic, describe symptoms. ---Caller states she fell and hurt her right wrist on Monday night, has not been evaluated yet. No swelling. Does the patient have any new or worsening symptoms? ---Yes Will a triage be completed? ---Yes Related visit to physician within the last 2 weeks? ---No Does the PT have any chronic conditions? (i.e. diabetes, asthma, this includes High risk factors for pregnancy, etc.) ---Yes List chronic conditions. ---Huntingtons disease Is this a behavioral health or substance abuse call? ---No Guidelines Guideline Title Affirmed Question Affirmed Notes Nurse Date/Time (Eastern Time) Hand and Wrist Injury [1] Numbness (loss of sensation) of finger(s) AND [2] present now Claiborne Billings, RN, [name] 09/08/2022 9:40:26 AM Disp. Time Eilene Ghazi Time) Disposition Final User 09/08/2022 9:42:00 AM Go to ED Now Yes Claiborne Billings, RN, [name] Final Disposition 09/08/2022 9:42:00 AM Go to ED Now Yes Claiborne Billings, RN, [name] PLEASE NOTE: All timestamps contained within this report are represented as [place] Standard  Time. CONFIDENTIALTY NOTICE: This fax transmission is intended only for the addressee. It contains information that is legally privileged, confidential or otherwise protected from use or disclosure. If you are not the intended recipient, you are strictly prohibited from reviewing, disclosing, copying using or disseminating any of this information or taking any action in reliance on or regarding this information. If you have received this fax in error, please notify us immediately by telephone so that we can arrange for its return to [place]. Phone: [phone], Toll-Free: [phone], Fax: [phone] Page: 2 of 2 Call Id: [id number] [place] Disagree/Comply Disagree Caller Understands Yes PreDisposition Call Doctor Care Advice Given Per Guideline GO TO ED NOW: * You need to be seen in the Emergency Department. * Go to the ED at ___________ [place] now. Drive carefully. CARE ADVICE given per Hand and Wrist Injury (Adult) guideline. Comments User: Suezanne Jacquet, RN Date/Time Eilene Ghazi Time): 09/08/2022 9:40:40 AM Current pain 5/10. User: Suezanne Jacquet, RN Date/Time Eilene Ghazi Time): 09/08/2022 9:47:04 AM Called backline line number and provided patient's name, DOB, reason for calling, outcome, and call back number, spoke with Janett Billow, CMA and informed her that caller refused ED Now outcome. Referrals GO TO FACILITY REFUSE

## 2022-09-08 NOTE — Telephone Encounter (Addendum)
Please see note below access nurse note; I spoke with pt and she hit arm on side of door on 09/06/22.pt said rt wrist is not swollen. Pt said she is black and blue at wrist. Pt said rt wrist hurts when tries to move wrist; pain level now is 5. I spoke with Santiago Glad [phone] at [facility] [address] and pt has a placeholder time of 2:30 today at [facility] in [place]. Pt voiced understanding and appreciative of appt. Sending note to Dr Diona Browner as Juluis Rainier.

## 2022-09-08 NOTE — Telephone Encounter (Signed)
Needs first available or ER/ UC for this Thanks

## 2022-09-27 ENCOUNTER — Telehealth: Payer: Self-pay

## 2022-09-27 ENCOUNTER — Encounter: Payer: Self-pay | Admitting: Student

## 2022-09-27 NOTE — Telephone Encounter (Signed)
PC SW returned patients daughter, Ivar Drape, [place].  Daughter requested in person PC visit due to patient declining since previous PC consult. Daughter shared that patients is not eating and having trouble swallowing. Daughter states that she feels patient may be ready for hospice.   In person PC visit scheduled for Wed 11/29 '@10'$  am with RN/SW team

## 2022-09-29 ENCOUNTER — Telehealth: Payer: Self-pay | Admitting: Family Medicine

## 2022-09-29 ENCOUNTER — Other Ambulatory Visit: Payer: Medicare Other

## 2022-09-29 VITALS — BP 132/100 | HR 71 | Temp 97.6°F

## 2022-09-29 DIAGNOSIS — Z515 Encounter for palliative care: Secondary | ICD-10-CM

## 2022-09-29 NOTE — Telephone Encounter (Signed)
Almyra Free from [facility] called stating the pt's bp has been high lately. Bp levels have been ranging from 149/99 with Heart Rate of 66, 152/111 with Heart Rate of 80. Almyra Free stated she seen pt today around 10am & her bp was 132/100. Almyra Free stated pt was currently on losartan (COZAAR) 25 MG tablet for her bp. Almyra Free stated the pt stopped smoking about 2 weeks ago & was prescribed a "nicotine patch". Almyra Free stated the pt's anxiety has increased a lot since she's stopped smoking. Almyra Free stated the pt's daughter has mentioned the pt constantly goes outside about every 30 mins as if she is smoking but she isn't, pt's daughter believes is due to pt's anxiety from the end of her smoking. Almyra Free is asking if something can be prescribed for the anxiety? Almyra Free also mentioned the pt has some access drooling going on, she asked would prescribing a "scopolamine patch" be appropriate for the drooling? Call back # [phone]

## 2022-09-29 NOTE — Progress Notes (Signed)
COMMUNITY PALLIATIVE CARE SW NOTE  PATIENT NAME: Barbara Krause DOB: 1941/07/29 MRN: [id number]  PRIMARY CARE PROVIDER: Jinny Sanders, MD  RESPONSIBLE PARTY:  Acct ID - Guarantor Home Phone Work Phone Relationship Acct Type  [id number] KATHERINNE, [phone]  Self P/F     [place], [place], [place] [address]     PLAN OF CARE and INTERVENTIONS:             GOALS OF CARE/ ADVANCE CARE PLANNING:    Goals include to maximize quality of life and symptom management. Our advance care planning conversation included a discussion about:    The value and importance of advance care planning  Review and updating or creation of an advance directive document.                          Patient is a DNR. MOST form reviewed with patient, no changes indicated. Patients daughter, Lattie Haw, has [place].  2.                Palliative care encounter: SW and RN completed in home visit with patient, daughter, Lattie Haw, present as well.   Functional changes/updates: Per patient and daughter, patient has had a decline since previous PC communication. Patient reports she has having more tremors and eating a little less. Patient had a recent fall and fractured her R wrist.   Patient uses RW for ambulation Patient is [facility]- supervision with ADL's. Patient share that she has someone that comes in privately to assist with bathing, meal prep and light house keeping. Patient recently completed [facility] SLP services. Patient was recommend to cut up her foods and take small bites and sips and double chew her foods to assist with prevention of aspiration.      Psychosocial assessment: completed.   In home support: Patients currently has private caregivers everyday from 12-6pm. And sometimes from 6-9pm.  Transportation: no needs.  Food: no food insecurities witnessed.   Support: Patients daughters are supportive. Patients spouse passed away recently. Patient recently quit smoking and experiencing some anxiety due to  withdrawals. SW discussed and reviewed and smoking cessation withdrawals and S/S. Patient currently has smoking patches and utilizes lollipops, currently patient denies desire to smoke but ahs habitual activities of wanting to constantly go outside.   Safety and long term planning: patient feels safe in his home and desires to remain in her home. Patient has a life alert system. Daughter shares that patient would not be able to 24/7 care in the home whenever the time comes.  Hospice: eligibility reviewed and discussed. Patient currently not ready for hospice services.    SW discussed goals, reviewed care plan, provided emotional support, used active and reflective listening in the form of reciprocity emotional response. Questions and concerns were addressed. The patient/family was encouraged to call with any additional questions and/or concerns. PC Provided general support and encouragement, no other unmet needs identified. Will continue to follow.   3.         PATIENT/CAREGIVER EDUCATION/ COPING:   Appearance: well groomed, appropriate given situation  Mental Status: Alert/oriented. Eye Contact: Good. Able to engage in proper eye contact  Thought Process: rational  Thought Content: not assessed  Speech: slow and slurred at times.  Mood: Normal and calm Affect: Congruent to endorsed mood, full ranging Insight: normal Judgement: normal  Interaction Style: Cooperative   Patient A&O, patient engaged in fluent conversation and answered all questions appropriately.  No cognitive deficits witnessed,    4.         PERSONAL EMERGENCY PLAN:  Patient/family will call 9-1-1 for emergencies. Patient has life alert button.   5.         COMMUNITY RESOURCES COORDINATION/ HEALTH CARE NAVIGATION:  patient and daughters manages her care.    6.      FINANCIAL CONCERNS/NEEDS: None. Patient does not qualify for community Medicaid.                         Primary Health Insurance:  Medicare Secondary Health  Insurance: BCBS Prescription Coverage: Yes, no history of difficulty obtaining or affording prescriptions reported.  Patient has f/u with neurology on 10/22/22. PC will f/u with daughter after appt to discuss disease progression and whether not patient is ready for hospice, if not palliative care will DC services.   PPS:  Patient is A&O  with good insight and judgement.      SOCIAL HX:  Social History   Tobacco Use   Smoking status: Every Day    Packs/day: 0.75    Years: 50.00    Total pack years: 37.50    Types: Cigarettes   Smokeless tobacco: Never  Substance Use Topics   Alcohol use: Yes    Comment: about 1 glass of wine x 2 a week    CODE STATUS: DNR ADVANCED DIRECTIVES: Y MOST FORM COMPLETE: Y HOSPICE EDUCATION PROVIDED: Y  TIME SPENT: 1 HR      [place] Henrene Pastor, LCSW

## 2022-09-29 NOTE — Progress Notes (Signed)
PATIENT NAME: Barbara Krause DOB: 09-15-1941 MRN: [id number]  PRIMARY CARE PROVIDER: Waunita Schooner, MD  RESPONSIBLE PARTY:  Acct ID - Guarantor Home Phone Work Phone Relationship Acct Type  [id number] Barbara Krause* [phone]  Self P/F     [place], [place], [place] [address]   ACP:  Reviewed code status and MOST form.  Patient confirms wishes and no changes at this time.  Patient would like to remain in her home with hired caregivers.  Discussed hospice and patient/daughter are open to this when patient is appropriate.  Appetite:  Patient feels her appetite has declined but no noted weight loss at this time.  Currently taking Ensure 1-2x daily.  Anxiety:  Increased over the last month.  Stopped smoking earlier this month.  Now wanting to go outside about every 30 minutes to sit for a few minutes and then returns inside.  Daughter states this is new behavior and patient did not go out this often when she was smoking.  Currently using nicotine patches.  Currently on Lexapro 20 mg.  PCP updated.  Dysphagia:  High risk for aspiration.  Patient has been discharged from [facility] due to meeting goals.  She has excessive drooling at times.  Requires frequent throat clearing during visit.  Will see if scopolamine patch is appropriate.  Will ask PCP.  Functional Status:  Has a walker with right arm platform.  Has been advised to not use the walker without assistance.  Also has a wheelchair to use when no family is present.   Currently has aides in the home 7 days a week from 12-5 pm and occasionally in the evenings to assist with bedtime routine.  Patient sustained a fall around 09/16/22.  Fell at her daughter's home and hit the back of her head.  Daughter reported amnesia and confusion but patient refused ED visit and symptoms improved.  Another fall occurred earlier in the month resulting in a right wrist fracture.  Cast currently in place.  Daughter is assisting with showers weekly..  Patient is able  to dress, feed and toilet herself at this time.   HTN:  Daughter has been checking blood pressures over the last 24 hours.  This am prior to medications bp 152/111 hr 80, last night at 9 pm 149/99 hr 66.  Cozaar 25 mg qd being administered.   BP 132/100 hr 71 when checked by this writer manually.  PCP updated.  Huntington's Diease:  Worsening chorea mostly noted in the face and mouth on today's visit.  Increase in falls-2 noted this month.  Continues to lean forward involuntarily.  Closing eyes frequently during visit but continues to follow conversation and answers questions.  Follow up with neurology next month.   Hospice vs Palliative Care:  Education provided on differences between programs.  CODE STATUS: DNR ADVANCED DIRECTIVES: Yes MOST FORM: Yes PPS: 40%   PHYSICAL EXAM:   VITALS: Today's Vitals   09/29/22 1015  BP: (!) 132/100  Pulse: 71  Temp: 97.6 F (36.4 C)  SpO2: 90%    LUNGS: decreased breath sounds CARDIAC: Cor RRR}  EXTREMITIES: - for edema SKIN: Skin color, texture, turgor normal. No rashes or lesions or normal  NEURO: positive for gait problems, memory problems, speech problems, and weakness       Lorenza Burton, RN

## 2022-09-30 ENCOUNTER — Telehealth: Payer: Self-pay

## 2022-09-30 MED ORDER — LOSARTAN POTASSIUM 25 MG PO TABS: 50.0000 mg | ORAL_TABLET | Freq: Every day | ORAL | 1 refills | Status: DC

## 2022-09-30 MED ORDER — SCOPOLAMINE 1 MG/3DAYS TD PT72: 1.0000 | MEDICATED_PATCH | TRANSDERMAL | 11 refills | Status: DC

## 2022-09-30 NOTE — Addendum Note (Signed)
Addended by: Carter Kitten on: 09/30/2022 02:52 PM   Modules accepted: Orders

## 2022-09-30 NOTE — Telephone Encounter (Signed)
Call Barbara Krause from [facility] and patient.  Have her increase losartan to 50 mg daily, she can take 2 of the 25 mg tablet she already has.  Have her follow blood pressure at home with goal of less than 140/90.  For her anxiety she is already on maximum dose of Lexapro 20 mg p.o. daily.   I will send in a scopolamine patch for her to try for drooling.   I would suggest having her start the scopolamine patch and increase the blood pressure medication first.  If she continues having issues with anxiety she can make an appointment to be seen and evaluated.

## 2022-09-30 NOTE — Telephone Encounter (Signed)
223 pm.  Incoming call from Butch Penny with PCP office.  New orders received for Losartan 50 mg daily.  Monitor blood pressures with goal of < 140/90.  Scopolamine patch has been order by PCP.  If anxiety continues, patient should schedule an office visit with PCP.  Follow up call made to [place] daughter.  She is advised of above.  Daughter has requested that Losartan 50 mg be called into pharmacy as 25 mg tablets are running low.  Advised that I would contact PCP office with request.  Daughter will cancel NP visit she scheduled for tomorrow since elevated bp's are being addressed.  I have encouraged daughter to follow up with PCP should goal bp's are not reached.  Secure message sent to Butch Penny, CMA for PCP with request for losartan 50 mg to be called into pharmacy.

## 2022-09-30 NOTE — Addendum Note (Signed)
Addended by: Eliezer Lofts E on: 09/30/2022 01:33 PM   Modules accepted: Orders

## 2022-09-30 NOTE — Telephone Encounter (Signed)
Vance Gather with [facility] notified as instructed by telephone.  She will call patient's daughter and let her know.   Medication list updated.

## 2022-09-30 NOTE — Addendum Note (Signed)
Addended by: Carter Kitten on: 09/30/2022 02:26 PM   Modules accepted: Orders

## 2022-10-01 ENCOUNTER — Encounter (HOSPITAL_COMMUNITY): Payer: Self-pay

## 2022-10-01 ENCOUNTER — Inpatient Hospital Stay (HOSPITAL_COMMUNITY)
Admission: EM | Admit: 2022-10-01 | Discharge: 2022-10-04 | DRG: 064 | Disposition: A | Discharge status: Rehabilitation Facility | Payer: Medicare Other | Attending: Internal Medicine | Admitting: Internal Medicine

## 2022-10-01 ENCOUNTER — Emergency Department (HOSPITAL_COMMUNITY): Payer: Medicare Other

## 2022-10-01 ENCOUNTER — Telehealth: Payer: Self-pay

## 2022-10-01 ENCOUNTER — Ambulatory Visit: Payer: Medicare Other | Admitting: Nurse Practitioner

## 2022-10-01 DIAGNOSIS — Z8673 Personal history of transient ischemic attack (TIA), and cerebral infarction without residual deficits: Secondary | ICD-10-CM

## 2022-10-01 DIAGNOSIS — R471 Dysarthria and anarthria: Secondary | ICD-10-CM | POA: Diagnosis present

## 2022-10-01 DIAGNOSIS — I639 Cerebral infarction, unspecified: Secondary | ICD-10-CM | POA: Diagnosis not present

## 2022-10-01 DIAGNOSIS — F0390 Unspecified dementia without behavioral disturbance: Secondary | ICD-10-CM | POA: Diagnosis present

## 2022-10-01 DIAGNOSIS — R270 Ataxia, unspecified: Secondary | ICD-10-CM | POA: Diagnosis present

## 2022-10-01 DIAGNOSIS — I63541 Cerebral infarction due to unspecified occlusion or stenosis of right cerebellar artery: Secondary | ICD-10-CM | POA: Diagnosis not present

## 2022-10-01 DIAGNOSIS — Z9013 Acquired absence of bilateral breasts and nipples: Secondary | ICD-10-CM

## 2022-10-01 DIAGNOSIS — Z720 Tobacco use: Secondary | ICD-10-CM | POA: Diagnosis present

## 2022-10-01 DIAGNOSIS — Z7983 Long term (current) use of bisphosphonates: Secondary | ICD-10-CM

## 2022-10-01 DIAGNOSIS — I10 Essential (primary) hypertension: Secondary | ICD-10-CM | POA: Diagnosis present

## 2022-10-01 DIAGNOSIS — Z66 Do not resuscitate: Secondary | ICD-10-CM | POA: Diagnosis present

## 2022-10-01 DIAGNOSIS — R5383 Other fatigue: Secondary | ICD-10-CM

## 2022-10-01 DIAGNOSIS — Z823 Family history of stroke: Secondary | ICD-10-CM

## 2022-10-01 DIAGNOSIS — Z853 Personal history of malignant neoplasm of breast: Secondary | ICD-10-CM

## 2022-10-01 DIAGNOSIS — G9341 Metabolic encephalopathy: Secondary | ICD-10-CM | POA: Diagnosis present

## 2022-10-01 DIAGNOSIS — M810 Age-related osteoporosis without current pathological fracture: Secondary | ICD-10-CM | POA: Diagnosis present

## 2022-10-01 DIAGNOSIS — W19XXXA Unspecified fall, initial encounter: Secondary | ICD-10-CM

## 2022-10-01 DIAGNOSIS — R1310 Dysphagia, unspecified: Secondary | ICD-10-CM

## 2022-10-01 DIAGNOSIS — G10 Huntington's disease: Secondary | ICD-10-CM | POA: Diagnosis present

## 2022-10-01 DIAGNOSIS — E041 Nontoxic single thyroid nodule: Secondary | ICD-10-CM

## 2022-10-01 DIAGNOSIS — F0393 Unspecified dementia, unspecified severity, with mood disturbance: Secondary | ICD-10-CM | POA: Diagnosis present

## 2022-10-01 DIAGNOSIS — Z8249 Family history of ischemic heart disease and other diseases of the circulatory system: Secondary | ICD-10-CM

## 2022-10-01 DIAGNOSIS — M4802 Spinal stenosis, cervical region: Secondary | ICD-10-CM

## 2022-10-01 DIAGNOSIS — Z880 Allergy status to penicillin: Secondary | ICD-10-CM

## 2022-10-01 DIAGNOSIS — Z882 Allergy status to sulfonamides status: Secondary | ICD-10-CM

## 2022-10-01 DIAGNOSIS — E7800 Pure hypercholesterolemia, unspecified: Secondary | ICD-10-CM | POA: Diagnosis present

## 2022-10-01 DIAGNOSIS — F17210 Nicotine dependence, cigarettes, uncomplicated: Secondary | ICD-10-CM | POA: Diagnosis present

## 2022-10-01 DIAGNOSIS — F325 Major depressive disorder, single episode, in full remission: Secondary | ICD-10-CM | POA: Diagnosis present

## 2022-10-01 DIAGNOSIS — Z79899 Other long term (current) drug therapy: Secondary | ICD-10-CM

## 2022-10-01 DIAGNOSIS — R29702 NIHSS score 2: Secondary | ICD-10-CM | POA: Diagnosis present

## 2022-10-01 LAB — CBC WITH DIFFERENTIAL/PLATELET
Abs Immature Granulocytes: 0.03 10*3/uL (ref 0.00–0.07)
Basophils Absolute: 0 10*3/uL (ref 0.0–0.1)
Basophils Relative: 1 %
Eosinophils Absolute: 0 10*3/uL (ref 0.0–0.5)
Eosinophils Relative: 0 %
HCT: 37.8 % (ref 36.0–46.0)
Hemoglobin: 12.3 g/dL (ref 12.0–15.0)
Immature Granulocytes: 0 %
Lymphocytes Relative: 17 %
Lymphs Abs: 1.3 10*3/uL (ref 0.7–4.0)
MCH: 30.4 pg (ref 26.0–34.0)
MCHC: 32.5 g/dL (ref 30.0–36.0)
MCV: 93.3 fL (ref 80.0–100.0)
Monocytes Absolute: 0.8 10*3/uL (ref 0.1–1.0)
Monocytes Relative: 10 %
Neutro Abs: 5.8 10*3/uL (ref 1.7–7.7)
Neutrophils Relative %: 72 %
Platelets: 202 10*3/uL (ref 150–400)
RBC: 4.05 MIL/uL (ref 3.87–5.11)
RDW: 12.1 % (ref 11.5–15.5)
WBC: 8.1 10*3/uL (ref 4.0–10.5)
nRBC: 0 % (ref 0.0–0.2)

## 2022-10-01 LAB — COMPREHENSIVE METABOLIC PANEL
ALT: 18 U/L (ref 0–44)
AST: 22 U/L (ref 15–41)
Albumin: 3 g/dL — ABNORMAL LOW (ref 3.5–5.0)
Alkaline Phosphatase: 101 U/L (ref 38–126)
Anion gap: 12 (ref 5–15)
BUN: 18 mg/dL (ref 8–23)
CO2: 27 mmol/L (ref 22–32)
Calcium: 9.7 mg/dL (ref 8.9–10.3)
Chloride: 104 mmol/L (ref 98–111)
Creatinine, Ser: 0.86 mg/dL (ref 0.44–1.00)
GFR, Estimated: >60 mL/min (ref >60)
Glucose, Bld: 82 mg/dL (ref 70–99)
Potassium: 3.9 mmol/L (ref 3.5–5.1)
Sodium: 143 mmol/L (ref 135–145)
Total Bilirubin: 0.7 mg/dL (ref 0.3–1.2)
Total Protein: 5.9 g/dL — ABNORMAL LOW (ref 6.5–8.1)

## 2022-10-01 LAB — TSH: TSH: 1.632 u[IU]/mL (ref 0.350–4.500)

## 2022-10-01 NOTE — ED Provider Notes (Signed)
  Physical Exam  BP (!) 158/111 (BP Location: Left Arm)   Pulse 77   Temp 98.3 F (36.8 C) (Oral)   Resp (!) 24   Ht '5\' 6"'$  (1.676 m)   Wt 60.8 kg   SpO2 95%   BMI 21.63 kg/m   Physical Exam  Procedures  Procedures  ED Course / MDM   Clinical Course as of 10/03/22 1159  Fri Oct 01, 2022  1514 Assumed care from Dr. Sherry Ruffing.  81 year old female with a history of Huntington's disease and frequent falls who presents emergency department with fall as well as worsening dysarthria over the past 3 to 4 days and fatigue.  Denies any additional infectious symptoms.  Does appear to follow with palliative and has had speech difficulties from her Huntington's in the past.  Awaiting imaging and work-up for her fatigue. 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  With her difficulty walking and worsening dysphagia feel that this may be contributing to her symptoms in addition to her Huntington's so we will need further work-up.  Dr Cheral Marker from neurology will see the patient. Medicine paged for admission. [RP]  2325 Dr Rogers Blocker from hospitalist will evaluate the patient.  [RP]    Clinical Course User Index [RP] Fransico Meadow, MD   Medical Decision Making Amount and/or Complexity of Data Reviewed Labs: ordered. Radiology: ordered.  Risk Decision regarding hospitalization.     Fransico Meadow, MD 10/03/22 1159

## 2022-10-01 NOTE — Telephone Encounter (Signed)
5:30 PM: PC SW  returned daughter, Ivar Drape, [place].  Daughter shared that she was currently at the ED with patient having a CT scan and other work up done as patient reported "feeling weird" to daughter earlier today. Daughter currently awaiting to see provider to review findings.

## 2022-10-01 NOTE — H&P (Signed)
History and Physical    Patient: Barbara Krause QIO:[id number] DOB: 10-18-1941 DOA: 10/01/2022 DOS: the patient was seen and examined on 10/02/2022 PCP: Jinny Sanders, MD  Patient coming from: Home - lives alone. Uses walker    Chief Complaint: 10 day history of fall then slurred speech, off balance, confusion and other stroke like symtpoms.   HPI: Barbara Krause is a 81 y.o. female with medical history significant of breast cancer, Huntington's disease, depression who presented to ED with a few day history of trouble ambulating and dysarthria.  Daughter is present and states she fell 10 days ago. That was unusual for her. She started to drool and her eyes were closing and she was getting more lethargic. Since that time her balance has been more off and speech slurred. She has been more pacing in the house and restless. Her blood pressure has been running higher than normal (150/100). She also has a shake in her right hand that is new (patient states from her HD) and she has had some confusion that is new for her. Today she told her daughter she just didn't feel right so that is why they came today. Palliative care saw her a few days ago and thought she was normal. She feels weak, but no one side more than the other. No facial droop, but states her speech is still more slurred than her normal.    Denies any fever/chills, vision changes/headaches, chest pain or palpitations, shortness of breath or cough, abdominal pain, N/V/D, dysuria or leg swelling. No family history of CVA in her family.    She just stopped smoking 2 weeks ago. Was smoking a PPD. Uses nicotine patches and lollipops. She does not drink.   ER Course:  vitals: afebrile, bp: 127/96, HR: 82, RR: 13, oxygen: 96%RA Pertinent labs: none  CXR: no acute findings. Remote thoracic compression fractures Ct head: no acute finding  Ct cervical spine: no acute finding. Moderate spinal canal narowing at C4-C5 secondary to disc bulge. Right  thyroid nodule measuring up to 1.5cm recommend [place] MRI brain: punctate focus of acute ischemia in the right cerebellar hemisphere. No hemorrhage or mass effect.  In ED neurology consulted, TRH asked to admit.     Review of Systems: As mentioned in the history of present illness. All other systems reviewed and are negative. Past Medical History:  Diagnosis Date   Breast cancer ([place]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   Depression    History of chicken pox    History of colon polyps    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[place])    Kidney stone    Osteoporosis    Past Surgical History:  Procedure Laterality Date   CESAREAN SECTION     1977 and [place] Bilateral 2009   Social History:  reports that she has been smoking cigarettes. She has a 37.50 pack-year smoking history. She has never used smokeless tobacco. She reports current alcohol use. She reports that she does not use drugs.  Allergies  Allergen Reactions   Penicillin G Hives   Bactrim [Sulfamethoxazole-Trimethoprim] Rash    Family History  Problem Relation Age of Onset   Colon cancer Mother    Stroke Father    Heart disease Father    Ovarian cancer Sister    Healthy Daughter    Stomach cancer Maternal Grandmother    Diabetes Maternal Grandfather    Colon cancer Paternal Grandmother    Diabetes Paternal Grandfather  Healthy Daughter     Prior to Admission medications   Medication Sig Start Date End Date Taking? Authorizing Provider  alendronate (FOSAMAX) 70 MG tablet Take 1 tablet (70 mg total) by mouth every 7 (seven) days. Take with a full glass of water on an empty stomach. 01/18/22   Waunita Schooner, MD  atorvastatin (LIPITOR) 10 MG tablet TAKE 1 TABLET BY MOUTH EVERY DAY 01/29/22   Waunita Schooner, MD  AUSTEDO 12 MG TABS Take 2 tablets by mouth 2 (two) times daily. 03/31/21   [provider]  donepezil (ARICEPT) 5 MG tablet TAKE 1 TABLET (5 MG TOTAL) BY MOUTH DAILY. 07/13/22   Waunita Schooner, MD   escitalopram (LEXAPRO) 20 MG tablet TAKE 1 TABLET BY MOUTH EVERY DAY 03/08/22   Waunita Schooner, MD  losartan (COZAAR) 25 MG tablet Take 2 tablets (50 mg total) by mouth daily. 09/30/22   Bedsole, Amy E, MD  scopolamine (TRANSDERM-SCOP) 1 MG/3DAYS Place 1 patch (1.5 mg total) onto the skin every 3 (three) days. 09/30/22   Jinny Sanders, MD    Physical Exam: Vitals:   10/01/22 1837 10/01/22 1900 10/01/22 2216 10/01/22 2354  BP:  (!) 146/98 (!) 170/92   Pulse: 66 70 65   Resp: 17 (!) 22 16   Temp:    98.6 F (37 C)  TempSrc:    Oral  SpO2: 94% 92% 98%    General:  Appears calm and comfortable and is in NAD. Thin.  Eyes:  PERRL, EOMI, normal lids, iris ENT:  grossly normal hearing, lips & tongue, mmm; appropriate dentition Neck:  no LAD, masses or thyromegaly; no carotid bruits Cardiovascular:  RRR, no m/r/g. No LE edema.  Respiratory:   CTA bilaterally with no wheezes/rales/rhonchi.  Normal respiratory effort. Abdomen:  soft, NT, ND, NABS Back:   normal alignment, no CVAT Skin:  no rash or induration seen on limited exam Musculoskeletal:  grossly normal tone BUE/BLE, good ROM, no bony abnormality. Right wrist in cast.  Lower extremity:  No LE edema.  Limited foot exam with no ulcerations.  2+ distal pulses. Psychiatric:  grossly normal mood and affect, speech mildly dysarthric. appropriate, AOx3 Neurologic:  CN 2-12 grossly intact, moves all extremities in coordinated fashion, sensation intact. FTN intact on left. Can not do with right broken wrist. HTK intact bilaterally. Negative pronator drift. DTR 2+. Gait deferred.    Radiological Exams on Admission: Independently reviewed - see discussion in A/P where applicable  MR BRAIN WO CONTRAST  Result Date: 10/01/2022 CLINICAL DATA:  Slurred speech and difficulty walking EXAM: MRI HEAD WITHOUT CONTRAST TECHNIQUE: Multiplanar, multiecho pulse sequences of the brain and surrounding structures were obtained without intravenous contrast.  COMPARISON:  None Available. FINDINGS: Brain: Punctate focus of acute ischemia in the right cerebellar hemisphere. No other acute infarct. No acute or chronic hemorrhage. There is multifocal hyperintense T2-weighted signal within the white matter. Generalized volume loss. Old left cerebellar infarct. The midline structures are normal. Vascular: Major flow voids are preserved. Skull and upper cervical spine: Normal calvarium and skull base. Visualized upper cervical spine and soft tissues are normal. Sinuses/Orbits:No paranasal sinus fluid levels or advanced mucosal thickening. No mastoid or middle ear effusion. Normal orbits. IMPRESSION: 1. Punctate focus of acute ischemia in the right cerebellar hemisphere. No hemorrhage or mass effect. 2. 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 Electronically Signed   By: Ulyses Jarred M.D.   On: 10/01/2022 21:28   CT Cervical Spine Wo  Contrast  Result Date: 10/01/2022 CLINICAL DATA:  Trauma EXAM: CT CERVICAL SPINE WITHOUT CONTRAST TECHNIQUE: Multidetector CT imaging of the cervical spine was performed without intravenous contrast. Multiplanar CT image reconstructions were also generated. RADIATION DOSE REDUCTION: This exam was performed according to the departmental dose-optimization program which includes automated exposure control, adjustment of the mA and/or kV according to patient size and/or use of iterative reconstruction technique. COMPARISON:  CT C Spine 06/15/20 FINDINGS: Alignment: Normal. Skull base and vertebrae: No acute fracture. No primary bone lesion or focal pathologic process. Likely chronic superior endplate height loss at T3. Moderate degenerative changes at C4-C5. Soft tissues and spinal canal: No prevertebral fluid or swelling. No visible canal hematoma. Disc levels: Moderate spinal canal narrowing at C4-C5 secondary to a disc bulge. There are severe degenerative changes at C1-C2 on the right. Upper chest: Negative.  Other: Calcified thyroid nodule on the right measuring up to 8 mm. There is a separate more exophytic nodule along the inferior margin of the right thyroid lobe measuring up to 1.5 cm. IMPRESSION: 1. No acute cervical spine fracture or traumatic malalignment. 2. Moderate spinal canal narrowing at C4-C5 secondary to a disc bulge. 3. Severe degenerative changes at C1-C2 on the right. 4. Right thyroid nodule measuring up to 1.5 cm. Recommend further evaluation with a dedicated thyroid ultrasound, if not previously performed. Electronically Signed   By: Marin Roberts M.D.   On: 10/01/2022 15:39   CT HEAD WO CONTRAST (5MM)  Result Date: 10/01/2022 CLINICAL DATA:  Head trauma. EXAM: CT HEAD WITHOUT CONTRAST TECHNIQUE: Contiguous axial images were obtained from the base of the skull through the vertex without intravenous contrast. RADIATION DOSE REDUCTION: This exam was performed according to the departmental dose-optimization program which includes automated exposure control, adjustment of the mA and/or kV according to patient size and/or use of iterative reconstruction technique. COMPARISON:  CT head 06/15/2020.  MRI brain 06/20/2020. FINDINGS: Brain: No evidence of acute infarction, hemorrhage, hydrocephalus, extra-axial collection or mass lesion/mass effect. Again seen is moderate diffuse atrophy with compensatory dilatation of the ventricular system, unchanged. There is stable mild periventricular white matter hypodensity, likely chronic small vessel ischemic change. There is an old lacunar infarct in the right basal ganglia. There is also small chronic infarct in the left cerebellum, new from prior. Vascular: Atherosclerotic calcifications are present within the cavernous internal carotid arteries. Skull: Normal. Negative for fracture or focal lesion. Sinuses/Orbits: No acute finding. Other: None. IMPRESSION: 1. No acute intracranial process. 2. Stable moderate diffuse atrophy. 3. Stable mild chronic small vessel  ischemic change. 4. Small chronic infarct in the left cerebellum, new from prior. Electronically Signed   By: Ronney Asters M.D.   On: 10/01/2022 15:33   DG Chest 2 View  Result Date: 10/01/2022 CLINICAL DATA:  Fatigue, falls, dysarthria EXAM: CHEST - 2 VIEW COMPARISON:  06/30/2020 FINDINGS: Atherosclerotic calcification of the aortic arch. Old healed right rib fractures. Bilateral breast implants. Heart size within normal limits for AP projection. The lungs appear clear. Bony demineralization noted. Multiple remote thoracic compression fractures. No blunting of the costophrenic angles. IMPRESSION: 1. No acute cardiopulmonary disease is radiographically apparent. 2. Multiple remote thoracic compression fractures. 3. Atherosclerotic calcification of the aortic arch. Electronically Signed   By: Van Clines M.D.   On: 10/01/2022 15:15    EKG: pending.    Labs on Admission: I have personally reviewed the available labs and imaging studies at the time of the admission.  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  -place in observation on progressive for CVA work up  -Neurochecks per protocol -Neurology consulted -CTA neck/head pending -echo -lipid panel and A1C. LDL to goal in 12/2021, continue lipitor  -Continue daily aspirin 81 mg.  Plavix per neurology  -continue home blood pressure medication. Symptom onset 10 days ago  -N.p.o. until bedside swallow screen -aspiration and fall precautions  -PT/ OT/ SLP consult   Dysphagia Known history of dysphagia with risk for aspiration with her HD Has had swallow studies, followed at [facility] Understands risks, still eats regular food cut up in small pieces  Swallow study and ST eval   HTN (hypertension) CVA symptoms started 10 days ago Continue with losartan '25mg'$  daily   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[place]) Followed by [facility] Continue austedo  Fall and aspiration precautions  PT/OT   Tobacco use Quit 2 weeks ago Continue nicotine patch   High cholesterol LDL to goal in 12/2021 at 40  Continue statin   Depression, major, single episode, complete remission (HCC) Continue lexapro   Dementia ([place]) Appears mild Continue aricept daily  Delirium precautions   Right thyroid nodule Measuring upto 1.5cm Recommend outpatient thyroid US if patient wishes   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   Severity of Illness: The appropriate patient status for this patient is OBSERVATION. Observation status is judged to be reasonable and necessary in order to provide the required intensity of service to ensure the patient's safety. The patient's presenting symptoms, physical exam findings, and initial radiographic and laboratory data in the context of their medical condition is felt to place them at decreased risk for further clinical deterioration. Furthermore, it is anticipated that the patient will be medically stable for discharge from the hospital within 2 midnights of admission.   Author: Orma Flaming, MD 10/02/2022 1:05 AM  For on call review [email or web address].

## 2022-10-01 NOTE — ED Provider Notes (Signed)
[facility] EMERGENCY DEPARTMENT Provider Note   CSN: [id number] Arrival date & time: 10/01/22  1420     History  Chief Complaint  Patient presents with   Aphasia    Barbara Krause is a 81 y.o. female.  The history is provided by the patient and medical records. No language interpreter was used.  Fall This is a recurrent problem. The current episode started more than 2 days ago. The problem occurs rarely. The problem has not changed since onset.Pertinent negatives include no chest pain, no abdominal pain, no headaches and no shortness of breath. Nothing aggravates the symptoms. Nothing relieves the symptoms. She has tried nothing for the symptoms. The treatment provided no relief.       Home Medications Prior to Admission medications   Medication Sig Start Date End Date Taking? Authorizing Provider  alendronate (FOSAMAX) 70 MG tablet Take 1 tablet (70 mg total) by mouth every 7 (seven) days. Take with a full glass of water on an empty stomach. 01/18/22   Waunita Schooner, MD  atorvastatin (LIPITOR) 10 MG tablet TAKE 1 TABLET BY MOUTH EVERY DAY 01/29/22   Waunita Schooner, MD  AUSTEDO 12 MG TABS Take 2 tablets by mouth 2 (two) times daily. 03/31/21   [provider]  donepezil (ARICEPT) 5 MG tablet TAKE 1 TABLET (5 MG TOTAL) BY MOUTH DAILY. 07/13/22   Waunita Schooner, MD  escitalopram (LEXAPRO) 20 MG tablet TAKE 1 TABLET BY MOUTH EVERY DAY 03/08/22   Waunita Schooner, MD  losartan (COZAAR) 25 MG tablet Take 2 tablets (50 mg total) by mouth daily. 09/30/22   Bedsole, Amy E, MD  scopolamine (TRANSDERM-SCOP) 1 MG/3DAYS Place 1 patch (1.5 mg total) onto the skin every 3 (three) days. 09/30/22   Jinny Sanders, MD      Allergies    Penicillin g and Bactrim [sulfamethoxazole-trimethoprim]    Review of Systems   Review of Systems  Constitutional:  Positive for fatigue. Negative for chills, diaphoresis and fever.  Eyes:  Negative for visual disturbance.  Respiratory:   Negative for chest tightness and shortness of breath.   Cardiovascular:  Negative for chest pain and palpitations.  Gastrointestinal:  Negative for abdominal pain, constipation, diarrhea, nausea and vomiting.  Genitourinary:  Negative for dysuria and flank pain.  Musculoskeletal:  Negative for back pain, neck pain and neck stiffness.  Skin:  Negative for rash and wound.  Neurological:  Positive for speech difficulty. Negative for seizures, weakness, light-headedness, numbness and headaches.  Psychiatric/Behavioral:  Negative for agitation and confusion.   All other systems reviewed and are negative.   Physical Exam Updated Vital Signs BP (!) 127/96 (BP Location: Left Arm)   Pulse 82   Temp 98.2 F (36.8 C) (Oral)   Resp 13   SpO2 96%  Physical Exam Vitals and nursing note reviewed.  Constitutional:      General: She is not in acute distress.    Appearance: She is well-developed. She is not ill-appearing, toxic-appearing or diaphoretic.  HENT:     Head: Normocephalic and atraumatic.  Eyes:     Conjunctiva/sclera: Conjunctivae normal.  Cardiovascular:     Rate and Rhythm: Normal rate and regular rhythm.     Heart sounds: No murmur heard. Pulmonary:     Effort: Pulmonary effort is normal. No respiratory distress.     Breath sounds: Normal breath sounds. No wheezing, rhonchi or rales.  Chest:     Chest wall: No tenderness.  Abdominal:  General: Abdomen is flat.     Palpations: Abdomen is soft.     Tenderness: There is no abdominal tenderness. There is no right CVA tenderness, left CVA tenderness, guarding or rebound.  Musculoskeletal:        General: No swelling or tenderness.     Cervical back: Neck supple.     Right lower leg: No edema.     Left lower leg: No edema.  Skin:    General: Skin is warm and dry.     Capillary Refill: Capillary refill takes less than 2 seconds.     Findings: No erythema or rash.  Neurological:     Mental Status: She is alert.     Cranial  Nerves: Dysarthria present. No facial asymmetry.     Sensory: No sensory deficit.     Motor: No weakness, abnormal muscle tone or seizure activity.     Comments: Patient has some abnormal sounding speech which she reports is new.  Psychiatric:        Mood and Affect: Mood normal.     ED Results / Procedures / Treatments   Labs (all labs ordered are listed, but only abnormal results are displayed) Labs Reviewed  CBC WITH DIFFERENTIAL/PLATELET  COMPREHENSIVE METABOLIC PANEL  URINALYSIS, ROUTINE W REFLEX MICROSCOPIC  TSH    EKG None  Radiology DG Chest 2 View  Result Date: 10/01/2022 CLINICAL DATA:  Fatigue, falls, dysarthria EXAM: CHEST - 2 VIEW COMPARISON:  06/30/2020 FINDINGS: Atherosclerotic calcification of the aortic arch. Old healed right rib fractures. Bilateral breast implants. Heart size within normal limits for AP projection. The lungs appear clear. Bony demineralization noted. Multiple remote thoracic compression fractures. No blunting of the costophrenic angles. IMPRESSION: 1. No acute cardiopulmonary disease is radiographically apparent. 2. Multiple remote thoracic compression fractures. 3. Atherosclerotic calcification of the aortic arch. Electronically Signed   By: Van Clines M.D.   On: 10/01/2022 15:15    Procedures Procedures    Medications Ordered in ED Medications - No data to display  ED Course/ Medical Decision Making/ A&P Clinical Course as of 10/01/22 1525  Fri Oct 01, 2022  1514 Assumed care from Dr. Sherry Ruffing.  81 year old female with a history of Huntington's disease and frequent falls who presents emergency department with fall as well as worsening dysarthria over the past 3 to 4 days and fatigue.  Denies any additional infectious symptoms.  Does appear to follow with palliative and has had speech difficulties from her Huntington's in the past.  Awaiting imaging and work-up for her fatigue. [RP]    Clinical Course User Index [RP] Barbara Meadow, MD                           Medical Decision Making Amount and/or Complexity of Data Reviewed Labs: ordered. Radiology: ordered.    Barbara Krause is a 81 y.o. female with a past medical history significant for hypertension, Huntington's disease, dementia, kidney stones, depression, previous breast cancer, and emphysema who presents for recurrent fall and subsequent speech changes and somnolence.  According to patient she is feeling very tired and fatigued and for the last few days has had changes with her speech sounding different.  She denies any difficulty getting what she wants to say out it just sounds different.  She denies any headache or neck pain after hitting her head on Monday and she reports did not lose consciousness.  She has Huntington's and reports frequent falls.  She denies any vision changes, nausea, vomiting, constipation, diarrhea, or urinary changes.  She denies any fevers, chills, or cough.  She just feels very drained and tired and sleepy.  On exam, lungs were clear.  Chest was nontender.  Abdomen nontender.  She is moving all extremities with intact sensation and strength.  She had a symmetric smile.  She did have some dysarthria with abnormal mouth movements with speech which she reports is new for her.  She had symmetric pupils and her extraocular movements were intact.  No evidence of acute trauma with no laceration seen.  Patient otherwise resting comfortably in a hall bed.  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  Unclear if this is the cause or if she is truly having dysarthria.  Given her recent head injury, will get CT head and neck and will get other screening work-up to look for other causes of her somnolence and altered mental status.  Care transferred to oncoming team to await results of imaging and labs.  Anticipate patient may need MRI if her symptoms persist and  the CT head does not show any concerning findings.  Care transferred to oncoming team.         Final Clinical Impression(s) / ED Diagnoses Final diagnoses:  Dysarthria  Fatigue, unspecified type  Fall, initial encounter   Clinical Impression: 1. Dysarthria   2. Fatigue, unspecified type   3. Fall, initial encounter     Disposition: Care transferred to oncoming team to await diagnostic imaging and lab results to determine etiology of mental status changes with his fatigue and sleepiness as well as speech abnormalities.  This note was prepared with assistance of Systems analyst. Occasional wrong-word or sound-a-like substitutions may have occurred due to the inherent limitations of voice recognition software.        Izik Bingman, Gwenyth Allegra, MD 10/01/22 [phone]

## 2022-10-01 NOTE — ED Triage Notes (Addendum)
Pt BIB [facility] from home d/t slurred speech that started on Sunday. Pt had a fall on Sunday d/y a syncopal episode. Did not get it checked out. Pt did hit her head when she fell, and the slurred speech and drowsiness have progressed since the fall.no N/V. A&O X4. VSS. Cbg 106

## 2022-10-02 ENCOUNTER — Observation Stay (HOSPITAL_COMMUNITY): Payer: Medicare Other

## 2022-10-02 DIAGNOSIS — S52501S Unspecified fracture of the lower end of right radius, sequela: Secondary | ICD-10-CM | POA: Diagnosis not present

## 2022-10-02 DIAGNOSIS — Z8249 Family history of ischemic heart disease and other diseases of the circulatory system: Secondary | ICD-10-CM | POA: Diagnosis not present

## 2022-10-02 DIAGNOSIS — F17210 Nicotine dependence, cigarettes, uncomplicated: Secondary | ICD-10-CM | POA: Diagnosis present

## 2022-10-02 DIAGNOSIS — Z79899 Other long term (current) drug therapy: Secondary | ICD-10-CM | POA: Diagnosis not present

## 2022-10-02 DIAGNOSIS — R29702 NIHSS score 2: Secondary | ICD-10-CM | POA: Diagnosis present

## 2022-10-02 DIAGNOSIS — Z9013 Acquired absence of bilateral breasts and nipples: Secondary | ICD-10-CM | POA: Diagnosis not present

## 2022-10-02 DIAGNOSIS — R471 Dysarthria and anarthria: Secondary | ICD-10-CM | POA: Diagnosis present

## 2022-10-02 DIAGNOSIS — I10 Essential (primary) hypertension: Secondary | ICD-10-CM | POA: Diagnosis present

## 2022-10-02 DIAGNOSIS — J189 Pneumonia, unspecified organism: Secondary | ICD-10-CM | POA: Diagnosis not present

## 2022-10-02 DIAGNOSIS — I69320 Aphasia following cerebral infarction: Secondary | ICD-10-CM | POA: Diagnosis not present

## 2022-10-02 DIAGNOSIS — F0393 Unspecified dementia, unspecified severity, with mood disturbance: Secondary | ICD-10-CM | POA: Diagnosis present

## 2022-10-02 DIAGNOSIS — R5381 Other malaise: Secondary | ICD-10-CM | POA: Diagnosis present

## 2022-10-02 DIAGNOSIS — Z7189 Other specified counseling: Secondary | ICD-10-CM | POA: Diagnosis not present

## 2022-10-02 DIAGNOSIS — F325 Major depressive disorder, single episode, in full remission: Secondary | ICD-10-CM | POA: Diagnosis present

## 2022-10-02 DIAGNOSIS — Z882 Allergy status to sulfonamides status: Secondary | ICD-10-CM | POA: Diagnosis not present

## 2022-10-02 DIAGNOSIS — K5900 Constipation, unspecified: Secondary | ICD-10-CM | POA: Diagnosis not present

## 2022-10-02 DIAGNOSIS — F03A0 Unspecified dementia, mild, without behavioral disturbance, psychotic disturbance, mood disturbance, and anxiety: Secondary | ICD-10-CM | POA: Diagnosis present

## 2022-10-02 DIAGNOSIS — I639 Cerebral infarction, unspecified: Secondary | ICD-10-CM | POA: Diagnosis present

## 2022-10-02 DIAGNOSIS — I69391 Dysphagia following cerebral infarction: Secondary | ICD-10-CM | POA: Diagnosis not present

## 2022-10-02 DIAGNOSIS — R1310 Dysphagia, unspecified: Secondary | ICD-10-CM | POA: Diagnosis present

## 2022-10-02 DIAGNOSIS — E785 Hyperlipidemia, unspecified: Secondary | ICD-10-CM | POA: Diagnosis present

## 2022-10-02 DIAGNOSIS — E7800 Pure hypercholesterolemia, unspecified: Secondary | ICD-10-CM | POA: Diagnosis present

## 2022-10-02 DIAGNOSIS — M810 Age-related osteoporosis without current pathological fracture: Secondary | ICD-10-CM | POA: Diagnosis present

## 2022-10-02 DIAGNOSIS — I63541 Cerebral infarction due to unspecified occlusion or stenosis of right cerebellar artery: Secondary | ICD-10-CM | POA: Diagnosis present

## 2022-10-02 DIAGNOSIS — E041 Nontoxic single thyroid nodule: Secondary | ICD-10-CM | POA: Diagnosis present

## 2022-10-02 DIAGNOSIS — Z823 Family history of stroke: Secondary | ICD-10-CM | POA: Diagnosis not present

## 2022-10-02 DIAGNOSIS — L89892 Pressure ulcer of other site, stage 2: Secondary | ICD-10-CM | POA: Diagnosis not present

## 2022-10-02 DIAGNOSIS — Z515 Encounter for palliative care: Secondary | ICD-10-CM | POA: Diagnosis not present

## 2022-10-02 DIAGNOSIS — E440 Moderate protein-calorie malnutrition: Secondary | ICD-10-CM | POA: Diagnosis present

## 2022-10-02 DIAGNOSIS — I6389 Other cerebral infarction: Secondary | ICD-10-CM | POA: Diagnosis not present

## 2022-10-02 DIAGNOSIS — E876 Hypokalemia: Secondary | ICD-10-CM | POA: Diagnosis not present

## 2022-10-02 DIAGNOSIS — R7989 Other specified abnormal findings of blood chemistry: Secondary | ICD-10-CM | POA: Diagnosis not present

## 2022-10-02 DIAGNOSIS — G10 Huntington's disease: Secondary | ICD-10-CM | POA: Diagnosis present

## 2022-10-02 DIAGNOSIS — F32A Depression, unspecified: Secondary | ICD-10-CM | POA: Diagnosis present

## 2022-10-02 DIAGNOSIS — Z7983 Long term (current) use of bisphosphonates: Secondary | ICD-10-CM | POA: Diagnosis not present

## 2022-10-02 DIAGNOSIS — Z8673 Personal history of transient ischemic attack (TIA), and cerebral infarction without residual deficits: Secondary | ICD-10-CM | POA: Diagnosis not present

## 2022-10-02 DIAGNOSIS — B370 Candidal stomatitis: Secondary | ICD-10-CM | POA: Diagnosis present

## 2022-10-02 DIAGNOSIS — S52501D Unspecified fracture of the lower end of right radius, subsequent encounter for closed fracture with routine healing: Secondary | ICD-10-CM | POA: Diagnosis not present

## 2022-10-02 DIAGNOSIS — Z66 Do not resuscitate: Secondary | ICD-10-CM | POA: Diagnosis present

## 2022-10-02 DIAGNOSIS — Z880 Allergy status to penicillin: Secondary | ICD-10-CM | POA: Diagnosis not present

## 2022-10-02 DIAGNOSIS — G9341 Metabolic encephalopathy: Secondary | ICD-10-CM | POA: Diagnosis present

## 2022-10-02 DIAGNOSIS — R051 Acute cough: Secondary | ICD-10-CM | POA: Diagnosis not present

## 2022-10-02 DIAGNOSIS — W19XXXD Unspecified fall, subsequent encounter: Secondary | ICD-10-CM | POA: Diagnosis present

## 2022-10-02 DIAGNOSIS — Z853 Personal history of malignant neoplasm of breast: Secondary | ICD-10-CM | POA: Diagnosis not present

## 2022-10-02 DIAGNOSIS — F03A3 Unspecified dementia, mild, with mood disturbance: Secondary | ICD-10-CM | POA: Diagnosis present

## 2022-10-02 DIAGNOSIS — M4802 Spinal stenosis, cervical region: Secondary | ICD-10-CM | POA: Diagnosis present

## 2022-10-02 LAB — LIPID PANEL
Cholesterol: 133 mg/dL (ref 0–200)
HDL: 59 mg/dL (ref >40)
LDL Cholesterol: 57 mg/dL (ref 0–99)
Total CHOL/HDL Ratio: 2.3 RATIO
Triglycerides: 85 mg/dL (ref <150)
VLDL: 17 mg/dL (ref 0–40)

## 2022-10-02 LAB — URINALYSIS, ROUTINE W REFLEX MICROSCOPIC
Bilirubin Urine: NEGATIVE
Glucose, UA: NEGATIVE mg/dL
Hgb urine dipstick: NEGATIVE
Ketones, ur: NEGATIVE mg/dL
Leukocytes,Ua: NEGATIVE
Nitrite: NEGATIVE
Protein, ur: NEGATIVE mg/dL
Specific Gravity, Urine: 1.028 (ref 1.005–1.030)
pH: 7 (ref 5.0–8.0)

## 2022-10-02 LAB — PROTIME-INR
INR: 1.1 (ref 0.8–1.2)
Prothrombin Time: 13.8 seconds (ref 11.4–15.2)

## 2022-10-02 LAB — GLUCOSE, CAPILLARY: Glucose-Capillary: 110 mg/dL — ABNORMAL HIGH (ref 70–99)

## 2022-10-02 LAB — BASIC METABOLIC PANEL
Anion gap: 8 (ref 5–15)
BUN: 14 mg/dL (ref 8–23)
CO2: 26 mmol/L (ref 22–32)
Calcium: 8.7 mg/dL — ABNORMAL LOW (ref 8.9–10.3)
Chloride: 108 mmol/L (ref 98–111)
Creatinine, Ser: 0.77 mg/dL (ref 0.44–1.00)
GFR, Estimated: >60 mL/min (ref >60)
Glucose, Bld: 104 mg/dL — ABNORMAL HIGH (ref 70–99)
Potassium: 3.8 mmol/L (ref 3.5–5.1)
Sodium: 142 mmol/L (ref 135–145)

## 2022-10-02 MED ORDER — STROKE: EARLY STAGES OF RECOVERY BOOK
Freq: Once | Status: AC
  Filled 2022-10-02: qty 1

## 2022-10-02 MED ORDER — NICOTINE 21 MG/24HR TD PT24
21.0000 mg | MEDICATED_PATCH | Freq: Every day | TRANSDERMAL | Status: DC
  Administered 2022-10-02 – 2022-10-04 (×4): 21 mg via TRANSDERMAL
  Filled 2022-10-02 (×5): qty 1

## 2022-10-02 MED ORDER — ACETAMINOPHEN 325 MG PO TABS: 650.0000 mg | ORAL_TABLET | ORAL | Status: DC | PRN

## 2022-10-02 MED ORDER — ASPIRIN 81 MG PO TBEC
81.0000 mg | DELAYED_RELEASE_TABLET | Freq: Every day | ORAL | Status: DC
  Administered 2022-10-02 – 2022-10-04 (×3): 81 mg via ORAL
  Filled 2022-10-02 (×3): qty 1

## 2022-10-02 MED ORDER — DEUTETRABENAZINE 12 MG PO TABS
2.0000 | ORAL_TABLET | Freq: Two times a day (BID) | ORAL | Status: DC
  Administered 2022-10-02 – 2022-10-04 (×5): 2 via ORAL
  Filled 2022-10-02 (×8): qty 2

## 2022-10-02 MED ORDER — ACETAMINOPHEN 160 MG/5ML PO SOLN: 650.0000 mg | ORAL | Status: DC | PRN

## 2022-10-02 MED ORDER — SENNOSIDES-DOCUSATE SODIUM 8.6-50 MG PO TABS: 1.0000 | ORAL_TABLET | Freq: Every evening | ORAL | Status: DC | PRN

## 2022-10-02 MED ORDER — LOSARTAN POTASSIUM 50 MG PO TABS
50.0000 mg | ORAL_TABLET | Freq: Every day | ORAL | Status: DC
  Administered 2022-10-02 – 2022-10-04 (×3): 50 mg via ORAL
  Filled 2022-10-02 (×3): qty 1

## 2022-10-02 MED ORDER — ENOXAPARIN SODIUM 40 MG/0.4ML IJ SOSY
40.0000 mg | PREFILLED_SYRINGE | INTRAMUSCULAR | Status: DC
  Administered 2022-10-02 – 2022-10-04 (×3): 40 mg via SUBCUTANEOUS
  Filled 2022-10-02 (×3): qty 0.4

## 2022-10-02 MED ORDER — ESCITALOPRAM OXALATE 10 MG PO TABS
20.0000 mg | ORAL_TABLET | Freq: Every day | ORAL | Status: DC
  Administered 2022-10-02 – 2022-10-04 (×3): 20 mg via ORAL
  Filled 2022-10-02 (×3): qty 2

## 2022-10-02 MED ORDER — IOHEXOL 350 MG/ML SOLN
75.0000 mL | Freq: Once | INTRAVENOUS | Status: AC | PRN
  Administered 2022-10-02: 75 mL via INTRAVENOUS

## 2022-10-02 MED ORDER — ACETAMINOPHEN 650 MG RE SUPP: 650.0000 mg | RECTAL | Status: DC | PRN

## 2022-10-02 MED ORDER — ATORVASTATIN CALCIUM 10 MG PO TABS
10.0000 mg | ORAL_TABLET | Freq: Every day | ORAL | Status: DC
  Administered 2022-10-02 – 2022-10-03 (×2): 10 mg via ORAL
  Filled 2022-10-02 (×2): qty 1

## 2022-10-02 MED ORDER — DONEPEZIL HCL 10 MG PO TABS
5.0000 mg | ORAL_TABLET | Freq: Every day | ORAL | Status: DC
  Administered 2022-10-02 – 2022-10-04 (×3): 5 mg via ORAL
  Filled 2022-10-02 (×3): qty 1

## 2022-10-02 NOTE — Assessment & Plan Note (Signed)
Follow up outpatient ?

## 2022-10-02 NOTE — Progress Notes (Signed)
Patient arrived to the unit alert and oriented X4, daughter at bedside. Denies any pain/discomfort. Cardiac telemetry connected to CCMD. Bed alarms on, call bell within reach. Bedside swallow done. Urine sample collected for urinalysis.

## 2022-10-02 NOTE — Evaluation (Signed)
Physical Therapy Evaluation Patient Details Name: Barbara Krause MRN: [id number] DOB: [birth year] Today's Date: 10/02/2022  History of Present Illness  Patient is 81 y.o. female who presented to ED with a few day history of trouble ambulating and dysarthria. PMH significant of breast cancer s/p total mastectomy, Huntington's disease, depression. Patient had a fall 10 PTA. MRI reveals punctate focus of acute ischemia in the right cerebellar hemisphere and old left cerebellar infarct and findings of chronic small vessel ischemia.   Clinical Impression  Barbara Krause is 81 y.o. female admitted with above HPI and diagnosis. Patient is currently limited by functional impairments below (see PT problem list). Patient lives alone and is able to get herself OOB and dressed in the morning at baseline. She has a home aid and her daughters assist during the day for meals and mobility. Pt uses RW at baseline and since a fall and Rt wrist fracture ~3 weeks ago pt has had a platform on walker for Rt UE NWB through wrist. Patient will benefit from continued skilled PT interventions to address impairments and progress independence with mobility, recommending intense rehab follow up at AIR setting. Acute PT will follow and progress as able.       Recommendations for follow up therapy are one component of a multi-disciplinary discharge planning process, led by the attending physician.  Recommendations may be updated based on patient status, additional functional criteria and insurance authorization.  Follow Up Recommendations Acute inpatient rehab (3hours/day)      Assistance Recommended at Discharge Frequent or constant Supervision/Assistance  Patient can return home with the following  A little help with walking and/or transfers;A little help with bathing/dressing/bathroom;Assistance with cooking/housework;Direct supervision/assist for medications management;Assist for transportation;Help with stairs or ramp for  entrance    Equipment Recommendations None recommended by PT  Recommendations for Other Services  Rehab consult    Functional Status Assessment Patient has had a recent decline in their functional status and demonstrates the ability to make significant improvements in function in a reasonable and predictable amount of time.     Precautions / Restrictions Precautions Precautions: Fall Restrictions Weight Bearing Restrictions: Yes RUE Weight Bearing: Weight bear through elbow only Other Position/Activity Restrictions: RT Wrist fracture ~3 weeks ago      Mobility  Bed Mobility               General bed mobility comments: OOB in bathroom at arrival and then sat EOB.    Transfers Overall transfer level: Needs assistance Equipment used: Rolling walker (2 wheels), Right platform walker Transfers: Sit to/from Stand Sit to Stand: Min assist      Assist to initiate and steady with rise from low toilet and from EOB. cues for hand placement on grab bar to pull up in bathroom.           Ambulation/Gait Ambulation/Gait assistance: Min assist Gait Distance (Feet): 15 Feet (2x15) Assistive device: Rolling walker (2 wheels), Right platform walker Gait Pattern/deviations: Step-through pattern, Decreased step length - right, Decreased step length - left, Shuffle, Narrow base of support, Ataxic Gait velocity: decr    pt ambulated 2 short bouts in room with RW, HR max of 121 bpm moving in/out of Afib rhythm throughout session. cues and assist to keep walker in safe proximity and guide direction with turns. platform fixed to walker for NWB of Rt UE.     Stairs            Wheelchair Mobility    Modified Rankin (Stroke  Patients Only)       Balance Overall balance assessment: Needs assistance, History of Falls Sitting-balance support: Feet supported Sitting balance-Leahy Scale: Fair     Standing balance support: During functional activity, Reliant on assistive device  for balance, Bilateral upper extremity supported Standing balance-Leahy Scale: Poor Standing balance comment: reliant on external assist                             Pertinent Vitals/Pain Pain Assessment Pain Assessment: No/denies pain    Home Living Family/patient expects to be discharged to:: Private residence Living Arrangements: Alone Available Help at Discharge: Family;Personal care attendant Type of Home: House Home Access: Stairs to enter Entrance Stairs-Rails: Can reach both Entrance Stairs-Number of Steps: 3   Home Layout: One level Home Equipment: Conservation officer, nature (2 wheels);Wheelchair - manual;Grab bars - tub/shower;Grab bars - toilet;Shower seat;BSC/3in1 Additional Comments: Pt has a home aid to 7x/wk for ~4 hours per day. She has assist form her daugthers as well. Someone is with her from ~10am-6pm and then 6pm-9pm.    Prior Function Prior Level of Function : Needs assist       Physical Assist : Mobility (physical);ADLs (physical)     Mobility Comments: pt using RW with platform on Rt side for recent Rt wrist fracture. Pt should be NWB on Rt wrist. ADLs Comments: pt is able to dress self and has an aid for ~4 hours per day and family there at other times. Pt typically gets herself up in the mornign, gets dressed, ambulates to living room or kitchen from bedroom. Waits for aid or family to arrive around 10am. does not eat a whol lot and doesn't fix a breakfast for herself.     Hand Dominance        Extremity/Trunk Assessment   Upper Extremity Assessment Upper Extremity Assessment: Defer to OT evaluation    Lower Extremity Assessment Lower Extremity Assessment: Generalized weakness;RLE deficits/detail;LLE deficits/detail RLE Deficits / Details: generalized 3+/5 for hip flexion, knee ext, and ankle DF/PF. RLE Sensation: WNL RLE Coordination: decreased gross motor LLE Deficits / Details: generalized 3+/5 for hip flexion, knee ext, and ankle  DF/PF. LLE Sensation: WNL LLE Coordination: decreased gross motor    Cervical / Trunk Assessment Cervical / Trunk Assessment: Kyphotic  Communication   Communication: Receptive difficulties (pt also drooling)  Cognition Arousal/Alertness: Awake/alert Behavior During Therapy: Flat affect (smiling at times and responding appropriate to questions) Overall Cognitive Status: Impaired/Different from baseline Area of Impairment: Following commands, Safety/judgement, Awareness, Problem solving                       Following Commands: Follows one step commands with increased time, Follows one step commands consistently, Follows multi-step commands inconsistently   Awareness: Emergent Problem Solving: Slow processing, Requires verbal cues          General Comments      Exercises     Assessment/Plan    PT Assessment Patient needs continued PT services  PT Problem List Decreased strength;Decreased activity tolerance;Decreased balance;Decreased mobility;Decreased coordination;Decreased cognition;Decreased knowledge of use of DME       PT Treatment Interventions DME instruction;Gait training;Stair training;Functional mobility training;Therapeutic activities;Therapeutic exercise;Balance training;Neuromuscular re-education;Patient/family education;Cognitive remediation    PT Goals (Current goals can be found in the Care Plan section)  Acute Rehab PT Goals Patient Stated Goal: regain strength and return home PT Goal Formulation: With patient/family Time For Goal Achievement: 10/16/22 Potential to  Achieve Goals: Good    Frequency Min 4X/week     Co-evaluation               AM-PAC PT "6 Clicks" Mobility  Outcome Measure Help needed turning from your back to your side while in a flat bed without using bedrails?: A Little Help needed moving from lying on your back to sitting on the side of a flat bed without using bedrails?: A Little Help needed moving to and from a  bed to a chair (including a wheelchair)?: A Little Help needed standing up from a chair using your arms (e.g., wheelchair or bedside chair)?: A Little Help needed to walk in hospital room?: A Little Help needed climbing 3-5 steps with a railing? : A Lot 6 Click Score: 17    End of Session Equipment Utilized During Treatment: Gait belt Activity Tolerance: Patient tolerated treatment well Patient left: in chair;with call bell/phone within reach;with chair alarm set;with family/visitor present Nurse Communication: Mobility status;Weight bearing status (Rt UE NWB) PT Visit Diagnosis: Unsteadiness on feet (R26.81);Muscle weakness (generalized) (M62.81);Difficulty in walking, not elsewhere classified (R26.2)    Time: 2992-4268 PT Time Calculation (min) (ACUTE ONLY): 27 min   Charges:   PT Evaluation $PT Eval Moderate Complexity: 1 Mod PT Treatments $Gait Training: 8-22 mins        Verner Mould, DPT Acute Rehabilitation Services Office [phone]  10/02/22 9:53 AM

## 2022-10-02 NOTE — Assessment & Plan Note (Signed)
Continue lexapro  ?

## 2022-10-02 NOTE — Evaluation (Signed)
Occupational Therapy Evaluation Patient Details Name: Barbara Krause MRN: [id number] DOB: Sep 04, 1941 Today's Date: 10/02/2022   History of Present Illness Patient is 81 y.o. female who presented to ED with a few day history of trouble ambulating and dysarthria. PMH significant of breast cancer s/p total mastectomy, Huntington's disease, depression. Patient had a fall 10 PTA. MRI reveals punctate focus of acute ischemia in the right cerebellar hemisphere and old left cerebellar infarct and findings of chronic small vessel ischemia.   Clinical Impression   Patient is currently requiring assistance with ADLs including minimal assist with Lower body ADLs, minimal assist with Upper body ADLs,  as well as  supervision assist with bed mobility and minimal assist with functional transfers to toilet with one LOB due to pt having difficulty with walker proximity despite near constant multimodal cuing.   Current level of function is below patient's typical baseline.  During this evaluation, patient was limited by generalized weakness with poor eccentric control when lowering to toilet, impaired activity tolerance, impaired safety awareness, and expressive difficulties with continued inability to control secretions and need for wash cloth in good hand which also distracts pt from ambulation and ADLs, all of which has the potential to impact patient's safety and independence during functional mobility, as well as performance for ADLs.  Patient lives at home, along with a CG 7x/week x 4 hours which pt's family plans on increasing (see note), and two daughters provide additional PRN supervision and assistance.  One of pt's daughters is an occupational therapist. Patient demonstrates good rehab potential, and should benefit from continued skilled occupational therapy services while in acute care to maximize safety, independence and quality of life at home.  Continued occupational therapy services in a AIR setting prior to  return home is recommended.  ?     Recommendations for follow up therapy are one component of a multi-disciplinary discharge planning process, led by the attending physician.  Recommendations may be updated based on patient status, additional functional criteria and insurance authorization.   Follow Up Recommendations  Acute inpatient rehab (3hours/day)     Assistance Recommended at Discharge Frequent or constant Supervision/Assistance  Patient can return home with the following A little help with bathing/dressing/bathroom;A little help with walking and/or transfers;Assistance with cooking/housework;Assist for transportation;Direct supervision/assist for financial management;Direct supervision/assist for medications management    Functional Status Assessment  Patient has had a recent decline in their functional status and demonstrates the ability to make significant improvements in function in a reasonable and predictable amount of time.  Equipment Recommendations  BSC/3in1    Recommendations for Other Services Rehab consult     Precautions / Restrictions Precautions Precautions: Fall Restrictions Weight Bearing Restrictions: Yes RUE Weight Bearing: Weight bear through elbow only Other Position/Activity Restrictions: RT Wrist fracture ~3 weeks ago      Mobility Bed Mobility Overal bed mobility: Needs Assistance Bed Mobility: Sit to Supine       Sit to supine: Supervision        Transfers                          Balance Overall balance assessment: Needs assistance, History of Falls Sitting-balance support: Feet supported Sitting balance-Leahy Scale: Fair     Standing balance support: During functional activity, Reliant on assistive device for balance, Bilateral upper extremity supported Standing balance-Leahy Scale: Poor  ADL either performed or assessed with clinical judgement   ADL Overall ADL's : Needs  assistance/impaired Eating/Feeding: Set up;Minimal assistance;Sitting   Grooming: Standing;Wash/dry hands;Minimal assistance;Cueing for safety   Upper Body Bathing: Min guard;Sitting   Lower Body Bathing: Minimal assistance;Sitting/lateral leans;Sit to/from stand   Upper Body Dressing : Minimal assistance;Sitting   Lower Body Dressing: Minimal assistance Lower Body Dressing Details (indicate cue type and reason): Underwear and pants clothing management for toileting. Min As for both task and balance Toilet Transfer: Minimal assistance;Regular Toilet;Grab bars;Moderate assistance Toilet Transfer Details (indicate cue type and reason): Moderate assist to descend to toilet and to EOB with impaired ececntric control. Min As to ascend from recliner and toilet. Toileting- Clothing Manipulation and Hygiene: Sitting/lateral lean;Min guard       Functional mobility during ADLs: Minimal assistance;Rolling walker (2 wheels);Moderate assistance (Platform RW)       Vision Ability to See in Adequate Light: 0 Adequate Patient Visual Report: No change from baseline Vision Assessment?: No apparent visual deficits     Perception     Praxis      Pertinent Vitals/Pain Pain Assessment Pain Assessment: No/denies pain     Hand Dominance Right   Extremity/Trunk Assessment Upper Extremity Assessment Upper Extremity Assessment: RUE deficits/detail;LUE deficits/detail RUE Deficits / Details: Cast from forearm to hand due to wrist fracture. Able able to bind RT fingers around platform walker handle. Able to raise RT UE at shoulder to 90* RUE: Unable to fully assess due to immobilization RUE Coordination: decreased fine motor LUE Deficits / Details: Noted intention tremor (H/o Huntington's), and CMC OA LUE Coordination: decreased fine motor   Lower Extremity Assessment Lower Extremity Assessment: Generalized weakness;RLE deficits/detail;LLE deficits/detail RLE Deficits / Details: generalized  3+/5 for hip flexion, knee ext, and ankle DF/PF. RLE Sensation: WNL RLE Coordination: decreased gross motor LLE Deficits / Details: generalized 3+/5 for hip flexion, knee ext, and ankle DF/PF. LLE Sensation: WNL LLE Coordination: decreased gross motor   Cervical / Trunk Assessment Cervical / Trunk Assessment: Kyphotic   Communication Communication Communication: Expressive difficulties   Cognition Arousal/Alertness: Awake/alert Behavior During Therapy: Flat affect (Occasional smiles.) Overall Cognitive Status: Impaired/Different from baseline Area of Impairment: Following commands, Safety/judgement, Awareness, Problem solving                       Following Commands: Follows one step commands with increased time, Follows one step commands consistently, Follows multi-step commands inconsistently   Awareness: Emergent Problem Solving: Slow processing, Requires verbal cues       General Comments       Exercises     Shoulder Instructions      Home Living Family/patient expects to be discharged to:: Private residence Living Arrangements: Alone Available Help at Discharge: Family;Personal care attendant Type of Home: House Home Access: Stairs to enter [employer] of Steps: 3 Entrance Stairs-Rails: Can reach both Home Layout: One level     Bathroom Shower/Tub: Occupational psychologist: Handicapped height Bathroom Accessibility: Yes How Accessible: Accessible via walker;Accessible via wheelchair Home Equipment: [place] (2 wheels);Wheelchair - manual;Grab bars - tub/shower;Grab bars - toilet;Shower seat;BSC/3in1;Hand held shower head   Additional Comments: Pt has a home aid to 7x/wk for ~4 hours per day. She has assist form her daugthers as well, one of whom is an OT. Someone is with her from ~10am-6pm and then 6pm-9pm. Platform RW since wrist fracture. [name] daughter reported that family is planning to increase CG hours to 10AM-6:00PM  Prior Functioning/Environment Prior Level of Function : Needs assist       Physical Assist : Mobility (physical);ADLs (physical)   ADLs (physical): IADLs;Bathing Mobility Comments: pt using RW with platform on Rt side for recent Rt wrist fracture. Pt should be NWB on Rt wrist. ADLs Comments: pt is able to dress self and has an aide for ~4 hours per day and family there at other times. Pt typically gets herself up in the morning, gets dressed, ambulates to living room or kitchen from bedroom. Waits for aid or family to arrive around 10am. does not eat a whol lot and doesn't fix a breakfast for herself.  Family reports plan to increase PCA hours from 10AM-6PM        OT Problem List: Decreased strength;Decreased coordination;Decreased cognition;Decreased activity tolerance;Decreased safety awareness;Impaired balance (sitting and/or standing);Decreased knowledge of use of DME or AE;Decreased knowledge of precautions;Impaired UE functional use      OT Treatment/Interventions: Self-care/ADL training;Therapeutic exercise;Therapeutic activities;Cognitive remediation/compensation;Neuromuscular education;DME and/or AE instruction;Patient/family education;Balance training    OT Goals(Current goals can be found in the care plan section) Acute Rehab OT Goals Patient Stated Goal: "Everything". Pt then clarified "Stronger" OT Goal Formulation: With patient/family Time For Goal Achievement: 10/16/22 Potential to Achieve Goals: Good ADL Goals Pt Will Perform Grooming: standing;with supervision Pt Will Perform Lower Body Bathing: with supervision;sitting/lateral leans;sit to/from stand;with adaptive equipment Pt Will Perform Lower Body Dressing: with set-up;sitting/lateral leans;sit to/from stand Pt Will Transfer to Toilet: with modified independence;ambulating Pt Will Perform Toileting - Clothing Manipulation and hygiene: with modified independence;sit to/from stand;sitting/lateral leans Pt Will  Perform Tub/Shower Transfer: with min guard assist;Shower transfer  OT Frequency: Min 3X/week    Co-evaluation              AM-PAC OT "6 Clicks" Daily Activity     Outcome Measure Help from another person eating meals?: A Little Help from another person taking care of personal grooming?: A Little Help from another person toileting, which includes using toliet, bedpan, or urinal?: A Little Help from another person bathing (including washing, rinsing, drying)?: A Little Help from another person to put on and taking off regular upper body clothing?: A Little Help from another person to put on and taking off regular lower body clothing?: A Little 6 Click Score: 18   End of Session Equipment Utilized During Treatment: Gait belt;Rolling walker (2 wheels) Nurse Communication: Mobility status  Activity Tolerance: Patient tolerated treatment well Patient left: in bed;with call bell/phone within reach;with bed alarm set;with family/visitor present  OT Visit Diagnosis: Unsteadiness on feet (R26.81);Cognitive communication deficit (R41.841);History of falling (Z91.81);Muscle weakness (generalized) (M62.81) Symptoms and signs involving cognitive functions: Cerebral infarction                Time: 3903-0092 OT Time Calculation (min): 42 min Charges:  OT General Charges $OT Visit: 1 Visit OT Evaluation $OT Eval Moderate Complexity: 1 Mod OT Treatments $Self Care/Home Management : 8-22 mins $Therapeutic Activity: 8-22 mins  Anderson Malta, OT Acute Rehab Services Office: [phone] 10/02/2022  Julien Girt 10/02/2022, 1:00 PM

## 2022-10-02 NOTE — Progress Notes (Signed)
PROGRESS NOTE    Barbara Krause  PYK:[id number] DOB: Apr 12, 1941 DOA: 10/01/2022 PCP: Jinny Sanders, MD   Brief Narrative:  Barbara Krause is a 81 y.o. female with medical history significant of breast cancer, Huntington's disease, depression who presented to ED with a few day history of trouble ambulating and dysarthria.  Daughter is present and states she fell 10 days ago. That was unusual for her. She started to drool and her eyes were closing and she was getting more lethargic. Since that time her balance has been more off and speech slurred more than usual per daughter. Admitted for stroke workup by [facility], neuro consulted.    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  Acute CVA (cerebrovascular accident) ([place]) -MRI confirms small punctate acute ischemic right cerebellar hemisphere stroke, likely unrelated to patient's symptoms as below -Neurology consulted, appreciate insight and recommendations -Continue aspirin, Plavix for 21 days then aspirin alone, atorvastatin 40 daily Lipid panel unremarkable, TSH within normal limits -PT OT ongoing; remains high for aspiration   Dysphagia Known history of dysphagia with risk for aspiration with her Huntington's disease Has had swallow studies, followed at [facility] Understands risks, still eats regular food cut up in small pieces  Speech evaluation as indicated, appears to be at baseline   HTN (hypertension) Somewhat elevated, continue home losartan -consider increasing dose in the next 24 hours if blood pressure not more well-controlled   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[place]) Followed by [facility] Continue austedo  Fall and aspiration precautions  ongoing PT/OT following, patient walks with a walker, new right arm cast makes this somewhat difficult   Tobacco use Quit 2 weeks ago Continue nicotine patch    High cholesterol Lipid panel unremarkable, continue statin   Depression, major, single episode, complete remission (HCC) Continue lexapro    Dementia ([place]) Appears mild Continue aricept daily  Delirium precautions    Right thyroid nodule Measuring upto 1.5cm Recommend outpatient thyroid US if patient wishes    Incidental left upper lobe groundglass opacity  -1.6 cm, radiology recommending 63-monthfollow-up to ensure clearance with noncontrast chest CT  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 Follow up outpatient   DVT prophylaxis: Lovenox Code Status: DNR Family Communication: Daughter updated at bedside  Status is: Inpatient  Dispo: The patient is from: Home              Anticipated d/c is to: Home              Anticipated d/c date is: 24 to 48 hours pending clinical course              Patient currently not medically stable for discharge  Consultants:  Neurology  Procedures:  None  Antimicrobials:  None indicated  Subjective: No acute issues or events overnight denies nausea vomiting diarrhea constipation headache fevers chills or chest pain  Objective: Vitals:   10/01/22 2216 10/01/22 2354 10/02/22 0214 10/02/22 0321  BP: (!) 170/92  (!) 141/91 (!) 142/78  Pulse: 65  71 60  Resp: '16  19 20  '$ Temp:  98.6 F (37 C) 98.8 F (37.1 C) 98.7 F (37.1 C)  TempSrc:  Oral Oral Axillary  SpO2:  98%  96% 93%  Weight:   60.8 kg   Height:   '5\' 6"'$  (1.676 m)     Intake/Output Summary (Last 24 hours) at 10/02/2022 0655 Last data filed at 10/02/2022 0200 Gross per 24 hour  Intake 90 ml  Output 100 ml  Net -10 ml   Filed Weights   10/02/22 0214  Weight: 60.8 kg    Examination:  General:  Pleasantly resting in bed, No acute distress. HEENT:  Sclerae nonicteric, noninjected.  Extraocular  movements intact bilaterally. Neck:  Without mass or deformity.  Trachea is midline. Lungs:  Clear to auscultate bilaterally without rhonchi, wheeze, or rales. Heart:  Regular rate and rhythm.  Without murmurs, rubs, or gallops. Abdomen:  Soft, nontender, nondistended.  Without guarding or rebound. Extremities: Right upper extremity in cast from elbow distally to hand  Data Reviewed: I have personally reviewed following labs and imaging studies  CBC: Recent Labs  Lab 10/01/22 1442  WBC 8.1  NEUTROABS 5.8  HGB 12.3  HCT 37.8  MCV 93.3  PLT 174   Basic Metabolic Panel: Recent Labs  Lab 10/01/22 1442 10/02/22 0105  NA 143 142  K 3.9 3.8  CL 104 108  CO2 27 26  GLUCOSE 82 104*  BUN 18 14  CREATININE 0.86 0.77  CALCIUM 9.7 8.7*   GFR: Estimated Creatinine Clearance: 54.3 mL/min (by C-G formula based on SCr of 0.77 mg/dL). Liver Function Tests: Recent Labs  Lab 10/01/22 1442  AST 22  ALT 18  ALKPHOS 101  BILITOT 0.7  PROT 5.9*  ALBUMIN 3.0*   No results for input(s): "LIPASE", "AMYLASE" in the last 168 hours. No results for input(s): "AMMONIA" in the last 168 hours. Coagulation Profile: Recent Labs  Lab 10/02/22 0105  INR 1.1   Cardiac Enzymes: No results for input(s): "CKTOTAL", "CKMB", "CKMBINDEX", "TROPONINI" in the last 168 hours. BNP (last 3 results) No results for input(s): "PROBNP" in the last 8760 hours. HbA1C: No results for input(s): "HGBA1C" in the last 72 hours. CBG: No results for input(s): "GLUCAP" in the last 168 hours. Lipid Profile: Recent Labs    10/02/22 0105  CHOL 133  HDL 59  LDLCALC 57  TRIG 85  CHOLHDL 2.3   Thyroid Function Tests: Recent Labs    10/01/22 1545  TSH 1.632   Radiology Studies: CT ANGIO HEAD NECK W WO CM  Result Date: 10/02/2022 CLINICAL DATA:  Acute neurologic deficit EXAM: CT ANGIOGRAPHY HEAD AND NECK TECHNIQUE: Multidetector CT imaging of the head and neck was performed using the standard protocol  during bolus administration of intravenous contrast. Multiplanar CT image reconstructions and MIPs were obtained to evaluate the vascular anatomy. Carotid stenosis measurements (when applicable) are obtained utilizing NASCET criteria, using the distal internal carotid diameter as the denominator. RADIATION DOSE REDUCTION: This exam was performed according to the departmental dose-optimization program which includes automated exposure control, adjustment of the mA and/or kV according to patient size and/or use of iterative reconstruction technique. CONTRAST:  74m OMNIPAQUE IOHEXOL 350 MG/ML SOLN COMPARISON:  None Available. FINDINGS: CT HEAD FINDINGS Brain: There is no mass, hemorrhage or extra-axial collection. There is generalized atrophy without lobar predilection. Old left cerebellar infarct There is hypoattenuation of the periventricular white matter, most commonly indicating chronic ischemic microangiopathy. Skull: The visualized skull base, calvarium and extracranial soft tissues are normal. Sinuses/Orbits: No fluid levels or advanced mucosal thickening of the visualized paranasal sinuses. No mastoid or middle ear effusion. The orbits are normal. CTA NECK FINDINGS  SKELETON: There is no bony spinal canal stenosis. No lytic or blastic lesion. OTHER NECK: Normal pharynx, larynx and major salivary glands. No cervical lymphadenopathy. Unremarkable thyroid gland. UPPER CHEST: Focal heterogeneous ground-glass opacity in the left upper lobe measuring 1.6 cm. AORTIC ARCH: There is calcific atherosclerosis of the aortic arch. There is no aneurysm, dissection or hemodynamically significant stenosis of the visualized portion of the aorta. Conventional 3 vessel aortic branching pattern. The visualized proximal subclavian arteries are widely patent. RIGHT CAROTID SYSTEM: Normal without aneurysm, dissection or stenosis. LEFT CAROTID SYSTEM: Normal without aneurysm, dissection or stenosis. VERTEBRAL ARTERIES: Left dominant  configuration. Severe narrowing of the right vertebral artery origin. Multifocal moderate narrowing of the right V2 and V3 segments. There is severe stenosis of the left vertebral artery origin, but the remainder is normal. CTA HEAD FINDINGS POSTERIOR CIRCULATION: --Vertebral arteries: Normal V4 segments. --Inferior cerebellar arteries: Normal. --Basilar artery: Normal. --Superior cerebellar arteries: Normal. --Posterior cerebral arteries (PCA): Normal. ANTERIOR CIRCULATION: --Intracranial internal carotid arteries: Normal. --Anterior cerebral arteries (ACA): Normal. Both A1 segments are present. Patent anterior communicating artery (a-comm). --Middle cerebral arteries (MCA): Normal. VENOUS SINUSES: As permitted by contrast timing, patent. ANATOMIC VARIANTS: None Review of the MIP images confirms the above findings. IMPRESSION: 1. No emergent large vessel occlusion or high-grade stenosis of the intracranial arteries. 2. 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 Multifocal moderate narrowing of the right V2 and V3 segments. 3. Focal heterogeneous ground-glass opacity in the left upper lobe measuring 1.6 cm. This could be infectious or neoplastic. Per Fleischner Society Guidelines, recommend a non-contrast Chest CT at 6 months to confirm persistence, then additional non-contrast Chest CTs every 2 years until 5 years. If nodule grows or develops solid component(s), consider resection. These guidelines do not apply to immunocompromised patients and patients with cancer. Follow up in patients with significant comorbidities as clinically warranted. For lung cancer screening, adhere to Lung-RADS guidelines. Reference: Radiology. 2017; 284(1):228-43. Aortic atherosclerosis (ICD10-I70.0). Electronically Signed   By: Ulyses Jarred M.D.   On: 10/02/2022 01:43   MR BRAIN WO CONTRAST  Result Date: 10/01/2022 CLINICAL DATA:  Slurred speech and difficulty walking EXAM: MRI HEAD WITHOUT CONTRAST TECHNIQUE:  Multiplanar, multiecho pulse sequences of the brain and surrounding structures were obtained without intravenous contrast. COMPARISON:  None Available. FINDINGS: Brain: Punctate focus of acute ischemia in the right cerebellar hemisphere. No other acute infarct. No acute or chronic hemorrhage. There is multifocal hyperintense T2-weighted signal within the white matter. Generalized volume loss. Old left cerebellar infarct. The midline structures are normal. Vascular: Major flow voids are preserved. Skull and upper cervical spine: Normal calvarium and skull base. Visualized upper cervical spine and soft tissues are normal. Sinuses/Orbits:No paranasal sinus fluid levels or advanced mucosal thickening. No mastoid or middle ear effusion. Normal orbits. IMPRESSION: 1. Punctate focus of acute ischemia in the right cerebellar hemisphere. No hemorrhage or mass effect. 2. 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 Electronically Signed   By: Ulyses Jarred M.D.   On: 10/01/2022 21:28   CT Cervical Spine Wo Contrast  Result Date: 10/01/2022 CLINICAL DATA:  Trauma EXAM: CT CERVICAL SPINE WITHOUT CONTRAST TECHNIQUE: Multidetector CT imaging of the cervical spine was performed without intravenous contrast. Multiplanar CT image reconstructions were also generated. RADIATION DOSE REDUCTION: This exam was performed according to the departmental dose-optimization program which includes automated exposure control, adjustment of the mA and/or kV according to patient size and/or use of iterative reconstruction technique. COMPARISON:  CT  C Spine 06/15/20 FINDINGS: Alignment: Normal. Skull base and vertebrae: No acute fracture. No primary bone lesion or focal pathologic process. Likely chronic superior endplate height loss at T3. Moderate degenerative changes at C4-C5. Soft tissues and spinal canal: No prevertebral fluid or swelling. No visible canal hematoma. Disc levels: Moderate spinal canal narrowing  at C4-C5 secondary to a disc bulge. There are severe degenerative changes at C1-C2 on the right. Upper chest: Negative. Other: Calcified thyroid nodule on the right measuring up to 8 mm. There is a separate more exophytic nodule along the inferior margin of the right thyroid lobe measuring up to 1.5 cm. IMPRESSION: 1. No acute cervical spine fracture or traumatic malalignment. 2. Moderate spinal canal narrowing at C4-C5 secondary to a disc bulge. 3. Severe degenerative changes at C1-C2 on the right. 4. Right thyroid nodule measuring up to 1.5 cm. Recommend further evaluation with a dedicated thyroid ultrasound, if not previously performed. Electronically Signed   By: Marin Roberts M.D.   On: 10/01/2022 15:39   CT HEAD WO CONTRAST (5MM)  Result Date: 10/01/2022 CLINICAL DATA:  Head trauma. EXAM: CT HEAD WITHOUT CONTRAST TECHNIQUE: Contiguous axial images were obtained from the base of the skull through the vertex without intravenous contrast. RADIATION DOSE REDUCTION: This exam was performed according to the departmental dose-optimization program which includes automated exposure control, adjustment of the mA and/or kV according to patient size and/or use of iterative reconstruction technique. COMPARISON:  CT head 06/15/2020.  MRI brain 06/20/2020. FINDINGS: Brain: No evidence of acute infarction, hemorrhage, hydrocephalus, extra-axial collection or mass lesion/mass effect. Again seen is moderate diffuse atrophy with compensatory dilatation of the ventricular system, unchanged. There is stable mild periventricular white matter hypodensity, likely chronic small vessel ischemic change. There is an old lacunar infarct in the right basal ganglia. There is also small chronic infarct in the left cerebellum, new from prior. Vascular: Atherosclerotic calcifications are present within the cavernous internal carotid arteries. Skull: Normal. Negative for fracture or focal lesion. Sinuses/Orbits: No acute finding. Other:  None. IMPRESSION: 1. No acute intracranial process. 2. Stable moderate diffuse atrophy. 3. Stable mild chronic small vessel ischemic change. 4. Small chronic infarct in the left cerebellum, new from prior. Electronically Signed   By: Ronney Asters M.D.   On: 10/01/2022 15:33   DG Chest 2 View  Result Date: 10/01/2022 CLINICAL DATA:  Fatigue, falls, dysarthria EXAM: CHEST - 2 VIEW COMPARISON:  06/30/2020 FINDINGS: Atherosclerotic calcification of the aortic arch. Old healed right rib fractures. Bilateral breast implants. Heart size within normal limits for AP projection. The lungs appear clear. Bony demineralization noted. Multiple remote thoracic compression fractures. No blunting of the costophrenic angles. IMPRESSION: 1. No acute cardiopulmonary disease is radiographically apparent. 2. Multiple remote thoracic compression fractures. 3. Atherosclerotic calcification of the aortic arch. Electronically Signed   By: Van Clines M.D.   On: 10/01/2022 15:15    Scheduled Meds:  [START ON 10/03/2022]  stroke: early stages of recovery book   Does not apply Once   aspirin EC  81 mg Oral Daily   atorvastatin  10 mg Oral Daily   Deutetrabenazine  2 tablet Oral BID   donepezil  5 mg Oral Daily   enoxaparin (LOVENOX) injection  40 mg Subcutaneous Q24H   escitalopram  20 mg Oral Daily   losartan  50 mg Oral Daily   nicotine  21 mg Transdermal Daily   Continuous Infusions:   LOS: 0 days   Time spent: 25mn  WLuanna Cole  Avon Gully, DO [facility]  If 7PM-7AM, please contact night-coverage [email or web address]  10/02/2022, 6:55 AM

## 2022-10-02 NOTE — Progress Notes (Signed)
TR[facility] night cross cover note:   I was notified by patient's RN that CCMD had noted an 11 beat run of non-sustained atrial tachycardia around 0300, with HR max during that run noted to be 138. Spontaneously converted back to SR, with HR's currently noted to be in the 50's , similar to HR's in the 50's to 70's noted yesterday. SBP's in the 140's mmHg. Patient asleep at the time of the above.    Babs Bertin, DO Hospitalist

## 2022-10-02 NOTE — Evaluation (Signed)
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 10/02/2022   CVA (cerebral vascular accident) ([place]) 10/02/2022   Acute CVA (cerebrovascular accident) ([place]) 10/01/2022   Coronary atherosclerosis 08/03/2022   Compression fracture of thoracolumbar vertebra with routine healing 08/03/2022   DNR (do not resuscitate) 08/03/2022   At risk for aspiration 08/03/2022   Dysphagia 08/03/2022   High cholesterol 08/03/2022   Dementia ([place]) 08/03/2022   Recurrent falls 11/26/2020   Moderate protein-calorie malnutrition ([place]) 06/30/2020   History of intracranial hemorrhage 06/15/2020   Aortic atherosclerosis ([place]) 08/30/2019   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[place]) 08/30/2019   Microscopic hematuria 04/02/2019   Osteoporosis 04/02/2019   Vitamin B12 deficiency 04/02/2019   Thoracic aortic aneurysm (TAA) ([place]) 04/02/2019   HTN (hypertension) 03/05/2019   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[place]) 03/05/2019   Tobacco use 03/05/2019   Depression, major, single episode, complete remission ([place]) 03/05/2019   Hx of breast cancer 03/05/2019   S/P bilateral mastectomy 03/05/2019   Past Medical History:  Past Medical History:  Diagnosis Date   Breast cancer ([place]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   Depression    History of chicken pox    History of colon polyps    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)    Kidney stone    Osteoporosis    Past Surgical History:  Past Surgical History:  Procedure Laterality Date   CESAREAN SECTION     1977 and [place] Bilateral 2009   HPI:  Patient is a 81  y.o. female with PMH: Huntington's disease, h/o breast cancer s/p total mastectomy, h/o left cerebellar CVA, osteoporosis,  depression, dysphagia, dysarthria. She quit smoking two weeks ago after approximately 60 year h/o smoking 1ppd. She presented to the hospital on 10/01/2022 with assessment of slurred speech that had begun six days earlier; she had a fall on Sunday (11/26) due to a syncopal spell during which she struck her head. Immediately following this fall, she had slurred speech, drooling from left side of her mouth and drowsiness which progressed as the week went on. MRI in ED revealed diffuse cerebral atrophy and an acute punctate ischemic infarction in the right cerebellar hemisphere.   Assessment / Plan / Recommendation Clinical Impression  Patient presents with moderate dysarthria and mild-moderate cognitive-linguistic impairment. She does have baseline deficits in all three of these areas, but patient and family concerned that her speech intelligibility has declined and her swallow function has declined as well. Changes that patient and family have noticed include new onset of drooling from mouth, increased incidents of coughing and increased fatigue/lethargy. Patient completed OP SLP services which had focused on dysarthria and dysphagia, on 07/27/2022. She continues to work on her "sentences" that she recites and per her daughter, she is very diligent with working on her therapy "homework". SLP observed saliva spilling out of anterior portion of mouth when patient's head on left while sidelying and small amount of saliva pooling in left lateral sulci when she had repositioned head to midline. She was able to produce very clear and audible short phrases to respond to questions and comment, but often she would just let her daughter respond. (this is not terribly new per daughter's report) Patient's voice was monotone and  affect was fairly flat but she did share that "its more difficult" (to speak clearly). Patient has an aide who helps her some during the day and does work with her on her exercises. OT and PT  recommending Acute Inpatient Rehab and she would benefit from SLP services at this level of care as well. SLP will follow while in acute level of care for continued education, assessment, and assistance in reviewing and likely adjusting patient's goals of care regarding speech and swallow function.    SLP Assessment  SLP Recommendation/Assessment: Patient needs continued Speech Lanaguage Pathology Services    Recommendations for follow up therapy are one component of a multi-disciplinary discharge planning process, led by the attending physician.  Recommendations may be updated based on patient status, additional functional criteria and insurance authorization.    Follow Up Recommendations  Acute inpatient rehab (3hours/day)    Assistance Recommended at Discharge  Frequent or constant Supervision/Assistance  Functional Status Assessment Patient has had a recent decline in their functional status and demonstrates the ability to make significant improvements in function in a reasonable and predictable amount of time.  Frequency and Duration min 1 x/week  2 weeks      SLP Evaluation Cognition  Overall Cognitive Status: Difficult to assess Arousal/Alertness: Awake/alert Orientation Level: Oriented to person;Oriented to place;Oriented to time Memory: Impaired Memory Impairment: Decreased recall of new information;Retrieval deficit Awareness: Impaired Awareness Impairment: Emergent impairment Problem Solving: Impaired Problem Solving Impairment: Verbal basic;Verbal complex       Comprehension  Auditory Comprehension Overall Auditory Comprehension: Appears within functional limits for tasks assessed    Expression Expression Primary Mode of Expression: Verbal Verbal Expression Overall Verbal Expression: Impaired at baseline Pragmatics: Impairment Impairments: Abnormal affect;Monotone Interfering Components: Premorbid deficit Written Expression Dominant Hand: Right   Oral / Motor   Oral Motor/Sensory Function Overall Oral Motor/Sensory Function: Moderate impairment Facial ROM: Reduced right Facial Strength: Reduced right;Reduced left Lingual ROM: Reduced right;Reduced left Lingual Symmetry: Within Functional Limits Lingual Strength: Reduced Velum: Within Functional Limits Mandible: Impaired Motor Speech Overall Motor Speech: Impaired at baseline Respiration: Within functional limits Phonation: Low vocal intensity Resonance: Within functional limits Articulation: Impaired Level of Impairment: Phrase Intelligibility: Intelligibility reduced Word: 75-100% accurate Phrase: 50-74% accurate Sentence: Not tested Conversation: Not tested Motor Speech Errors: Not applicable Interfering Components: Premorbid status Effective Techniques: Slow rate;Pacing            Sonia Baller, MA, CCC-SLP Speech Therapy

## 2022-10-02 NOTE — Evaluation (Signed)
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 female with PMH: Huntington's disease, h/o breast cancer s/p total mastectomy, h/o left cerebellar CVA, osteoporosis, depression, dysphagia, dysarthria. She quit smoking two weeks ago after approximately 60 year h/o smoking 1ppd. She presented to the hospital on 10/01/2022 with assessment of slurred speech that had begun six days earlier; she had a fall on Sunday (11/26) due to a syncopal spell during which she struck her head. Immediately following this fall, she had slurred speech, drooling from left side of her mouth and drowsiness which progressed as the week went on. MRI in ED revealed diffuse cerebral atrophy and an acute punctate ischemic infarction in the right cerebellar hemisphere.    Assessment / Plan / Recommendation  Clinical Impression  Patient presents with clinical s/s of dysphagia as per this bedside swallow evaluation. SLP reviewed patient's past Modified Barium swallow studies and in 2020 she exhibited "slight-mild" oral phase dysphagia and in August of 2023 she exhibited "moderate to severe oropharyngeal dysphagia with aspiration of thin, nectar and honey thick liquids". In both the 2020 and 2023 MBS, an esophageal componenent was suspected. During today's bedside swallow evaluation, SLP  discussed with both how her swallow has declined in the past three years and both seemed understanding that her swallow function will only get worse and not better. Her daughter did say that she has noticed less frequency of coughing in the past few weeks. SLP observed patient with cup and then straw sips of thin liquids (water). She reported that it was easier to drink with a straw but SLP did not observe any difference when she did so. With each swallow, she would hold liquid in mouth and swallow a small amount (three, sometimes up to four swallows) until liquid was gone from  her oral cavity. She exhibited instances of immediate explosive coughing and it was difficult to determine if any significant difference between larger and smaller sip sizes. SLP educated patient and daughter on importance of oral care, recommending to do so after each meal and before bed. In addition, SLP recommending that drink water throughout the day for moisture/hydration and during meals, to focus on things that have nutritional value. Her daughter reported that she has never been a big eater and so she and other family members have been encouraging patient to [place]. SLP will follow briefly for assistance with education of patient and family regarding mitigating risk of known severe dysphagia. SLP Visit Diagnosis: Dysphagia, unspecified (R13.10)    Aspiration Risk  Severe aspiration risk;Risk for inadequate nutrition/hydration    Diet Recommendation Regular;Dysphagia 3 (Mech soft);Thin liquid   Liquid Administration via: Cup;Straw Medication Administration: Whole meds with liquid Supervision: Patient able to self feed;Full supervision/cueing for compensatory strategies Compensations: Slow  rate;Small sips/bites Postural Changes: Seated upright at 90 degrees    Other  Recommendations Oral Care Recommendations: Oral care BID    Recommendations for follow up therapy are one component of a multi-disciplinary discharge  planning process, led by the attending physician.  Recommendations may be updated based on patient status, additional functional criteria and insurance authorization.  Follow up Recommendations Acute inpatient rehab (3hours/day)      Assistance Recommended at Discharge Frequent or constant Supervision/Assistance  Functional Status Assessment Patient has had a recent decline in their functional status and demonstrates the ability to make significant improvements in function in a reasonable and predictable amount of time.  Frequency and Duration min 1 x/week  1 week       Prognosis Prognosis for Safe Diet Advancement: Guarded Barriers to Reach Goals: Time post onset;Severity of deficits Barriers/Prognosis Comment: h/o mod-severe dysphagia whis has been progressive      Swallow Study   General Date of Onset: 10/01/22 HPI: Patient is a 81  y.o. female with PMH: Huntington's disease, h/o breast cancer s/p total mastectomy, h/o left cerebellar CVA, osteoporosis, depression, dysphagia, dysarthria. She quit smoking two weeks ago after approximately 60 year h/o smoking 1ppd. She presented to the hospital on 10/01/2022 with assessment of slurred speech that had begun six days earlier; she had a fall on Sunday (11/26) due to a syncopal spell during which she struck her head. Immediately following this fall, she had slurred speech, drooling from left side of her mouth and drowsiness which progressed as the week went on. MRI in ED revealed diffuse cerebral atrophy and an acute punctate ischemic infarction in the right cerebellar hemisphere. Type of Study: Bedside Swallow Evaluation Previous Swallow Assessment: 06/28/2022 Diet Prior to this Study: Regular;Thin liquids Temperature Spikes Noted: No Respiratory Status: Room air History of Recent Intubation: No Behavior/Cognition: Alert;Cooperative;Pleasant mood Oral Cavity Assessment: Within Functional Limits Oral Care Completed by SLP: Yes Oral Cavity -  Dentition: Adequate natural dentition Vision: Functional for self-feeding Self-Feeding Abilities: Able to feed self;Needs set up Patient Positioning: Upright in bed Baseline Vocal Quality: Normal;Low vocal intensity Volitional Cough: Weak Volitional Swallow: Able to elicit    Oral/Motor/Sensory Function Overall Oral Motor/Sensory Function: Mild impairment Facial ROM: Reduced right Facial Strength: Reduced right;Reduced left Lingual ROM: Reduced right;Reduced left Lingual Symmetry: Within Functional Limits Lingual Strength: Reduced Velum: Within Functional Limits Mandible: Impaired   Ice Chips     Thin Liquid Thin Liquid: Impaired Presentation: Straw;Cup;Self Fed Oral Phase Functional Implications: Oral holding;Prolonged oral transit Pharyngeal  Phase Impairments: Suspected delayed Swallow;Cough - Immediate;Decreased hyoid-laryngeal movement    Nectar Thick     Honey Thick     Puree Puree: Not tested   Solid     Solid: Not tested      Sonia Baller, MA, CCC-SLP Speech Therapy

## 2022-10-02 NOTE — Assessment & Plan Note (Signed)
Appears mild Continue aricept daily  Delirium precautions

## 2022-10-02 NOTE — Progress Notes (Signed)
Inpatient Rehab Admissions Coordinator Note:   Per therapy patient was screened for CIR candidacy by Kalista Laguardia Danford Bad, CCC-SLP. At this time, pt appears to be a potential candidate for CIR. I will palce an order for rehab consult for full assessment, per our protocol.  Please contact me any with questions.Gayland Curry, [place], [place] Admissions Coordinator [phone] 10/02/22 5:44 PM

## 2022-10-02 NOTE — Progress Notes (Signed)
CCMD called pt had 11 beats of atrial tachycardia non sustained HR was 138. Currently, she is sinus bradycardia at 55 bpm on the monitor. Pt is asleep. On call provider made aware. Will continue to monitor.

## 2022-10-02 NOTE — Consult Note (Signed)
NEURO HOSPITALIST CONSULT NOTE   Requestig physician: Dr. Avon Gully  Reason for Consult: Punctate acute right cerebellar ischemic infarction on MRI  History obtained from:  Patient, Daughter and Chart     HPI:                                                                                                                                          Barbara Krause is an 81 y.o. female with a PMHx of Huntington's disease, breast cancer and osteoporosis, who presented to the ED on Friday afternoon for assessment of slurred speech that had begun about 6 days earlier. She had had a fall on "Sunday due to a syncopal spell, striking her head. She then began to experience slurred speech, drooling from the left side of her mouth and drowsiness, which progressed as the week went on. MRI brain in the ED revealed diffuse cerebral atrophy and an acute punctate ischemic infarction in the right cerebellar hemisphere. Her parents did not have Huntington's disease, but her sister does have this diagnosis as well.   Past Medical History:  Diagnosis Date   Breast cancer (HCC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   Depression    History of chicken pox    History of colon polyps    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HCC)    Kidney stone    Osteoporosis     Past Surgical History:  Procedure Laterality Date   CESAREAN SECTION     19"$ 79 and 1979   CHOLECYSTECTOMY     TOTAL MASTECTOMY Bilateral 2009    Family History  Problem Relation Age of Onset   Colon cancer Mother    Stroke Father    Heart disease Father    Ovarian cancer Sister    Healthy Daughter    Stomach cancer Maternal Grandmother    Diabetes Maternal Grandfather    Colon cancer Paternal Grandmother    Diabetes Paternal Grandfather    Healthy Daughter              Social History:  reports that she has been smoking cigarettes. She has a 37.50 pack-year smoking history. She has never used smokeless tobacco. She reports current alcohol use. She reports that  she does not use drugs.  Allergies  Allergen Reactions   Penicillin G Hives   Bactrim [Sulfamethoxazole-Trimethoprim] Rash    MEDICATIONS:  Prior to Admission:  Medications Prior to Admission  Medication Sig Dispense Refill Last Dose   alendronate (FOSAMAX) 70 MG tablet Take 1 tablet (70 mg total) by mouth every 7 (seven) days. Take with a full glass of water on an empty stomach. 12 tablet 3    atorvastatin (LIPITOR) 10 MG tablet TAKE 1 TABLET BY MOUTH EVERY DAY 90 tablet 3    AUSTEDO 12 MG TABS Take 2 tablets by mouth 2 (two) times daily.      donepezil (ARICEPT) 5 MG tablet TAKE 1 TABLET (5 MG TOTAL) BY MOUTH DAILY. 90 tablet 1    escitalopram (LEXAPRO) 20 MG tablet TAKE 1 TABLET BY MOUTH EVERY DAY 90 tablet 3    losartan (COZAAR) 25 MG tablet Take 2 tablets (50 mg total) by mouth daily. 180 tablet 1    scopolamine (TRANSDERM-SCOP) 1 MG/3DAYS Place 1 patch (1.5 mg total) onto the skin every 3 (three) days. 10 patch 11    Scheduled:  [START ON 10/03/2022]  stroke: early stages of recovery book   Does not apply Once   aspirin EC  81 mg Oral Daily   atorvastatin  10 mg Oral Daily   Deutetrabenazine  2 tablet Oral BID   donepezil  5 mg Oral Daily   enoxaparin (LOVENOX) injection  40 mg Subcutaneous Q24H   escitalopram  20 mg Oral Daily   losartan  50 mg Oral Daily   nicotine  21 mg Transdermal Daily     ROS:                                                                                                                                       As per HPI. Does not endorse any additional symptoms except for worsened gait unsteadiness and stable chorea, which is chronic secondary to her Huntington's disease.   Blood pressure 135/76, pulse 60, temperature 99 F (37.2 C), temperature source Oral, resp. rate 18, height '5\' 6"'$  (1.676 m), weight 60.8 kg, SpO2 94 %.   General  Examination:                                                                                                       Physical Exam  HEENT-  [place]/AT    Lungs- Respirations unlabored Extremities- No edema  Neurological Examination Mental Status: Awake and alert. Speech is fluent with intact naming and comprehension. Subtle dysarthria. Partially oriented. Pleasant and cooperative.  Cranial Nerves: II: Temporal visual fields intact with  no extinction to DSS.  III,IV, VI: No ptosis. EOMI with subtle saccadic quality to visual pursuits.  V: Temp sensation equal bilaterally  VII: Smile symmetric. Frequent facial dyskinesias are noted, predominantly affecting the mouth VIII: Hearing intact to voice IX,X: No hoarseness XI: Symmetric shoulder shrug XII: Midline tongue extension Motor: BUE 5/5 proximally and distally BLE 5/5 proximally and distally  No pronator drift.  Some fidgeting movements of her limbs appear most consistent with chorea.  Sensory: Temp and light touch intact throughout, bilaterally. No extinction to DSS.  Deep Tendon Reflexes: 2+ and symmetric throughout Cerebellar: Mild ataxia with FNF bilaterally  Gait: Deferred   Lab Results: Basic Metabolic Panel: Recent Labs  Lab 10/01/22 1442 10/02/22 0105  NA 143 142  K 3.9 3.8  CL 104 108  CO2 27 26  GLUCOSE 82 104*  BUN 18 14  CREATININE 0.86 0.77  CALCIUM 9.7 8.7*    CBC: Recent Labs  Lab 10/01/22 1442  WBC 8.1  NEUTROABS 5.8  HGB 12.3  HCT 37.8  MCV 93.3  PLT 202    Cardiac Enzymes: No results for input(s): "CKTOTAL", "CKMB", "CKMBINDEX", "TROPONINI" in the last 168 hours.  Lipid Panel: Recent Labs  Lab 10/02/22 0105  CHOL 133  TRIG 85  HDL 59  CHOLHDL 2.3  VLDL 17  LDLCALC 57    Imaging: CT ANGIO HEAD NECK W WO CM  Result Date: 10/02/2022 CLINICAL DATA:  Acute neurologic deficit EXAM: CT ANGIOGRAPHY HEAD AND NECK TECHNIQUE: Multidetector CT imaging of the head and neck was performed using the  standard protocol during bolus administration of intravenous contrast. Multiplanar CT image reconstructions and MIPs were obtained to evaluate the vascular anatomy. Carotid stenosis measurements (when applicable) are obtained utilizing NASCET criteria, using the distal internal carotid diameter as the denominator. RADIATION DOSE REDUCTION: This exam was performed according to the departmental dose-optimization program which includes automated exposure control, adjustment of the mA and/or kV according to patient size and/or use of iterative reconstruction technique. CONTRAST:  18m OMNIPAQUE IOHEXOL 350 MG/ML SOLN COMPARISON:  None Available. FINDINGS: CT HEAD FINDINGS Brain: There is no mass, hemorrhage or extra-axial collection. There is generalized atrophy without lobar predilection. Old left cerebellar infarct There is hypoattenuation of the periventricular white matter, most commonly indicating chronic ischemic microangiopathy. Skull: The visualized skull base, calvarium and extracranial soft tissues are normal. Sinuses/Orbits: No fluid levels or advanced mucosal thickening of the visualized paranasal sinuses. No mastoid or middle ear effusion. The orbits are normal. CTA NECK FINDINGS SKELETON: There is no bony spinal canal stenosis. No lytic or blastic lesion. OTHER NECK: Normal pharynx, larynx and major salivary glands. No cervical lymphadenopathy. Unremarkable thyroid gland. UPPER CHEST: Focal heterogeneous ground-glass opacity in the left upper lobe measuring 1.6 cm. AORTIC ARCH: There is calcific atherosclerosis of the aortic arch. There is no aneurysm, dissection or hemodynamically significant stenosis of the visualized portion of the aorta. Conventional 3 vessel aortic branching pattern. The visualized proximal subclavian arteries are widely patent. RIGHT CAROTID SYSTEM: Normal without aneurysm, dissection or stenosis. LEFT CAROTID SYSTEM: Normal without aneurysm, dissection or stenosis. VERTEBRAL  ARTERIES: Left dominant configuration. Severe narrowing of the right vertebral artery origin. Multifocal moderate narrowing of the right V2 and V3 segments. There is severe stenosis of the left vertebral artery origin, but the remainder is normal. CTA HEAD FINDINGS POSTERIOR CIRCULATION: --Vertebral arteries: Normal V4 segments. --Inferior cerebellar arteries: Normal. --Basilar artery: Normal. --Superior cerebellar arteries: Normal. --Posterior cerebral arteries (PCA): Normal. ANTERIOR CIRCULATION: --Intracranial  internal carotid arteries: Normal. --Anterior cerebral arteries (ACA): Normal. Both A1 segments are present. Patent anterior communicating artery (a-comm). --Middle cerebral arteries (MCA): Normal. VENOUS SINUSES: As permitted by contrast timing, patent. ANATOMIC VARIANTS: None Review of the MIP images confirms the above findings. IMPRESSION: 1. No emergent large vessel occlusion or high-grade stenosis of the intracranial arteries. 2. 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 Multifocal moderate narrowing of the right V2 and V3 segments. 3. Focal heterogeneous ground-glass opacity in the left upper lobe measuring 1.6 cm. This could be infectious or neoplastic. Per Fleischner Society Guidelines, recommend a non-contrast Chest CT at 6 months to confirm persistence, then additional non-contrast Chest CTs every 2 years until 5 years. If nodule grows or develops solid component(s), consider resection. These guidelines do not apply to immunocompromised patients and patients with cancer. Follow up in patients with significant comorbidities as clinically warranted. For lung cancer screening, adhere to Lung-RADS guidelines. Reference: Radiology. 2017; 284(1):228-43. Aortic atherosclerosis (ICD10-I70.0). Electronically Signed   By: Ulyses Jarred M.D.   On: 10/02/2022 01:43   MR BRAIN WO CONTRAST  Result Date: 10/01/2022 CLINICAL DATA:  Slurred speech and difficulty walking EXAM: MRI HEAD WITHOUT  CONTRAST TECHNIQUE: Multiplanar, multiecho pulse sequences of the brain and surrounding structures were obtained without intravenous contrast. COMPARISON:  None Available. FINDINGS: Brain: Punctate focus of acute ischemia in the right cerebellar hemisphere. No other acute infarct. No acute or chronic hemorrhage. There is multifocal hyperintense T2-weighted signal within the white matter. Generalized volume loss. Old left cerebellar infarct. The midline structures are normal. Vascular: Major flow voids are preserved. Skull and upper cervical spine: Normal calvarium and skull base. Visualized upper cervical spine and soft tissues are normal. Sinuses/Orbits:No paranasal sinus fluid levels or advanced mucosal thickening. No mastoid or middle ear effusion. Normal orbits. IMPRESSION: 1. Punctate focus of acute ischemia in the right cerebellar hemisphere. No hemorrhage or mass effect. 2. 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 Electronically Signed   By: Ulyses Jarred M.D.   On: 10/01/2022 21:28   CT Cervical Spine Wo Contrast  Result Date: 10/01/2022 CLINICAL DATA:  Trauma EXAM: CT CERVICAL SPINE WITHOUT CONTRAST TECHNIQUE: Multidetector CT imaging of the cervical spine was performed without intravenous contrast. Multiplanar CT image reconstructions were also generated. RADIATION DOSE REDUCTION: This exam was performed according to the departmental dose-optimization program which includes automated exposure control, adjustment of the mA and/or kV according to patient size and/or use of iterative reconstruction technique. COMPARISON:  CT C Spine 06/15/20 FINDINGS: Alignment: Normal. Skull base and vertebrae: No acute fracture. No primary bone lesion or focal pathologic process. Likely chronic superior endplate height loss at T3. Moderate degenerative changes at C4-C5. Soft tissues and spinal canal: No prevertebral fluid or swelling. No visible canal hematoma. Disc levels: Moderate  spinal canal narrowing at C4-C5 secondary to a disc bulge. There are severe degenerative changes at C1-C2 on the right. Upper chest: Negative. Other: Calcified thyroid nodule on the right measuring up to 8 mm. There is a separate more exophytic nodule along the inferior margin of the right thyroid lobe measuring up to 1.5 cm. IMPRESSION: 1. No acute cervical spine fracture or traumatic malalignment. 2. Moderate spinal canal narrowing at C4-C5 secondary to a disc bulge. 3. Severe degenerative changes at C1-C2 on the right. 4. Right thyroid nodule measuring up to 1.5 cm. Recommend further evaluation with a dedicated thyroid ultrasound, if not previously performed. Electronically Signed   By: Marin Roberts  M.D.   On: 10/01/2022 15:39   CT HEAD WO CONTRAST (5MM)  Result Date: 10/01/2022 CLINICAL DATA:  Head trauma. EXAM: CT HEAD WITHOUT CONTRAST TECHNIQUE: Contiguous axial images were obtained from the base of the skull through the vertex without intravenous contrast. RADIATION DOSE REDUCTION: This exam was performed according to the departmental dose-optimization program which includes automated exposure control, adjustment of the mA and/or kV according to patient size and/or use of iterative reconstruction technique. COMPARISON:  CT head 06/15/2020.  MRI brain 06/20/2020. FINDINGS: Brain: No evidence of acute infarction, hemorrhage, hydrocephalus, extra-axial collection or mass lesion/mass effect. Again seen is moderate diffuse atrophy with compensatory dilatation of the ventricular system, unchanged. There is stable mild periventricular white matter hypodensity, likely chronic small vessel ischemic change. There is an old lacunar infarct in the right basal ganglia. There is also small chronic infarct in the left cerebellum, new from prior. Vascular: Atherosclerotic calcifications are present within the cavernous internal carotid arteries. Skull: Normal. Negative for fracture or focal lesion. Sinuses/Orbits: No  acute finding. Other: None. IMPRESSION: 1. No acute intracranial process. 2. Stable moderate diffuse atrophy. 3. Stable mild chronic small vessel ischemic change. 4. Small chronic infarct in the left cerebellum, new from prior. Electronically Signed   By: Ronney Asters M.D.   On: 10/01/2022 15:33   DG Chest 2 View  Result Date: 10/01/2022 CLINICAL DATA:  Fatigue, falls, dysarthria EXAM: CHEST - 2 VIEW COMPARISON:  06/30/2020 FINDINGS: Atherosclerotic calcification of the aortic arch. Old healed right rib fractures. Bilateral breast implants. Heart size within normal limits for AP projection. The lungs appear clear. Bony demineralization noted. Multiple remote thoracic compression fractures. No blunting of the costophrenic angles. IMPRESSION: 1. No acute cardiopulmonary disease is radiographically apparent. 2. Multiple remote thoracic compression fractures. 3. Atherosclerotic calcification of the aortic arch. Electronically Signed   By: Van Clines M.D.   On: 10/01/2022 15:15     Assessment: 81 y.o. female with a PMHx of Huntington's disease, breast cancer and osteoporosis, who presented to the ED on Friday afternoon for assessment of slurred speech that had begun about 6 days earlier. She had had a fall on Sunday due to a syncopal spell, striking her head. She then began to experience slurred speech, drooling from the left side of her mouth and drowsiness, which progressed as the week went on. MRI brain in the ED revealed diffuse cerebral atrophy and an acute punctate ischemic infarction in the right cerebellar hemisphere. Her parents did not have Huntington's disease, but her sister does have this diagnosis as well.  - Exam reveals findings consistent with her known diagnosis of Huntington's disease.  - MRI brain: Punctate focus of acute ischemia in the right cerebellar hemisphere. No hemorrhage or mass effect. 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 Multifocal moderate narrowing of the right V2 and V3 segments.  - Also seen on CTA: Focal heterogeneous ground-glass opacity in the left upper lobe measuring 1.6 cm. This could be infectious or neoplastic.  - The punctate right cerebellar stroke does not account for her recent worsening, but her Huntington's disease does.  - Although the stroke is not likely to be associated with any disability, it is a risk factor for possible much larger stroke in the future, necessitating completion of full stroke work up. Based on the severe  stenoses at the origins of the vertebral arteries, atherosclerotic disease is felt to be the most likely etiology for her stroke.     Recommendations: - HgbA1c, fasting lipid panel - TTE - Cardiac telemetry - PT consult, OT consult, Speech consult - Increase atorvastatin to 40 mg po qd - Add Plavix to ASA for DAPT and continue for 21 days, then stop Plavix and continue ASA monotherapy indefinitely - BP management per standard protocol. Out of the permissive HTN time window.  - Frequent neuro checks - NPO until passes stroke swallow screen - Outpatient Neurology follow up for her Huntington's disease   Electronically signed: Dr. Kerney Elbe 10/02/2022, 8:34 AM

## 2022-10-02 NOTE — Assessment & Plan Note (Signed)
Followed by Rob Hickman Continue austedo  Fall and aspiration precautions  PT/OT

## 2022-10-02 NOTE — Assessment & Plan Note (Signed)
81 year old female with history of old left cerebellar infarct who presented with 10 day history of fall, balance issues, slurred and more dysarthric speech, increased confusion and other stroke symptoms found to have an acute right cerebellar infarct.  -place in observation on progressive for CVA work up  -Neurochecks per protocol -Neurology consulted -CTA neck/head pending -echo -lipid panel and A1C. LDL to goal in 12/2021, continue lipitor  -Continue daily aspirin 81 mg. Plavix per neurology  -continue home blood pressure medication. Symptom onset 10 days ago  -N.p.o. until bedside swallow screen -aspiration and fall precautions  -PT/ OT/ SLP consult

## 2022-10-02 NOTE — Assessment & Plan Note (Signed)
Quit 2 weeks ago Continue nicotine patch

## 2022-10-02 NOTE — Assessment & Plan Note (Addendum)
Known history of dysphagia with risk for aspiration with her HD Has had swallow studies, followed at [facility] Understands risks, still eats regular food cut up in small pieces  Swallow study and ST eval

## 2022-10-02 NOTE — Assessment & Plan Note (Signed)
LDL to goal in 12/2021 at 40  Continue statin

## 2022-10-02 NOTE — Assessment & Plan Note (Signed)
Measuring upto 1.5cm Recommend outpatient thyroid US if patient wishes

## 2022-10-02 NOTE — Assessment & Plan Note (Signed)
CVA symptoms started 10 days ago Continue with losartan '25mg'$  daily

## 2022-10-03 ENCOUNTER — Inpatient Hospital Stay (HOSPITAL_COMMUNITY): Payer: Medicare Other

## 2022-10-03 DIAGNOSIS — I6389 Other cerebral infarction: Secondary | ICD-10-CM | POA: Diagnosis not present

## 2022-10-03 LAB — ECHOCARDIOGRAM COMPLETE
Height: 66 in
S' Lateral: 2.6 cm
Weight: 2144.63 oz

## 2022-10-03 MED ORDER — ATORVASTATIN CALCIUM 10 MG PO TABS
30.0000 mg | ORAL_TABLET | Freq: Once | ORAL | Status: AC
  Administered 2022-10-03: 30 mg via ORAL
  Filled 2022-10-03: qty 3

## 2022-10-03 MED ORDER — CLOPIDOGREL BISULFATE 75 MG PO TABS
75.0000 mg | ORAL_TABLET | Freq: Every day | ORAL | Status: DC
  Administered 2022-10-03 – 2022-10-04 (×2): 75 mg via ORAL
  Filled 2022-10-03 (×2): qty 1

## 2022-10-03 MED ORDER — ATORVASTATIN CALCIUM 10 MG PO TABS: 30.0000 mg | ORAL_TABLET | Freq: Every day | ORAL | Status: DC

## 2022-10-03 MED ORDER — ATORVASTATIN CALCIUM 40 MG PO TABS
40.0000 mg | ORAL_TABLET | Freq: Every day | ORAL | Status: DC
  Administered 2022-10-04: 40 mg via ORAL
  Filled 2022-10-03: qty 1

## 2022-10-03 NOTE — Progress Notes (Signed)
PROGRESS NOTE    Barbara Krause  OYD:[id number] DOB: 06-14-1941 DOA: 10/01/2022 PCP: Jinny Sanders, MD   Brief Narrative:  Barbara Krause is a 81 y.o. female with medical history significant of breast cancer, Huntington's disease, depression who presented to ED with a few day history of trouble ambulating and dysarthria.  Daughter is present and states she fell 10 days ago. That was unusual for her. She started to drool and her eyes were closing and she was getting more lethargic. Since that time her balance has been more off and speech slurred more than usual per daughter. Admitted for stroke workup by [facility], neuro consulted.    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   CVA (cerebral vascular accident) ([place])  Acute CVA (cerebrovascular accident) ([place]) -MRI confirms small punctate acute ischemic right cerebellar hemisphere stroke, likely unrelated to patient's symptoms as below -Neurology consulted, appreciate insight and recommendations -Continue aspirin, Plavix for 21 days then aspirin alone, atorvastatin 40 daily Lipid panel unremarkable, TSH within normal limits -PT/OT ongoing; remains high for aspiration   Dysphagia Known history of dysphagia with risk for aspiration with her Huntington's disease Has had swallow studies, followed at [facility] Understands risks, still eats regular food cut up in small pieces  Speech evaluation as indicated, appears to be at baseline   HTN (hypertension) Somewhat elevated, continue home losartan -consider increasing dose in the next 24 hours if blood pressure not more well-controlled   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[place]) Followed by [facility] Continue  austedo  Fall and aspiration precautions ongoing PT/OT following, patient walks with a walker, new right arm cast makes this somewhat difficult   Tobacco use Quit 2 weeks ago Continue nicotine patch    High cholesterol Lipid panel unremarkable, continue statin   Depression, major, single episode, complete remission (HCC) Continue lexapro    Dementia ([place]) Appears mild Continue aricept daily  Delirium precautions    Right thyroid nodule Measuring upto 1.5cm Recommend outpatient thyroid US if patient wishes    Incidental left upper lobe groundglass opacity  -1.6 cm, radiology recommending 31-monthfollow-up to ensure clearance with noncontrast chest CT  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 Follow up outpatient   DVT prophylaxis: Lovenox Code Status: DNR Family Communication: Daughter updated at bedside  Status is: Inpatient  Dispo: The patient is from: Home              Anticipated d/c is to: Inpatient rehab per PT              Anticipated d/c date is: 24 to 48 hours pending clinical course              Patient currently not medically stable for discharge  Consultants:  Neurology  Procedures:  None  Antimicrobials:  None indicated  Subjective: No acute issues or events overnight denies nausea vomiting diarrhea constipation headache fevers chills or chest pain  Objective: Vitals:   10/02/22 1942 10/02/22 2308 10/02/22 2336 10/03/22 0314  BP: 120/83 (!) 144/102 125/78 135/86  Pulse: 74 67  (!) 54  Resp: '20 17  15  '$ Temp: 99.2 F (37.3 C) 99.5 F (37.5 C)  98.6 F (37 C)  TempSrc: Oral Oral  Oral  SpO2: 96% 96%  96%  Weight:      Height:        Intake/Output Summary (Last 24 hours) at 10/03/2022 0726 Last data filed at 10/02/2022 2200 Gross per 24 hour  Intake 100 ml  Output --  Net 100 ml    Filed Weights   10/02/22 0214  Weight: 60.8 kg    Examination:  General:  Pleasantly resting in bed, No acute distress. HEENT:  Sclerae  nonicteric, noninjected.  Extraocular movements intact bilaterally. Neck:  Without mass or deformity.  Trachea is midline. Lungs:  Clear to auscultate bilaterally without rhonchi, wheeze, or rales. Heart:  Regular rate and rhythm.  Without murmurs, rubs, or gallops. Abdomen:  Soft, nontender, nondistended.  Without guarding or rebound. Extremities: Right upper extremity in cast from elbow distally to hand  Data Reviewed: I have personally reviewed following labs and imaging studies  CBC: Recent Labs  Lab 10/01/22 1442  WBC 8.1  NEUTROABS 5.8  HGB 12.3  HCT 37.8  MCV 93.3  PLT 226    Basic Metabolic Panel: Recent Labs  Lab 10/01/22 1442 10/02/22 0105  NA 143 142  K 3.9 3.8  CL 104 108  CO2 27 26  GLUCOSE 82 104*  BUN 18 14  CREATININE 0.86 0.77  CALCIUM 9.7 8.7*    GFR: Estimated Creatinine Clearance: 54.3 mL/min (by C-G formula based on SCr of 0.77 mg/dL). Liver Function Tests: Recent Labs  Lab 10/01/22 1442  AST 22  ALT 18  ALKPHOS 101  BILITOT 0.7  PROT 5.9*  ALBUMIN 3.0*    No results for input(s): "LIPASE", "AMYLASE" in the last 168 hours. No results for input(s): "AMMONIA" in the last 168 hours. Coagulation Profile: Recent Labs  Lab 10/02/22 0105  INR 1.1    Cardiac Enzymes: No results for input(s): "CKTOTAL", "CKMB", "CKMBINDEX", "TROPONINI" in the last 168 hours. BNP (last 3 results) No results for input(s): "PROBNP" in the last 8760 hours. HbA1C: No results for input(s): "HGBA1C" in the last 72 hours. CBG: Recent Labs  Lab 10/02/22 1257  GLUCAP 110*   Lipid Profile: Recent Labs    10/02/22 0105  CHOL 133  HDL 59  LDLCALC 57  TRIG 85  CHOLHDL 2.3    Thyroid Function Tests: Recent Labs    10/01/22 1545  TSH 1.632    Radiology Studies: CT ANGIO HEAD NECK W WO CM  Result Date: 10/02/2022 CLINICAL DATA:  Acute neurologic deficit EXAM: CT ANGIOGRAPHY HEAD AND NECK TECHNIQUE: Multidetector CT imaging of the head and neck was  performed using the standard protocol during bolus administration of intravenous contrast. Multiplanar CT image reconstructions and MIPs were obtained to evaluate the vascular anatomy. Carotid stenosis measurements (when applicable) are obtained utilizing NASCET criteria, using the distal internal carotid diameter as the denominator. RADIATION DOSE REDUCTION: This exam was performed according to the departmental dose-optimization program which includes automated exposure control, adjustment of the mA and/or kV according to patient size and/or use of iterative reconstruction technique. CONTRAST:  37m OMNIPAQUE IOHEXOL 350 MG/ML SOLN COMPARISON:  None Available. FINDINGS: CT HEAD FINDINGS Brain: There is no mass, hemorrhage or extra-axial collection. There is generalized atrophy without lobar predilection. Old left cerebellar infarct There is hypoattenuation of the periventricular white matter, most commonly indicating chronic ischemic microangiopathy. Skull: The visualized skull base, calvarium and extracranial soft tissues are normal. Sinuses/Orbits: No fluid levels or advanced mucosal thickening of the visualized paranasal sinuses. No mastoid or  middle ear effusion. The orbits are normal. CTA NECK FINDINGS SKELETON: There is no bony spinal canal stenosis. No lytic or blastic lesion. OTHER NECK: Normal pharynx, larynx and major salivary glands. No cervical lymphadenopathy. Unremarkable thyroid gland. UPPER CHEST: Focal heterogeneous ground-glass opacity in the left upper lobe measuring 1.6 cm. AORTIC ARCH: There is calcific atherosclerosis of the aortic arch. There is no aneurysm, dissection or hemodynamically significant stenosis of the visualized portion of the aorta. Conventional 3 vessel aortic branching pattern. The visualized proximal subclavian arteries are widely patent. RIGHT CAROTID SYSTEM: Normal without aneurysm, dissection or stenosis. LEFT CAROTID SYSTEM: Normal without aneurysm, dissection or  stenosis. VERTEBRAL ARTERIES: Left dominant configuration. Severe narrowing of the right vertebral artery origin. Multifocal moderate narrowing of the right V2 and V3 segments. There is severe stenosis of the left vertebral artery origin, but the remainder is normal. CTA HEAD FINDINGS POSTERIOR CIRCULATION: --Vertebral arteries: Normal V4 segments. --Inferior cerebellar arteries: Normal. --Basilar artery: Normal. --Superior cerebellar arteries: Normal. --Posterior cerebral arteries (PCA): Normal. ANTERIOR CIRCULATION: --Intracranial internal carotid arteries: Normal. --Anterior cerebral arteries (ACA): Normal. Both A1 segments are present. Patent anterior communicating artery (a-comm). --Middle cerebral arteries (MCA): Normal. VENOUS SINUSES: As permitted by contrast timing, patent. ANATOMIC VARIANTS: None Review of the MIP images confirms the above findings. IMPRESSION: 1. No emergent large vessel occlusion or high-grade stenosis of the intracranial arteries. 2. 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 Multifocal moderate narrowing of the right V2 and V3 segments. 3. Focal heterogeneous ground-glass opacity in the left upper lobe measuring 1.6 cm. This could be infectious or neoplastic. Per Fleischner Society Guidelines, recommend a non-contrast Chest CT at 6 months to confirm persistence, then additional non-contrast Chest CTs every 2 years until 5 years. If nodule grows or develops solid component(s), consider resection. These guidelines do not apply to immunocompromised patients and patients with cancer. Follow up in patients with significant comorbidities as clinically warranted. For lung cancer screening, adhere to Lung-RADS guidelines. Reference: Radiology. 2017; 284(1):228-43. Aortic atherosclerosis (ICD10-I70.0). Electronically Signed   By: Ulyses Jarred M.D.   On: 10/02/2022 01:43   MR BRAIN WO CONTRAST  Result Date: 10/01/2022 CLINICAL DATA:  Slurred speech and difficulty walking  EXAM: MRI HEAD WITHOUT CONTRAST TECHNIQUE: Multiplanar, multiecho pulse sequences of the brain and surrounding structures were obtained without intravenous contrast. COMPARISON:  None Available. FINDINGS: Brain: Punctate focus of acute ischemia in the right cerebellar hemisphere. No other acute infarct. No acute or chronic hemorrhage. There is multifocal hyperintense T2-weighted signal within the white matter. Generalized volume loss. Old left cerebellar infarct. The midline structures are normal. Vascular: Major flow voids are preserved. Skull and upper cervical spine: Normal calvarium and skull base. Visualized upper cervical spine and soft tissues are normal. Sinuses/Orbits:No paranasal sinus fluid levels or advanced mucosal thickening. No mastoid or middle ear effusion. Normal orbits. IMPRESSION: 1. Punctate focus of acute ischemia in the right cerebellar hemisphere. No hemorrhage or mass effect. 2. 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 Electronically Signed   By: Ulyses Jarred M.D.   On: 10/01/2022 21:28   CT Cervical Spine Wo Contrast  Result Date: 10/01/2022 CLINICAL DATA:  Trauma EXAM: CT CERVICAL SPINE WITHOUT CONTRAST TECHNIQUE: Multidetector CT imaging of the cervical spine was performed without intravenous contrast. Multiplanar CT image reconstructions were also generated. RADIATION DOSE REDUCTION: This exam was performed according to the departmental dose-optimization program which includes automated exposure control, adjustment of the mA and/or kV according to patient  size and/or use of iterative reconstruction technique. COMPARISON:  CT C Spine 06/15/20 FINDINGS: Alignment: Normal. Skull base and vertebrae: No acute fracture. No primary bone lesion or focal pathologic process. Likely chronic superior endplate height loss at T3. Moderate degenerative changes at C4-C5. Soft tissues and spinal canal: No prevertebral fluid or swelling. No visible canal hematoma.  Disc levels: Moderate spinal canal narrowing at C4-C5 secondary to a disc bulge. There are severe degenerative changes at C1-C2 on the right. Upper chest: Negative. Other: Calcified thyroid nodule on the right measuring up to 8 mm. There is a separate more exophytic nodule along the inferior margin of the right thyroid lobe measuring up to 1.5 cm. IMPRESSION: 1. No acute cervical spine fracture or traumatic malalignment. 2. Moderate spinal canal narrowing at C4-C5 secondary to a disc bulge. 3. Severe degenerative changes at C1-C2 on the right. 4. Right thyroid nodule measuring up to 1.5 cm. Recommend further evaluation with a dedicated thyroid ultrasound, if not previously performed. Electronically Signed   By: Marin Roberts M.D.   On: 10/01/2022 15:39   CT HEAD WO CONTRAST (5MM)  Result Date: 10/01/2022 CLINICAL DATA:  Head trauma. EXAM: CT HEAD WITHOUT CONTRAST TECHNIQUE: Contiguous axial images were obtained from the base of the skull through the vertex without intravenous contrast. RADIATION DOSE REDUCTION: This exam was performed according to the departmental dose-optimization program which includes automated exposure control, adjustment of the mA and/or kV according to patient size and/or use of iterative reconstruction technique. COMPARISON:  CT head 06/15/2020.  MRI brain 06/20/2020. FINDINGS: Brain: No evidence of acute infarction, hemorrhage, hydrocephalus, extra-axial collection or mass lesion/mass effect. Again seen is moderate diffuse atrophy with compensatory dilatation of the ventricular system, unchanged. There is stable mild periventricular white matter hypodensity, likely chronic small vessel ischemic change. There is an old lacunar infarct in the right basal ganglia. There is also small chronic infarct in the left cerebellum, new from prior. Vascular: Atherosclerotic calcifications are present within the cavernous internal carotid arteries. Skull: Normal. Negative for fracture or focal lesion.  Sinuses/Orbits: No acute finding. Other: None. IMPRESSION: 1. No acute intracranial process. 2. Stable moderate diffuse atrophy. 3. Stable mild chronic small vessel ischemic change. 4. Small chronic infarct in the left cerebellum, new from prior. Electronically Signed   By: Ronney Asters M.D.   On: 10/01/2022 15:33   DG Chest 2 View  Result Date: 10/01/2022 CLINICAL DATA:  Fatigue, falls, dysarthria EXAM: CHEST - 2 VIEW COMPARISON:  06/30/2020 FINDINGS: Atherosclerotic calcification of the aortic arch. Old healed right rib fractures. Bilateral breast implants. Heart size within normal limits for AP projection. The lungs appear clear. Bony demineralization noted. Multiple remote thoracic compression fractures. No blunting of the costophrenic angles. IMPRESSION: 1. No acute cardiopulmonary disease is radiographically apparent. 2. Multiple remote thoracic compression fractures. 3. Atherosclerotic calcification of the aortic arch. Electronically Signed   By: Van Clines M.D.   On: 10/01/2022 15:15    Scheduled Meds:   stroke: early stages of recovery book   Does not apply Once   aspirin EC  81 mg Oral Daily   atorvastatin  10 mg Oral Daily   Deutetrabenazine  2 tablet Oral BID   donepezil  5 mg Oral Daily   enoxaparin (LOVENOX) injection  40 mg Subcutaneous Q24H   escitalopram  20 mg Oral Daily   losartan  50 mg Oral Daily   nicotine  21 mg Transdermal Daily   Continuous Infusions:   LOS: 1 day  Time spent: 27mn  Bobbette Eakes C Casimir Barcellos, DO [facility]  If 7PM-7AM, please contact night-coverage [email or web address]  10/03/2022, 7:26 AM

## 2022-10-03 NOTE — Progress Notes (Addendum)
STROKE TEAM PROGRESS NOTE   INTERVAL HISTORY Her daughter is at the bedside. Patient in good spirits. On exam she was A&Ox4 with notable slurred speech but without other notable deficits. She did not endorse any somatic complaints or side-effects attributable to medications.  12/3: no overnight events. Patient deemed potential candidate for CIR, seen by SLP with recommendations for dysphagia 3 diet.  Vitals:   10/02/22 2308 10/02/22 2336 10/03/22 0314 10/03/22 0729  BP: (!) 144/102 125/78 135/86 (!) 158/111  Pulse: 67  (!) 54 77  Resp: 17  15 (!) 24  Temp: 99.5 F (37.5 C)  98.6 F (37 C) 98.3 F (36.8 C)  TempSrc: Oral  Oral Oral  SpO2: 96%  96% 95%  Weight:      Height:       CBC:  Recent Labs  Lab 10/01/22 1442  WBC 8.1  NEUTROABS 5.8  HGB 12.3  HCT 37.8  MCV 93.3  PLT 947   Basic Metabolic Panel:  Recent Labs  Lab 10/01/22 1442 10/02/22 0105  NA 143 142  K 3.9 3.8  CL 104 108  CO2 27 26  GLUCOSE 82 104*  BUN 18 14  CREATININE 0.86 0.77  CALCIUM 9.7 8.7*   Lipid Panel:  Recent Labs  Lab 10/02/22 0105  CHOL 133  TRIG 85  HDL 59  CHOLHDL 2.3  VLDL 17  LDLCALC 57   HgbA1c: No results for input(s): "HGBA1C" in the last 168 hours. Urine Drug Screen: No results for input(s): "LABOPIA", "COCAINSCRNUR", "LABBENZ", "AMPHETMU", "THCU", "LABBARB" in the last 168 hours.  Alcohol Level No results for input(s): "ETH" in the last 168 hours.  IMAGING past 24 hours ECHOCARDIOGRAM COMPLETE  Result Date: 10/03/2022    ECHOCARDIOGRAM REPORT   Patient Name:   Barbara Krause Date of Exam: 10/03/2022 Medical Rec #:  [id number]     Height:       66.0 in Accession #:    [id number]    Weight:       134.0 lb Date of Birth:  22-Aug-1941     BSA:          1.687 m Patient Age:    81 years      BP:           158/111 mmHg Patient Gender: F             HR:           69 bpm. Exam Location:  Inpatient Procedure: 2D Echo, Color Doppler and Cardiac Doppler Indications:    Stroke i63.9   History:        Patient has no prior history of Echocardiogram examinations.                 Risk Factors:Hypertension and Dyslipidemia.  Sonographer:    Raquel Sarna Senior RDCS Referring Phys: [id number] Orma Flaming  Sonographer Comments: No visible apical window due to mastectomy and implant location. IMPRESSIONS  1. Left ventricular ejection fraction, by estimation, is 60 to 65%. The left ventricle has normal function. The left ventricle has no regional wall motion abnormalities. Left ventricular diastolic function could not be evaluated.  2. Right ventricular systolic function is normal. The right ventricular size is normal. There is mildly elevated pulmonary artery systolic pressure. The estimated right ventricular systolic pressure is 49.6 mmHg.  3. The mitral valve is normal in structure. No evidence of mitral valve regurgitation. No evidence of mitral stenosis.  4. Tricuspid valve regurgitation is mild to moderate.  5. The aortic valve is tricuspid. Aortic valve regurgitation is not visualized. No aortic stenosis is present.  6. Aortic dilatation noted. There is mild dilatation of the ascending aorta, measuring 44 mm.  7. The inferior vena cava is normal in size with greater than 50% respiratory variability, suggesting right atrial pressure of 3 mmHg. Conclusion(s)/Recommendation(s): No intracardiac source of embolism detected on this transthoracic study. Consider a transesophageal echocardiogram to exclude cardiac source of embolism if clinically indicated. FINDINGS  Left Ventricle: Left ventricular ejection fraction, by estimation, is 60 to 65%. The left ventricle has normal function. The left ventricle has no regional wall motion abnormalities. The left ventricular internal cavity size was normal in size. There is  no left ventricular hypertrophy. Left ventricular diastolic function could not be evaluated. Right Ventricle: The right ventricular size is normal. No increase in right ventricular wall thickness.  Right ventricular systolic function is normal. There is mildly elevated pulmonary artery systolic pressure. The tricuspid regurgitant velocity is 2.76  m/s, and with an assumed right atrial pressure of 8 mmHg, the estimated right ventricular systolic pressure is 72.5 mmHg. Left Atrium: Left atrial size was normal in size. Right Atrium: Right atrial size was normal in size. Pericardium: There is no evidence of pericardial effusion. Mitral Valve: The mitral valve is normal in structure. No evidence of mitral valve regurgitation. No evidence of mitral valve stenosis. Tricuspid Valve: The tricuspid valve is normal in structure. Tricuspid valve regurgitation is mild to moderate. No evidence of tricuspid stenosis. Aortic Valve: The aortic valve is tricuspid. Aortic valve regurgitation is not visualized. No aortic stenosis is present. Pulmonic Valve: The pulmonic valve was normal in structure. Pulmonic valve regurgitation is trivial. No evidence of pulmonic stenosis. Aorta: Aortic dilatation noted. There is mild dilatation of the ascending aorta, measuring 44 mm. Venous: The inferior vena cava is normal in size with greater than 50% respiratory variability, suggesting right atrial pressure of 3 mmHg. IAS/Shunts: No atrial level shunt detected by color flow Doppler.  LEFT VENTRICLE PLAX 2D LVIDd:         4.10 cm LVIDs:         2.60 cm LV PW:         0.90 cm LV IVS:        0.80 cm LVOT diam:     1.90 cm LV SV:         56 LV SV Index:   33 LVOT Area:     2.84 cm  LEFT ATRIUM         Index LA diam:    3.20 cm 1.90 cm/m  AORTIC VALVE LVOT Vmax:   94.00 cm/s LVOT Vmean:  69.700 cm/s LVOT VTI:    0.198 m  AORTA Ao Root diam: 2.95 cm Ao Asc diam:  4.35 cm TRICUSPID VALVE TR Peak grad:   30.5 mmHg TR Vmax:        276.00 cm/s  SHUNTS Systemic VTI:  0.20 m Systemic Diam: 1.90 cm Candee Furbish MD Electronically signed by Candee Furbish MD Signature Date/Time: 10/03/2022/9:26:39 AM    Final     PHYSICAL EXAM General: The patient appears  in no acute distress HEENT: normocephalic and atraumatic Respiratory: normal respiratory effort and on RA Gastrointestinal: non-tender and non-distended Extremities: moving all extremities spontaneously  Mental Status: Barbara Krause is alert; she is oriented to person, place, time, and situation. Speech was clear and slurred without evidence of aphasia. She was able to follow 3 step commands without difficulty.  Cranial Nerves: II:  Visual fields grossly normal; pupils equal, round, reactive to light and accommodation III,IV, VI: no ptosis, extra-ocular motions intact bilaterally V,VII: smile asymmetric with notable droop on L side, facial light touch sensation intact bilaterally VIII: hearing not tested during this encounter IX,X: uvula rises symmetrically XI: shoulder symmetrically elevate bilaterally XII: midline tongue extension without atrophy and without fasciculations  Motor: Right : Upper extremity   5/5    Left:     Upper extremity   5/5  Lower extremity   5/5     Lower extremity   5/5  Tone and bulk: normal tone throughout; no atrophy noted  Sensory: sensation to light touch intact throughout bilaterally  Cerebellar: Finger-to-nose test normal, heel-to-shin test not assessed during this encounter  Gait: not observed during encounter  ASSESSMENT/PLAN Ms. Barbara Krause is a 81 y.o. female with a PMHx of Huntington's disease, breast cancer and osteoporosis, who presented to the ED for 6 days of slurred speech after a fall with head-strike 2/2 syncopal spell. MRI brain showed diffuse cerebral atrophy and an acute punctate ischemic infarction in the right cerebellar hemisphere   #Stroke: right cerebellar hemisphere punctate focus of acute ischemia Code Stroke CT head no acute intracranial process. Stable mid chronic small vessel ischemic change. Stable moderate diffuse atrophy. 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 Multifocal moderate narrowing of the right V2 and V3 segments MRI shows punctate focus of acute ischemia in the right cerebellar hemisphere. No hemorrhage or mass effect. 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 LV with normal function LDL 57 HgbA1c pending VTE prophylaxis - enoxaparin    Diet   DIET DYS 3 Room service appropriate? Yes; Fluid consistency: Thin  Continue aspirin 81 mg, clopidogrel for 21 days then aspirin alone  No antithrombotic prior to admission Therapy recommendations: continued PT and OT treatment Disposition: pending, likely CIR if meets criteria  #Hypertension, chronic Chronic, stable. Patient beyond permissive hypertension window, long-term BP goal normotensive. Systolic today 256. Continue home losartan 50 mg daily, consider increasing if BP continues to be elevated  #Hyperlipidemia, chronic Home meds: atorvastatin 10 mg daily, increased dose to 40 mg daily LDL 57, goal < 70 Continue statin at discharge  #Tobacco use Quit 2 weeks ago Continue nicotine patch   Other medical conditions to be managed per primary team / outpatient providers: 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[place]) Followed by Rob Hickman Continue austedo  Fall and aspiration precautions ongoing PT/OT following, patient walks with a walker, new right arm cast makes this somewhat difficult   High cholesterol Lipid panel unremarkable, continue statin   Depression, major, single episode, complete remission (HCC) Continue lexapro    Dementia ([place]) Appears mild Continue aricept daily  Delirium precautions    Right thyroid nodule Measuring upto 1.5cm Recommend outpatient thyroid US if patient wishes    Incidental left upper lobe groundglass opacity  -1.6 cm, radiology recommending 27-monthfollow-up to ensure clearance with noncontrast chest CT   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  Echo was unremarkable.  Continue DAPT therapy for 3 weeks then aspirin alone.  She can follow-up in stroke clinic outpatient after discharge with Dr. SLeonie Man  Neurology will sign off please call with questions.  Dr. PReeves Forthevaluated pt  independently, reviewed imaging, chart, labs. Discussed and formulated plan with the Resident/APP. Changes were made to the note where appropriate. Please see APP/resident note above for details.    Marya Lowden,MD  To contact Stroke Continuity provider, please refer to [email or web address] After hours, contact General Neurology

## 2022-10-03 NOTE — PMR Pre-admission (Signed)
PMR Admission Coordinator Pre-Admission Assessment  Patient: Barbara Krause is an 81 y.o., female MRN: [id number] DOB: 01/29/1941 Height: 5' 6" (167.6 cm) Weight: 60.8 kg  Insurance Information HMO:     PPO:      PCP:      IPA:      80/20: yes     OTHER:  PRIMARY: Medicare A & B      Policy#: [id number]      Subscriber: patient CM Name:       Phone#:      Fax#:  Pre-Cert#:       Employer:  Benefits:  Phone #: verified eligibility via [employer] on 10/03/22     Name:  Eff. Date: Part A effective 10/01/08, Part B effective 06/01/12     Deduct: $1,600      Out of Pocket Max: NA      Life Max: NA CIR: 100% coverage      SNF: 100% days 1-20, 80% coverage days 21-100 Outpatient: 80% coverage     Co-Pay: 20% Home Health: 100% coverage      Co-Pay:  DME: 80% coverage     Co-Pay: 20% Providers: pt's choice SECONDARY: BCBS/Federal Emp       Policy#: [id number]     Phone#: [phone]  Financial Counselor:       Phone#:   The "Data Collection Information Summary" for patients in Inpatient Rehabilitation Facilities with attached "Privacy Act Statement-Health Care Records" was provided and verbally reviewed with: Patient and Family  Emergency Contact Information Contact Information     Name Relation Home Work Mobile   pais,loreen Daughter [phone]     Napier,liana Daughter [phone]         Current Medical History  Patient Admitting Diagnosis: CVA History of Present Illness: Pt is a 80 year old female with medical hx significant for: HTN, Huntington's disease, dementia, depression, breast CA, kidney stones, emphysema. Pt presented to [facility] on 10/01/22 d/t a fall 10 days prior to admission and speech changes and somnolence. CT scan negative for acute abnormalities. 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 CTA head and neck negative for LVO. CTA also revealed ground-glass opacity in left upper lobe measuring 1.6 cm. Therapy evaluations completed  and CIR recommended d/t pt's deficits in functional mobility and speech deficits.  Complete NIHSS TOTAL: 2  Patient's medical record from [facility] has been reviewed by the rehabilitation admission coordinator and physician.  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 No  Family History   family history includes Colon cancer in her mother and paternal grandmother; Diabetes in her maternal grandfather and paternal grandfather; Healthy in her daughter and daughter; Heart disease in her father; Ovarian cancer in her sister; Stomach cancer in her maternal grandmother; Stroke in her father.  Current Medications  Current Facility-Administered Medications:    acetaminophen (TYLENOL) tablet 650 mg, 650 mg, Oral, Q4H PRN **OR** acetaminophen (TYLENOL) 160 MG/5ML solution 650 mg, 650 mg, Per Tube, Q4H PRN **OR** acetaminophen (TYLENOL) suppository 650 mg, 650 mg, Rectal, Q4H PRN, Wolfe, Allison, MD   aspirin EC tablet 81 mg, 81 mg, Oral, Daily,   Wolfe, Allison, MD, 81 mg at 10/03/22 1010   atorvastatin (LIPITOR) tablet 40 mg, 40 mg, Oral, Daily, Tan, Daniel, MD   clopidogrel (PLAVIX) tablet 75 mg, 75 mg, Oral, Daily, Tan, Daniel, MD, 75 mg at 10/03/22 1219   Deutetrabenazine TABS 2 tablet, 2 tablet, Oral, BID, Wolfe, Allison, MD, 2 tablet at 10/03/22 2112   donepezil (ARICEPT) tablet 5 mg, 5 mg, Oral, Daily, Wolfe, Allison, MD, 5 mg at 10/03/22 0847   enoxaparin (LOVENOX) injection 40 mg, 40 mg, Subcutaneous, Q24H, Wolfe, Allison, MD, 40 mg at 10/03/22 0846   escitalopram (LEXAPRO) tablet 20 mg, 20 mg, Oral, Daily, Wolfe, Allison, MD, 20 mg at 10/03/22 0847   losartan (COZAAR) tablet 50 mg, 50 mg, Oral, Daily, Wolfe, Allison, MD, 50  mg at 10/03/22 0847   nicotine (NICODERM CQ - dosed in mg/24 hours) patch 21 mg, 21 mg, Transdermal, Daily, Wolfe, Allison, MD, 21 mg at 10/03/22 0847   senna-docusate (Senokot-S) tablet 1 tablet, 1 tablet, Oral, QHS PRN, Wolfe, Allison, MD  Patients Current Diet:  Diet Order             DIET DYS 3 Room service appropriate? Yes; Fluid consistency: Thin  Diet effective now                   Precautions / Restrictions Precautions Precautions: Fall Restrictions Weight Bearing Restrictions: Yes RUE Weight Bearing: Weight bear through elbow only Other Position/Activity Restrictions: RT Wrist fracture ~3 weeks ago   Has the patient had 2 or more falls or a fall with injury in the past year? Yes  Prior Activity Level Limited Community (1-2x/wk): medical appointments  Prior Functional Level Self Care: Did the patient need help bathing, dressing, using the toilet or eating? Independent  Indoor Mobility: Did the patient need assistance with walking from room to room (with or without device)? Independent  Stairs: Did the patient need assistance with internal or external stairs (with or without device)? Independent  Functional Cognition: Did the patient need help planning regular tasks such as shopping or remembering to take medications? Independent  Patient Information Are you of Hispanic, Latino/a,or Spanish origin?: A. No, not of Hispanic, Latino/a, or Spanish origin What is your race?: A. White Do you need or want an interpreter to communicate with a doctor or health care staff?: 0. No  Patient's Response To:  Health Literacy and Transportation Is the patient able to respond to health literacy and transportation needs?: Yes Health Literacy - How often do you need to have someone help you when you read instructions, pamphlets, or other written material from your doctor or pharmacy?: Never In the past 12 months, has lack of transportation kept you from medical appointments or  from getting medications?: No In the past 12 months, has lack of transportation kept you from meetings, work, or from getting things needed for daily living?: No  Home Assistive Devices / Equipment Home Assistive Devices/Equipment: Walker (specify type) Home Equipment: Rolling Walker (2 wheels), Wheelchair - manual, Grab bars - tub/shower, Grab bars - toilet, Shower seat, BSC/3in1, Hand held shower head  Prior Device Use: Indicate devices/aids used by the patient prior to current illness, exacerbation or injury? None of the above  Current Functional Level Cognition  Arousal/Alertness: Awake/alert Overall Cognitive Status: Impaired/Different from baseline Orientation Level: Oriented X4 Following Commands: Follows one step commands with increased time, Follows one step commands consistently, Follows multi-step commands inconsistently Safety/Judgement: Decreased awareness of safety Memory: Impaired Memory Impairment: Decreased recall of   new information, Retrieval deficit Awareness: Impaired Awareness Impairment: Emergent impairment Problem Solving: Impaired Problem Solving Impairment: Verbal basic, Verbal complex    Extremity Assessment (includes Sensation/Coordination)  Upper Extremity Assessment: RUE deficits/detail, LUE deficits/detail RUE Deficits / Details: Cast from forearm to hand due to wrist fracture. Able able to bind RT fingers around platform walker handle. Able to raise RT UE at shoulder to 90* RUE: Unable to fully assess due to immobilization RUE Coordination: decreased fine motor LUE Deficits / Details: Noted intention tremor (H/o Huntington's), and CMC OA LUE Coordination: decreased fine motor  Lower Extremity Assessment: Generalized weakness, RLE deficits/detail, LLE deficits/detail RLE Deficits / Details: generalized 3+/5 for hip flexion, knee ext, and ankle DF/PF. RLE Sensation: WNL RLE Coordination: decreased gross motor LLE Deficits / Details: generalized 3+/5 for  hip flexion, knee ext, and ankle DF/PF. LLE Sensation: WNL LLE Coordination: decreased gross motor    ADLs  Overall ADL's : Needs assistance/impaired Eating/Feeding: Set up, Minimal assistance, Sitting Grooming: Standing, Wash/dry hands, Minimal assistance, Cueing for safety Upper Body Bathing: Min guard, Sitting Lower Body Bathing: Minimal assistance, Sitting/lateral leans, Sit to/from stand Upper Body Dressing : Minimal assistance, Sitting Lower Body Dressing: Minimal assistance Lower Body Dressing Details (indicate cue type and reason): Underwear and pants clothing management for toileting. Min As for both task and balance Toilet Transfer: Minimal assistance, Regular Toilet, Grab bars, Moderate assistance Toilet Transfer Details (indicate cue type and reason): Moderate assist to descend to toilet and to EOB with impaired ececntric control. Min As to ascend from recliner and toilet. Toileting- Clothing Manipulation and Hygiene: Sitting/lateral lean, Min guard Functional mobility during ADLs: Minimal assistance, Rolling walker (2 wheels), Moderate assistance (Platform RW)    Mobility  Overal bed mobility: Needs Assistance Bed Mobility: Supine to Sit, Sit to Supine Supine to sit: Supervision, HOB elevated Sit to supine: Supervision General bed mobility comments: increased time    Transfers  Overall transfer level: Needs assistance Equipment used: Rolling walker (2 wheels), Right platform walker Transfers: Sit to/from Stand Sit to Stand: Min assist General transfer comment: increased time to power up    Ambulation / Gait / Stairs / Emergency planning/management officer  Ambulation/Gait Ambulation/Gait assistance: Herbalist (Feet): 100 Feet Assistive device: Rolling walker (2 wheels), Right platform walker Gait Pattern/deviations: Step-through pattern, Decreased step length - right, Decreased step length - left, Shuffle, Narrow base of support, Ataxic General Gait Details: assist to  manage RW and maintain balance. Several start/stops during gait trial. Pt with eyes closed during stops, needing verbal cues to return to task. Gait velocity: decreased    Posture / Balance Balance Overall balance assessment: Needs assistance, History of Falls Sitting-balance support: Feet supported, No upper extremity supported Sitting balance-Leahy Scale: Fair Standing balance support: During functional activity, Reliant on assistive device for balance, Bilateral upper extremity supported Standing balance-Leahy Scale: Poor Standing balance comment: reliant on external assist    Special needs/care consideration    Previous Home Environment (from acute therapy documentation) Living Arrangements: Alone Available Help at Discharge: Family, Personal care attendant, Available 24 hours/day Type of Home: House Home Layout: One level Home Access: Stairs to enter Entrance Stairs-Rails: Can reach both Entrance Stairs-Number of Steps: 3 Bathroom Shower/Tub: Multimedia programmer: Handicapped height Bathroom Accessibility: Yes How Accessible: Accessible via wheelchair, Accessible via walker [place]: No Additional Comments: Pt has a home aid to 7x/wk for ~4 hours per day. She has assist form her daugthers as well, one of whom is an  OT. Someone is with her from ~10am-6pm and then 6pm-9pm. Platform RW since wrist fracture. [name] daughter reported that family is planning to increase CG hours to 10AM-6:00PM  Discharge Living Setting Plans for Discharge Living Setting: Patient's home Type of Home at Discharge: House Discharge Home Layout: One level Discharge Home Access: Stairs to enter Entrance Stairs-Rails: Can reach both Entrance Stairs-Number of Steps: 3 Discharge Bathroom Shower/Tub: Walk-in shower Discharge Bathroom Toilet: Handicapped height Discharge Bathroom Accessibility: Yes How Accessible: Accessible via wheelchair, Accessible via walker Does the patient have any  problems obtaining your medications?: No  Social/Family/Support Systems Anticipated Caregiver: [name] Pais and Liana Napier (daguhters) and personal care aid Anticipated Caregiver's Contact Information: [name]-[phone], Liana-[phone] Caregiver Availability: 24/7 Discharge Plan Discussed with Primary Caregiver: Yes Is Caregiver In Agreement with Plan?: Yes Does Caregiver/Family have Issues with Lodging/Transportation while Pt is in Rehab?: No  Goals Patient/Family Goal for Rehab: Supervision: PT/OT, Mod I:ST Expected length of stay: 7-10 days Pt/Family Agrees to Admission and willing to participate: Yes Program Orientation Provided & Reviewed with Pt/Caregiver Including Roles  & Responsibilities: Yes  Decrease burden of Care through IP rehab admission: NA  Possible need for SNF placement upon discharge: Not anticipated  Patient Condition: I have reviewed medical records from [facility], spoken with CM, and patient and daughter. I met with patient at the bedside and discussed via phone for inpatient rehabilitation assessment.  Patient will benefit from ongoing PT, OT, and SLP, can actively participate in 3 hours of therapy a day 5 days of the week, and can make measurable gains during the admission.  Patient will also benefit from the coordinated team approach during an Inpatient Acute Rehabilitation admission.  The patient will receive intensive therapy as well as Rehabilitation physician, nursing, social worker, and care management interventions.  Due to safety, disease management, medication administration, pain management, and patient education the patient requires 24 hour a day rehabilitation nursing.  The patient is currently Min A with mobility and Min G-Min A with basic ADLs.  Discharge setting and therapy post discharge at home with home health is anticipated.  Patient has agreed to participate in the Acute Inpatient Rehabilitation Program and will admit  today.  Preadmission Screen Completed By:  Lauren P Graves Madden, 10/04/2022 10:15 AM ______________________________________________________________________   Discussed status with Dr.  on 10/04/22. at 10:15 AM and received approval for admission today.  Admission Coordinator:  Lauren P Graves Madden, CCC-SLP, time 10:15 AM/Date 10/04/22    Assessment/Plan: Diagnosis:CVA Does the need for close, 24 hr/day Medical supervision in concert with the patient's rehab needs make it unreasonable for this patient to be served in a less intensive setting? Yes Co-Morbidities requiring supervision/potential complications: HTN, HLD, Tobacco, HLD, dementia,  Due to bladder management, bowel management, safety, skin/wound care, disease management, medication administration, pain management, and patient education, does the patient require 24 hr/day rehab nursing? Yes Does the patient require coordinated care of a physician, rehab nurse, PT, OT, and SLP to address physical and functional deficits in the context of the above medical diagnosis(es)? Yes Addressing deficits in the following areas: balance, endurance, locomotion, strength, transferring, bowel/bladder control, bathing, dressing, feeding, grooming, toileting, cognition, speech, language, swallowing, and psychosocial support Can the patient actively participate in an intensive therapy program of at least 3 hrs of therapy 5 days a week? Yes The potential for patient to make measurable gains while on inpatient rehab is excellent Anticipated functional outcomes upon discharge from inpatient rehab: supervision PT, supervision OT, modified   independent SLP Estimated rehab length of stay to reach the above functional goals is: 7-10  Anticipated discharge destination: Home 10. Overall Rehab/Functional Prognosis: excellent   MD Signature:   

## 2022-10-03 NOTE — Progress Notes (Signed)
Echocardiogram 2D Echocardiogram has been performed.  [place] Deputy [name] RDCS 10/03/2022, 8:21 AM

## 2022-10-03 NOTE — Progress Notes (Signed)
Physical Therapy Treatment Patient Details Name: Barbara Krause MRN: [id number] DOB: [birth year] Today's Date: 10/03/2022   History of Present Illness Patient is 81 y.o. female who presented to ED with a few day history of trouble ambulating and dysarthria. PMH significant of breast cancer s/p total mastectomy, Huntington's disease, depression. Patient had a fall 10 PTA. MRI reveals punctate focus of acute ischemia in the right cerebellar hemisphere and old left cerebellar infarct and findings of chronic small vessel ischemia.    PT Comments    Pt received in bed, reporting having just returned to bed from recliner. Agreeable to ambulation. She required supervision bed mobility, min assist transfers, and min assist ambulation 100' with R platform RW. Pt returned to bed at end of session.    Recommendations for follow up therapy are one component of a multi-disciplinary discharge planning process, led by the attending physician.  Recommendations may be updated based on patient status, additional functional criteria and insurance authorization.  Follow Up Recommendations  Acute inpatient rehab (3hours/day)     Assistance Recommended at Discharge Frequent or constant Supervision/Assistance  Patient can return home with the following A little help with walking and/or transfers;A little help with bathing/dressing/bathroom;Assistance with cooking/housework;Direct supervision/assist for medications management;Assist for transportation;Help with stairs or ramp for entrance   Equipment Recommendations  None recommended by PT    Recommendations for Other Services Rehab consult     Precautions / Restrictions Precautions Precautions: Fall Restrictions RUE Weight Bearing: Weight bear through elbow only Other Position/Activity Restrictions: RT Wrist fracture ~3 weeks ago     Mobility  Bed Mobility Overal bed mobility: Needs Assistance Bed Mobility: Supine to Sit, Sit to Supine     Supine to  sit: Supervision, HOB elevated Sit to supine: Supervision   General bed mobility comments: increased time    Transfers Overall transfer level: Needs assistance Equipment used: Rolling walker (2 wheels), Right platform walker Transfers: Sit to/from Stand Sit to Stand: Min assist           General transfer comment: increased time to power up    Ambulation/Gait Ambulation/Gait assistance: Min assist Gait Distance (Feet): 100 Feet Assistive device: Rolling walker (2 wheels), Right platform walker Gait Pattern/deviations: Step-through pattern, Decreased step length - right, Decreased step length - left, Shuffle, Narrow base of support, Ataxic Gait velocity: decreased     General Gait Details: assist to manage RW and maintain balance. Several start/stops during gait trial. Pt with eyes closed during stops, needing verbal cues to return to task.   Stairs             Wheelchair Mobility    Modified Rankin (Stroke Patients Only)       Balance Overall balance assessment: Needs assistance, History of Falls Sitting-balance support: Feet supported, No upper extremity supported Sitting balance-Leahy Scale: Fair     Standing balance support: During functional activity, Reliant on assistive device for balance, Bilateral upper extremity supported Standing balance-Leahy Scale: Poor                              Cognition Arousal/Alertness: Awake/alert Behavior During Therapy: Flat affect Overall Cognitive Status: Impaired/Different from baseline Area of Impairment: Following commands, Safety/judgement, Awareness, Problem solving                       Following Commands: Follows one step commands with increased time, Follows one step commands consistently, Follows multi-step commands inconsistently  Safety/Judgement: Decreased awareness of safety Awareness: Emergent Problem Solving: Slow processing, Requires verbal cues, Difficulty sequencing           Exercises      General Comments General comments (skin integrity, edema, etc.): HR into 110s during activity      Pertinent Vitals/Pain Pain Assessment Pain Assessment: No/denies pain    Home Living                          Prior Function            PT Goals (current goals can now be found in the care plan section) Acute Rehab PT Goals Patient Stated Goal: regain strength and return home Progress towards PT goals: Progressing toward goals    Frequency    Min 4X/week      PT Plan Current plan remains appropriate    Co-evaluation              AM-PAC PT "6 Clicks" Mobility   Outcome Measure  Help needed turning from your back to your side while in a flat bed without using bedrails?: None Help needed moving from lying on your back to sitting on the side of a flat bed without using bedrails?: A Little Help needed moving to and from a bed to a chair (including a wheelchair)?: A Little Help needed standing up from a chair using your arms (e.g., wheelchair or bedside chair)?: A Little Help needed to walk in hospital room?: A Little Help needed climbing 3-5 steps with a railing? : A Lot 6 Click Score: 18    End of Session Equipment Utilized During Treatment: Gait belt Activity Tolerance: Patient tolerated treatment well Patient left: in bed;with bed alarm set;with call bell/phone within reach Nurse Communication: Mobility status PT Visit Diagnosis: Unsteadiness on feet (R26.81);Muscle weakness (generalized) (M62.81);Difficulty in walking, not elsewhere classified (R26.2)     Time: 1050-1106 PT Time Calculation (min) (ACUTE ONLY): 16 min  Charges:  $Gait Training: 8-22 mins                     Lorrin Goodell, PT  Office # [phone] Pager [phone]    Barbara Krause 10/03/2022, 12:13 PM

## 2022-10-03 NOTE — Progress Notes (Signed)
Inpatient Rehab Admissions:  Inpatient Rehab Consult received.  I met with patient at the bedside for rehabilitation assessment and to discuss goals and expectations of an inpatient rehab admission.  Discussed average length of stay, discharge home after completion to CIR. Pt acknowledged understanding. Pt interested in pursuing CIR. Pt gave permission to contact daughter [name]. Spoke with [name] on the telephone. She acknowledged understanding of CIR goals and expectations. She is supportive of pt pursuing CIR. She confirmed that pt's aide and family will be able to provide 24/7 support for pt after discharge. Will continue to follow.  Signed: Gayland Curry, [place], [place] Admissions Coordinator [phone]

## 2022-10-04 ENCOUNTER — Inpatient Hospital Stay (HOSPITAL_COMMUNITY): Payer: Medicare Other

## 2022-10-04 ENCOUNTER — Inpatient Hospital Stay (HOSPITAL_COMMUNITY)
Admission: RE | Admit: 2022-10-04 | Discharge: 2022-10-18 | DRG: 056 | Disposition: A | Discharge status: Home Health | Payer: Medicare Other | Source: Intra-hospital | Attending: Physical Medicine and Rehabilitation | Admitting: Physical Medicine and Rehabilitation

## 2022-10-04 ENCOUNTER — Other Ambulatory Visit: Payer: Self-pay

## 2022-10-04 ENCOUNTER — Encounter (HOSPITAL_COMMUNITY): Payer: Self-pay | Admitting: Physical Medicine and Rehabilitation

## 2022-10-04 DIAGNOSIS — K5900 Constipation, unspecified: Secondary | ICD-10-CM | POA: Diagnosis not present

## 2022-10-04 DIAGNOSIS — Z9013 Acquired absence of bilateral breasts and nipples: Secondary | ICD-10-CM | POA: Diagnosis not present

## 2022-10-04 DIAGNOSIS — I63541 Cerebral infarction due to unspecified occlusion or stenosis of right cerebellar artery: Secondary | ICD-10-CM | POA: Diagnosis not present

## 2022-10-04 DIAGNOSIS — E440 Moderate protein-calorie malnutrition: Secondary | ICD-10-CM | POA: Diagnosis present

## 2022-10-04 DIAGNOSIS — J189 Pneumonia, unspecified organism: Secondary | ICD-10-CM | POA: Diagnosis not present

## 2022-10-04 DIAGNOSIS — I69391 Dysphagia following cerebral infarction: Secondary | ICD-10-CM

## 2022-10-04 DIAGNOSIS — R269 Unspecified abnormalities of gait and mobility: Secondary | ICD-10-CM | POA: Diagnosis present

## 2022-10-04 DIAGNOSIS — E785 Hyperlipidemia, unspecified: Secondary | ICD-10-CM | POA: Diagnosis present

## 2022-10-04 DIAGNOSIS — G10 Huntington's disease: Secondary | ICD-10-CM | POA: Diagnosis present

## 2022-10-04 DIAGNOSIS — Z880 Allergy status to penicillin: Secondary | ICD-10-CM | POA: Diagnosis not present

## 2022-10-04 DIAGNOSIS — R051 Acute cough: Secondary | ICD-10-CM

## 2022-10-04 DIAGNOSIS — S52501S Unspecified fracture of the lower end of right radius, sequela: Secondary | ICD-10-CM

## 2022-10-04 DIAGNOSIS — W19XXXD Unspecified fall, subsequent encounter: Secondary | ICD-10-CM | POA: Diagnosis present

## 2022-10-04 DIAGNOSIS — R319 Hematuria, unspecified: Secondary | ICD-10-CM | POA: Diagnosis not present

## 2022-10-04 DIAGNOSIS — S52501D Unspecified fracture of the lower end of right radius, subsequent encounter for closed fracture with routine healing: Secondary | ICD-10-CM

## 2022-10-04 DIAGNOSIS — B370 Candidal stomatitis: Secondary | ICD-10-CM

## 2022-10-04 DIAGNOSIS — Z8249 Family history of ischemic heart disease and other diseases of the circulatory system: Secondary | ICD-10-CM | POA: Diagnosis not present

## 2022-10-04 DIAGNOSIS — Z853 Personal history of malignant neoplasm of breast: Secondary | ICD-10-CM

## 2022-10-04 DIAGNOSIS — Z7189 Other specified counseling: Secondary | ICD-10-CM | POA: Diagnosis not present

## 2022-10-04 DIAGNOSIS — I69320 Aphasia following cerebral infarction: Secondary | ICD-10-CM | POA: Diagnosis not present

## 2022-10-04 DIAGNOSIS — R001 Bradycardia, unspecified: Secondary | ICD-10-CM | POA: Diagnosis not present

## 2022-10-04 DIAGNOSIS — F03A3 Unspecified dementia, mild, with mood disturbance: Secondary | ICD-10-CM | POA: Diagnosis present

## 2022-10-04 DIAGNOSIS — Z8673 Personal history of transient ischemic attack (TIA), and cerebral infarction without residual deficits: Secondary | ICD-10-CM | POA: Diagnosis present

## 2022-10-04 DIAGNOSIS — F325 Major depressive disorder, single episode, in full remission: Secondary | ICD-10-CM

## 2022-10-04 DIAGNOSIS — F32A Depression, unspecified: Secondary | ICD-10-CM | POA: Diagnosis present

## 2022-10-04 DIAGNOSIS — I10 Essential (primary) hypertension: Secondary | ICD-10-CM | POA: Diagnosis present

## 2022-10-04 DIAGNOSIS — R059 Cough, unspecified: Secondary | ICD-10-CM | POA: Diagnosis present

## 2022-10-04 DIAGNOSIS — Z79899 Other long term (current) drug therapy: Secondary | ICD-10-CM

## 2022-10-04 DIAGNOSIS — F17210 Nicotine dependence, cigarettes, uncomplicated: Secondary | ICD-10-CM | POA: Diagnosis present

## 2022-10-04 DIAGNOSIS — M810 Age-related osteoporosis without current pathological fracture: Secondary | ICD-10-CM | POA: Diagnosis present

## 2022-10-04 DIAGNOSIS — R471 Dysarthria and anarthria: Secondary | ICD-10-CM | POA: Diagnosis present

## 2022-10-04 DIAGNOSIS — R7989 Other specified abnormal findings of blood chemistry: Secondary | ICD-10-CM | POA: Diagnosis not present

## 2022-10-04 DIAGNOSIS — Z66 Do not resuscitate: Secondary | ICD-10-CM | POA: Diagnosis present

## 2022-10-04 DIAGNOSIS — F03A0 Unspecified dementia, mild, without behavioral disturbance, psychotic disturbance, mood disturbance, and anxiety: Secondary | ICD-10-CM

## 2022-10-04 DIAGNOSIS — R5381 Other malaise: Secondary | ICD-10-CM | POA: Diagnosis present

## 2022-10-04 DIAGNOSIS — Z7902 Long term (current) use of antithrombotics/antiplatelets: Secondary | ICD-10-CM

## 2022-10-04 DIAGNOSIS — Z515 Encounter for palliative care: Secondary | ICD-10-CM | POA: Diagnosis not present

## 2022-10-04 DIAGNOSIS — R1310 Dysphagia, unspecified: Secondary | ICD-10-CM

## 2022-10-04 DIAGNOSIS — Z7983 Long term (current) use of bisphosphonates: Secondary | ICD-10-CM

## 2022-10-04 DIAGNOSIS — E041 Nontoxic single thyroid nodule: Secondary | ICD-10-CM | POA: Diagnosis present

## 2022-10-04 DIAGNOSIS — E876 Hypokalemia: Secondary | ICD-10-CM | POA: Diagnosis not present

## 2022-10-04 DIAGNOSIS — L89892 Pressure ulcer of other site, stage 2: Secondary | ICD-10-CM | POA: Diagnosis not present

## 2022-10-04 DIAGNOSIS — Z7982 Long term (current) use of aspirin: Secondary | ICD-10-CM

## 2022-10-04 DIAGNOSIS — Z882 Allergy status to sulfonamides status: Secondary | ICD-10-CM | POA: Diagnosis not present

## 2022-10-04 DIAGNOSIS — I69322 Dysarthria following cerebral infarction: Secondary | ICD-10-CM

## 2022-10-04 DIAGNOSIS — Z6820 Body mass index (BMI) 20.0-20.9, adult: Secondary | ICD-10-CM

## 2022-10-04 LAB — HEMOGLOBIN A1C
Hgb A1c MFr Bld: 5.8 % — ABNORMAL HIGH (ref 4.8–5.6)
Mean Plasma Glucose: 120 mg/dL

## 2022-10-04 MED ORDER — ACETAMINOPHEN 650 MG RE SUPP: 650.0000 mg | RECTAL | Status: DC | PRN

## 2022-10-04 MED ORDER — CLOPIDOGREL BISULFATE 75 MG PO TABS
75.0000 mg | ORAL_TABLET | Freq: Every day | ORAL | Status: DC
  Administered 2022-10-05 – 2022-10-18 (×14): 75 mg via ORAL
  Filled 2022-10-04 (×14): qty 1

## 2022-10-04 MED ORDER — NICOTINE 21 MG/24HR TD PT24
21.0000 mg | MEDICATED_PATCH | Freq: Every day | TRANSDERMAL | Status: DC
  Administered 2022-10-05 – 2022-10-18 (×14): 21 mg via TRANSDERMAL
  Filled 2022-10-04 (×14): qty 1

## 2022-10-04 MED ORDER — ACETAMINOPHEN 325 MG PO TABS: 650.0000 mg | ORAL_TABLET | ORAL | Status: DC | PRN

## 2022-10-04 MED ORDER — NYSTATIN 100000 UNIT/ML MT SUSP
5.0000 mL | Freq: Four times a day (QID) | OROMUCOSAL | Status: AC
  Administered 2022-10-04 – 2022-10-07 (×10): 500000 [IU] via ORAL
  Filled 2022-10-04 (×9): qty 5

## 2022-10-04 MED ORDER — LOSARTAN POTASSIUM 50 MG PO TABS
50.0000 mg | ORAL_TABLET | Freq: Every day | ORAL | Status: DC
  Administered 2022-10-05 – 2022-10-18 (×14): 50 mg via ORAL
  Filled 2022-10-04 (×14): qty 1

## 2022-10-04 MED ORDER — NICOTINE 21 MG/24HR TD PT24: 21.0000 mg | MEDICATED_PATCH | Freq: Every day | TRANSDERMAL | 0 refills | Status: DC

## 2022-10-04 MED ORDER — ASPIRIN 81 MG PO TBEC: 81.0000 mg | DELAYED_RELEASE_TABLET | Freq: Every day | ORAL | 12 refills | Status: AC

## 2022-10-04 MED ORDER — DONEPEZIL HCL 10 MG PO TABS
5.0000 mg | ORAL_TABLET | Freq: Every day | ORAL | Status: DC
  Administered 2022-10-05 – 2022-10-18 (×14): 5 mg via ORAL
  Filled 2022-10-04 (×14): qty 1

## 2022-10-04 MED ORDER — DEUTETRABENAZINE 12 MG PO TABS
2.0000 | ORAL_TABLET | Freq: Two times a day (BID) | ORAL | Status: DC
  Administered 2022-10-04 – 2022-10-11 (×13): 2 via ORAL
  Filled 2022-10-04 (×13): qty 2

## 2022-10-04 MED ORDER — ATORVASTATIN CALCIUM 40 MG PO TABS
40.0000 mg | ORAL_TABLET | Freq: Every day | ORAL | Status: DC
  Administered 2022-10-05 – 2022-10-18 (×14): 40 mg via ORAL
  Filled 2022-10-04 (×13): qty 1

## 2022-10-04 MED ORDER — ESCITALOPRAM OXALATE 10 MG PO TABS
20.0000 mg | ORAL_TABLET | Freq: Every day | ORAL | Status: DC
  Administered 2022-10-05 – 2022-10-18 (×14): 20 mg via ORAL
  Filled 2022-10-04 (×14): qty 2

## 2022-10-04 MED ORDER — ASPIRIN 81 MG PO TBEC
81.0000 mg | DELAYED_RELEASE_TABLET | Freq: Every day | ORAL | Status: DC
  Administered 2022-10-05 – 2022-10-18 (×14): 81 mg via ORAL
  Filled 2022-10-04 (×14): qty 1

## 2022-10-04 MED ORDER — ENOXAPARIN SODIUM 40 MG/0.4ML IJ SOSY
40.0000 mg | PREFILLED_SYRINGE | INTRAMUSCULAR | Status: DC
  Administered 2022-10-05 – 2022-10-17 (×13): 40 mg via SUBCUTANEOUS
  Filled 2022-10-04 (×12): qty 0.4

## 2022-10-04 MED ORDER — ACETAMINOPHEN 160 MG/5ML PO SOLN: 650.0000 mg | ORAL | Status: DC | PRN

## 2022-10-04 MED ORDER — ENOXAPARIN SODIUM 40 MG/0.4ML IJ SOSY: 40.0000 mg | PREFILLED_SYRINGE | INTRAMUSCULAR | Status: DC

## 2022-10-04 MED ORDER — ATORVASTATIN CALCIUM 40 MG PO TABS: 40.0000 mg | ORAL_TABLET | Freq: Every day | ORAL | 1 refills | Status: DC

## 2022-10-04 MED ORDER — NYSTATIN 100000 UNIT/ML MT SUSP: 5.0000 mL | Freq: Four times a day (QID) | OROMUCOSAL | Status: DC

## 2022-10-04 MED ORDER — CLOPIDOGREL BISULFATE 75 MG PO TABS: 75.0000 mg | ORAL_TABLET | Freq: Every day | ORAL | 0 refills | Status: DC

## 2022-10-04 MED ORDER — SENNOSIDES-DOCUSATE SODIUM 8.6-50 MG PO TABS
1.0000 | ORAL_TABLET | Freq: Every evening | ORAL | Status: DC | PRN
  Administered 2022-10-16: 1 via ORAL
  Filled 2022-10-04: qty 1

## 2022-10-04 NOTE — Progress Notes (Signed)
Occupational Therapy Treatment Patient Details Name: Barbara Krause MRN: [id number] DOB: 12-Mar-1941 Today's Date: 10/04/2022   History of present illness Patient is 81 y.o. female who presented to ED with a few day history of trouble ambulating and dysarthria. PMH significant of breast cancer s/p total mastectomy, Huntington's disease, depression. Patient had a fall 10 PTA. MRI reveals punctate focus of acute ischemia in the right cerebellar hemisphere and old left cerebellar infarct and findings of chronic small vessel ischemia.   OT comments  Patient continues to make steady progress towards goals in skilled OT session. Patient's session encompassed ADLs at the sink and functional mobility to improve activity tolerance. Patient with min A for ADLs and functional mobility, with 1 seated rest break needed at sink due to fatigue when completing ADL routine. Patient requiring extra time to respond and follow commands, however was accurate with all responses and actions. OT continues to highly endorse AIR placement due to current level of function, ability to improve, and strong family support.    Recommendations for follow up therapy are one component of a multi-disciplinary discharge planning process, led by the attending physician.  Recommendations may be updated based on patient status, additional functional criteria and insurance authorization.    Follow Up Recommendations  Acute inpatient rehab (3hours/day)     Assistance Recommended at Discharge Frequent or constant Supervision/Assistance  Patient can return home with the following  A little help with bathing/dressing/bathroom;A little help with walking and/or transfers;Assistance with cooking/housework;Assist for transportation;Direct supervision/assist for financial management;Direct supervision/assist for medications management   Equipment Recommendations  BSC/3in1    Recommendations for Other Services      Precautions / Restrictions  Precautions Precautions: Fall Restrictions Weight Bearing Restrictions: Yes RUE Weight Bearing: Weight bear through elbow only Other Position/Activity Restrictions: RT Wrist fracture ~3 weeks ago       Mobility Bed Mobility Overal bed mobility: Needs Assistance Bed Mobility: Supine to Sit     Supine to sit: Supervision, HOB elevated     General bed mobility comments: increased time    Transfers Overall transfer level: Needs assistance Equipment used: Rolling walker (2 wheels), Right platform walker Transfers: Sit to/from Stand Sit to Stand: Min assist           General transfer comment: increased time to power up and to steady given R arm cast     Balance Overall balance assessment: Needs assistance, History of Falls Sitting-balance support: Feet supported, No upper extremity supported Sitting balance-Leahy Scale: Fair     Standing balance support: During functional activity, Reliant on assistive device for balance, Bilateral upper extremity supported Standing balance-Leahy Scale: Poor Standing balance comment: reliant on external assist                           ADL either performed or assessed with clinical judgement   ADL Overall ADL's : Needs assistance/impaired Eating/Feeding: Minimal assistance;Sitting   Grooming: Minimal assistance;Wash/dry face;Wash/dry hands;Oral care;Sitting;Standing Grooming Details (indicate cue type and reason): able to complete initial part in standing, then requiring 1 seated rest break due to fatigue                 Toilet Transfer: Minimal assistance;Ambulation;Rolling walker (2 wheels)           Functional mobility during ADLs: Minimal assistance;Rolling walker (2 wheels) (Platform walker) General ADL Comments: Session focus on ADL routine and functional mobility    Extremity/Trunk Assessment  Vision   Vision Assessment?: No apparent visual deficits   Perception     Praxis       Cognition Arousal/Alertness: Awake/alert Behavior During Therapy: Flat affect Overall Cognitive Status: Impaired/Different from baseline Area of Impairment: Following commands, Safety/judgement, Awareness, Problem solving                       Following Commands: Follows one step commands with increased time, Follows one step commands consistently, Follows multi-step commands inconsistently Safety/Judgement: Decreased awareness of safety Awareness: Emergent Problem Solving: Slow processing, Requires verbal cues, Difficulty sequencing General Comments: Patient very motivated to particpate, requires extended time to reply and follow through with commands        Exercises      Shoulder Instructions       General Comments      Pertinent Vitals/ Pain       Pain Assessment Pain Assessment: No/denies pain  Home Living Family/patient expects to be discharged to:: Private residence Living Arrangements: Alone                                      Prior Functioning/Environment              Frequency  Min 3X/week        Progress Toward Goals  OT Goals(current goals can now be found in the care plan section)  Progress towards OT goals: Progressing toward goals  Acute Rehab OT Goals Patient Stated Goal: To get into rehab OT Goal Formulation: With patient/family Time For Goal Achievement: 10/16/22 Potential to Achieve Goals: Good  Plan Discharge plan remains appropriate    Co-evaluation                 AM-PAC OT "6 Clicks" Daily Activity     Outcome Measure   Help from another person eating meals?: A Little Help from another person taking care of personal grooming?: A Little Help from another person toileting, which includes using toliet, bedpan, or urinal?: A Little Help from another person bathing (including washing, rinsing, drying)?: A Little Help from another person to put on and taking off regular upper body clothing?: A  Little Help from another person to put on and taking off regular lower body clothing?: A Little 6 Click Score: 18    End of Session Equipment Utilized During Treatment: Gait belt;Rolling walker (2 wheels)  OT Visit Diagnosis: Unsteadiness on feet (R26.81);Cognitive communication deficit (R41.841);History of falling (Z91.81);Muscle weakness (generalized) (M62.81) Symptoms and signs involving cognitive functions: Cerebral infarction   Activity Tolerance Patient tolerated treatment well   Patient Left in chair;with call bell/phone within reach;with chair alarm set   Nurse Communication Mobility status        Time: 7408-1448 OT Time Calculation (min): 26 min  Charges: OT General Charges $OT Visit: 1 Visit OT Treatments $Self Care/Home Management : 23-37 mins  [place]. Anaston Koehn, OTR/L Acute Rehabilitation Services [phone]   Ascencion Dike 10/04/2022, 1:09 PM

## 2022-10-04 NOTE — TOC Transition Note (Signed)
Transition of Care [facility]) - CM/SW Discharge Note   Patient Details  Name: Carman Auxier MRN: [id number] Date of Birth: 03/08/1941  Transition [facility] Care [facility]) CM/SW Contact:  Pollie Friar, RN Phone Number: 10/04/2022, 10:23 AM   Clinical Narrative:    Pt is discharging to CIR today. CM signing off.    Final next level of care: IP Rehab Facility Barriers to Discharge: No Barriers Identified   Patient Goals and CMS Choice   CMS Medicare.gov Compare Post Acute Care list provided to:: Patient Choice offered to / list presented to : Patient  Discharge Placement                       Discharge Plan and Services                                     Social Determinants of Health (SDOH) Interventions     Readmission Risk Interventions     No data to display

## 2022-10-04 NOTE — Progress Notes (Signed)
Patient awaiting x -ray followed up with x-ray representative; patient is on list and x-ray to be obtained. Notified oncoming nurse of MD order.    Yehuda Mao, LPN

## 2022-10-04 NOTE — Progress Notes (Signed)
Inpatient Rehab Admissions Coordinator:  There is a bed available for pt in CIR today. Dr. Avon Gully is aware and in agreement. Pt, pt's daughter [name], NSG, and TOC made aware.    Gayland Curry, [place], [place] Admissions Coordinator [phone]

## 2022-10-04 NOTE — H&P (Addendum)
Physical Medicine and Rehabilitation Admission H&P    Chief Complaint  Patient presents with   Aphasia   : HPI: Barbara Krause is a 81 year old right-handed female with history significant for hypertension, breast cancer status post bilateral mastectomy 2009, memory deficit is maintained on Aricept, Huntington's disease followed at [facility], osteoporosis, history of tobacco use.  Per chart review patient lives alone.  1 level home 3 steps to entry.  She has a home health aide 7 times a week for 4 hours/day.  She also has assistance from her daughters.  Patient uses a platform rolling walker for mobility.  Family plans to increase PCA hours as needed.  Presented 10/01/2022 with slurred speech.  Noted recent fall when she struck her head no loss of consciousness and family noted increased somnolence over the past week.  CT/MRI showed punctate focus of acute ischemia in the right cerebellar hemisphere.  No hemorrhage or mass effect.  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  CT cervical spine negative for fracture or malalignment.  Noted right thyroid nodule measuring 1.5 cm recommend outpatient follow-up.  Admission chemistries unremarkable, urinalysis negative.  Echocardiogram with ejection fraction of 60 to 65% no wall motion abnormalities.  CT angiogram head and neck no emergent large vessel occlusion or high-grade stenosis.  Noted findings of groundglass opacity left upper lobe measuring 1.6 cm recommend outpatient follow-up.  Neurology follow-up placed on aspirin and Plavix x 21 days then aspirin alone.  Lovenox added for DVT prophylaxis.  Tolerating a mechanical soft diet.  She reports worsening cough last 2-3 days. She is followed by [facility] for Right wrist distal radius fracture. Therapy evaluations completed due to patient decreased functional mobility and dysarthria was admitted for a comprehensive rehab program.  Review of Systems   Constitutional:  Negative for chills and fever.  HENT:  Negative for hearing loss.   Eyes:  Negative for blurred vision and double vision.  Respiratory:  Positive for cough. Negative for shortness of breath and wheezing.   Cardiovascular:  Negative for chest pain, palpitations and leg swelling.  Gastrointestinal:  Positive for constipation. Negative for abdominal pain.  Genitourinary:  Negative for dysuria, flank pain and hematuria.  Musculoskeletal:  Positive for falls, joint pain and myalgias.  Skin:  Negative for rash.  Neurological:  Positive for tremors, speech change and weakness.  Psychiatric/Behavioral:  Positive for depression and memory loss. The patient has insomnia.   All other systems reviewed and are negative.  Past Medical History:  Diagnosis Date   Breast cancer ([place]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   Depression    History of chicken pox    History of colon polyps    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[place])    Kidney stone    Osteoporosis    Past Surgical History:  Procedure Laterality Date   CESAREAN SECTION     1977 and 1979   CHOLECYSTECTOMY     TOTAL MASTECTOMY Bilateral 2009   Family History  Problem Relation Age of Onset   Colon cancer Mother    Stroke Father    Heart disease Father    Ovarian cancer Sister    Healthy Daughter    Stomach cancer Maternal Grandmother    Diabetes Maternal Grandfather    Colon cancer Paternal Grandmother    Diabetes Paternal Grandfather    Healthy Daughter    Social History:  reports that she has been smoking cigarettes. She has a 37.50 pack-year smoking history. She has  never used smokeless tobacco. She reports current alcohol use. She reports that she does not use drugs. Allergies:  Allergies  Allergen Reactions   Penicillin G Hives   Bactrim [Sulfamethoxazole-Trimethoprim] Rash   Medications Prior to Admission  Medication Sig Dispense Refill   alendronate (FOSAMAX) 70 MG tablet Take 1 tablet (70 mg total) by mouth every 7 (seven) days.  Take with a full glass of water on an empty stomach. 12 tablet 3   aspirin EC 81 MG tablet Take 1 tablet (81 mg total) by mouth daily. Swallow whole. 30 tablet 12   atorvastatin (LIPITOR) 40 MG tablet Take 1 tablet (40 mg total) by mouth daily. 30 tablet 1   AUSTEDO 12 MG TABS Take 2 tablets by mouth 2 (two) times daily.     clopidogrel (PLAVIX) 75 MG tablet Take 1 tablet (75 mg total) by mouth daily. 19 tablet 0   donepezil (ARICEPT) 5 MG tablet TAKE 1 TABLET (5 MG TOTAL) BY MOUTH DAILY. 90 tablet 1   escitalopram (LEXAPRO) 20 MG tablet TAKE 1 TABLET BY MOUTH EVERY DAY 90 tablet 3   losartan (COZAAR) 25 MG tablet Take 2 tablets (50 mg total) by mouth daily. 180 tablet 1   nicotine (NICODERM CQ - DOSED IN MG/24 HOURS) 21 mg/24hr patch Place 1 patch (21 mg total) onto the skin daily. 28 patch 0   scopolamine (TRANSDERM-SCOP) 1 MG/3DAYS Place 1 patch (1.5 mg total) onto the skin every 3 (three) days. 10 patch 11     Home: Home Living Family/patient expects to be discharged to:: Private residence Living Arrangements: Alone Available Help at Discharge: Family, Personal care attendant, Available 24 hours/day Type of Home: House Home Access: Stairs to enter [employer] of Steps: 3 Entrance Stairs-Rails: Can reach both Home Layout: One level Bathroom Shower/Tub: Multimedia programmer: Handicapped height Bathroom Accessibility: Yes Home Equipment: Conservation officer, nature (2 wheels), Wheelchair - manual, Grab bars - tub/shower, Grab bars - toilet, Shower seat, BSC/3in1, Hand held shower head Additional Comments: Pt has a home aid to 7x/wk for ~4 hours per day. She has assist form her daugthers as well, one of whom is an OT. Someone is with her from ~10am-6pm and then 6pm-9pm. Platform RW since wrist fracture. [name] daughter reported that family is planning to increase CG hours to 10AM-6:00PM   Functional History: Prior Function Prior Level of Function : Needs assist Physical Assist :  Mobility (physical), ADLs (physical) ADLs (physical): IADLs, Bathing Mobility Comments: pt using RW with platform on Rt side for recent Rt wrist fracture. Pt should be NWB on Rt wrist. ADLs Comments: pt is able to dress self and has an aide for ~4 hours per day and family there at other times. Pt typically gets herself up in the morning, gets dressed, ambulates to living room or kitchen from bedroom. Waits for aid or family to arrive around 10am. does not eat a whol lot and doesn't fix a breakfast for herself.  Family reports plan to increase PCA hours from 10AM-6PM   Functional Status:  Mobility: Bed Mobility Overal bed mobility: Needs Assistance Bed Mobility: Supine to Sit, Sit to Supine Supine to sit: Supervision, HOB elevated Sit to supine: Supervision General bed mobility comments: increased time Transfers Overall transfer level: Needs assistance Equipment used: Rolling walker (2 wheels), Right platform walker Transfers: Sit to/from Stand Sit to Stand: Min assist General transfer comment: increased time to power up Ambulation/Gait Ambulation/Gait assistance: Min assist Gait Distance (Feet): 100 Feet Assistive device:  Rolling walker (2 wheels), Right platform walker Gait Pattern/deviations: Step-through pattern, Decreased step length - right, Decreased step length - left, Shuffle, Narrow base of support, Ataxic General Gait Details: assist to manage RW and maintain balance. Several start/stops during gait trial. Pt with eyes closed during stops, needing verbal cues to return to task. Gait velocity: decreased   ADL: ADL Overall ADL's : Needs assistance/impaired Eating/Feeding: Set up, Minimal assistance, Sitting Grooming: Standing, Wash/dry hands, Minimal assistance, Cueing for safety Upper Body Bathing: Min guard, Sitting Lower Body Bathing: Minimal assistance, Sitting/lateral leans, Sit to/from stand Upper Body Dressing : Minimal assistance, Sitting Lower Body Dressing:  Minimal assistance Lower Body Dressing Details (indicate cue type and reason): Underwear and pants clothing management for toileting. Min As for both task and balance Toilet Transfer: Minimal assistance, Regular Toilet, Grab bars, Moderate assistance Toilet Transfer Details (indicate cue type and reason): Moderate assist to descend to toilet and to EOB with impaired ececntric control. Min As to ascend from recliner and toilet. Toileting- Clothing Manipulation and Hygiene: Sitting/lateral lean, Min guard Functional mobility during ADLs: Minimal assistance, Rolling walker (2 wheels), Moderate assistance (Platform RW)   Cognition: Cognition Overall Cognitive Status: Impaired/Different from baseline Arousal/Alertness: Awake/alert Orientation Level: Oriented X4 Memory: Impaired Memory Impairment: Decreased recall of new information, Retrieval deficit Awareness: Impaired Awareness Impairment: Emergent impairment Problem Solving: Impaired Problem Solving Impairment: Verbal basic, Verbal complex Cognition Arousal/Alertness: Awake/alert Behavior During Therapy: Flat affect Overall Cognitive Status: Impaired/Different from baseline Area of Impairment: Following commands, Safety/judgement, Awareness, Problem solving Following Commands: Follows one step commands with increased time, Follows one step commands consistently, Follows multi-step commands inconsistently Safety/Judgement: Decreased awareness of safety Awareness: Emergent Problem Solving: Slow processing, Requires verbal cues, Difficulty sequencing  Physical Exam:   Blood pressure (!) 147/77, pulse 64, temperature 98.7 F (37.1 C), temperature source Oral, resp. rate 18, height '5\' 6"'$  (1.676 m), weight 60.8 kg, SpO2 98 %.    General: No apparent distress HEENT: Head is normocephalic, atraumatic, PERRLA, EOMI, sclera anicteric, oral mucosa pink and moist, dentition intact, ext ear canals clear, white coating on tongue Neck: Supple  without JVD or lymphadenopathy Heart: Reg rate and rhythm. No murmurs rubs or gallops Chest: CTA bilaterally without wheezes, rales, or rhonchi; no distress Abdomen: Soft, non-tender, non-distended, bowel sounds positive. Extremities: No clubbing, cyanosis, or edema. Pulses are 2+ Psych: [name] affect is appropriate. Pt is cooperative Skin: Clean and intact without signs of breakdown Neuro:   Dysarthric but intelligible.  Alert and oriented x4, Follows simple commands.  Finger to nose slightly abnormal LUE . CN 2-12 intact  Strength 5/5 in LUE and LLE Strength 5/5 LLE Strength 4+/5 R shoulder abduction, able to move fingers RUE Fidgeting movements in her limbs Sensation intact to LT all 4 extremities Musculoskeletal: No joint swelling noted Right arm and forearm in cast    No results found for this or any previous visit (from the past 48 hour(s)).  ECHOCARDIOGRAM COMPLETE  Result Date: 10/03/2022    ECHOCARDIOGRAM REPORT   Patient Name:   Barbara Krause Date of Exam: 10/03/2022 Medical Rec #:  [id number]     Height:       66.0 in Accession #:    [id number]    Weight:       134.0 lb Date of Birth:  December 28, 1940     BSA:          1.687 m Patient Age:    19 years      BP:  158/111 mmHg Patient Gender: F             HR:           69 bpm. Exam Location:  Inpatient Procedure: 2D Echo, Color Doppler and Cardiac Doppler Indications:    Stroke i63.9  History:        Patient has no prior history of Echocardiogram examinations.                 Risk Factors:Hypertension and Dyslipidemia.  Sonographer:    Raquel Sarna Senior RDCS Referring Phys: [id number] Orma Flaming  Sonographer Comments: No visible apical window due to mastectomy and implant location. IMPRESSIONS  1. Left ventricular ejection fraction, by estimation, is 60 to 65%. The left ventricle has normal function. The left ventricle has no regional wall motion abnormalities. Left ventricular diastolic function could not be evaluated.  2. Right  ventricular systolic function is normal. The right ventricular size is normal. There is mildly elevated pulmonary artery systolic pressure. The estimated right ventricular systolic pressure is 02.4 mmHg.  3. The mitral valve is normal in structure. No evidence of mitral valve regurgitation. No evidence of mitral stenosis.  4. Tricuspid valve regurgitation is mild to moderate.  5. The aortic valve is tricuspid. Aortic valve regurgitation is not visualized. No aortic stenosis is present.  6. Aortic dilatation noted. There is mild dilatation of the ascending aorta, measuring 44 mm.  7. The inferior vena cava is normal in size with greater than 50% respiratory variability, suggesting right atrial pressure of 3 mmHg. Conclusion(s)/Recommendation(s): No intracardiac source of embolism detected on this transthoracic study. Consider a transesophageal echocardiogram to exclude cardiac source of embolism if clinically indicated. FINDINGS  Left Ventricle: Left ventricular ejection fraction, by estimation, is 60 to 65%. The left ventricle has normal function. The left ventricle has no regional wall motion abnormalities. The left ventricular internal cavity size was normal in size. There is  no left ventricular hypertrophy. Left ventricular diastolic function could not be evaluated. Right Ventricle: The right ventricular size is normal. No increase in right ventricular wall thickness. Right ventricular systolic function is normal. There is mildly elevated pulmonary artery systolic pressure. The tricuspid regurgitant velocity is 2.76  m/s, and with an assumed right atrial pressure of 8 mmHg, the estimated right ventricular systolic pressure is 09.7 mmHg. Left Atrium: Left atrial size was normal in size. Right Atrium: Right atrial size was normal in size. Pericardium: There is no evidence of pericardial effusion. Mitral Valve: The mitral valve is normal in structure. No evidence of mitral valve regurgitation. No evidence of mitral  valve stenosis. Tricuspid Valve: The tricuspid valve is normal in structure. Tricuspid valve regurgitation is mild to moderate. No evidence of tricuspid stenosis. Aortic Valve: The aortic valve is tricuspid. Aortic valve regurgitation is not visualized. No aortic stenosis is present. Pulmonic Valve: The pulmonic valve was normal in structure. Pulmonic valve regurgitation is trivial. No evidence of pulmonic stenosis. Aorta: Aortic dilatation noted. There is mild dilatation of the ascending aorta, measuring 44 mm. Venous: The inferior vena cava is normal in size with greater than 50% respiratory variability, suggesting right atrial pressure of 3 mmHg. IAS/Shunts: No atrial level shunt detected by color flow Doppler.  LEFT VENTRICLE PLAX 2D LVIDd:         4.10 cm LVIDs:         2.60 cm LV PW:         0.90 cm LV IVS:  0.80 cm LVOT diam:     1.90 cm LV SV:         56 LV SV Index:   33 LVOT Area:     2.84 cm  LEFT ATRIUM         Index LA diam:    3.20 cm 1.90 cm/m  AORTIC VALVE LVOT Vmax:   94.00 cm/s LVOT Vmean:  69.700 cm/s LVOT VTI:    0.198 m  AORTA Ao Root diam: 2.95 cm Ao Asc diam:  4.35 cm TRICUSPID VALVE TR Peak grad:   30.5 mmHg TR Vmax:        276.00 cm/s  SHUNTS Systemic VTI:  0.20 m Systemic Diam: 1.90 cm Candee Furbish MD Electronically signed by Candee Furbish MD Signature Date/Time: 10/03/2022/9:26:39 AM    Final        Blood pressure (!) 147/77, pulse 64, temperature 98.7 F (37.1 C), temperature source Oral, resp. rate 18, height '5\' 6"'$  (1.676 m), weight 60.8 kg, SpO2 98 %.     Medical Problem List and Plan: 1. Functional deficits secondary to right cerebellar hemispheric punctate focus of acute ischemia  -patient may  shower  -ELOS/Goals: 7-10 days PT/OT Sup, SLP Mod I   -Admit to CIR 2.  Antithrombotics: -DVT/anticoagulation:  Pharmaceutical: Lovenox  -antiplatelet therapy: Aspirin 81 mg daily and Plavix 75 mg daily x 3 weeks then aspirin alone 3. Pain Management: Tylenol as needed 4.  Mood/Behavior/Sleep: Aricept 5 mg daily, Lexapro 20 mg daily  -antipsychotic agents: N/A 5. Neuropsych/cognition: This patient is capable of making decisions on her own behalf. 6. Skin/Wound Care: Routine skin checks 7. Fluids/Electrolytes/Nutrition: Routine in and outs with follow-up chemistries 8.  Hypertension.  Cozaar 50 mg daily.  Monitor with increased mobility. Has been well controlled 9.  Hyperlipidemia.  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  Followed at [facility].  Continue Deutetrabenazine. Discussed with daughter, she will bring her medication in from home 68.  Tobacco use.  NicoDerm patch.  Provide counseling 12.  Incidental findings of right thyroid nodule as well as 1.6 cm left upper lobe groundglass opacity.  Follow-up outpatient. 13. Dysphagia, Continue Dys 3 diet, SLP eval 14. [place], On aricept 15. Cough, recheck CXR 16. Right wrist distal radius fracture. Followed by [facility]. Non weight-bearing on the right wrist  17 Thrush. Order nystatin swish and swallow ordered  Cathlyn Parsons, PA-C 10/04/2022  I have personally performed a face to face diagnostic evaluation of this patient and formulated the key components of the plan.  Additionally, I have personally reviewed laboratory data, imaging studies, as well as relevant notes and concur with the physician assistant's documentation above.  The patient's status has not changed from the original H&P.  Any changes in documentation from the acute care chart have been noted above.  Jennye Boroughs, MD, [facility] Drown

## 2022-10-04 NOTE — H&P (Incomplete)
Physical Medicine and Rehabilitation Admission H&P    Chief Complaint  Patient presents with   Aphasia  : HPI: Barbara Krause is a 81 year old right-handed female with history significant for hypertension, breast cancer status post bilateral mastectomy 2009, memory deficit is maintained on Aricept, Huntington's disease followed at [facility], osteoporosis, history of tobacco use.  Per chart review patient lives alone.  1 level home 3 steps to entry.  She has a home health aide 7 times a week for 4 hours/day.  She also has assistance from her daughters.  Patient uses a platform rolling walker for mobility.  Family plans to increase PCA hours as needed.  Presented 10/01/2022  slurred speech.  Noted recent fall when she struck her head no loss of consciousness and family noted increased somnolence over the past week.  CT/MRI showed punctate focus of acute ischemia in the right cerebellar hemisphere.  No hemorrhage or mass effect.  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  CT cervical spine negative for fracture or malalignment.  Noted right thyroid nodule measuring 1.5 cm recommend outpatient follow-up.  Admission chemistries unremarkable, urinalysis negative.  Echocardiogram with ejection fraction of 60 to 65% no wall motion abnormalities.  CT angiogram head and neck no emergent large vessel occlusion or high-grade stenosis.  Noted findings of groundglass opacity left upper lobe measuring 1.6 cm recommend outpatient follow-up.  Neurology follow-up placed on aspirin and Plavix x 21 days then aspirin alone.  Lovenox added for DVT prophylaxis.  Tolerating a mechanical soft diet.  Therapy evaluations completed due to patient decreased functional mobility and dysarthria was admitted for a comprehensive rehab program.  Review of Systems  Constitutional:  Negative for chills and fever.  HENT:  Negative for hearing loss.   Eyes:  Negative for blurred vision and double  vision.  Respiratory:  Negative for cough, shortness of breath and wheezing.   Cardiovascular:  Negative for chest pain, palpitations and leg swelling.  Gastrointestinal:  Positive for constipation.  Genitourinary:  Negative for dysuria, flank pain and hematuria.  Musculoskeletal:  Positive for falls, joint pain and myalgias.  Skin:  Negative for rash.  Neurological:  Positive for tremors, speech change and weakness.  Psychiatric/Behavioral:  Positive for depression and memory loss. The patient has insomnia.   All other systems reviewed and are negative.  Past Medical History:  Diagnosis Date   Breast cancer ([place]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   Depression    History of chicken pox    History of colon polyps    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[place])    Kidney stone    Osteoporosis    Past Surgical History:  Procedure Laterality Date   CESAREAN SECTION     1977 and 1979   CHOLECYSTECTOMY     TOTAL MASTECTOMY Bilateral 2009   Family History  Problem Relation Age of Onset   Colon cancer Mother    Stroke Father    Heart disease Father    Ovarian cancer Sister    Healthy Daughter    Stomach cancer Maternal Grandmother    Diabetes Maternal Grandfather    Colon cancer Paternal Grandmother    Diabetes Paternal Grandfather    Healthy Daughter    Social History:  reports that she has been smoking cigarettes. She has a 37.50 pack-year smoking history. She has never used smokeless tobacco. She reports current alcohol use. She reports that she does not use drugs. Allergies:  Allergies  Allergen Reactions  Penicillin G Hives   Bactrim [Sulfamethoxazole-Trimethoprim] Rash   Medications Prior to Admission  Medication Sig Dispense Refill   alendronate (FOSAMAX) 70 MG tablet Take 1 tablet (70 mg total) by mouth every 7 (seven) days. Take with a full glass of water on an empty stomach. 12 tablet 3   atorvastatin (LIPITOR) 10 MG tablet TAKE 1 TABLET BY MOUTH EVERY DAY 90 tablet 3   AUSTEDO 12 MG TABS Take 2  tablets by mouth 2 (two) times daily.     donepezil (ARICEPT) 5 MG tablet TAKE 1 TABLET (5 MG TOTAL) BY MOUTH DAILY. 90 tablet 1   escitalopram (LEXAPRO) 20 MG tablet TAKE 1 TABLET BY MOUTH EVERY DAY 90 tablet 3   losartan (COZAAR) 25 MG tablet Take 2 tablets (50 mg total) by mouth daily. 180 tablet 1   scopolamine (TRANSDERM-SCOP) 1 MG/3DAYS Place 1 patch (1.5 mg total) onto the skin every 3 (three) days. 10 patch 11      Home: Home Living Family/patient expects to be discharged to:: Private residence Living Arrangements: Alone Available Help at Discharge: Family, Personal care attendant, Available 24 hours/day Type of Home: House Home Access: Stairs to enter [employer] of Steps: 3 Entrance Stairs-Rails: Can reach both Home Layout: One level Bathroom Shower/Tub: Multimedia programmer: Handicapped height Bathroom Accessibility: Yes Home Equipment: Conservation officer, nature (2 wheels), Wheelchair - manual, Grab bars - tub/shower, Grab bars - toilet, Shower seat, BSC/3in1, Hand held shower head Additional Comments: Pt has a home aid to 7x/wk for ~4 hours per day. She has assist form her daugthers as well, one of whom is an OT. Someone is with her from ~10am-6pm and then 6pm-9pm. Platform RW since wrist fracture. [name] daughter reported that family is planning to increase CG hours to 10AM-6:00PM   Functional History: Prior Function Prior Level of Function : Needs assist Physical Assist : Mobility (physical), ADLs (physical) ADLs (physical): IADLs, Bathing Mobility Comments: pt using RW with platform on Rt side for recent Rt wrist fracture. Pt should be NWB on Rt wrist. ADLs Comments: pt is able to dress self and has an aide for ~4 hours per day and family there at other times. Pt typically gets herself up in the morning, gets dressed, ambulates to living room or kitchen from bedroom. Waits for aid or family to arrive around 10am. does not eat a whol lot and doesn't fix a  breakfast for herself.  Family reports plan to increase PCA hours from 10AM-6PM  Functional Status:  Mobility: Bed Mobility Overal bed mobility: Needs Assistance Bed Mobility: Supine to Sit, Sit to Supine Supine to sit: Supervision, HOB elevated Sit to supine: Supervision General bed mobility comments: increased time Transfers Overall transfer level: Needs assistance Equipment used: Rolling walker (2 wheels), Right platform walker Transfers: Sit to/from Stand Sit to Stand: Min assist General transfer comment: increased time to power up Ambulation/Gait Ambulation/Gait assistance: Min assist Gait Distance (Feet): 100 Feet Assistive device: Rolling walker (2 wheels), Right platform walker Gait Pattern/deviations: Step-through pattern, Decreased step length - right, Decreased step length - left, Shuffle, Narrow base of support, Ataxic General Gait Details: assist to manage RW and maintain balance. Several start/stops during gait trial. Pt with eyes closed during stops, needing verbal cues to return to task. Gait velocity: decreased    ADL: ADL Overall ADL's : Needs assistance/impaired Eating/Feeding: Set up, Minimal assistance, Sitting Grooming: Standing, Wash/dry hands, Minimal assistance, Cueing for safety Upper Body Bathing: Min guard, Sitting Lower Body Bathing: Minimal  assistance, Sitting/lateral leans, Sit to/from stand Upper Body Dressing : Minimal assistance, Sitting Lower Body Dressing: Minimal assistance Lower Body Dressing Details (indicate cue type and reason): Underwear and pants clothing management for toileting. Min As for both task and balance Toilet Transfer: Minimal assistance, Regular Toilet, Grab bars, Moderate assistance Toilet Transfer Details (indicate cue type and reason): Moderate assist to descend to toilet and to EOB with impaired ececntric control. Min As to ascend from recliner and toilet. Toileting- Clothing Manipulation and Hygiene: Sitting/lateral  lean, Min guard Functional mobility during ADLs: Minimal assistance, Rolling walker (2 wheels), Moderate assistance (Platform RW)  Cognition: Cognition Overall Cognitive Status: Impaired/Different from baseline Arousal/Alertness: Awake/alert Orientation Level: Oriented X4 Memory: Impaired Memory Impairment: Decreased recall of new information, Retrieval deficit Awareness: Impaired Awareness Impairment: Emergent impairment Problem Solving: Impaired Problem Solving Impairment: Verbal basic, Verbal complex Cognition Arousal/Alertness: Awake/alert Behavior During Therapy: Flat affect Overall Cognitive Status: Impaired/Different from baseline Area of Impairment: Following commands, Safety/judgement, Awareness, Problem solving Following Commands: Follows one step commands with increased time, Follows one step commands consistently, Follows multi-step commands inconsistently Safety/Judgement: Decreased awareness of safety Awareness: Emergent Problem Solving: Slow processing, Requires verbal cues, Difficulty sequencing  Physical Exam: Blood pressure (!) 147/77, pulse 64, temperature 98.7 F (37.1 C), temperature source Oral, resp. rate 18, height '5\' 6"'$  (1.676 m), weight 60.8 kg, SpO2 98 %. Physical Exam Neurological:     Comments: Patient is alert.  Dysarthric but intelligible.  Provides name and age but limited to date and year.  Follows simple commands.     Results for orders placed or performed during the hospital encounter of 10/01/22 (from the past 48 hour(s))  Glucose, capillary     Status: Abnormal   Collection Time: 10/02/22 12:57 PM  Result Value Ref Range   Glucose-Capillary 110 (H) 70 - 99 mg/dL    Comment: Glucose reference range applies only to samples taken after fasting for at least 8 hours.   Comment 1 Notify RN    Comment 2 Document in Chart    ECHOCARDIOGRAM COMPLETE  Result Date: 10/03/2022    ECHOCARDIOGRAM REPORT   Patient Name:   Barbara Krause Date of Exam:  10/03/2022 Medical Rec #:  [id number]     Height:       66.0 in Accession #:    [id number]    Weight:       134.0 lb Date of Birth:  February 26, 1941     BSA:          1.687 m Patient Age:    8 years      BP:           158/111 mmHg Patient Gender: F             HR:           69 bpm. Exam Location:  Inpatient Procedure: 2D Echo, Color Doppler and Cardiac Doppler Indications:    Stroke i63.9  History:        Patient has no prior history of Echocardiogram examinations.                 Risk Factors:Hypertension and Dyslipidemia.  Sonographer:    Raquel Sarna Senior RDCS Referring Phys: [id number] Orma Flaming  Sonographer Comments: No visible apical window due to mastectomy and implant location. IMPRESSIONS  1. Left ventricular ejection fraction, by estimation, is 60 to 65%. The left ventricle has normal function. The left ventricle has no regional wall motion abnormalities. Left ventricular diastolic function could not  be evaluated.  2. Right ventricular systolic function is normal. The right ventricular size is normal. There is mildly elevated pulmonary artery systolic pressure. The estimated right ventricular systolic pressure is 19.3 mmHg.  3. The mitral valve is normal in structure. No evidence of mitral valve regurgitation. No evidence of mitral stenosis.  4. Tricuspid valve regurgitation is mild to moderate.  5. The aortic valve is tricuspid. Aortic valve regurgitation is not visualized. No aortic stenosis is present.  6. Aortic dilatation noted. There is mild dilatation of the ascending aorta, measuring 44 mm.  7. The inferior vena cava is normal in size with greater than 50% respiratory variability, suggesting right atrial pressure of 3 mmHg. Conclusion(s)/Recommendation(s): No intracardiac source of embolism detected on this transthoracic study. Consider a transesophageal echocardiogram to exclude cardiac source of embolism if clinically indicated. FINDINGS  Left Ventricle: Left ventricular ejection fraction, by estimation,  is 60 to 65%. The left ventricle has normal function. The left ventricle has no regional wall motion abnormalities. The left ventricular internal cavity size was normal in size. There is  no left ventricular hypertrophy. Left ventricular diastolic function could not be evaluated. Right Ventricle: The right ventricular size is normal. No increase in right ventricular wall thickness. Right ventricular systolic function is normal. There is mildly elevated pulmonary artery systolic pressure. The tricuspid regurgitant velocity is 2.76  m/s, and with an assumed right atrial pressure of 8 mmHg, the estimated right ventricular systolic pressure is 79.0 mmHg. Left Atrium: Left atrial size was normal in size. Right Atrium: Right atrial size was normal in size. Pericardium: There is no evidence of pericardial effusion. Mitral Valve: The mitral valve is normal in structure. No evidence of mitral valve regurgitation. No evidence of mitral valve stenosis. Tricuspid Valve: The tricuspid valve is normal in structure. Tricuspid valve regurgitation is mild to moderate. No evidence of tricuspid stenosis. Aortic Valve: The aortic valve is tricuspid. Aortic valve regurgitation is not visualized. No aortic stenosis is present. Pulmonic Valve: The pulmonic valve was normal in structure. Pulmonic valve regurgitation is trivial. No evidence of pulmonic stenosis. Aorta: Aortic dilatation noted. There is mild dilatation of the ascending aorta, measuring 44 mm. Venous: The inferior vena cava is normal in size with greater than 50% respiratory variability, suggesting right atrial pressure of 3 mmHg. IAS/Shunts: No atrial level shunt detected by color flow Doppler.  LEFT VENTRICLE PLAX 2D LVIDd:         4.10 cm LVIDs:         2.60 cm LV PW:         0.90 cm LV IVS:        0.80 cm LVOT diam:     1.90 cm LV SV:         56 LV SV Index:   33 LVOT Area:     2.84 cm  LEFT ATRIUM         Index LA diam:    3.20 cm 1.90 cm/m  AORTIC VALVE LVOT Vmax:    94.00 cm/s LVOT Vmean:  69.700 cm/s LVOT VTI:    0.198 m  AORTA Ao Root diam: 2.95 cm Ao Asc diam:  4.35 cm TRICUSPID VALVE TR Peak grad:   30.5 mmHg TR Vmax:        276.00 cm/s  SHUNTS Systemic VTI:  0.20 m Systemic Diam: 1.90 cm Candee Furbish MD Electronically signed by Candee Furbish MD Signature Date/Time: 10/03/2022/9:26:39 AM    Final       Blood  pressure (!) 147/77, pulse 64, temperature 98.7 F (37.1 C), temperature source Oral, resp. rate 18, height '5\' 6"'$  (1.676 m), weight 60.8 kg, SpO2 98 %.  Medical Problem List and Plan: 1. Functional deficits secondary to right cerebellar hemispheric punctate focus of acute ischemia  -patient may *** shower  -ELOS/Goals: *** 2.  Antithrombotics: -DVT/anticoagulation:  Pharmaceutical: Lovenox  -antiplatelet therapy: Aspirin 81 mg daily and Plavix 75 mg daily x 3 weeks then aspirin alone 3. Pain Management: Tylenol as needed 4. Mood/Behavior/Sleep: Aricept 5 mg daily, Lexapro 20 mg daily  -antipsychotic agents: N/A 5. Neuropsych/cognition: This patient is capable of making decisions on her own behalf. 6. Skin/Wound Care: Routine skin checks 7. Fluids/Electrolytes/Nutrition: Routine in and outs with follow-up chemistries 8.  Hypertension.  Cozaar 50 mg daily.  Monitor with increased mobility 9.  Hyperlipidemia.  Lipitor 10.  Huntington's disease.  [facility] at [facility].  Continue Deutetrabenazine 11.  Tobacco use.  NicoDerm patch.  Provide counseling 12.  Incidental findings of right thyroid nodule as well as 1.6 cm left upper lobe groundglass opacity.  Follow-up outpatient.   Lavon Paganini Aaliyah Gavel, PA-C 10/04/2022

## 2022-10-04 NOTE — Discharge Summary (Signed)
Physician Discharge Summary  Barbara Krause PZW:[id number] DOB: 22-Mar-1941 DOA: 10/01/2022  PCP: Jinny Sanders, MD  Admit date: 10/01/2022 Discharge date: 10/04/2022  Admitted From: Home Disposition: Inpatient rehab  Recommendations for Outpatient Follow-up:  Follow up with PCP in 1-2 weeks Follow-up with neurology as scheduled  Discharge Condition: Stable  CODE STATUS: DNR Diet recommendation: Regular diet, dysphagia 3 thin liquids, remains high risk per speech but family understands risk factors  Brief/Interim Summary: Barbara Krause is a 81 y.o. female with medical history significant of breast cancer, Huntington's disease, depression who presented to ED with a few day history of trouble ambulating and dysarthria.  Daughter is present and states she fell 10 days ago. That was unusual for her. She started to drool and her eyes were closing and she was getting more lethargic. Since that time her balance has been more off and speech slurred more than usual per daughter. Admitted for stroke workup by [facility], neuro consulted.     Patient mid as above with acute metabolic encephalopathy of unclear etiology, she did have an acute CVA in the cerebellum as noted on MRI, neurology involved recommending 21-day Plavix with ongoing aspirin and increase statin.  Unclear if patient's stroke, given its location, is related to patient's symptoms, she does have a known diagnosis of Huntington's disease which appears to be more consistent with her symptoms per neurology.  Regardless she continues to be having worsening difficulty with ambulation from baseline and has been recommended for inpatient rehab.  She is otherwise stable and agreeable for discharge as above, medication changes as below.  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   CVA (cerebral vascular accident) ([place])  Acute CVA (cerebrovascular accident) ([place]) -MRI confirms small punctate acute ischemic right cerebellar hemisphere stroke, likely unrelated to patient's symptoms as below -Neurology consulted, appreciate insight and recommendations -Continue aspirin, Plavix for 21 days then aspirin alone, atorvastatin 40 daily   Dysphagia Known history of dysphagia with risk for aspiration with her Huntington's disease Has had swallow studies, followed at [facility] Understands risks, still eats regular food cut up in small pieces    HTN (hypertension) Continue home medications, currently well-controlled   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[place]) Followed by Rob Hickman Continue austedo  Fall and aspiration precautions ongoing PT/OT following, transfer to inpatient rehab   Tobacco use Quit 2 weeks ago Continue nicotine patch    High cholesterol Lipid panel unremarkable, continue increased dose of statin given CVA as above   Depression, major, single episode, complete remission ([place]) Continue lexapro    Dementia ([place]) Appears mild Continue aricept daily  Delirium precautions    Right thyroid nodule Measuring upto 1.5cm Recommend outpatient thyroid US if patient wishes    Incidental left upper lobe groundglass opacity  -1.6 cm, will need 66-monthfollow-up to ensure clearance with noncontrast chest CT per recommendations from radiology -Patient without respiratory symptoms, no indication for further imaging at this time   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 Take with a full glass of water on an empty stomach.   aspirin EC 81 MG tablet Take 1 tablet (81 mg total) by mouth daily. Swallow whole.   atorvastatin 40 MG tablet Commonly known as: LIPITOR Take 1 tablet (40 mg total) by mouth daily. What changed:  medication strength how much to take   Austedo 12 MG Tabs Generic drug: Deutetrabenazine Take 2 tablets by mouth 2 (two) times daily.   clopidogrel 75 MG tablet Commonly known as: PLAVIX Take 1 tablet (75 mg total) by mouth daily.   donepezil 5 MG tablet Commonly known as: ARICEPT TAKE 1 TABLET (5 MG TOTAL) BY MOUTH DAILY.   escitalopram 20 MG tablet Commonly known as: LEXAPRO TAKE 1 TABLET BY MOUTH EVERY DAY   losartan 25 MG tablet Commonly known as: COZAAR Take 2 tablets (50 mg total) by mouth daily.   nicotine 21 mg/24hr patch Commonly known as: NICODERM CQ - dosed in mg/24 hours Place 1 patch (21 mg total) onto the skin daily.   scopolamine 1 MG/3DAYS Commonly known as: TRANSDERM-SCOP Place 1 patch (1.5 mg total) onto the skin every 3 (three) days.        Allergies  Allergen Reactions   Penicillin G Hives   Bactrim [Sulfamethoxazole-Trimethoprim] Rash    Consultations: Neurology  Procedures/Studies: ECHOCARDIOGRAM COMPLETE  Result Date: 10/03/2022    ECHOCARDIOGRAM REPORT   Patient Name:   Barbara Krause Date of Exam: 10/03/2022 Medical Rec #:  [id number]     Height:       66.0 in Accession #:    [id number]    Weight:       134.0 lb Date of Birth:  29-Aug-1941     BSA:          1.687 m Patient Age:    85 years      BP:           158/111 mmHg Patient Gender: F             HR:           69 bpm. Exam Location:  Inpatient Procedure: 2D Echo, Color Doppler and Cardiac Doppler Indications:    Stroke i63.9  History:        Patient has no prior history of Echocardiogram examinations.                 Risk Factors:Hypertension and Dyslipidemia.   Sonographer:    Raquel Sarna Senior RDCS Referring Phys: [id number] Orma Flaming  Sonographer Comments: No visible apical window due to mastectomy and implant location. IMPRESSIONS  1. Left ventricular ejection fraction, by estimation, is 60 to 65%. The left ventricle has normal function. The left ventricle has no regional wall motion abnormalities. Left ventricular diastolic function could not be evaluated.  2. Right ventricular systolic function is normal. The right ventricular size is normal. There is mildly elevated pulmonary artery systolic pressure. The estimated right ventricular systolic pressure is 62.2 mmHg.  3. The mitral valve is normal in structure. No evidence of mitral valve regurgitation. No evidence of mitral stenosis.  4. Tricuspid valve regurgitation is mild to moderate.  5. The aortic valve is tricuspid. Aortic valve regurgitation is not visualized. No aortic stenosis is present.  6. Aortic dilatation noted. There is mild dilatation of the ascending aorta,  measuring 44 mm.  7. The inferior vena cava is normal in size with greater than 50% respiratory variability, suggesting right atrial pressure of 3 mmHg. Conclusion(s)/Recommendation(s): No intracardiac source of embolism detected on this transthoracic study. Consider a transesophageal echocardiogram to exclude cardiac source of embolism if clinically indicated. FINDINGS  Left Ventricle: Left ventricular ejection fraction, by estimation, is 60 to 65%. The left ventricle has normal function. The left ventricle has no regional wall motion abnormalities. The left ventricular internal cavity size was normal in size. There is  no left ventricular hypertrophy. Left ventricular diastolic function could not be evaluated. Right Ventricle: The right ventricular size is normal. No increase in right ventricular wall thickness. Right ventricular systolic function is normal. There is mildly elevated pulmonary artery systolic pressure. The tricuspid regurgitant  velocity is 2.76  m/s, and with an assumed right atrial pressure of 8 mmHg, the estimated right ventricular systolic pressure is 06.2 mmHg. Left Atrium: Left atrial size was normal in size. Right Atrium: Right atrial size was normal in size. Pericardium: There is no evidence of pericardial effusion. Mitral Valve: The mitral valve is normal in structure. No evidence of mitral valve regurgitation. No evidence of mitral valve stenosis. Tricuspid Valve: The tricuspid valve is normal in structure. Tricuspid valve regurgitation is mild to moderate. No evidence of tricuspid stenosis. Aortic Valve: The aortic valve is tricuspid. Aortic valve regurgitation is not visualized. No aortic stenosis is present. Pulmonic Valve: The pulmonic valve was normal in structure. Pulmonic valve regurgitation is trivial. No evidence of pulmonic stenosis. Aorta: Aortic dilatation noted. There is mild dilatation of the ascending aorta, measuring 44 mm. Venous: The inferior vena cava is normal in size with greater than 50% respiratory variability, suggesting right atrial pressure of 3 mmHg. IAS/Shunts: No atrial level shunt detected by color flow Doppler.  LEFT VENTRICLE PLAX 2D LVIDd:         4.10 cm LVIDs:         2.60 cm LV PW:         0.90 cm LV IVS:        0.80 cm LVOT diam:     1.90 cm LV SV:         56 LV SV Index:   33 LVOT Area:     2.84 cm  LEFT ATRIUM         Index LA diam:    3.20 cm 1.90 cm/m  AORTIC VALVE LVOT Vmax:   94.00 cm/s LVOT Vmean:  69.700 cm/s LVOT VTI:    0.198 m  AORTA Ao Root diam: 2.95 cm Ao Asc diam:  4.35 cm TRICUSPID VALVE TR Peak grad:   30.5 mmHg TR Vmax:        276.00 cm/s  SHUNTS Systemic VTI:  0.20 m Systemic Diam: 1.90 cm Candee Furbish MD Electronically signed by Candee Furbish MD Signature Date/Time: 10/03/2022/9:26:39 AM    Final    CT ANGIO HEAD NECK W WO CM  Result Date: 10/02/2022 CLINICAL DATA:  Acute neurologic deficit EXAM: CT ANGIOGRAPHY HEAD AND NECK TECHNIQUE: Multidetector CT imaging of the head  and neck was performed using the standard protocol during bolus administration of intravenous contrast. Multiplanar CT image reconstructions and MIPs were obtained to evaluate the vascular anatomy. Carotid stenosis measurements (when applicable) are obtained utilizing NASCET criteria, using the distal internal carotid diameter as the denominator. RADIATION DOSE REDUCTION: This exam was performed according to the departmental dose-optimization program which includes automated exposure control, adjustment of the  mA and/or kV according to patient size and/or use of iterative reconstruction technique. CONTRAST:  19m OMNIPAQUE IOHEXOL 350 MG/ML SOLN COMPARISON:  None Available. FINDINGS: CT HEAD FINDINGS Brain: There is no mass, hemorrhage or extra-axial collection. There is generalized atrophy without lobar predilection. Old left cerebellar infarct There is hypoattenuation of the periventricular white matter, most commonly indicating chronic ischemic microangiopathy. Skull: The visualized skull base, calvarium and extracranial soft tissues are normal. Sinuses/Orbits: No fluid levels or advanced mucosal thickening of the visualized paranasal sinuses. No mastoid or middle ear effusion. The orbits are normal. CTA NECK FINDINGS SKELETON: There is no bony spinal canal stenosis. No lytic or blastic lesion. OTHER NECK: Normal pharynx, larynx and major salivary glands. No cervical lymphadenopathy. Unremarkable thyroid gland. UPPER CHEST: Focal heterogeneous ground-glass opacity in the left upper lobe measuring 1.6 cm. AORTIC ARCH: There is calcific atherosclerosis of the aortic arch. There is no aneurysm, dissection or hemodynamically significant stenosis of the visualized portion of the aorta. Conventional 3 vessel aortic branching pattern. The visualized proximal subclavian arteries are widely patent. RIGHT CAROTID SYSTEM: Normal without aneurysm, dissection or stenosis. LEFT CAROTID SYSTEM: Normal without aneurysm,  dissection or stenosis. VERTEBRAL ARTERIES: Left dominant configuration. Severe narrowing of the right vertebral artery origin. Multifocal moderate narrowing of the right V2 and V3 segments. There is severe stenosis of the left vertebral artery origin, but the remainder is normal. CTA HEAD FINDINGS POSTERIOR CIRCULATION: --Vertebral arteries: Normal V4 segments. --Inferior cerebellar arteries: Normal. --Basilar artery: Normal. --Superior cerebellar arteries: Normal. --Posterior cerebral arteries (PCA): Normal. ANTERIOR CIRCULATION: --Intracranial internal carotid arteries: Normal. --Anterior cerebral arteries (ACA): Normal. Both A1 segments are present. Patent anterior communicating artery (a-comm). --Middle cerebral arteries (MCA): Normal. VENOUS SINUSES: As permitted by contrast timing, patent. ANATOMIC VARIANTS: None Review of the MIP images confirms the above findings. IMPRESSION: 1. No emergent large vessel occlusion or high-grade stenosis of the intracranial arteries. 2. 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 Multifocal moderate narrowing of the right V2 and V3 segments. 3. Focal heterogeneous ground-glass opacity in the left upper lobe measuring 1.6 cm. This could be infectious or neoplastic. Per Fleischner Society Guidelines, recommend a non-contrast Chest CT at 6 months to confirm persistence, then additional non-contrast Chest CTs every 2 years until 5 years. If nodule grows or develops solid component(s), consider resection. These guidelines do not apply to immunocompromised patients and patients with cancer. Follow up in patients with significant comorbidities as clinically warranted. For lung cancer screening, adhere to Lung-RADS guidelines. Reference: Radiology. 2017; 284(1):228-43. Aortic atherosclerosis (ICD10-I70.0). Electronically Signed   By: KUlyses JarredM.D.   On: 10/02/2022 01:43   MR BRAIN WO CONTRAST  Result Date: 10/01/2022 CLINICAL DATA:  Slurred speech and  difficulty walking EXAM: MRI HEAD WITHOUT CONTRAST TECHNIQUE: Multiplanar, multiecho pulse sequences of the brain and surrounding structures were obtained without intravenous contrast. COMPARISON:  None Available. FINDINGS: Brain: Punctate focus of acute ischemia in the right cerebellar hemisphere. No other acute infarct. No acute or chronic hemorrhage. There is multifocal hyperintense T2-weighted signal within the white matter. Generalized volume loss. Old left cerebellar infarct. The midline structures are normal. Vascular: Major flow voids are preserved. Skull and upper cervical spine: Normal calvarium and skull base. Visualized upper cervical spine and soft tissues are normal. Sinuses/Orbits:No paranasal sinus fluid levels or advanced mucosal thickening. No mastoid or middle ear effusion. Normal orbits. IMPRESSION: 1. Punctate focus of acute ischemia in the right cerebellar hemisphere. No hemorrhage or mass effect. 2. Old left  cerebellar infarct and findings of chronic small vessel ischemia. Electronically Signed   By: Ulyses Jarred M.D.   On: 10/01/2022 21:28   CT Cervical Spine Wo Contrast  Result Date: 10/01/2022 CLINICAL DATA:  Trauma EXAM: CT CERVICAL SPINE WITHOUT CONTRAST TECHNIQUE: Multidetector CT imaging of the cervical spine was performed without intravenous contrast. Multiplanar CT image reconstructions were also generated. RADIATION DOSE REDUCTION: This exam was performed according to the departmental dose-optimization program which includes automated exposure control, adjustment of the mA and/or kV according to patient size and/or use of iterative reconstruction technique. COMPARISON:  CT C Spine 06/15/20 FINDINGS: Alignment: Normal. Skull base and vertebrae: No acute fracture. No primary bone lesion or focal pathologic process. Likely chronic superior endplate height loss at T3. Moderate degenerative changes at C4-C5. Soft tissues and spinal canal: No prevertebral fluid or swelling. No visible  canal hematoma. Disc levels: Moderate spinal canal narrowing at C4-C5 secondary to a disc bulge. There are severe degenerative changes at C1-C2 on the right. Upper chest: Negative. Other: Calcified thyroid nodule on the right measuring up to 8 mm. There is a separate more exophytic nodule along the inferior margin of the right thyroid lobe measuring up to 1.5 cm. IMPRESSION: 1. No acute cervical spine fracture or traumatic malalignment. 2. Moderate spinal canal narrowing at C4-C5 secondary to a disc bulge. 3. Severe degenerative changes at C1-C2 on the right. 4. Right thyroid nodule measuring up to 1.5 cm. Recommend further evaluation with a dedicated thyroid ultrasound, if not previously performed. Electronically Signed   By: Marin Roberts M.D.   On: 10/01/2022 15:39   CT HEAD WO CONTRAST (5MM)  Result Date: 10/01/2022 CLINICAL DATA:  Head trauma. EXAM: CT HEAD WITHOUT CONTRAST TECHNIQUE: Contiguous axial images were obtained from the base of the skull through the vertex without intravenous contrast. RADIATION DOSE REDUCTION: This exam was performed according to the departmental dose-optimization program which includes automated exposure control, adjustment of the mA and/or kV according to patient size and/or use of iterative reconstruction technique. COMPARISON:  CT head 06/15/2020.  MRI brain 06/20/2020. FINDINGS: Brain: No evidence of acute infarction, hemorrhage, hydrocephalus, extra-axial collection or mass lesion/mass effect. Again seen is moderate diffuse atrophy with compensatory dilatation of the ventricular system, unchanged. There is stable mild periventricular white matter hypodensity, likely chronic small vessel ischemic change. There is an old lacunar infarct in the right basal ganglia. There is also small chronic infarct in the left cerebellum, new from prior. Vascular: Atherosclerotic calcifications are present within the cavernous internal carotid arteries. Skull: Normal. Negative for fracture  or focal lesion. Sinuses/Orbits: No acute finding. Other: None. IMPRESSION: 1. No acute intracranial process. 2. Stable moderate diffuse atrophy. 3. Stable mild chronic small vessel ischemic change. 4. Small chronic infarct in the left cerebellum, new from prior. Electronically Signed   By: Ronney Asters M.D.   On: 10/01/2022 15:33   DG Chest 2 View  Result Date: 10/01/2022 CLINICAL DATA:  Fatigue, falls, dysarthria EXAM: CHEST - 2 VIEW COMPARISON:  06/30/2020 FINDINGS: Atherosclerotic calcification of the aortic arch. Old healed right rib fractures. Bilateral breast implants. Heart size within normal limits for AP projection. The lungs appear clear. Bony demineralization noted. Multiple remote thoracic compression fractures. No blunting of the costophrenic angles. IMPRESSION: 1. No acute cardiopulmonary disease is radiographically apparent. 2. Multiple remote thoracic compression fractures. 3. Atherosclerotic calcification of the aortic arch. Electronically Signed   By: Van Clines M.D.   On: 10/01/2022 15:15     Subjective:  No acute issues or events overnight   Discharge Exam: Vitals:   10/04/22 0410 10/04/22 0715  BP: (!) 147/77 137/80  Pulse: 64 60  Resp:  20  Temp: 98.7 F (37.1 C) 98.9 F (37.2 C)  SpO2: 98% 98%   Vitals:   10/03/22 1952 10/03/22 2339 10/04/22 0410 10/04/22 0715  BP: (!) 142/84 (!) 140/91 (!) 147/77 137/80  Pulse: 79 64 64 60  Resp: '18 18  20  '$ Temp: 98.6 F (37 C)  98.7 F (37.1 C) 98.9 F (37.2 C)  TempSrc: Oral Oral Oral Oral  SpO2: 99% 95% 98% 98%  Weight:      Height:        General: Pt is alert, awake, not in acute distress Cardiovascular: RRR, S1/S2 +, no rubs, no gallops Respiratory: CTA bilaterally, no wheezing, no rhonchi Abdominal: Soft, NT, ND, bowel sounds + Extremities: no edema, no cyanosis    The results of significant diagnostics from this hospitalization (including imaging, microbiology, ancillary and laboratory) are listed  below for reference.     Microbiology: No results found for this or any previous visit (from the past 240 hour(s)).   Labs: BNP (last 3 results) No results for input(s): "BNP" in the last 8760 hours. Basic Metabolic Panel: Recent Labs  Lab 10/01/22 1442 10/02/22 0105  NA 143 142  K 3.9 3.8  CL 104 108  CO2 27 26  GLUCOSE 82 104*  BUN 18 14  CREATININE 0.86 0.77  CALCIUM 9.7 8.7*   Liver Function Tests: Recent Labs  Lab 10/01/22 1442  AST 22  ALT 18  ALKPHOS 101  BILITOT 0.7  PROT 5.9*  ALBUMIN 3.0*   No results for input(s): "LIPASE", "AMYLASE" in the last 168 hours. No results for input(s): "AMMONIA" in the last 168 hours. CBC: Recent Labs  Lab 10/01/22 1442  WBC 8.1  NEUTROABS 5.8  HGB 12.3  HCT 37.8  MCV 93.3  PLT 202   Cardiac Enzymes: No results for input(s): "CKTOTAL", "CKMB", "CKMBINDEX", "TROPONINI" in the last 168 hours. BNP: Invalid input(s): "POCBNP" CBG: Recent Labs  Lab 10/02/22 1257  GLUCAP 110*   D-Dimer No results for input(s): "DDIMER" in the last 72 hours. Hgb A1c No results for input(s): "HGBA1C" in the last 72 hours. Lipid Profile Recent Labs    10/02/22 0105  CHOL 133  HDL 59  LDLCALC 57  TRIG 85  CHOLHDL 2.3   Thyroid function studies Recent Labs    10/01/22 1545  TSH 1.632   Anemia work up No results for input(s): "VITAMINB12", "FOLATE", "FERRITIN", "TIBC", "IRON", "RETICCTPCT" in the last 72 hours. Urinalysis    Component Value Date/Time   COLORURINE YELLOW 10/01/2022 0205   APPEARANCEUR HAZY (A) 10/01/2022 0205   APPEARANCEUR Cloudy (A) 04/01/2021 1501   LABSPEC 1.028 10/01/2022 0205   PHURINE 7.0 10/01/2022 0205   GLUCOSEU NEGATIVE 10/01/2022 0205   HGBUR NEGATIVE 10/01/2022 0205   BILIRUBINUR NEGATIVE 10/01/2022 0205   BILIRUBINUR Negative 04/01/2021 1501   KETONESUR NEGATIVE 10/01/2022 0205   PROTEINUR NEGATIVE 10/01/2022 0205   UROBILINOGEN 0.2 06/11/2019 1442   NITRITE NEGATIVE 10/01/2022 0205    LEUKOCYTESUR NEGATIVE 10/01/2022 0205   Sepsis Labs Recent Labs  Lab 10/01/22 1442  WBC 8.1   Microbiology No results found for this or any previous visit (from the past 240 hour(s)).   Time coordinating discharge: Over 30 minutes  SIGNED:   Little Ishikawa, DO [facility] 10/04/2022, 10:17 AM Pager   If 7PM-7AM,  please contact night-coverage [email or web address]

## 2022-10-04 NOTE — Progress Notes (Addendum)
Admission/Assessments complete. Informed patient and daughter on education book provided, therapy evaluation will began tomorrow, informed patient on safety plan ( therapy will provided tomorrow) that will inform nursing of the appropriate way to transfer patient, gait belt usage, call bell, and chair alarm. Suction set up and demonstration provided to patient. MD aware of present productive cough, New orders placed. No abnormalities noted to skin.    Yehuda Mao, LPN

## 2022-10-04 NOTE — Progress Notes (Signed)
Signed     PMR Admission Coordinator Pre-Admission Assessment   Patient: Barbara Krause is an 81 y.o., female MRN: [id number] DOB: Dec 19, 1940 Height: _0  (167.6 cm) Weight: 60.8 kg   Insurance Information HMO:     PPO:      PCP:      IPA:      80/20: yes     OTHER:  PRIMARY: Medicare A & B      Policy#: [id number]      Subscriber: patient CM Name:       Phone#:      Fax#:  Pre-Cert#:       Employer:  Benefits:  Phone #: verified eligibility via [employer] on 10/03/22     Name:  Eff. Date: Part A effective 10/01/08, Part B effective 06/01/12     Deduct: $1,600      Out of Pocket Max: NA      Life Max: NA CIR: 100% coverage      SNF: 100% days 1-20, 80% coverage days 21-100 Outpatient: 80% coverage     Co-Pay: 20% Home Health: 100% coverage      Co-Pay:  DME: 80% coverage     Co-Pay: 20% Providers: pt's choice SECONDARY: BCBS/Federal Emp       Policy#: [id number]     Phone#: [phone]   Financial Counselor:       Phone#:    The "Data Collection Information Summary" for patients in Inpatient Rehabilitation Facilities with attached "Privacy Act [place] Records" was provided and verbally reviewed with: Patient and Family   Emergency Contact Information Contact Information       Name Relation Home Work Mobile    pais,loreen Daughter [phone]        [name] Daughter [phone]               Current Medical History  Patient Admitting Diagnosis: CVA History of Present Illness: Pt is a 81 year old female with medical hx significant for: HTN, Huntington's disease, dementia, depression, breast CA, kidney stones, emphysema. Pt presented to [facility] on 10/01/22 d/t a fall 10 days prior to admission and speech changes and somnolence. CT scan negative for acute abnormalities. 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 CTA head and neck negative for LVO. CTA also revealed ground-glass opacity in left upper lobe measuring 1.6 cm.  Therapy evaluations completed and CIR recommended d/t pt's deficits in functional mobility and speech deficits.  Complete NIHSS TOTAL: 2   Patient's medical record from [facility] has been reviewed by the rehabilitation admission coordinator and physician.   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 No   Family History   family history includes Colon cancer in her mother and paternal grandmother; Diabetes in her maternal grandfather and paternal grandfather; Healthy in her daughter and daughter; Heart disease in her father; Ovarian cancer in her sister; Stomach cancer in her maternal grandmother; Stroke in her father.   Current Medications   Current Facility-Administered Medications:    acetaminophen (TYLENOL) tablet 650 mg, 650 mg, Oral, Q4H  PRN **OR** acetaminophen (TYLENOL) 160 MG/5ML solution 650 mg, 650 mg, Per Tube, Q4H PRN **OR** acetaminophen (TYLENOL) suppository 650 mg, 650 mg, Rectal, Q4H PRN, Orma Flaming, MD   aspirin EC tablet 81 mg, 81 mg, Oral, Daily, Orma Flaming, MD, 81 mg at 10/03/22 1010   atorvastatin (LIPITOR) tablet 40 mg, 40 mg, Oral, Daily, Camelia Phenes, MD   clopidogrel (PLAVIX) tablet 75 mg, 75 mg, Oral, Daily, Camelia Phenes, MD, 75 mg at 10/03/22 1219   Deutetrabenazine TABS 2 tablet, 2 tablet, Oral, BID, Orma Flaming, MD, 2 tablet at 10/03/22 2112   donepezil (ARICEPT) tablet 5 mg, 5 mg, Oral, Daily, Orma Flaming, MD, 5 mg at 10/03/22 0847   enoxaparin (LOVENOX) injection 40 mg, 40 mg, Subcutaneous, Q24H, Orma Flaming, MD, 40 mg at 10/03/22 0846   escitalopram (LEXAPRO) tablet 20 mg, 20 mg, Oral, Daily, Orma Flaming, MD, 20 mg at 10/03/22 0847   losartan (COZAAR) tablet  50 mg, 50 mg, Oral, Daily, Orma Flaming, MD, 50 mg at 10/03/22 0847   nicotine (NICODERM CQ - dosed in mg/24 hours) patch 21 mg, 21 mg, Transdermal, Daily, Orma Flaming, MD, 21 mg at 10/03/22 0847   senna-docusate (Senokot-S) tablet 1 tablet, 1 tablet, Oral, QHS PRN, Orma Flaming, MD   Patients Current Diet:  Diet Order                  DIET DYS 3 Room service appropriate? Yes; Fluid consistency: Thin  Diet effective now                         Precautions / Restrictions Precautions Precautions: Fall Restrictions Weight Bearing Restrictions: Yes RUE Weight Bearing: Weight bear through elbow only Other Position/Activity Restrictions: RT Wrist fracture ~3 weeks ago    Has the patient had 2 or more falls or a fall with injury in the past year? Yes   Prior Activity Level Limited Community (1-2x/wk): medical appointments   Prior Functional Level Self Care: Did the patient need help bathing, dressing, using the toilet or eating? Independent   Indoor Mobility: Did the patient need assistance with walking from room to room (with or without device)? Independent   Stairs: Did the patient need assistance with internal or external stairs (with or without device)? Independent   Functional Cognition: Did the patient need help planning regular tasks such as shopping or remembering to take medications? Independent   Patient Information Are you of Hispanic, Latino/a,or Spanish origin?: A. No, not of Hispanic, Latino/a, or Spanish origin What is your race?: A. White Do you need or want an interpreter to communicate with a doctor or health care staff?: 0. No   Patient's Response To:  Health Literacy and Transportation Is the patient able to respond to health literacy and transportation needs?: Yes Health Literacy - How often do you need to have someone help you when you read instructions, pamphlets, or other written material from your doctor or pharmacy?: Never In the past 12  months, has lack of transportation kept you from medical appointments or from getting medications?: No In the past 12 months, has lack of transportation kept you from meetings, work, or from getting things needed for daily living?: No   Home Assistive Devices / [place] Devices/Equipment: Environmental consultant (specify type) Home Equipment: Conservation officer, nature (2 wheels), Wheelchair - manual, Grab bars - tub/shower, Grab bars - toilet, Shower seat, BSC/3in1, Hand held shower head   Prior Device Use: Indicate devices/aids used  by the patient prior to current illness, exacerbation or injury? None of the above   Current Functional Level Cognition   Arousal/Alertness: Awake/alert Overall Cognitive Status: Impaired/Different from baseline Orientation Level: Oriented X4 Following Commands: Follows one step commands with increased time, Follows one step commands consistently, Follows multi-step commands inconsistently Safety/Judgement: Decreased awareness of safety Memory: Impaired Memory Impairment: Decreased recall of new information, Retrieval deficit Awareness: Impaired Awareness Impairment: Emergent impairment Problem Solving: Impaired Problem Solving Impairment: Verbal basic, Verbal complex    Extremity Assessment (includes Sensation/Coordination)   Upper Extremity Assessment: RUE deficits/detail, LUE deficits/detail RUE Deficits / Details: Cast from forearm to hand due to wrist fracture. Able able to bind RT fingers around platform walker handle. Able to raise RT UE at shoulder to 90* RUE: Unable to fully assess due to immobilization RUE Coordination: decreased fine motor LUE Deficits / Details: Noted intention tremor (H/o Huntington's), and CMC OA LUE Coordination: decreased fine motor  Lower Extremity Assessment: Generalized weakness, RLE deficits/detail, LLE deficits/detail RLE Deficits / Details: generalized 3+/5 for hip flexion, knee ext, and ankle DF/PF. RLE Sensation: WNL RLE  Coordination: decreased gross motor LLE Deficits / Details: generalized 3+/5 for hip flexion, knee ext, and ankle DF/PF. LLE Sensation: WNL LLE Coordination: decreased gross motor     ADLs   Overall ADL's : Needs assistance/impaired Eating/Feeding: Set up, Minimal assistance, Sitting Grooming: Standing, Wash/dry hands, Minimal assistance, Cueing for safety Upper Body Bathing: Min guard, Sitting Lower Body Bathing: Minimal assistance, Sitting/lateral leans, Sit to/from stand Upper Body Dressing : Minimal assistance, Sitting Lower Body Dressing: Minimal assistance Lower Body Dressing Details (indicate cue type and reason): Underwear and pants clothing management for toileting. Min As for both task and balance Toilet Transfer: Minimal assistance, Regular Toilet, Grab bars, Moderate assistance Toilet Transfer Details (indicate cue type and reason): Moderate assist to descend to toilet and to EOB with impaired ececntric control. Min As to ascend from recliner and toilet. Toileting- Clothing Manipulation and Hygiene: Sitting/lateral lean, Min guard Functional mobility during ADLs: Minimal assistance, Rolling walker (2 wheels), Moderate assistance (Platform RW)     Mobility   Overal bed mobility: Needs Assistance Bed Mobility: Supine to Sit, Sit to Supine Supine to sit: Supervision, HOB elevated Sit to supine: Supervision General bed mobility comments: increased time     Transfers   Overall transfer level: Needs assistance Equipment used: Rolling walker (2 wheels), Right platform walker Transfers: Sit to/from Stand Sit to Stand: Min assist General transfer comment: increased time to power up     Ambulation / Gait / Stairs / Emergency planning/management officer   Ambulation/Gait Ambulation/Gait assistance: Herbalist (Feet): 100 Feet Assistive device: Rolling walker (2 wheels), Right platform walker Gait Pattern/deviations: Step-through pattern, Decreased step length - right, Decreased  step length - left, Shuffle, Narrow base of support, Ataxic General Gait Details: assist to manage RW and maintain balance. Several start/stops during gait trial. Pt with eyes closed during stops, needing verbal cues to return to task. Gait velocity: decreased     Posture / Balance Balance Overall balance assessment: Needs assistance, History of Falls Sitting-balance support: Feet supported, No upper extremity supported Sitting balance-Leahy Scale: Fair Standing balance support: During functional activity, Reliant on assistive device for balance, Bilateral upper extremity supported Standing balance-Leahy Scale: Poor Standing balance comment: reliant on external assist     Special needs/care consideration      Previous Home Environment (from acute therapy documentation) Living Arrangements: Alone Available Help at Discharge: Family, Personal care  attendant, Available 24 hours/day Type of Home: House Home Layout: One level Home Access: Stairs to enter Entrance Stairs-Rails: Can reach both Entrance Stairs-Number of Steps: 3 Bathroom Shower/Tub: Multimedia programmer: Handicapped height Bathroom Accessibility: Yes How Accessible: Accessible via wheelchair, Accessible via walker [place]: No Additional Comments: Pt has a home aid to 7x/wk for ~4 hours per day. She has assist form her daugthers as well, one of whom is an OT. Someone is with her from ~10am-6pm and then 6pm-9pm. Platform RW since wrist fracture. [name] daughter reported that family is planning to increase CG hours to 10AM-6:00PM   Discharge Living Setting Plans for Discharge Living Setting: Patient's home Type of Home at Discharge: House Discharge Home Layout: One level Discharge Home Access: Stairs to enter Entrance Stairs-Rails: Can reach both Entrance Stairs-Number of Steps: 3 Discharge Bathroom Shower/Tub: Walk-in shower Discharge Bathroom Toilet: Handicapped height Discharge Bathroom Accessibility:  Yes How Accessible: Accessible via wheelchair, Accessible via walker Does the patient have any problems obtaining your medications?: No   Social/Family/Support Systems Anticipated Caregiver: Estate agent and Actuary (daguhters) and personal care aid Anticipated Caregiver's Contact Information: [name]-[phone], [phone] Caregiver Availability: 24/7 Discharge Plan Discussed with Primary Caregiver: Yes Is Caregiver In Agreement with Plan?: Yes Does Caregiver/Family have Issues with Lodging/Transportation while Pt is in Rehab?: No   Goals Patient/Family Goal for Rehab: Supervision: PT/OT, Mod I:ST Expected length of stay: 7-10 days Pt/Family Agrees to Admission and willing to participate: Yes Program Orientation Provided & Reviewed with Pt/Caregiver Including Roles  & Responsibilities: Yes   Decrease burden of Care through IP rehab admission: NA   Possible need for SNF placement upon discharge: Not anticipated   Patient Condition: I have reviewed medical records from [facility], spoken with CM, and patient and daughter. I met with patient at the bedside and discussed via phone for inpatient rehabilitation assessment.  Patient will benefit from ongoing PT, OT, and SLP, can actively participate in 3 hours of therapy a day 5 days of the week, and can make measurable gains during the admission.  Patient will also benefit from the coordinated team approach during an Inpatient Acute Rehabilitation admission.  The patient will receive intensive therapy as well as Rehabilitation physician, nursing, social worker, and care management interventions.  Due to safety, disease management, medication administration, pain management, and patient education the patient requires 24 hour a day rehabilitation nursing.  The patient is currently Min A with mobility and Min G-Min A with basic ADLs.  Discharge setting and therapy post discharge at home with home health is anticipated.  Patient  has agreed to participate in the Acute Inpatient Rehabilitation Program and will admit today.   Preadmission Screen Completed By:  Bethel Born, 10/04/2022 10:15 AM ______________________________________________________________________   Discussed status with Dr. Curlene Dolphin on 10/04/22. at 10:15 AM and received approval for admission today.   Admission Coordinator:  Bethel Born, CCC-SLP, time 10:15 AM/Date 10/04/22     Assessment/Plan: Diagnosis:CVA Does the need for close, 24 hr/day Medical supervision in concert with the patient's rehab needs make it unreasonable for this patient to be served in a less intensive setting? Yes Co-Morbidities requiring supervision/potential complications: HTN, HLD, Tobacco, HLD, dementia,  Due to bladder management, bowel management, safety, skin/wound care, disease management, medication administration, pain management, and patient education, does the patient require 24 hr/day rehab nursing? Yes Does the patient require coordinated care of a physician, rehab nurse, PT, OT, and SLP to address physical  and functional deficits in the context of the above medical diagnosis(es)? Yes Addressing deficits in the following areas: balance, endurance, locomotion, strength, transferring, bowel/bladder control, bathing, dressing, feeding, grooming, toileting, cognition, speech, language, swallowing, and psychosocial support Can the patient actively participate in an intensive therapy program of at least 3 hrs of therapy 5 days a week? Yes The potential for patient to make measurable gains while on inpatient rehab is excellent Anticipated functional outcomes upon discharge from inpatient rehab: supervision PT, supervision OT, modified independent SLP Estimated rehab length of stay to reach the above functional goals is: 7-10             Anticipated discharge destination: Home 10. Overall Rehab/Functional Prognosis: excellent     MD Signature: Jennye Boroughs

## 2022-10-04 NOTE — Progress Notes (Signed)
[facility]) Hospital Liaison note:  This patient is currently enrolled in [facility] outpatient-based Palliative Care. Will continue to follow for disposition.  Please call with any outpatient palliative questions or concerns.  Thank you, Lorelee Market, LPN [facility] Liaison [phone]

## 2022-10-04 NOTE — Progress Notes (Signed)
Inpatient Rehabilitation Admission Medication Review by a Pharmacist  A complete drug regimen review was completed for this patient to identify any potential clinically significant medication issues.  High Risk Drug Classes Is patient taking? Indication by Medication  Antipsychotic No   Anticoagulant Yes Enoxaparin SQ - VTE ppx  Antibiotic No   Opioid No   Antiplatelet Yes ASA, plavix - CVA ppx  Hypoglycemics/insulin No   Vasoactive Medication Yes Losartan - HTN  Chemotherapy No   Other Yes Atorvastatin - HLD Donepezil - dementia Escitalopram - mood Nicotine patch - smoking cessation     Type of Medication Issue Identified Description of Issue Recommendation(s)  Drug Interaction(s) (clinically significant)     Duplicate Therapy     Allergy     No Medication Administration End Date     Incorrect Dose     Additional Drug Therapy Needed     Significant med changes from prior encounter (inform family/care partners about these prior to discharge). Deutetrabenazine non-formulary > family to bring in Alendronate non-formulary > resume at discharge Communicate medication changes with patient/family at discharge  Other       Clinically significant medication issues were identified that warrant physician communication and completion of prescribed/recommended actions by midnight of the next day:  No  Pharmacist comments: n/a   Time spent performing this drug regimen review (minutes): 20   Thank you for allowing pharmacy to be a part of this patient's care.  Ardyth Harps, PharmD Clinical Pharmacist

## 2022-10-04 NOTE — Discharge Instructions (Addendum)
Inpatient Rehab Discharge Instructions  Barbara Krause Discharge date and time: No discharge date for patient encounter.   Activities/Precautions/ Functional Status: Activity: Nonweightbearing right wrist Diet:  Soft Wound Care: Routine skin checks Functional status:  ___ No restrictions     ___ Walk up steps independently ___ 24/7 supervision/assistance   ___ Walk up steps with assistance ___ Intermittent supervision/assistance  ___ Bathe/dress independently ___ Walk with walker     _x__ Bathe/dress with assistance ___ Walk Independently    ___ Shower independently ___ Walk with assistance    ___ Shower with assistance ___ No alcohol     ___ Return to work/school ________  COMMUNITY REFERRALS UPON DISCHARGE:    Home Health:   PT     OT     ST                     Agency: [facility] Phone: [phone]    Medical Equipment/Items Ordered: Suction machine, Bedside Commode and Wheelchair Cushion                                                 Agency/Supplier: [employer] [phone]   Special Instructions:  No driving smoking or alcohol  Follow-up outpatient for incidental findings of right thyroid nodule as well as 1.6 cm left upper lobe groundglass opacity    STROKE/TIA DISCHARGE INSTRUCTIONS SMOKING Cigarette smoking nearly doubles your risk of having a stroke & is the single most alterable risk factor  If you smoke or have smoked in the last 12 months, you are advised to quit smoking for your health. Most of the excess cardiovascular risk related to smoking disappears within a year of stopping. Ask you doctor about anti-smoking medications [place] Quit Line: 1-800-QUIT NOW Free Smoking Cessation Classes [phone]  CHOLESTEROL Know your levels; limit fat & cholesterol in your diet  Lipid Panel     Component Value Date/Time   CHOL 133 10/02/2022 0105   TRIG 85 10/02/2022 0105   HDL 59 10/02/2022 0105   CHOLHDL 2.3 10/02/2022 0105   VLDL 17 10/02/2022 0105   LDLCALC 57  10/02/2022 0105     Many patients benefit from treatment even if their cholesterol is at goal. Goal: Total Cholesterol (CHOL) less than 160 Goal:  Triglycerides (TRIG) less than 150 Goal:  HDL greater than 40 Goal:  LDL (LDLCALC) less than 100   BLOOD PRESSURE American Stroke Association blood pressure target is less that 120/80 mm/Hg  Your discharge blood pressure is:    Monitor your blood pressure Limit your salt and alcohol intake Many individuals will require more than one medication for high blood pressure  DIABETES (A1c is a blood sugar average for last 3 months) Goal HGBA1c is under 7% (HBGA1c is blood sugar average for last 3 months)  Diabetes: No known diagnosis of diabetes    No results found for: "HGBA1C"  Your HGBA1c can be lowered with medications, healthy diet, and exercise. Check your blood sugar as directed by your physician Call your physician if you experience unexplained or low blood sugars.  PHYSICAL ACTIVITY/REHABILITATION Goal is 30 minutes at least 4 days per week  Activity: Increase activity slowly, Therapies: Physical Therapy: Home Health Return to work:  Activity decreases your risk of heart attack and stroke and makes your heart stronger.  It helps control your weight and blood  pressure; helps you relax and can improve your mood. Participate in a regular exercise program. Talk with your doctor about the best form of exercise for you (dancing, walking, swimming, cycling).  DIET/WEIGHT Goal is to maintain a healthy weight  Your discharge diet is:  Diet Order             DIET DYS 3 Room service appropriate? Yes; Fluid consistency: Thin  Diet effective now                   liquids Your height is:    Your current weight is:   Your Body Mass Index (BMI) is:    Following the type of diet specifically designed for you will help prevent another stroke. Your goal weight range is:   Your goal Body Mass Index (BMI) is 19-24. Healthy food habits can help  reduce 3 risk factors for stroke:  High cholesterol, hypertension, and excess weight.  RESOURCES Stroke/Support Group:  Call [phone]   STROKE EDUCATION PROVIDED/REVIEWED AND GIVEN TO PATIENT Stroke warning signs and symptoms How to activate emergency medical system (call 911). Medications prescribed at discharge. Need for follow-up after discharge. Personal risk factors for stroke. Pneumonia vaccine given: No Flu vaccine given: No My questions have been answered, the writing is legible, and I understand these instructions.  I will adhere to these goals & educational materials that have been provided to me after my discharge from the hospital.    aspirin alone  My questions have been answered and I understand these instructions. I will adhere to these goals and the provided educational materials after my discharge from the hospital.  Patient/Caregiver Signature _______________________________ Date __________  Clinician Signature _______________________________________ Date __________  Please bring this form and your medication list with you to all your follow-up doctor's appointments.

## 2022-10-05 ENCOUNTER — Ambulatory Visit: Payer: Medicare Other | Admitting: Family Medicine

## 2022-10-05 DIAGNOSIS — I63541 Cerebral infarction due to unspecified occlusion or stenosis of right cerebellar artery: Secondary | ICD-10-CM | POA: Diagnosis not present

## 2022-10-05 LAB — CBC WITH DIFFERENTIAL/PLATELET
Abs Immature Granulocytes: 0.02 K/uL (ref 0.00–0.07)
Basophils Absolute: 0.1 K/uL (ref 0.0–0.1)
Basophils Relative: 1 %
Eosinophils Absolute: 0.1 K/uL (ref 0.0–0.5)
Eosinophils Relative: 2 %
HCT: 38.5 % (ref 36.0–46.0)
Hemoglobin: 12.8 g/dL (ref 12.0–15.0)
Immature Granulocytes: 0 %
Lymphocytes Relative: 21 %
Lymphs Abs: 1.4 K/uL (ref 0.7–4.0)
MCH: 30.3 pg (ref 26.0–34.0)
MCHC: 33.2 g/dL (ref 30.0–36.0)
MCV: 91 fL (ref 80.0–100.0)
Monocytes Absolute: 0.9 K/uL (ref 0.1–1.0)
Monocytes Relative: 13 %
Neutro Abs: 4.5 K/uL (ref 1.7–7.7)
Neutrophils Relative %: 63 %
Platelets: 206 K/uL (ref 150–400)
RBC: 4.23 MIL/uL (ref 3.87–5.11)
RDW: 12.6 % (ref 11.5–15.5)
WBC: 7 K/uL (ref 4.0–10.5)
nRBC: 0 % (ref 0.0–0.2)

## 2022-10-05 LAB — COMPREHENSIVE METABOLIC PANEL
ALT: 20 U/L (ref 0–44)
AST: 23 U/L (ref 15–41)
Albumin: 3 g/dL — ABNORMAL LOW (ref 3.5–5.0)
Alkaline Phosphatase: 101 U/L (ref 38–126)
Anion gap: 9 (ref 5–15)
BUN: 16 mg/dL (ref 8–23)
CO2: 26 mmol/L (ref 22–32)
Calcium: 8.8 mg/dL — ABNORMAL LOW (ref 8.9–10.3)
Chloride: 108 mmol/L (ref 98–111)
Creatinine, Ser: 0.78 mg/dL (ref 0.44–1.00)
GFR, Estimated: >60 mL/min (ref >60)
Glucose, Bld: 96 mg/dL (ref 70–99)
Potassium: 3.6 mmol/L (ref 3.5–5.1)
Sodium: 143 mmol/L (ref 135–145)
Total Bilirubin: 0.8 mg/dL (ref 0.3–1.2)
Total Protein: 5.9 g/dL — ABNORMAL LOW (ref 6.5–8.1)

## 2022-10-05 NOTE — Evaluation (Signed)
Physical Therapy Assessment and Plan  Patient Details  Name: Barbara Krause MRN: [id number] Date of Birth: 1941/10/17  PT Diagnosis: Abnormal posture, Abnormality of gait, Cognitive deficits, Coordination disorder, Difficulty walking, Hemiparesis dominant, Impaired cognition, and Muscle weakness Rehab Potential: Good ELOS: 10-12 days   Today's Date: 10/05/2022 PT Individual Time: 3570-1779 PT Individual Time Calculation (min): 72 min    Hospital Problem: Principal Problem:   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[place]) Active Problems:   Thrush   Acute cough   Past Medical History:  Past Medical History:  Diagnosis Date   Breast cancer ([place]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   Depression    History of chicken pox    History of colon polyps    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 year old female with history significant for hypertension, breast cancer status post bilateral mastectomy 2009, memory deficit is maintained on Aricept, Huntington's disease followed at [facility], osteoporosis, history of tobacco use.  Per chart review patient lives alone.  1 level home 3 steps to entry.  She has a home health aide 7 times a week for 4 hours/day.  She also has assistance from her daughters.  Patient uses a platform rolling walker for mobility.  Family plans to increase PCA hours as needed.  Presented 10/01/2022 with slurred speech.  Noted recent fall when she struck her head no loss of consciousness and family noted increased somnolence over the past week.  CT/MRI showed punctate focus of acute ischemia in the right cerebellar hemisphere.  No hemorrhage or mass effect.  Old left cerebellar infarct and findings of chronic small  vessel ischemia.  CT cervical spine negative for fracture or malalignment.  Noted right thyroid nodule measuring 1.5 cm recommend outpatient follow-up.  Admission chemistries unremarkable, urinalysis negative.  Echocardiogram with ejection fraction of 60 to 65% no wall motion abnormalities.  CT angiogram head and neck no emergent large vessel occlusion or high-grade stenosis.  Noted findings of groundglass opacity left upper lobe measuring 1.6 cm recommend outpatient follow-up.  Neurology follow-up placed on aspirin and Plavix x 21 days then aspirin alone.  Lovenox added for DVT prophylaxis.  Tolerating a mechanical soft diet.  She reports worsening cough last 2-3 days. She is followed by [facility] for Right wrist distal radius fracture. Therapy evaluations completed due to patient decreased functional mobility and dysarthria was admitted for a comprehensive rehab program.   Patient currently requires mod with mobility secondary to muscle weakness, decreased cardiorespiratoy endurance, impaired timing and sequencing, decreased coordination, and decreased motor planning, decreased awareness, decreased problem solving, and decreased safety awareness, and decreased standing balance, decreased postural control, hemiplegia, decreased balance strategies, and difficulty maintaining precautions.  Prior to hospitalization, patient was modified independent  with mobility and lived with Alone in a House home.  Home access is 3Stairs to enter.  Patient will benefit from skilled PT intervention to maximize safe functional mobility, minimize fall risk, and decrease caregiver burden for planned discharge home with 24 hour supervision.  Anticipate patient will benefit from follow up [place] at discharge.  PT - End of Session Activity Tolerance: Tolerates 30+ min activity with multiple rests Endurance Deficit: Yes Endurance  Deficit Description: required rest breaks during session PT Assessment Rehab Potential (ACUTE/IP  ONLY): Good PT Barriers to Discharge: Home environment access/layout;Other (comments) PT Barriers to Discharge Comments: cognition and 3 STE with 2 railings PT Patient demonstrates impairments in the following area(s): Balance;Endurance;Motor;Nutrition;Safety;Skin Integrity;Edema PT Transfers Functional Problem(s): Bed Mobility;Bed to Chair;Car;Furniture PT Locomotion Functional Problem(s): Ambulation;Wheelchair Mobility;Stairs PT Plan PT Intensity: Minimum of 1-2 x/day ,45 to 90 minutes PT Frequency: 5 out of 7 days PT Duration Estimated Length of Stay: 10-12 days PT Treatment/Interventions: Ambulation/gait training;Discharge planning;Functional mobility training;Therapeutic Activities;Visual/perceptual remediation/compensation;Balance/vestibular training;Disease management/prevention;Neuromuscular re-education;Skin care/wound management;Therapeutic Exercise;Wheelchair propulsion/positioning;Cognitive remediation/compensation;DME/adaptive equipment instruction;Pain management;Splinting/orthotics;UE/LE Strength taining/ROM;Community reintegration;Patient/family education;Stair training;UE/LE Coordination activities PT Transfers Anticipated Outcome(s): supervision with LRAD PT Locomotion Anticipated Outcome(s): supervision with LRAD PT Recommendation Follow Up Recommendations: Home health PT Patient destination: Home Equipment Recommended: To be determined Equipment Details: has RW   PT Evaluation Precautions/Restrictions Precautions Precautions: Fall Restrictions Weight Bearing Restrictions: Yes RUE Weight Bearing: Non weight bearing Other Position/Activity Restrictions: RT Wrist fracture ~3 weeks ago Pain Interference Pain Interference Pain Effect on Sleep: 0. Does not apply - I have not had any pain or hurting in the past 5 days Pain Interference with Therapy Activities: 0. Does not apply - I have not received rehabilitationtherapy in the past 5 days Pain Interference with  Day-to-Day Activities: 1. Rarely or not at all Home Living/Prior [place] Available Help at Discharge: Family;Personal care attendant;Available 24 hours/day (aide currently comes 7 days/week for 4 hours but plan to incrase to 6-8 hours per day) Type of Home: House Home Access: Stairs to enter [employer] of Steps: 3 Entrance Stairs-Rails: Can reach both;Right;Left Home Layout: One level Bathroom Shower/Tub: Multimedia programmer: Handicapped height Bathroom Accessibility: Yes  Lives With: Alone Prior Function Level of Independence: Requires assistive device for independence (aide helps with cooking, cleaning, and transportation)  Able to Take Stairs?: Yes Driving: No Vision/Perception  Vision - History Ability to See in Adequate Light: 0 Adequate  Cognition Overall Cognitive Status: Impaired/Different from baseline Arousal/Alertness: Awake/alert Orientation Level: Oriented X4 Memory: Impaired Awareness: Impaired Problem Solving: Impaired Safety/Judgment: Impaired Sensation Sensation Light Touch: Appears Intact Proprioception: Appears Intact Coordination Gross Motor Movements are Fluid and Coordinated: No Fine Motor Movements are Fluid and Coordinated: No Coordination and Movement Description: grossly uncoordinated due to hemiparesis, generalized weakness/deconditioning, decreased balance strategies, and impaired motor planning/sequencing Finger Nose Finger Test: [facility] but slow on LUE, unable to flex R elbow due to cast Heel Shin Test: [facility] bilaterally Motor  Motor Motor: Hemiplegia Motor - Skilled Clinical Observations: grossly uncoordinated due to hemiparesis, generalized weakness/deconditioning, decreased balance strategies, and impaired motor planning/sequencing  Trunk/Postural Assessment  Cervical Assessment Cervical Assessment: Exceptions to [facility] (forward head) Thoracic Assessment Thoracic Assessment: Exceptions to [facility]  (kyphosis) Lumbar Assessment Lumbar Assessment: Exceptions to [facility] (posterior pelvic tilt) Postural Control Postural Control: Deficits on evaluation Trunk Control: posterior lean  Balance Balance Balance Assessed: Yes Static Sitting Balance Static Sitting - Balance Support: Feet supported;Bilateral upper extremity supported Static Sitting - Level of Assistance: 5: Stand by assistance (supervision) Dynamic Sitting Balance Dynamic Sitting - Balance Support: Feet supported;No upper extremity supported Dynamic Sitting - Level of Assistance: 5: Stand by assistance (supervision) Static Standing Balance Static Standing - Balance Support: Bilateral upper extremity supported;During functional activity (R PFRW) Static Standing - Level of Assistance: 3: Mod assist Dynamic Standing Balance Dynamic Standing - Balance Support: Bilateral upper extremity supported;During functional activity (R PFRW) Dynamic Standing - Level of Assistance: 3: Mod assist  Extremity Assessment  RLE Assessment RLE Assessment: Exceptions to [facility] RLE Strength Right Hip Flexion: 4-/5 Right Hip ABduction: 4/5 Right Hip ADduction: 4/5 Right Knee Flexion: 4-/5 Right Knee Extension: 4-/5 Right Ankle Dorsiflexion: 4+/5 Right Ankle Plantar Flexion: 4+/5 LLE Assessment LLE Assessment: Exceptions to [facility] LLE Strength Left Hip Flexion: 4-/5 Left Hip ABduction: 4/5 Left Hip ADduction: 4/5 Left Knee Flexion: 4-/5 Left Knee Extension: 4-/5 Left Ankle Dorsiflexion: 4/5 Left Ankle Plantar Flexion: 4/5  Care Tool Care Tool Bed Mobility Roll left and right activity   Roll left and right assist level: Supervision/Verbal cueing    Sit to lying activity        Lying to sitting on side of bed activity   Lying to sitting on side of bed assist level: the ability to move from lying on the back to sitting on the side of the bed with no back support.: Supervision/Verbal cueing     Care Tool Transfers Sit to stand transfer   Sit  to stand assist level: Moderate Assistance - Patient 50 - 74%    Chair/bed transfer   Chair/bed transfer assist level: Minimal Assistance - Patient > 75%     Toilet transfer   Assist Level: Moderate Assistance - Patient 50 - 74%    Car transfer   Car transfer assist level: Moderate Assistance - Patient 50 - 74%      Care Tool Locomotion Ambulation   Assist level: Minimal Assistance - Patient > 75% Assistive device: Walker-platform Max distance: 9f  Walk 10 feet activity   Assist level: Minimal Assistance - Patient > 75% Assistive device: Walker-platform   Walk 50 feet with 2 turns activity Walk 50 feet with 2 turns activity did not occur: Safety/medical concerns (fatigue and generalized weakness)      Walk 150 feet activity Walk 150 feet activity did not occur: Safety/medical concerns (fatigue and generalized weakness)      Walk 10 feet on uneven surfaces activity   Assist level: Moderate Assistance - Patient - 50 - 74% Assistive device: Walker-platform  Stairs Stair activity did not occur: Safety/medical concerns (fatigue and generalized weakness)        Walk up/down 1 step activity Walk up/down 1 step or curb (drop down) activity did not occur: Safety/medical concerns (fatigue and generalized weakness)      Walk up/down 4 steps activity Walk up/down 4 steps activity did not occur: Safety/medical concerns (fatigue and generalized weakness)      Walk up/down 12 steps activity Walk up/down 12 steps activity did not occur: Safety/medical concerns (fatigue and generalized weakness)      Pick up small objects from floor Pick up small object from the floor (from standing position) activity did not occur: Safety/medical concerns (decreased balance strategies (posterior lean in standing))      Wheelchair Is the patient using a wheelchair?: Yes Type of Wheelchair: Manual   Wheelchair assist level: Dependent - Patient 0%    Wheel 50 feet with 2 turns activity   Assist  Level: Dependent - Patient 0%  Wheel 150 feet activity   Assist Level: Dependent - Patient 0%    Refer to Care Plan for Long Term Goals  SHORT TERM GOAL WEEK 1 PT Short Term Goal 1 (Week 1): pt will transfer sit<>stand with LRAD and CGA PT Short Term Goal 2 (Week 1): pt will transfer bed<>chair with LRAD and CGA PT Short Term Goal 3 (Week 1): pt will ambulate 559fwith LRAD and CGA  Recommendations for  other services: None   Skilled Therapeutic Intervention Evaluation completed (see details above and below) with education on PT POC and goals and individual treatment initiated with focus on functional mobility/transfers, generalized strengthening and endurance, dynamic standing balance/coordination, simulated car transfers, and ambulation. Received pt semi-reclined in bed with daughter present at bedside. Pt educated on PT evaluation, CIR policies, and therapy schedule and agreeable. Pt denied any pain during session but speaks in low tone of voice.  Provided pt with legrests for personal WC and R PFRW. Pt transferred supine<>sitting EOB from flat bed without bedrails and supervision. Pt transferred sit<>stand with R PFRW and mod A - noted significant posterior and L lateral lean requiring mod fading to min A for static standing balance. Pt transferred bed<>WC stand<>pivot with R PFRW and min A and transported to/from room in [facility] dependently. Pt stood from [facility] with R PFRW and mod A (again with posterior bias) and performed simulated car transfer with R PFRW and mod A overall. Pt required max cues for safety, to turn completley around and back up to car prior to sitting. Noted pt with multiple posterior LOB backwards due to poor eccentric control when sitting.   Pt then ambulated 78f on uneven surfaces (ramp) with R PFRW and mod A overall. Pt required manual facilitation to keep RW close to her and for steering/controlling to keep wheels on floor, cues for RW proximity, and to increase step length.  In hallway, [facility] and ambulated additional 361fwith R PFRW and min A with close WC follow. Pt required continued cues to remain within RW BOS. Returned to room and pt reported urge to void. Transferred to/from toilet with bedside commode over top with min/mod A and required mod A for clothing management. Pt able to void and performed peri-care seated without assist. Pt then ambulated 1246fith R PFRW and min A to recliner - cues to slide RW along floor rather than pick it up. Concluded session with pt sitting in recliner, needs within reach, and chair pad alarm on. Safety plan updated.   Mobility Bed Mobility Bed Mobility: Rolling Right;Rolling Left;Supine to Sit Rolling Right: Supervision/verbal cueing Rolling Left: Supervision/Verbal cueing Supine to Sit: Supervision/Verbal cueing Transfers Transfers: Sit to Stand;Stand to Sit;Stand Pivot Transfers Sit to Stand: Moderate Assistance - Patient 50-74% Stand to Sit: Minimal Assistance - Patient > 75% Stand Pivot Transfers: Minimal Assistance - Patient > 75% Stand Pivot Transfer Details: Verbal cues for sequencing;Verbal cues for precautions/safety;Verbal cues for technique Stand Pivot Transfer Details (indicate cue type and reason): verbal cues for RW safety and turning technique Transfer (Assistive device): Right platform walker Locomotion  Gait Ambulation: Yes Gait Assistance: Minimal Assistance - Patient > 75% Gait Distance (Feet): 35 Feet Assistive device: Right platform walker Gait Assistance Details: Verbal cues for precautions/safety;Verbal cues for safe use of DME/AE Gait Assistance Details: verbal cues to remain within RW BOS and to keep wheels on floor rather than picking it up Gait Gait: Yes Gait Pattern: Impaired Gait Pattern: Step-to pattern;Step-through pattern;Decreased step length - right;Decreased step length - left;Decreased stride length;Poor foot clearance - left;Poor foot clearance -  right;Narrow base of support;Trunk flexed Gait velocity: decreased Stairs / Additional Locomotion Ramp: Moderate Assistance - Patient 50 - 74% (R PFRW) Wheelchair Mobility Wheelchair Mobility: No   Discharge Criteria: Patient will be discharged from PT if patient refuses treatment 3 consecutive times without medical reason, if treatment goals not met, if there is a  change in medical status, if patient makes no progress towards goals or if patient is discharged from hospital.  The above assessment, treatment plan, treatment alternatives and goals were discussed and mutually agreed upon: by patient and by family  Alfonse Alpers PT, DPT  10/05/2022, 12:13 PM

## 2022-10-05 NOTE — Progress Notes (Signed)
Inpatient Rehabilitation  Patient information reviewed and entered into eRehab system by Shamir Tuzzolino M. Anna Livers, M.A., CCC/SLP, PPS Coordinator.  Information including medical coding, functional ability and quality indicators will be reviewed and updated through discharge.    

## 2022-10-05 NOTE — Plan of Care (Signed)
  Problem: RH Swallowing Goal: LTG Patient will consume least restrictive diet using compensatory strategies with assistance (SLP) Description: LTG:  Patient will consume least restrictive diet using compensatory strategies with assistance (SLP) Flowsheets (Taken 10/05/2022 1403) LTG: Pt Patient will consume least restrictive diet using compensatory strategies with assistance of (SLP): Supervision   Problem: RH Expression Communication Goal: LTG Patient will increase speech intelligibility (SLP) Description: LTG: Patient will increase speech intelligibility at word/phrase/conversation level with cues, % of the time (SLP) Flowsheets (Taken 10/05/2022 1403) LTG: Patient will increase speech intelligibility (SLP): Supervision Level: Phrase Percent of time patient will use intelligible speech: 90

## 2022-10-05 NOTE — Progress Notes (Signed)
[facility] Individual Statement of Services  Patient Name:  Barbara Krause  Date:  10/05/2022  Welcome to the [facility].  Our goal is to provide you with an individualized program based on your diagnosis and situation, designed to meet your specific needs.  With this comprehensive rehabilitation program, you will be expected to participate in at least 3 hours of rehabilitation therapies Monday-Friday, with modified therapy programming on the weekends.  Your rehabilitation program will include the following services:  Physical Therapy (PT), Occupational Therapy (OT), Speech Therapy (ST), 24 hour per day rehabilitation nursing, Therapeutic Recreaction (TR), Neuropsychology, Care Coordinator, Rehabilitation Medicine, Nutrition Services, Pharmacy Services, and Other  Weekly team conferences will be held on Wednesdays to discuss your progress.  Your Inpatient Rehabilitation Care Coordinator will talk with you frequently to get your input and to update you on team discussions.  Team conferences with you and your family in attendance may also be held.  Expected length of stay: 7-10 Days  Overall anticipated outcome:  MOD I/Supervision  Depending on your progress and recovery, your program may change. Your Inpatient Rehabilitation Care Coordinator will coordinate services and will keep you informed of any changes. Your Inpatient Rehabilitation Care Coordinator's name and contact numbers are listed  below.  The following services may also be recommended but are not provided by the [facility]:   [place] will be made to provide these services after discharge if needed.  Arrangements include referral to agencies that provide these services.  Your insurance has been verified to be:  Medicare A & B Your primary doctor is:  Eliezer Lofts, MD  Pertinent information  will be shared with your doctor and your insurance company.  Inpatient Rehabilitation Care Coordinator:  Erlene Quan, [place] or [phone]  Information discussed with and copy given to patient by: Dyanne Iha, 10/05/2022, 10:56 AM

## 2022-10-05 NOTE — Progress Notes (Signed)
No critical labs need to be addressed urgently. We will discuss labs in detail at upcoming office visit.   

## 2022-10-05 NOTE — Progress Notes (Signed)
PROGRESS NOTE   Subjective/Complaints:  C/o nocturnal cough, denies cough with meals, CXR reviewed- ok , WBC normal  ROS- neg CP, SOB, N/V/D Objective:   DG Chest 2 View  Result Date: 10/04/2022 CLINICAL DATA:  Cough EXAM: CHEST - 2 VIEW COMPARISON:  Chest x-ray 10/01/2022 FINDINGS: The cardiomediastinal silhouette is stable, the heart is mildly enlarged. There is no focal lung infiltrate, pleural effusion or pneumothorax. Bilateral axillary surgical clips are present. No acute fractures are seen. IMPRESSION: No active cardiopulmonary disease. Electronically Signed   By: Ronney Asters M.D.   On: 10/04/2022 21:10   Recent Labs    10/05/22 0604  WBC 7.0  HGB 12.8  HCT 38.5  PLT 206   Recent Labs    10/05/22 0604  NA 143  K 3.6  CL 108  CO2 26  GLUCOSE 96  BUN 16  CREATININE 0.78  CALCIUM 8.8*    Intake/Output Summary (Last 24 hours) at 10/05/2022 0834 Last data filed at 10/04/2022 2155 Gross per 24 hour  Intake 118 ml  Output 100 ml  Net 18 ml        Physical Exam: Vital Signs Blood pressure (!) 147/92, pulse 70, temperature 98.4 F (36.9 C), temperature source Oral, resp. rate 18, height '5\' 6"'$  (1.676 m), weight 56.5 kg, SpO2 94 %.   General: No acute distress Mood and affect are appropriate Heart: Regular rate and rhythm no rubs murmurs or extra sounds Lungs: Clear to auscultation, breathing unlabored, no rales or wheezes Abdomen: Positive bowel sounds, soft nontender to palpation, nondistended Extremities: No clubbing, cyanosis, or edema Skin: No evidence of breakdown, no evidence of rash Neurologic: Cranial nerves II through XII intact, motor strength is 4/5 in left deltoid, bicep, tricep, grip, hip flexor, knee extensors, ankle dorsiflexor and plantar flexor  Cerebellar exam normal finger to nose to finger as well as heel to shin in LEFT upper and lower extremities RUE NT due to long arm cast   Musculoskeletal: Full range of motion in all 4 extremities. No joint swelling, RIght elbow in long arm cast    Assessment/Plan: 1. Functional deficits which require 3+ hours per day of interdisciplinary therapy in a comprehensive inpatient rehab setting. Physiatrist is providing close team supervision and 24 hour management of active medical problems listed below. Physiatrist and rehab team continue to assess barriers to discharge/monitor patient progress toward functional and medical goals  Care Tool:  Bathing              Bathing assist       Upper Body Dressing/Undressing Upper body dressing        Upper body assist      Lower Body Dressing/Undressing Lower body dressing            Lower body assist       Toileting Toileting    Toileting assist       Transfers Chair/bed transfer  Transfers assist           Locomotion Ambulation   Ambulation assist              Walk 10 feet activity   Assist  Walk 50 feet activity   Assist           Walk 150 feet activity   Assist           Walk 10 feet on uneven surface  activity   Assist           Wheelchair     Assist               Wheelchair 50 feet with 2 turns activity    Assist            Wheelchair 150 feet activity     Assist          Blood pressure (!) 147/92, pulse 70, temperature 98.4 F (36.9 C), temperature source Oral, resp. rate 18, height '5\' 6"'$  (1.676 m), weight 56.5 kg, SpO2 94 %.  Medical Problem List and Plan: 1. Functional deficits secondary to right cerebellar hemispheric punctate focus of acute ischemia             -patient may  shower             -ELOS/Goals: 7-10 days PT/OT Sup, SLP Mod I              -Admit to CIR 2.  Antithrombotics: -DVT/anticoagulation:  Pharmaceutical: Lovenox             -antiplatelet therapy: Aspirin 81 mg daily and Plavix 75 mg daily x 3 weeks then aspirin alone 3. Pain  Management: Tylenol as needed 4. Mood/Behavior/Sleep: Aricept 5 mg daily, Lexapro 20 mg daily             -antipsychotic agents: N/A 5. Neuropsych/cognition: This patient is capable of making decisions on her own behalf. Oriented with cues to date , remembered B day yesterday  6. Skin/Wound Care: Routine skin checks 7. Fluids/Electrolytes/Nutrition: Routine in and outs with follow-up chemistries 8.  Hypertension.  Cozaar 50 mg daily.  Monitor with increased mobility. Has been well controlled 9.  Hyperlipidemia.  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  Continue Deutetrabenazine. Discussed with daughter, she will bring her medication in from home 44.  Tobacco use.  NicoDerm patch.  Provide counseling 12.  Incidental findings of right thyroid nodule as well as 1.6 cm left upper lobe groundglass opacity.  Follow-up outpatient. 13. Dysphagia, Continue Dys 3 diet, SLP eval 14. [place], On aricept 15. Cough, recheck CXR 16. Right wrist distal radius fracture. Followed by [employer]. Non weight-bearing on the right wrist , long arm cast  17 Thrush. Order nystatin swish and swallow ordered    LOS: 1 days A FACE TO FACE EVALUATION WAS PERFORMED  Charlett Blake 10/05/2022, 8:34 AM

## 2022-10-05 NOTE — Evaluation (Signed)
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[place]) Active Problems:   Thrush   Acute cough   Past Medical History:  Past Medical History:  Diagnosis Date   Breast cancer ([place]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   Depression    History of chicken pox    History of colon polyps    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[place])    Kidney stone    Osteoporosis    Past Surgical History:  Past Surgical History:  Procedure Laterality Date   CESAREAN SECTION     1977 and 1979   CHOLECYSTECTOMY     TOTAL MASTECTOMY Bilateral 2009    Assessment & Plan Clinical Impression:  Barbara Krause is a 81 year old right-handed female with history significant for hypertension, breast cancer status post bilateral mastectomy 2009, memory deficit is maintained on Aricept, Huntington's disease followed at [facility], osteoporosis, history of tobacco use.  Per chart review patient lives alone.  1 level home 3 steps to entry.  She has a home health aide 7 times a week for 4 hours/day.  She also has assistance from her daughters.  Patient uses a platform rolling walker for mobility.  Family plans to increase PCA hours as needed.  Presented 10/01/2022 with slurred speech.  Noted recent fall when she struck her head no loss of consciousness and family noted increased somnolence over the past week.  CT/MRI showed punctate focus of acute ischemia in the right cerebellar hemisphere.  No hemorrhage or mass effect.  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  CT cervical spine negative  for fracture or malalignment.  Noted right thyroid nodule measuring 1.5 cm recommend outpatient follow-up.  Admission chemistries unremarkable, urinalysis negative.  Echocardiogram with ejection fraction of 60 to 65% no wall motion abnormalities.  CT angiogram head and neck no emergent large vessel occlusion or high-grade stenosis.  Noted findings of groundglass opacity left upper lobe measuring 1.6 cm recommend outpatient follow-up.  Neurology follow-up placed on aspirin and Plavix x 21 days then aspirin alone.  Lovenox added for DVT prophylaxis.  Tolerating a mechanical soft diet.  She reports worsening cough last 2-3 days. She is followed by [facility] for Right wrist distal radius fracture. Therapy evaluations completed due to patient decreased functional mobility and dysarthria was admitted for a comprehensive rehab program. Patient transferred to CIR on 10/04/2022 .    Patient currently requires mod with basic self-care skills secondary to muscle weakness, decreased cardiorespiratoy endurance, decreased coordination and decreased motor planning, decreased awareness, decreased problem solving, decreased safety awareness, and delayed processing, and decreased standing balance, hemiplegia, and difficulty maintaining precautions.  Prior to hospitalization, patient could complete all self-care independently  Patient will benefit from skilled intervention to decrease level of assist with basic self-care skills and increase independence with basic self-care skills prior to discharge home independently.  Anticipate patient will require 24 hour supervision and follow up outpatient.  OT - End of Session Activity Tolerance: Tolerates 10 - 20 min activity with multiple rests Endurance Deficit: Yes Endurance Deficit Description: required rest breaks during session OT Assessment Rehab Potential (ACUTE ONLY): Good OT Barriers  to Discharge: Home environment access/layout;Nutrition means OT Patient demonstrates  impairments in the following area(s): Balance;Cognition;Endurance;Motor;Nutrition;Safety OT Basic ADL's Functional Problem(s): Grooming;Bathing;Dressing;Toileting OT Transfers Functional Problem(s): Toilet;Tub/Shower OT Additional Impairment(s): Fuctional Use of Upper Extremity OT Plan OT Intensity: Minimum of 1-2 x/day, 45 to 90 minutes OT Frequency: 5 out of 7 days OT Duration/Estimated Length of Stay: 7-10 days OT Treatment/Interventions: Balance/vestibular training;Discharge planning;Pain management;Self Care/advanced ADL retraining;Therapeutic Activities;UE/LE Coordination activities;Functional mobility training;Patient/family education;Skin care/wound managment;Therapeutic Exercise;Disease mangement/prevention;Cognitive remediation/compensation;Community reintegration;DME/adaptive equipment instruction;Neuromuscular re-education;Psychosocial support;Splinting/orthotics;UE/LE Strength taining/ROM;Wheelchair propulsion/positioning OT Self Feeding Anticipated Outcome(s): Supervision OT Basic Self-Care Anticipated Outcome(s): Supervision OT Toileting Anticipated Outcome(s): Supervision OT Bathroom Transfers Anticipated Outcome(s): Supervision OT Recommendation Recommendations for Other Services: Therapeutic Recreation consult Therapeutic Recreation Interventions: Pet therapy Patient destination: Home Follow Up Recommendations: Outpatient OT Equipment Recommended: To be determined   OT Evaluation Precautions/Restrictions  Precautions Precautions: Fall Restrictions Weight Bearing Restrictions: Yes RUE Weight Bearing: Non weight bearing Other Position/Activity Restrictions: RT Wrist fracture ~3 weeks ago Home Living/Prior [place] expects to be discharged to:: Private residence Living Arrangements: Alone Available Help at Discharge: Family, Personal care attendant, Available 24 hours/day (aide currently comes 7 days/week for 4 hours but plan to incrase to  6-8 hours per day) Type of Home: House Home Access: Stairs to enter [employer] of Steps: 3 Entrance Stairs-Rails: Can reach both, Right, Left Home Layout: One level Bathroom Shower/Tub: Multimedia programmer: Handicapped height Bathroom Accessibility: Yes Additional Comments: All info from PT and acute documentation due to no family member present; Pt has a home aid to 7x/wk for ~4 hours per day. She has assist form her daugthers as well, one of whom is an OT. Someone is with her from ~10am-6pm and then 6pm-9pm. Platform RW since wrist fracture. [name] daughter reported that family is planning to increase CG hours to 10AM-6:00PM  Lives With: Alone IADL History Homemaking Responsibilities: No Prior Function Level of Independence: Requires assistive device for independence (aide helps with cooking, cleaning, and transportation. Pt reports though unsure on validity that prior to wrist fx she was doing ADLs independently, but daughters have done ADLs since about 3 weeks ago)  Able to Take Stairs?: Yes Driving: No Vision Baseline Vision/History: 1 Wears glasses (readers) Ability to See in Adequate Light: 0 Adequate Patient Visual Report: No change from baseline Vision Assessment?: Yes Eye Alignment: Within Functional Limits Ocular Range of Motion: Within Functional Limits Tracking/Visual Pursuits: Decreased smoothness of vertical tracking Saccades: Additional eye shifts occurred during testing;Undershoots Convergence: Within functional limits Visual Fields: No apparent deficits Perception  Perception: Within Functional Limits Praxis Praxis: Intact Cognition Cognition Overall Cognitive Status: Impaired/Different from baseline Arousal/Alertness: Awake/alert Memory: Impaired Awareness: Impaired Problem Solving: Impaired Safety/Judgment: Impaired Brief Interview for Mental Status (BIMS) Repetition of Three Words (First Attempt): 3 Temporal Orientation: Year:  Correct Temporal Orientation: Month: Accurate within 5 days Temporal Orientation: Day: Correct Recall: "Sock": Yes, no cue required Recall: "Blue": Yes, no cue required Recall: "Bed": Yes, no cue required BIMS Summary Score: 15 Sensation Sensation Light Touch: Appears Intact Hot/Cold: Not tested Proprioception: Appears Intact Stereognosis: Not tested Coordination Gross Motor Movements are Fluid and Coordinated: No Fine Motor Movements are Fluid and Coordinated: No Coordination and Movement Description: grossly uncoordinated due to hemiparesis, generalized weakness/deconditioning, decreased balance strategies, and impaired motor planning/sequencing Finger Nose Finger Test: [facility] but slow on LUE, unable to flex R elbow due to cast Heel Shin Test: [facility] bilaterally Motor  Motor Motor: Hemiplegia Motor - Skilled Clinical Observations: grossly uncoordinated  due to hemiparesis, generalized weakness/deconditioning, decreased balance strategies, and impaired motor planning/sequencing  Trunk/Postural Assessment  Cervical Assessment Cervical Assessment: Exceptions to [facility] (forward head) Thoracic Assessment Thoracic Assessment: Exceptions to [facility] (kyphosis) Lumbar Assessment Lumbar Assessment: Exceptions to [facility] (posterior pelvic) Postural Control Postural Control: Deficits on evaluation Trunk Control: posterior lean  Balance Balance Balance Assessed: Yes Static Sitting Balance Static Sitting - Balance Support: Feet supported;Bilateral upper extremity supported Static Sitting - Level of Assistance: 5: Stand by assistance (supervision) Dynamic Sitting Balance Dynamic Sitting - Balance Support: Feet supported;No upper extremity supported Dynamic Sitting - Level of Assistance: 5: Stand by assistance (supervision) Static Standing Balance Static Standing - Balance Support: Bilateral upper extremity supported;During functional activity (R PFRW) Static Standing - Level of Assistance: 3: Mod  assist Dynamic Standing Balance Dynamic Standing - Balance Support: Bilateral upper extremity supported;During functional activity (R PFRW) Dynamic Standing - Level of Assistance: 3: Mod assist Extremity/Trunk Assessment RUE Assessment RUE Assessment: Exceptions to [facility] Active Range of Motion (AROM) Comments: Shoulder flexion limited to 120 degrees, elbow/wrist casted therefore restricted ROM, digits WFL General Strength Comments: Unable to assess 2/2 NWB precaution LUE Assessment LUE Assessment: Exceptions to [facility] Active Range of Motion (AROM) Comments: WFL General Strength Comments: 4-/5 grossly  Care Tool Care Tool Self Care Eating   Eating Assist Level: Supervision/Verbal cueing    Oral Care    Oral Care Assist Level: Minimal Assistance - Patient > 75%    Bathing   Body parts bathed by patient: Right arm;Chest;Abdomen;Front perineal area;Right upper leg;Left upper leg;Face;Right lower leg Body parts bathed by helper: Left arm;Buttocks;Left lower leg   Assist Level: Moderate Assistance - Patient 50 - 74%    Upper Body Dressing(including orthotics)   What is the patient wearing?: Pull over shirt   Assist Level: Supervision/Verbal cueing    Lower Body Dressing (excluding footwear)   What is the patient wearing?: Incontinence brief;Pants Assist for lower body dressing: Moderate Assistance - Patient 50 - 74%    Putting on/Taking off footwear   What is the patient wearing?: Non-skid slipper socks Assist for footwear: Dependent - Patient 0%       Care Tool Toileting Toileting activity   Assist for toileting: Moderate Assistance - Patient 50 - 74%     Care Tool Bed Mobility Roll left and right activity   Roll left and right assist level: Supervision/Verbal cueing    Sit to lying activity        Lying to sitting on side of bed activity         Care Tool Transfers Sit to stand transfer   Sit to stand assist level: Moderate Assistance - Patient 50 - 74%     Chair/bed transfer   Chair/bed transfer assist level: Minimal Assistance - Patient > 75%     Toilet transfer   Assist Level: Moderate Assistance - Patient 50 - 74%     Care Tool Cognition  Expression of Ideas and Wants Expression of Ideas and Wants: 3. Some difficulty - exhibits some difficulty with expressing needs and ideas (e.g, some words or finishing thoughts) or speech is not clear  Understanding Verbal and Non-Verbal Content Understanding Verbal and Non-Verbal Content: 3. Usually understands - understands most conversations, but misses some part/intent of message. Requires cues at times to understand   Memory/Recall Ability Memory/Recall Ability : That he or she is in a hospital/hospital unit   Refer to Care Plan for [place] 1 OT  Short Term Goal 1 (Week 1): STG = LTG due to ELOS  Recommendations for other services: Therapeutic Recreation  Pet therapy   Skilled Therapeutic Intervention Patient received seated in recliner upon therapy arrival and agreeable to participate in OT evaluation. No pain reported during session. Education provided on OT purpose, therapy schedule, goals for therapy, and safety policy while in rehab. Patient demonstrates posterior lean, balance deficits, mild impulsivity, delayed processing and endurance deficits resulting in difficulty completing BADL tasks without increased physical assist. Pt will benefit from skilled OT services to focus on mentioned deficits. Daughter was present towards end of session to confirm details of PLOF. See below for ADL and functional transfer performance. During session with min A was able to ambulate 15 ft x 2 using R PFRW with w/c follow for fatigue. Pt remained upright in bed at conclusion of session with bed alarm on and all needs met at end of session.   ADL ADL Eating: Supervision/safety Where Assessed-Eating: Wheelchair Grooming: Minimal assistance Where Assessed-Grooming: Sitting at  sink Upper Body Bathing: Minimal assistance Where Assessed-Upper Body Bathing: Wheelchair Lower Body Bathing: Minimal assistance Where Assessed-Lower Body Bathing: Wheelchair Upper Body Dressing: Supervision/safety Where Assessed-Upper Body Dressing: Wheelchair Lower Body Dressing: Moderate assistance Where Assessed-Lower Body Dressing: Sitting at sink;Standing at sink Toileting: Moderate assistance Where Assessed-Toileting: Bedside Commode Toilet Transfer: Moderate assistance Toilet Transfer Method: Counselling psychologist: Radiographer, therapeutic: Not assessed Social research officer, government: Not assessed Mobility  Bed Mobility Bed Mobility: Rolling Right;Rolling Left;Supine to Sit Rolling Right: Supervision/verbal cueing Rolling Left: Supervision/Verbal cueing Supine to Sit: Supervision/Verbal cueing Transfers Sit to Stand: Moderate Assistance - Patient 50-74% Stand to Sit: Minimal Assistance - Patient > 75%   Discharge Criteria: Patient will be discharged from OT if patient refuses treatment 3 consecutive times without medical reason, if treatment goals not met, if there is a change in medical status, if patient makes no progress towards goals or if patient is discharged from hospital.  The above assessment, treatment plan, treatment alternatives and goals were discussed and mutually agreed upon: by patient  Blase Mess, MS, OTR/L  10/05/2022, 2:57 PM

## 2022-10-05 NOTE — Evaluation (Addendum)
Speech Language Pathology Assessment and Plan  Patient Details  Name: Barbara Krause MRN: [id number] Date of Birth: 12-15-1940  SLP Diagnosis: Dysarthria;Dysphagia  Rehab Potential: Fair ELOS: 10-12 days   Today's Date: 10/05/2022 SLP Individual Time: 1287-8676 SLP Individual Time Calculation (min): 60 min   Hospital Problem: Principal Problem:   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[place]) Active Problems:   Thrush   Acute cough  Past Medical History:  Past Medical History:  Diagnosis Date   Breast cancer ([place]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   Depression    History of chicken pox    History of colon polyps    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  Per chart review patient lives alone.  1 level home 3 steps to entry.  She has a home health aide 7 times a week for 4 hours/day.  She also has assistance from her daughters.  Patient uses a platform rolling walker for mobility.  Family plans to increase PCA hours as needed.  Presented 10/01/2022 with slurred speech.  Noted recent fall when she struck her head no loss of consciousness and family noted increased somnolence over the past week.  CT/MRI showed punctate focus of acute ischemia in the right cerebellar hemisphere.  No hemorrhage or mass effect.  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  CT cervical spine negative for fracture or malalignment.  Noted right thyroid nodule  measuring 1.5 cm recommend outpatient follow-up.  Admission chemistries unremarkable, urinalysis negative.  Echocardiogram with ejection fraction of 60 to 65% no wall motion abnormalities.  CT angiogram head and neck no emergent large vessel occlusion or high-grade stenosis.  Noted findings of groundglass opacity left upper lobe measuring 1.6 cm recommend outpatient follow-up.  Neurology follow-up placed on aspirin and Plavix x 21 days then aspirin alone.  Lovenox added for DVT prophylaxis.  Tolerating a mechanical soft diet.  She reports worsening cough last 2-3 days. She is followed by [facility] for Right wrist distal radius fracture. Therapy evaluations completed due to patient decreased functional mobility and dysarthria was admitted for a comprehensive rehab program.   SLP consulted to complete swallow, motor speech, and cognitive-linguistic evaluation in the setting of acute R cerebellar CVA; Huntington's Disease at baseline. Pt encountered awake/alert and sitting semi-reclined in bed; repositioned with assistance from daughter to maximize upright positioning for PO intake. Agreeaable to ST evaluation. Please note, pt with baseline deficits in swallowing, motor speech, and cognitive-linguistic skills in the setting of Huntington's Disease. Recently completed OP ST interventions targeting dysphagia and speech intelligibility.   Per clinical swallow evaluation, pt continues to present with s/sx concerning for multi-phase dysphagia c/b incoordinated oral phase, oral holding, piecemeal deglutition, multiple swallows per bolus, seemingly decreased hyolaryngeal excursion to digital palpation, and intermittent reflexive cough with intake of thin liquids. Pt appeared to tolerate smaller volumes of liquid with less instances of coughing this date. When provided with larger bolus size (10 cc), pt exhibited more frank  s/sx concerning for airway invasion to include explosive, strong cough, reddening of face, and  watery eyes. No clinical s/sx concerning for airway invasion noted with pureed and mechanical soft textures; however, oral deficits noted. Per chart review, CXR negative for acute process and vitals + WBC all WNL despite reports of wheezing upon admission to CIR. Pt and pt's daughter deny hx of pulmonary compromise to include PNA. Per most recent MBSS completed in August 2023, pt with aspiration of all liquid consistencies noted (thin, nectar, and honey) with recommendations for thin liquid given increased risk of pulmonary compromise when thickened vs thin liquid if aspirated, poor bolus flow through UES, and given pt's desire for comfort + quality of life; states she does not want G-tube. Motor speech evaluation revealed mild oral motor weakness and decreased speech intelligibility, resulting in ~80% intelligible at the short phrase level. Previous documentation from OP ST revealed stimulability for over-articulation, which appeared beneficial this date. Re: cognition, pt benefited from at least Min A verbal cues for recall, problem-solving, and attention, which pt and pt's daughter endorse to be consistent with baseline functioning given hx of Huntington's Disease; therefore, pt and pt's family would like to address acute changes in swallowing and speech at this time. Prior to admission, pt was living alone with personal aides and assistance from her two daughers (1 daughter is an OT); managed medications and finances.   Given clinical presentation, ongoing medical stability given known aspiration, and pt's preference to remain on least restrictive diet, recommend Dysphagia 3 textures cut up into very small pieces and thin liquids via 5 cc Provale cup with implementation of aspiration precautions to include small + single bites/sips, alternating bites/sips, slow rate, upright positioning, intermittent cough throughout intake, and use of Provale cup. Medications crushed in puree. Additionally, pt would likely  benefit from skilled ST intervention targeting dysphagia and motor speech deficits. Recommendations reviewed with pt and pt's daughter, [name], who verbalized understanding of aforementioned recommendations and appreciation. May consider repeat MBSS, as needed. Anticipated need for 24/7 supervision and assistance, as well as f/u OP or [place] ST given recent CVA has likely exacerbated baseline swallowing and motor speech deficits given location of lesion.      Skilled Therapeutic Interventions          CSE and motor speech evaluation completed. Please see full report for details  SLP Assessment  Patient will need skilled Speech Lanaguage Pathology Services during CIR admission    Recommendations  SLP Diet Recommendations: Dysphagia 3 (Mech soft);Thin Liquid Administration via:  (via 5 cc Provale cup) Medication Administration: Crushed with puree Compensations: Slow rate;Small sips/bites;Follow solids with liquid;Hard cough after swallow;Multiple dry swallows after each bite/sip Postural Changes and/or Swallow Maneuvers: Seated upright 90 degrees;Upright 30-60 min after meal Oral Care Recommendations: Oral care BID Recommendations for Other Services: Neuropsych consult Patient destination: Home Follow up Recommendations: Home Health SLP;Outpatient SLP;24 hour supervision/assistance Equipment Recommended: None recommended by SLP    SLP Frequency 3 to 5 out of 7 days   SLP Duration  SLP Intensity  SLP Treatment/Interventions 10-12 days  Minumum of 1-2 x/day, 30 to 90 minutes  Dysphagia/aspiration precaution training;Functional tasks;Environmental controls;Patient/family education;Multimodal communication approach    Pain Pain Assessment Pain Scale: 0-10 Pain Score: 0-No pain  Prior Functioning Cognitive/Linguistic Baseline: Baseline deficits Baseline deficit details: baseline speech,swallowing, and cognitive deficits from Huntington's disease Type of Home: House  Lives With:  Alone Available Help at Discharge: Family;Personal care attendant;Available 24 hours/day Vocation: Retired  Merchandiser, retail  Cognition Overall Cognitive Status: History of cognitive impairments - at baseline (per pt and family) Arousal/Alertness: Awake/alert Orientation Level: Oriented X4 Year: 2023 Month: December Day of Week: Incorrect Attention: Focused Focused Attention: Appears intact Sustained Attention: Impaired Sustained Attention Impairment: Verbal complex Memory: Impaired Memory Impairment: Decreased recall of new information;Retrieval deficit;Storage deficit Awareness: Impaired Awareness Impairment: Emergent impairment Problem Solving: Impaired Problem Solving Impairment: Verbal basic;Verbal complex Safety/Judgment: Impaired  Comprehension Auditory Comprehension Overall Auditory Comprehension: Appears within functional limits for tasks assessed Visual Recognition/Discrimination Discrimination: Not tested Reading Comprehension Reading Status: Not tested Expression Expression Primary Mode of Expression: Verbal Verbal Expression Overall Verbal Expression: Impaired at baseline Pragmatics: Impairment Impairments: Abnormal affect;Monotone Interfering Components: Premorbid deficit Written Expression Dominant Hand: Right Written Expression: Not tested Oral Motor Oral Motor/Sensory Function Overall Oral Motor/Sensory Function: Mild impairment Facial ROM: Reduced right Facial Strength: Reduced right;Reduced left Lingual ROM: Reduced right;Reduced left Lingual Symmetry: Within Functional Limits Lingual Strength: Reduced Velum: Within Functional Limits Mandible: Impaired Motor Speech Overall Motor Speech: Impaired at baseline Respiration: Within functional limits Phonation: Low vocal intensity Resonance: Within functional limits Articulation: Impaired Level of Impairment: Phrase Intelligibility: Intelligibility reduced Word: 75-100% accurate Phrase: 75-100%  accurate Sentence: 50-74% accurate Conversation: Not tested Motor Planning: Witnin functional limits Motor Speech Errors: Not applicable Interfering Components: Premorbid status Effective Techniques: Slow rate;Pacing;Over-articulate  Care Tool Care Tool Cognition Ability to hear (with hearing aid or hearing appliances if normally used Ability to hear (with hearing aid or hearing appliances if normally used): 1. Minimal difficulty - difficulty in some environments (e.g. when person speaks softly or setting is noisy)   Expression of Ideas and Wants Expression of Ideas and Wants: 3. Some difficulty - exhibits some difficulty with expressing needs and ideas (e.g, some words or finishing thoughts) or speech is not clear   Understanding Verbal and Non-Verbal Content Understanding Verbal and Non-Verbal Content: 3. Usually understands - understands most conversations, but misses some part/intent of message. Requires cues at times to understand  Memory/Recall Ability Memory/Recall Ability : That he or she is in a hospital/hospital unit    Bedside Swallowing Assessment General Date of Onset: 10/01/22 Previous Swallow Assessment: 06/28/2022, - MBSS, CSE on 10/02/2022 Diet Prior to this Study: Regular;Thin liquids Temperature Spikes Noted: No Respiratory Status: Room air History of Recent Intubation: No Behavior/Cognition: Alert;Cooperative;Pleasant mood Oral Cavity - Dentition: Adequate natural dentition Self-Feeding Abilities: Able to feed self;Needs set up Vision: Functional for self-feeding Patient Positioning: Upright in bed Baseline Vocal Quality: Low vocal intensity Volitional Cough: Strong Volitional Swallow: Able to elicit   Ice Chips Ice chips: Within functional limits Presentation: Spoon Thin Liquid Thin Liquid: Impaired Presentation: Straw;Cup;Self Fed;Spoon Oral Phase Functional Implications: Oral holding;Prolonged oral transit Pharyngeal  Phase Impairments: Suspected delayed  Swallow;Cough - Immediate;Decreased hyoid-laryngeal movement;Multiple swallows Other Comments: Less s/sx with use of 5 cc Provale cup Nectar Thick Nectar Thick Liquid: Not tested Honey Thick Honey Thick Liquid: Not tested Puree Puree: Impaired Presentation: Self Fed;Spoon Oral Phase Functional Implications: Oral holding Pharyngeal Phase Impairments: Multiple swallows Solid Solid: Impaired Presentation: Self Fed Oral Phase Impairments: Impaired mastication Oral Phase Functional Implications: Prolonged oral transit Pharyngeal Phase Impairments: Multiple swallows BSE Assessment Suspected Esophageal Findings Suspected Esophageal Findings:  (None observed or reported) Risk for Aspiration Impact on safety and function: Severe aspiration risk;Risk for inadequate nutrition/hydration;Moderate aspiration risk Other Related Risk Factors: History of dysphagia;History of esophageal-related issues;Cognitive impairment  Short Term Goals: Week 1: SLP Short Term Goal 1 (Week 1): Pt will tolerate Dysphagia 3  textures and thin liquids via 5 cc Provale cup with minimal s/sx concerning for airway invasion and stable medical status given Min A for adherence to aspiration precautions. SLP Short Term Goal 2 (Week 1): Pt will utilize speech intelligibility strategies to improve functional communicaiton and intelligibility to 90% with Min A verbal cues. SLP Short Term Goal 3 (Week 1): With use of external aids, pt will recall and implement aspiration precautions on 90% of PO trials without additional cues.  Refer to Care Plan for Long Term Goals  Recommendations for other services: Neuropsych  Discharge Criteria: Patient will be discharged from SLP if patient refuses treatment 3 consecutive times without medical reason, if treatment goals not met, if there is a change in medical status, if patient makes no progress towards goals or if patient is discharged from hospital.  The above assessment, treatment  plan, treatment alternatives and goals were discussed and mutually agreed upon: by patient and by family  Romelle Starcher A Damien Cisar 10/05/2022, 1:36 PM

## 2022-10-05 NOTE — Progress Notes (Signed)
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 She has a 37.50 pack-year smoking history. She has never used smokeless tobacco. She reports current alcohol use. She reports that she does not use drugs.  Family / Support Systems Children: Ivar Drape and Education officer, environmental Anticipated Caregiver: Dispensing optician Ability/Limitations of Caregiver: none Caregiver Availability: 24/7 Family Dynamics: support from daughters  Social History Preferred language: English Religion:  Health Literacy - How often do you need to have someone help you when you read instructions, pamphlets, or other written material from your doctor or pharmacy?: Never Writes: Yes Guardian/Conservator: HCPOA: Estate agent   Abuse/Neglect Abuse/Neglect Assessment Can Be Completed: Yes Physical Abuse: Denies Verbal Abuse: Denies Sexual Abuse: Denies Exploitation of patient/patient's resources: Denies Self-Neglect: Denies  Patient response to: Social Isolation - How often do you feel lonely or isolated from those around you?: Never  Emotional Status Recent Psychosocial Issues: coping, hx of depression Psychiatric History: n/a Substance Abuse History: n/a  Patient / Family Perceptions, Expectations &  Goals Pt/Family understanding of illness & functional limitations: yes Premorbid pt/family roles/activities: Patient independent and in the community for appointments primarily Anticipated changes in roles/activities/participation: Daughters and aid able to supervise and assist pt in home Pt/family expectations/goals: [place]: None Premorbid Home Care/DME Agencies: Other (Comment) (RW, WC, Shower seat, BSC) Transportation available at discharge: daughters able to transport Is the patient able to respond to transportation needs?: Yes In the past 12 months, has lack of transportation kept you from medical appointments or from getting medications?: No In the past 12 months, has lack of transportation kept you from meetings, work, or from getting things needed for daily living?: No Resource referrals recommended: Neuropsychology  Discharge Planning Living Arrangements: Alone Support Systems: Children, Other (Comment) (PCA) Type of Residence: Private residence Insurance Resources: [employer], Multimedia programmer (specify) Nurse, mental health MEDICARE) Financial Resources: Family Support, Social Security Financial Screen Referred: No Living Expenses: Own Money Management: Patient, Family Does the patient have any problems obtaining your medications?: No Home Management: Independent Patient/Family Preliminary Plans: Patient able to receive assistance from aid or daughters Care Coordinator Anticipated Follow Up Needs: [place] Additional Notes/Comments: Patient has aid daily for 4 hours a day.  Daughter is an OT. Has supervision 10AM-6PM and 6-9PM. Expected length of stay: 7-10 Days  Clinical Impression Sw met with patient in room, introduced self and explained role. Sw called patient daughter, Ivar Drape and provided the same information. Daughter works and prefers if we email her at [email or web address]  or upload information to [employer]. SW will leave  information on daughter's VM as well. Daughters anticipate patient returning home with supervision between themselves and their aid. No additional questions or  concerns, sw will follow up with daughter tomorrow.   Dyanne Iha 10/05/2022, 1:32 PM

## 2022-10-06 DIAGNOSIS — I63541 Cerebral infarction due to unspecified occlusion or stenosis of right cerebellar artery: Secondary | ICD-10-CM | POA: Diagnosis not present

## 2022-10-06 LAB — VITAMIN D 25 HYDROXY (VIT D DEFICIENCY, FRACTURES): Vit D, 25-Hydroxy: 91.08 ng/mL (ref 30–100)

## 2022-10-06 LAB — BASIC METABOLIC PANEL
Anion gap: 9 (ref 5–15)
BUN: 19 mg/dL (ref 8–23)
CO2: 27 mmol/L (ref 22–32)
Calcium: 9 mg/dL (ref 8.9–10.3)
Chloride: 105 mmol/L (ref 98–111)
Creatinine, Ser: 0.89 mg/dL (ref 0.44–1.00)
GFR, Estimated: >60 mL/min (ref >60)
Glucose, Bld: 98 mg/dL (ref 70–99)
Potassium: 3.7 mmol/L (ref 3.5–5.1)
Sodium: 141 mmol/L (ref 135–145)

## 2022-10-06 LAB — MAGNESIUM: Magnesium: 2.1 mg/dL (ref 1.7–2.4)

## 2022-10-06 MED ORDER — POTASSIUM CHLORIDE 20 MEQ PO PACK
20.0000 meq | PACK | Freq: Every day | ORAL | Status: DC
  Administered 2022-10-06 – 2022-10-07 (×2): 20 meq via ORAL
  Filled 2022-10-06 (×2): qty 1

## 2022-10-06 MED ORDER — VITAMIN D 25 MCG (1000 UNIT) PO TABS
1000.0000 [IU] | ORAL_TABLET | Freq: Every day | ORAL | Status: DC
  Administered 2022-10-06 – 2022-10-08 (×3): 1000 [IU] via ORAL
  Filled 2022-10-06 (×3): qty 1

## 2022-10-06 MED ORDER — L-METHYLFOLATE-B6-B12 3-35-2 MG PO TABS
1.0000 | ORAL_TABLET | Freq: Two times a day (BID) | ORAL | Status: DC
  Administered 2022-10-06 – 2022-10-08 (×5): 1 via ORAL
  Filled 2022-10-06 (×6): qty 1

## 2022-10-06 MED ORDER — BOOST / RESOURCE BREEZE PO LIQD CUSTOM
1.0000 | Freq: Every day | ORAL | Status: DC
  Administered 2022-10-06 – 2022-10-08 (×3): 1 via ORAL

## 2022-10-06 MED ORDER — MAGNESIUM GLUCONATE 500 MG PO TABS
250.0000 mg | ORAL_TABLET | Freq: Every day | ORAL | Status: DC
  Administered 2022-10-06 – 2022-10-11 (×6): 250 mg via ORAL
  Filled 2022-10-06 (×6): qty 1

## 2022-10-06 NOTE — Progress Notes (Signed)
PROGRESS NOTE   Subjective/Complaints: CXR reviewed and stable Discussed that labs from yesterday were stable, that we are checking vitamin D, magnesium, and homocysteine today  ROS: denies pain  Objective:   DG Chest 2 View  Result Date: 10/04/2022 CLINICAL DATA:  Cough EXAM: CHEST - 2 VIEW COMPARISON:  Chest x-ray 10/01/2022 FINDINGS: The cardiomediastinal silhouette is stable, the heart is mildly enlarged. There is no focal lung infiltrate, pleural effusion or pneumothorax. Bilateral axillary surgical clips are present. No acute fractures are seen. IMPRESSION: No active cardiopulmonary disease. Electronically Signed   By: Ronney Asters M.D.   On: 10/04/2022 21:10   Recent Labs    10/05/22 0604  WBC 7.0  HGB 12.8  HCT 38.5  PLT 206   Recent Labs    10/05/22 0604 10/06/22 0810  NA 143 141  K 3.6 3.7  CL 108 105  CO2 26 27  GLUCOSE 96 98  BUN 16 19  CREATININE 0.78 0.89  CALCIUM 8.8* 9.0   No intake or output data in the 24 hours ending 10/06/22 1009       Physical Exam: Vital Signs Blood pressure (!) 146/91, pulse 72, temperature 98.5 F (36.9 C), temperature source Oral, resp. rate 18, height '5\' 6"'$  (1.676 m), weight 56.5 kg, SpO2 93 %.   General: No acute distress, BMI 20.1 Mood and affect are appropriate Heart: Regular rate and rhythm no rubs murmurs or extra sounds Lungs: Clear to auscultation, breathing unlabored, no rales or wheezes Abdomen: Positive bowel sounds, soft nontender to palpation, nondistended Extremities: No clubbing, cyanosis, or edema Skin: No evidence of breakdown, no evidence of rash Neurologic: Cranial nerves II through XII intact, motor strength is 4/5 in left deltoid, bicep, tricep, grip, hip flexor, knee extensors, ankle dorsiflexor and plantar flexor  Cerebellar exam normal finger to nose to finger as well as heel to shin in LEFT upper and lower extremities RUE NT due to long  arm cast  Musculoskeletal: Full range of motion in all 4 extremities. No joint swelling, RIght elbow in long arm cast    Assessment/Plan: 1. Functional deficits which require 3+ hours per day of interdisciplinary therapy in a comprehensive inpatient rehab setting. Physiatrist is providing close team supervision and 24 hour management of active medical problems listed below. Physiatrist and rehab team continue to assess barriers to discharge/monitor patient progress toward functional and medical goals  Care Tool:  Bathing    Body parts bathed by patient: Right arm, Chest, Abdomen, Front perineal area, Right upper leg, Left upper leg, Face, Right lower leg   Body parts bathed by helper: Left arm, Buttocks, Left lower leg     Bathing assist Assist Level: Moderate Assistance - Patient 50 - 74%     Upper Body Dressing/Undressing Upper body dressing   What is the patient wearing?: Pull over shirt    Upper body assist Assist Level: Supervision/Verbal cueing    Lower Body Dressing/Undressing Lower body dressing      What is the patient wearing?: Incontinence brief, Pants     Lower body assist Assist for lower body dressing: Moderate Assistance - Patient 50 - 74%     Toileting Toileting  Toileting assist Assist for toileting: Moderate Assistance - Patient 50 - 74%     Transfers Chair/bed transfer  Transfers assist     Chair/bed transfer assist level: Minimal Assistance - Patient > 75%     Locomotion Ambulation   Ambulation assist      Assist level: Minimal Assistance - Patient > 75% Assistive device: Walker-platform Max distance: 94f   Walk 10 feet activity   Assist     Assist level: Minimal Assistance - Patient > 75% Assistive device: Walker-platform   Walk 50 feet activity   Assist Walk 50 feet with 2 turns activity did not occur: Safety/medical concerns (fatigue and generalized weakness)         Walk 150 feet activity   Assist Walk 150  feet activity did not occur: Safety/medical concerns (fatigue and generalized weakness)         Walk 10 feet on uneven surface  activity   Assist     Assist level: Moderate Assistance - Patient - 50 - 74% Assistive device: Walker-platform   Wheelchair     Assist Is the patient using a wheelchair?: Yes Type of Wheelchair: Manual    Wheelchair assist level: Dependent - Patient 0%      Wheelchair 50 feet with 2 turns activity    Assist        Assist Level: Dependent - Patient 0%   Wheelchair 150 feet activity     Assist      Assist Level: Dependent - Patient 0%   Blood pressure (!) 146/91, pulse 72, temperature 98.5 F (36.9 C), temperature source Oral, resp. rate 18, height '5\' 6"'$  (1.676 m), weight 56.5 kg, SpO2 93 %.  Medical Problem List and Plan: 1. Functional deficits secondary to right cerebellar hemispheric punctate focus of acute ischemia             -patient may  shower             -ELOS/Goals: 7-10 days PT/OT Sup, SLP Mod I              1,000U daily vitamin D started 2.  Impaired mobility: continue Lovenox since ambulating 25 feet             -antiplatelet therapy: Aspirin 81 mg daily and Plavix 75 mg daily x 3 weeks then aspirin alone 3. Pain Management: Tylenol as needed 4. Mood/Behavior/Sleep: Aricept 5 mg daily, Lexapro 20 mg daily             -antipsychotic agents: N/A 5. Neuropsych/cognition: This patient is capable of making decisions on her own behalf. Oriented with cues to date , remembered B day yesterday  6. Skin/Wound Care: Routine skin checks 7. Fluids/Electrolytes/Nutrition: Routine in and outs with follow-up chemistries 8.  Hypertension.  Cozaar 50 mg daily.  Monitor with increased mobility. Has been well controlled. Daily klow 276m added 9.  Hyperlipidemia.  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  [facility] Deut[facility]ab[facility]. Discussed with daughter, she will bring her medication in  from home 1129 Tobacco use.  NicoDerm patch.  Provide counseling 12.  Incidental findings of right thyroid nodule as well as 1.6 cm left upper lobe groundglass opacity.  Follow-up outpatient. 13. Dysphagia, Continue Dys 3 diet, SLP eval 14. [place] 15. Cough, recheck CXR 16. Right wrist distal radius fracture. Followed by [facility] weight-bearing on the right wrist , long arm cast  17 Thrush. Order nystatin swish and swallow ordered 18.  Suboptimal potassium: daily klor 52mw added    LOS: 2 days A FACE TO FACE EVALUATION WAS PERFORMED  Barbara Krause Barbara Krause 10/06/2022, 10:09 AM

## 2022-10-06 NOTE — Progress Notes (Signed)
Occupational Therapy Session Note  Patient Details  Name: Barbara Krause MRN: [id number] Date of Birth: 12-10-1940  Today's Date: 10/06/2022 OT Individual Time: 1020-1130 & 1300-1330 OT Individual Time Calculation (min): 70 min & 30 min   Short Term Goals: Week 1:  OT Short Term Goal 1 (Week 1): STG = LTG due to ELOS  Skilled Therapeutic Interventions/Progress Updates:  Session 1 Skilled OT intervention completed with focus on functional endurance and ADL retraining within a shower context. Pt received upright in bed, agreeable to session. No pain reported during session. Pt agreeable to shower.  Completed bed mobility with CGA to EOB and cues for scooting forward prior to standing. Sit > stand with initial mod A due to posterior lean but with cues for anterior weight shift faded to min A to R PFRW. Min A ambulatory transfer from EOB to [facility] over toilet in bathroom with use of R PFRW with mod to max verbal and external cues for motor planning and positioning walker. Min A needed for doffing pants prior to sitting, then continent of void. Pericare with supervision at the seated level, mod A sit > stand and min A ambulatory transfer to tub bench with mod cues needed for side stepping and use of grab bar to sit on bench. Able to doff shirt with supervision.  Waterproof cover applied to R arm cast prior to shower. Completed all bathing seated this session with mod cues needed for initiation and sequencing with overall mod A needed for bathing for feet and L arm as well as assist with hair washing and shower head assist. Mod A stand with grab bar and then stand pivot to w/c.   Able to dress UB with education on hemi-technique with supervision vs min A, LB with mod A at the sit > stand level with R PFRW. Completed hair grooming with therapist drying hair and pt participating in combing hair.  Mod A stand pivot to recliner, with pt remaining upright in recliner with chair pad alarm on/activated, and  with all needs in reach at end of session.  Session 2 Skilled OT intervention completed with focus on blocked practice with sit > stands in prep for ADL management. Pt received upright in bed, agreeable to session. No pain reported.  Of note- pt had cup with straw in room and reported drinking her entire drink at lunch with that method but per SLP is supposed to only consume thins with provale cup. Pt was able to verbalize that she isn't supposed to use straws however expressed she coughs less with this. SLP notified of pt report and situation.  Transitioned to EOB with CGA, CGA scooting to EOB, then mod A sit > stand with heavy posterior lean/push, then min A stand pivot to w/c. Transported in w/c <> gym for time.  Completed x10 sit > stands without AD, with initial mod A however fading by rep 3 to CGA with cues for BLE positioning, anterior lean and pushing up with L hand and able to maintain stance with CGA for balance for about 15 seconds each.   Back in room, pt completed min A sit > stand and stand pivot to recliner with R PFRW and remained upright, with chair alarm on/activated, and with all needs in reach at end of session.   Therapy Documentation Precautions:  Precautions Precautions: Fall Restrictions Weight Bearing Restrictions: Yes RUE Weight Bearing: Non weight bearing Other Position/Activity Restrictions: RT Wrist fracture ~3 weeks ago    Therapy/Group: Individual Therapy  [place], MS, OTR/L  10/06/2022, 3:29 PM

## 2022-10-06 NOTE — Progress Notes (Signed)
Speech Language Pathology Daily Session Note  Patient Details  Name: Barbara Krause MRN: [id number] Date of Birth: 30-May-1941  Today's Date: 10/06/2022 SLP Individual Time: 0730-0820 SLP Individual Time Calculation (min): 50 min  Short Term Goals: Week 1: SLP Short Term Goal 1 (Week 1): Pt will tolerate Dysphagia 3 textures and thin liquids via 5 cc Provale cup with minimal s/sx concerning for airway invasion and stable medical status given Min A for adherence to aspiration precautions. SLP Short Term Goal 2 (Week 1): Pt will utilize speech intelligibility strategies to improve functional communicaiton and intelligibility to 90% with Min A verbal cues. SLP Short Term Goal 3 (Week 1): With use of external aids, pt will recall and implement aspiration precautions on 90% of PO trials without additional cues.  Skilled Therapeutic Interventions: Pt seen this date for skilled ST intervention targeting functional communication/speech intelligibility and swallowing. Pt encountered sleeping in bed; aroused easily to name. Repositioned in bed with + 2 to optimize safety for PO intake. When asked about breakfast, pt reported that she generally does not eat breakfast, and will instead drink a Boost. Agreeable to intervention.  SLP facilitated swallowing function by providing pt's preference for oral nutritional supplement via 5 cc Provale cup with set-up A. Oral care provided via suction toothbrush given Mod A for thoroughness and to enhance safety with PO in the setting of known aspiration. Pt continues to exhibit s/sx concerning for airway invasion to include strong, reflexive cough on ~50% of trials from cup. Benefited from Mod A verbal cues to utilize compensatory swallowing strategies and to monitor and self-correct oral residuals post-swallow. Introduced Estate agent (purpose and rationale for use) with pt tolerating up to 10 cm H2O with good to fair breath support and activation of device. Utilize  over-articulation to read birthday card aloud with ~75% intelligibility. Will plan to proceed with MBSS given worsening of tolerance for thin liquids since CVA, though pt has not been receiving Huntington's medication since admission, per MD report. Will speak with daughter re: proceeding with MBSS given pt does not want thickened liquids nor PEG. Per daughter, would like to proceed with MBSS; will schedule.  Pt left in room and in bed with all safety measures activated. Call bell within reach and all needs met. Continue per current ST POC.  Pain No reports of pain; NAD  Therapy/Group: Individual Therapy  Erisha Paugh A Morty Ortwein 10/06/2022, 12:41 PM

## 2022-10-06 NOTE — Patient Care Conference (Cosign Needed)
Inpatient RehabilitationTeam Conference and Plan of Care Update Date: 10/06/2022   Time: 11:39 AM    Patient Name: Barbara Krause      Medical Record Number: [id number]  Date of Birth: [birth year] Sex: Female         Room/Bed: 4W20C/4W20C-01 Payor Info: Payor: MEDICARE / Plan: MEDICARE PART A AND B / Product Type: *No Product type* /    Admit Date/Time:  10/04/2022  1:58 PM  Primary Diagnosis:  Cerebral infarction involving right cerebellar artery [facility])  Hospital Problems: Principal Problem:   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[place]) Active Problems:   Thrush   Acute cough    Expected Discharge Date: Expected Discharge Date: 10/15/22  Team Members Present: Physician leading conference: Dr. Leeroy Cha Social Worker Present: Erlene Quan, BSW Nurse Present: Tacy Learn, RN PT Present: Terence Lux, PT OT Present: Jennefer Bravo, OT SLP Present: Helaine Chess, SLP PPS Coordinator present : Gunnar Fusi, SLP     Current Status/Progress Goal Weekly Team Focus  Bowel/Bladder   pt is continent of b/b.   remain continent   Assist with toileting qshift and prn    Swallow/Nutrition/ Hydration   Min A   Sup A  tolerance of current diet and thin liquids via 5 cc Provale cup    ADL's   min A oral care, supervision UB dressing, mod A LB bathing/dressing, mod A toileting   Supervision   functional sit > stand, activity tolerance, balance, safety awareness, family ed    Mobility   bed mobility supervision, transfers with R PFRW mod A, gait 85f with R PFRW and min A   supervision  functional mobility/transfers, generalized strengthening and endurance, dynamic standing balance/coordination, gait training, stair training, family education, D/C planning    Communication   Min A   Sup A at phrase level - 90%   speech intelliigbility strategy training    Safety/Cognition/ Behavioral Observations  At baseline, per family   N/A   N/A     Pain   no c/o pain   remain pain free   Assess pain qshift    Skin   skin intact   Remain intact  Assess skin qshift      Discharge Planning:  Patient discharging home with assistance from aide and 2 daughters.   Team Discussion: Patient refusing a PEG placement but continues to have swallowing issues, coughing, silent aspiration of liquids.  Continue with  D3 thin- provale cup for now.  Patient on target to meet rehab goals: yes, currently able to ambulate up to 350 with mod assist using a platform walker due to wrist fracture; weight bearing through elbow only.  Needs supervision for upper body care and mod assist for lower body and toileting. Goals for discharge set for min assist overall.  *See Care Plan and progress notes for long and short-term goals.   Revisions to Treatment Plan:  N/A   Teaching Needs: Safety, medications, weight bearing precautions, dietary modifications, transfers, toileting, etc.  Current Barriers to Discharge: Decreased caregiver support and Weight bearing restrictions  Possible Resolutions to Barriers: Family education Family to hire caregivers for 24/7 care as both daughters DME: pending     Medical Summary Current Status: right wrist fracture, suboptimal potassium level, HTN, dementia, CVA, thrush  Barriers to Discharge: Medical stability;Weight bearing restrictions  Barriers to Discharge Comments: right wrist fracture, suboptimal potassium level, HTN, dementia, CVA, thrush Possible Resolutions to [employer]: weightbearing through right elbow only, started klor 211m daily, continue  donepezil, contiunue cozaar, start Metanx BID, nystatin ordered   Continued Need for Acute Rehabilitation Level of Care: The patient requires daily medical management by a physician with specialized training in physical medicine and rehabilitation for the following reasons: Direction of a multidisciplinary physical rehabilitation program to  maximize functional independence : Yes Medical management of patient stability for increased activity during participation in an intensive rehabilitation regime.: Yes Analysis of laboratory values and/or radiology reports with any subsequent need for medication adjustment and/or medical intervention. : Yes   I attest that I was present, lead the team conference, and concur with the assessment and plan of the team.   Dorien Chihuahua B 10/06/2022, 4:40 PM

## 2022-10-06 NOTE — Progress Notes (Signed)
Physical Therapy Session Note  Patient Details  Name: Barbara Krause MRN: [id number] Date of Birth: 10/03/1941  Today's Date: 10/06/2022 PT Individual Time: 1434-1534 PT Individual Time Calculation (min): 60 min   Short Term Goals: Week 1:  PT Short Term Goal 1 (Week 1): pt will transfer sit<>stand with LRAD and CGA PT Short Term Goal 2 (Week 1): pt will transfer bed<>chair with LRAD and CGA PT Short Term Goal 3 (Week 1): pt will ambulate 50ft with LRAD and CGA  Skilled Therapeutic Interventions/Progress Updates:  Patient greeted sitting in bedside recliner and agreeable to PT treatment session. Patient performed sit/stand with R PFRW and ModA for initiating standing secondary to posterior weight shift. Patient performed stand pivot transfer to wheelchair with R PFRW and MinA- Patient required assistance for managing R PFRW secondary to poor initiation/motor planning. Patient demonstrated poor bilateral foot clearance and step length with shuffling and festinating noticed. Patient vaults with R LE during terminal stance phase of gait. Patient wheeled to day room for time management and energy conservation. Patient gait trained x193' with R PFRW and MinA- Patient demonstrated improved automatic gait when therapist advanced the walker. With turns and as she fatigued, patient demonstrated decreased bilateral step length and foot clearance with a shuffling gait pattern. Patient performed sit/stand x10 with therapist sitting in front of the patient- VC for scooting forward, tucking feet underneath her and separating them and increased anterior weight shift throughout stand. Patient demonstrated significant improvements with increased repetition and was able to performed sit/stand without AD and CGA. Patient gait trained x65', x80' without the use of an AD and CGA/MinA with L HHA- Patient demonstrated improved fluidity of gait without the use of the walker, however as she fatigued continued to demonstrate a  shuffling gait pattern. Patient returned to her room sitting upright in wheelchair with NT present, posey belt on, call bell within reach and all needs met.    Therapy Documentation Precautions:  Precautions Precautions: Fall Restrictions Weight Bearing Restrictions: Yes RUE Weight Bearing: Non weight bearing Other Position/Activity Restrictions: RT Wrist fracture ~3 weeks ago   Therapy/Group: Individual Therapy     10/06/2022, 7:57 AM  

## 2022-10-07 ENCOUNTER — Inpatient Hospital Stay (HOSPITAL_COMMUNITY): Payer: Medicare Other

## 2022-10-07 ENCOUNTER — Ambulatory Visit: Payer: Medicare Other | Admitting: Family Medicine

## 2022-10-07 LAB — CBC WITH DIFFERENTIAL/PLATELET
Abs Immature Granulocytes: 0.02 10*3/uL (ref 0.00–0.07)
Basophils Absolute: 0 10*3/uL (ref 0.0–0.1)
Basophils Relative: 1 %
Eosinophils Absolute: 0 10*3/uL (ref 0.0–0.5)
Eosinophils Relative: 1 %
HCT: 41.7 % (ref 36.0–46.0)
Hemoglobin: 13.4 g/dL (ref 12.0–15.0)
Immature Granulocytes: 0 %
Lymphocytes Relative: 18 %
Lymphs Abs: 1.2 10*3/uL (ref 0.7–4.0)
MCH: 29.8 pg (ref 26.0–34.0)
MCHC: 32.1 g/dL (ref 30.0–36.0)
MCV: 92.9 fL (ref 80.0–100.0)
Monocytes Absolute: 0.6 10*3/uL (ref 0.1–1.0)
Monocytes Relative: 9 %
Neutro Abs: 4.6 10*3/uL (ref 1.7–7.7)
Neutrophils Relative %: 71 %
Platelets: 245 10*3/uL (ref 150–400)
RBC: 4.49 MIL/uL (ref 3.87–5.11)
RDW: 12.7 % (ref 11.5–15.5)
WBC: 6.4 10*3/uL (ref 4.0–10.5)
nRBC: 0 % (ref 0.0–0.2)

## 2022-10-07 LAB — URINALYSIS, ROUTINE W REFLEX MICROSCOPIC
Bilirubin Urine: NEGATIVE
Glucose, UA: NEGATIVE mg/dL
Ketones, ur: NEGATIVE mg/dL
Nitrite: NEGATIVE
Protein, ur: 30 mg/dL — AB
Specific Gravity, Urine: 1.025 (ref 1.005–1.030)
WBC, UA: >50 WBC/hpf — ABNORMAL HIGH (ref 0–5)
pH: 5 (ref 5.0–8.0)

## 2022-10-07 LAB — COMPREHENSIVE METABOLIC PANEL
ALT: 34 U/L (ref 0–44)
AST: 37 U/L (ref 15–41)
Albumin: 3.4 g/dL — ABNORMAL LOW (ref 3.5–5.0)
Alkaline Phosphatase: 112 U/L (ref 38–126)
Anion gap: 9 (ref 5–15)
BUN: 26 mg/dL — ABNORMAL HIGH (ref 8–23)
CO2: 28 mmol/L (ref 22–32)
Calcium: 9.3 mg/dL (ref 8.9–10.3)
Chloride: 105 mmol/L (ref 98–111)
Creatinine, Ser: 0.93 mg/dL (ref 0.44–1.00)
GFR, Estimated: >60 mL/min (ref >60)
Glucose, Bld: 116 mg/dL — ABNORMAL HIGH (ref 70–99)
Potassium: 3.3 mmol/L — ABNORMAL LOW (ref 3.5–5.1)
Sodium: 142 mmol/L (ref 135–145)
Total Bilirubin: 0.7 mg/dL (ref 0.3–1.2)
Total Protein: 6.7 g/dL (ref 6.5–8.1)

## 2022-10-07 LAB — HOMOCYSTEINE: Homocysteine: 10.8 umol/L (ref 0.0–19.2)

## 2022-10-07 LAB — PROCALCITONIN: Procalcitonin: <0.1 ng/mL

## 2022-10-07 MED ORDER — IOHEXOL 9 MG/ML PO SOLN: 500.0000 mL | ORAL | Status: AC

## 2022-10-07 MED ORDER — POTASSIUM CHLORIDE 20 MEQ PO PACK
40.0000 meq | PACK | Freq: Every day | ORAL | Status: DC
  Administered 2022-10-07 – 2022-10-12 (×6): 40 meq via ORAL
  Filled 2022-10-07 (×6): qty 2

## 2022-10-07 NOTE — Progress Notes (Signed)
Occupational Therapy Session Note  Patient Details  Name: Barbara Krause MRN: [id number] Date of Birth: 1941-06-24  Today's Date: 10/07/2022 OT Individual Time: 4076-8088 OT Individual Time Calculation (min): 21 min    Today's Date: 10/07/2022 OT Missed Time: 34 Minutes Missed Time Reason: MD hold (comment);CT/MRI;Other (comment) (PA requested to hold therapy until stat CT and bloodwork is completed.)   Short Term Goals: Week 1:  OT Short Term Goal 1 (Week 1): STG = LTG due to ELOS  Skilled Therapeutic Interventions/Progress Updates:    Patient agreeable to participate in OT session. Reports 0/10 pain level.   Daughter arrived in room with Nurse shortly after OT arrived. Daughter informed therapist that pt did not seem like herself. She just looked at her and didn't respond when daughter spoke to her. She is concerned. PA arrived shortly after and confirmed stat CT and blood work to be completed and to hold therapy until results.   Pt participated in skilled OT session focusing on ADL re-training and functional transfers. Therapist educated patient on use of compensatory technique to set-up toothbrush with toothpaste due to right long arm cast in order to promote improved independence with grooming at w/c level. Pt provided with set-up assist and VC with visual demonstration on technique in order to utilize in the future. Pt requested to transfer into bed. Pt complete std pvt transfer towards right side. VC provided to lean slightly forward before standing up.  VC provided for fall prevention such as stepping strategy. Tactile cues provided to pt's left hip to initiate hip flexion in order to transition to sitting. Moderate assist provided to complete sit to stand transition. Once standing, Min A provided to complete functional transfer from chair to bed. Daughter assisted to boost patient towards [facility].   Therapy Documentation Precautions:  Precautions Precautions: Fall Restrictions Weight  Bearing Restrictions: Yes RUE Weight Bearing: Non weight bearing Other Position/Activity Restrictions: RT Wrist fracture ~3 weeks ago   Therapy/Group: Individual Therapy  Ailene Ravel, OTR/L,CBIS  Supplemental OT - [facility] and [facility]  10/07/2022, 8:06 AM

## 2022-10-07 NOTE — Progress Notes (Addendum)
Modified Barium Swallow Progress Note  Patient Details  Name: Lorena Benham MRN: [id number] Date of Birth: 30-Oct-1941  Today's Date: 10/07/2022  Modified Barium Swallow completed.  Full report located under Chart Review in the Imaging Section.  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 Per instrumental findings this date, pt presents with worsening multi-phase dysphagia (severe vs moderate-severe in August 2023), which resulted in persistent silent penetration with resultant aspiration of all consistencies assessed (thin liquid through puree) on each trial. Only texture that was not aspirated was Dysphagia 3, though was only tested once for time purposes. Aspiration was noted to be trace to mild-moderate in volume with no compensatory methods appearing beneficial. Chin tuck and head turn did not eliminate aspiration events, though chin tuck with 5 cc bolus did appear to reduce the volume of aspirated material inconsistently at best. Cued cough only cleared a portion of aspirated material from trachea. Pharyngeal stasis was diffuse and cleared with secondary swallow. Oral phase was complicated by oral holding, piecemeal deglutition, premature spillage, and decreased sensory awareness of residuals. Continues to present with esophageal component, though does not appear to be the primary cause of pt's dysphagia sx's. Please see full radiology report for further details.   Given instrumental observations and pt preferences to remain on least restrictive diet and avoid alternative means of nutrition/hydration short and long-term, recommend continuation of current diet textures that are cut into very small pieces or pureed textures to mitigate choking risk and thin liquids (water is best). Pt appears more sensate to aspiration and produces  reflexive cough response with smaller sips; therefore, may continue with Provale cup if desired. If pt and family wish to ever pursue more conservative measures, recommend NPO with alternate means of nutrition and hydration. Do not anticipate pt's swallow function to improve much, if any. Education provided re: risks and ramifications of aspiration to include PNA, illness, and pulmonary compromise; pt reports she her wishes to remain the same (no AMON and least restrictive diet). MD aware and states to hold pt's diet as is given her wishes. Furthermore, recommend Palliative Care consult to further confirm pt and families decision re: nutrition and hydration. Fortunately, pt's vital signs, WBC, and CXR have all been stable.   Swallow Evaluation Recommendations   SLP Diet Recommendations: Dysphagia 3 (Mech soft) solids;Thin liquid (At pt's risk/preference)    Liquid Administration via: Cup   Medication Administration: Crushed with puree   Supervision: Patient able to self feed;Full supervision/cueing for compensatory strategies   Compensations: Slow rate;Small sips/bites;Follow solids with liquid;Hard cough after swallow;Multiple dry swallows after each bite/sip   Postural Changes: Remain semi-upright after after feeds/meals (Comment);Seated upright at 90 degrees   Oral Care Recommendations: Oral care QID;Staff/trained caregiver to provide oral care;Oral care before and after PO        Gwendolyne Welford A Gerry Blanchfield 10/07/2022,12:58 PM

## 2022-10-07 NOTE — Progress Notes (Signed)
Occupational Therapy Session Note  Patient Details  Name: Barbara Krause MRN: [id number] Date of Birth: 1941/03/21  Today's Date: 10/07/2022 OT Individual Time: 7824-2353 OT Individual Time Calculation (min): 42 min  OT missed time: 18 mins Missed time reason: chest x-ray   Short Term Goals: Week 1:  OT Short Term Goal 1 (Week 1): STG = LTG due to ELOS  Skilled Therapeutic Interventions/Progress Updates:   Per prior PT, pt was placed on medical hold from PA due to CT scan and change in status. Per verbal order from PA- pt cleared for therapy at current time due to negative CT with plan for UA and chest x-ray, in which pt did miss 18 mins of skilled OT intervention for. Will make up missed time as able.  Skilled OT intervention completed with focus on toileting needs and family education. Pt received upright in bed, asleep, easily woken. Pt stated "I haven't done so good today" with pt also reporting fatigue especially after the MBS study. Pt verbalized readiness to get OOB due to extended nap and was agreeable to session. Daughter who is also an OT was present. No pain reported during session.  Pt agreeable to go to bathroom, as per nursing- a urine sample was needed. Completed bed mobility with CGA to EOB, CGA for scooting EOB. Min A sit > stand using R PFRW with mild posterior lean but pt able to correct with CGA. Ambulated with light min A to BSC over toilet with pt demonstrating slow gait pattern, reliance on external cueing and verbal cues from therapist for R/L sequencing to stay with walker vs pushing walker and feet not following. Able to assist with doffing pants with min A, then therapist utilized a sterile wipe to front peri-area prior to sitting with total A for clean urine sample. Continent of void, with pt able to void in urine hat and handoff to nurse for testing.  Able to complete toileting with overall min A, then ambulated back to EOB with earlier methods demonstrated with  mod-max verbal cues needed for positioning of the walker and pivoting to EOB. Transitioned into bed with CGA, with +2 utilized for boosting upright in bed. Pt was then taken to chest x-ray.  Daughter expressed emotion and concerns regarding her moms change in gait and functional abilities. Therapist offered therapeutic use of self and active listening for emotional support and discussed what this OT experienced with pt on eval day with the posterior lean, with some noted change with her gait pattern. Encouraged daughter to wait for UA and x-ray details to be returned but also agreed that checking in with her Huntington's neurologist would be appropriate.  Therapy Documentation Precautions:  Precautions Precautions: Fall Restrictions Weight Bearing Restrictions: Yes RUE Weight Bearing: Non weight bearing Other Position/Activity Restrictions: RT Wrist fracture ~3 weeks ago    Therapy/Group: Individual Therapy  Blase Mess, MS, OTR/L  10/07/2022, 4:17 PM

## 2022-10-07 NOTE — IPOC Note (Signed)
Overall Plan of Care [facility]) Patient Details Name: Barbara Krause MRN: [id number] DOB: 08/06/1941  Admitting Diagnosis: Cerebral infarction involving right cerebellar artery [facility])  Hospital Problems: Principal Problem:   Cerebral infarction involving right cerebellar artery [facility]) Active Problems:   Thrush   Acute cough     Functional Problem List: Nursing Bladder, Bowel, Perception, Safety, Sensory, Endurance, Medication Management, Motor  PT Balance, Endurance, Motor, Nutrition, Safety, Skin Integrity, Edema  OT Balance, Cognition, Endurance, Motor, Nutrition, Safety  SLP Nutrition, Motor  TR         Basic ADL's: OT Grooming, Bathing, Dressing, Toileting     Advanced  ADL's: OT       Transfers: PT Bed Mobility, Bed to Chair, Car, Manufacturing systems engineer, Metallurgist: PT Ambulation, Emergency planning/management officer, Stairs     Additional Impairments: OT Fuctional Use of Upper Extremity  SLP Swallowing, Communication expression    TR      Anticipated Outcomes Item Anticipated Outcome  Self Feeding Supervision  Swallowing  Sup A   Basic self-care  Supervision  Toileting  Supervision   Bathroom Transfers Supervision  Bowel/Bladder  continent x 2  Transfers  supervision with LRAD  Locomotion  supervision with LRAD  Communication  Sup A  Cognition  N/A - at baseline  Pain  less than 3  Safety/Judgment  remain fall free while n rehab   Therapy Plan: PT Intensity: Minimum of 1-2 x/day ,45 to 90 minutes PT Frequency: 5 out of 7 days PT Duration Estimated Length of Stay: 10-12 days OT Intensity: Minimum of 1-2 x/day, 45 to 90 minutes OT Frequency: 5 out of 7 days OT Duration/Estimated Length of Stay: 7-10 days SLP Intensity: Minumum of 1-2 x/day, 30 to 90 minutes SLP Frequency: 3 to 5 out of 7 days SLP Duration/Estimated Length of Stay: 10-12 days   Team Interventions: Nursing Interventions Patient/Family Education, Disease Management/Prevention,  Discharge Planning, Bladder Management, Psychosocial Support, Bowel Management, Medication Management  PT interventions Ambulation/gait training, Discharge planning, Functional mobility training, Therapeutic Activities, Visual/perceptual remediation/compensation, Balance/vestibular training, Disease management/prevention, Neuromuscular re-education, Skin care/wound management, Therapeutic Exercise, Wheelchair propulsion/positioning, Cognitive remediation/compensation, DME/adaptive equipment instruction, Pain management, Splinting/orthotics, UE/LE Strength taining/ROM, Academic librarian, Barrister's clerk education, IT trainer, UE/LE Coordination activities  OT Interventions Training and development officer, Discharge planning, Pain management, Self Care/advanced ADL retraining, Therapeutic Activities, UE/LE Coordination activities, Functional mobility training, Patient/family education, Skin care/wound managment, Therapeutic Exercise, Disease mangement/prevention, Cognitive remediation/compensation, Academic librarian, Engineer, drilling, Neuromuscular re-education, Psychosocial support, Splinting/orthotics, UE/LE Strength taining/ROM, Wheelchair propulsion/positioning  SLP Interventions Dysphagia/aspiration precaution training, Functional tasks, Environmental controls, Patient/family education, Multimodal communication approach  TR Interventions    SW/CM Interventions Discharge Planning, Psychosocial Support, Disease Management/Prevention, Patient/Family Education   Barriers to Discharge MD  Medical stability  Nursing Decreased caregiver support, Home environment access/layout, Incontinence, Weight bearing restrictions, Medication compliance 1 level home with 3 ste and rails on R/L  PT Home environment access/layout, Other (comments) cognition and 3 STE with 2 railings  OT Home environment access/layout, Nutrition means    SLP      SW       Team Discharge  Planning: Destination: PT-Home ,OT- Home , SLP-Home Projected Follow-up: PT-Home health PT, OT-  Outpatient OT, SLP-Home Health SLP, Outpatient SLP, 24 hour supervision/assistance Projected Equipment Needs: PT-To be determined, OT- To be determined, SLP-None recommended by SLP Equipment Details: PT-has RW, OT-  Patient/family involved in discharge planning: PT- Patient, Family member/caregiver,  OT-Patient, SLP-Patient, Family member/caregiver  MD ELOS: 61  days Medical Rehab Prognosis:  Excellent Assessment: The patient has been admitted for CIR therapies with the diagnosis of right CVA. The team will be addressing functional mobility, strength, stamina, balance, safety, adaptive techniques and equipment, self-care, bowel and bladder mgt, patient and caregiver education. Goals have been set at [place]. Anticipated discharge destination is home.        See Team Conference Notes for weekly updates to the plan of care

## 2022-10-07 NOTE — Progress Notes (Signed)
Physical Therapy Session Note  Patient Details  Name: Barbara Krause MRN: [id number] Date of Birth: July 14, 1941  Today's Date: 10/07/2022 PT Individual Time: 0945-1028 PT Individual Time Calculation (min): 43 min   Short Term Goals: Week 1:  PT Short Term Goal 1 (Week 1): pt will transfer sit<>stand with LRAD and CGA PT Short Term Goal 2 (Week 1): pt will transfer bed<>chair with LRAD and CGA PT Short Term Goal 3 (Week 1): pt will ambulate 61f with LRAD and CGA  Skilled Therapeutic Interventions/Progress Updates:   Received pt semi-reclined in bed returning from swallow study. Pt agreeable to PT treatment and denied any pain during session. Session with emphasis on functional mobility/transfers, toileting, dynamic standing balance, and gait training. Pt transferred semi-reclined<>sitting EOB with HOB elevated and use of bedrails with supervision. Pt stood with R PFRW and mod A due to posterior lean and ambulated 161fx 1 and 12f57f 1 with R PFRW and min A in/out of bathroom. Pt with tendency to push PFRW too far forward and required max verbal and visual cues to remain inside RW BOS. Pt also required assist to steer/control and advance RW and noticed when therapist advanced RW, pt's reciprocal stepping pattern improved. Pt also with poor eccentric control when sitting, frequently plopping down into WC despite cues for controlled descent. SLP arrived to discuss results of swallow study with pt/daughter. Pt stood with R PFRW and mod A and ambulated additional 38f212fth R PFRW and min A - limited by fatigue and weakness in BLE. Per daughter, pt did not have posterior lean prior to admission, was able to step feet rather than shuffle, and did not require assist to advance RW. [name] daughter concerned with pt's declined in functional status and pt did present today with impaired initiation, motor planning, and sequencing compared to earlier this week - notified MD and treatment team with suggestion for  additional imaging. Concluded session with pt sitting in WC, needs within reach, and seatbelt alarm on. SLP and daughter present at bedside.   Therapy Documentation Precautions:  Precautions Precautions: Fall Restrictions Weight Bearing Restrictions: Yes RUE Weight Bearing: Non weight bearing Other Position/Activity Restrictions: RT Wrist fracture ~3 weeks ago  Therapy/Group: Individual Therapy AnnaAlfonse Alpers DPT  10/07/2022, 7:07 AM

## 2022-10-07 NOTE — Progress Notes (Signed)
PROGRESS NOTE   Subjective/Complaints: Therapy notes worsening mental status and performance with therapy today. CT ordered and was stable. CBC is stable. K+ has decreased, will add supplement  ROS: denies pain, +cough  Objective:   CT HEAD WO CONTRAST (5MM)  Result Date: 10/07/2022 CLINICAL DATA:  Interval decline in function status post stroke. EXAM: CT HEAD WITHOUT CONTRAST TECHNIQUE: Contiguous axial images were obtained from the base of the skull through the vertex without intravenous contrast. RADIATION DOSE REDUCTION: This exam was performed according to the departmental dose-optimization program which includes automated exposure control, adjustment of the mA and/or kV according to patient size and/or use of iterative reconstruction technique. COMPARISON:  10/01/22 FINDINGS: Brain: No evidence of acute infarction, hemorrhage, hydrocephalus, extra-axial collection or mass lesion/mass effect. Remote infarct within the left cerebellar hemisphere is unchanged, image 9/3. There is mild diffuse low-attenuation within the subcortical and periventricular white matter compatible with chronic microvascular disease. Prominence of sulci and ventricles compatible with brain atrophy. Vascular: No hyperdense vessel or unexpected calcification. Skull: Normal. Negative for fracture or focal lesion. Sinuses/Orbits: No acute finding. Other: None. IMPRESSION: 1. No acute intracranial abnormalities. 2. Chronic microvascular disease and brain atrophy. 3. Remote left cerebellar hemisphere infarct. Electronically Signed   By: Kerby Moors M.D.   On: 10/07/2022 12:12   Recent Labs    10/05/22 0604 10/07/22 1053  WBC 7.0 6.4  HGB 12.8 13.4  HCT 38.5 41.7  PLT 206 245   Recent Labs    10/06/22 0810 10/07/22 1053  NA 141 142  K 3.7 3.3*  CL 105 105  CO2 27 28  GLUCOSE 98 116*  BUN 19 26*  CREATININE 0.89 0.93  CALCIUM 9.0 9.3   No intake or  output data in the 24 hours ending 10/07/22 1332       Physical Exam: Vital Signs Blood pressure (!) 145/90, pulse 65, temperature 97.7 F (36.5 C), resp. rate 15, height '5\' 6"'$  (1.676 m), weight 56.5 kg, SpO2 93 %.   General: No acute distress, BMI 20.1 Mood and affect are appropriate Heart: Regular rate and rhythm no rubs murmurs or extra sounds Lungs: Clear to auscultation, breathing unlabored, no rales or wheezes Abdomen: Positive bowel sounds, soft nontender to palpation, nondistended Extremities: No clubbing, cyanosis, or edema Skin: No evidence of breakdown, no evidence of rash Neurologic: Cranial nerves II through XII intact, motor strength is 4/5 in left deltoid, bicep, tricep, grip, hip flexor, knee extensors, ankle dorsiflexor and plantar flexor  Cerebellar exam normal finger to nose to finger as well as heel to shin in LEFT upper and lower extremities RUE NT due to long arm cast  Musculoskeletal: Full range of motion in all 4 extremities. No joint swelling, RIght elbow in long arm cast  ModA for sit to stand   Assessment/Plan: 1. Functional deficits which require 3+ hours per day of interdisciplinary therapy in a comprehensive inpatient rehab setting. Physiatrist is providing close team supervision and 24 hour management of active medical problems listed below. Physiatrist and rehab team continue to assess barriers to discharge/monitor patient progress toward functional and medical goals  Care Tool:  Bathing    Body parts bathed  by patient: Right arm, Chest, Abdomen, Front perineal area, Right upper leg, Left upper leg, Face, Right lower leg   Body parts bathed by helper: Left arm, Buttocks     Bathing assist Assist Level: Moderate Assistance - Patient 50 - 74%     Upper Body Dressing/Undressing Upper body dressing   What is the patient wearing?: Pull over shirt    Upper body assist Assist Level: Supervision/Verbal cueing    Lower Body  Dressing/Undressing Lower body dressing      What is the patient wearing?: Underwear/pull up, Pants     Lower body assist Assist for lower body dressing: Moderate Assistance - Patient 50 - 74%     Toileting Toileting    Toileting assist Assist for toileting: Moderate Assistance - Patient 50 - 74%     Transfers Chair/bed transfer  Transfers assist     Chair/bed transfer assist level: Minimal Assistance - Patient > 75%     Locomotion Ambulation   Ambulation assist      Assist level: Minimal Assistance - Patient > 75% Assistive device: Walker-platform Max distance: 45f   Walk 10 feet activity   Assist     Assist level: Minimal Assistance - Patient > 75% Assistive device: Walker-platform   Walk 50 feet activity   Assist Walk 50 feet with 2 turns activity did not occur: Safety/medical concerns (fatigue and generalized weakness)         Walk 150 feet activity   Assist Walk 150 feet activity did not occur: Safety/medical concerns (fatigue and generalized weakness)         Walk 10 feet on uneven surface  activity   Assist     Assist level: Moderate Assistance - Patient - 50 - 74% Assistive device: Walker-platform   Wheelchair     Assist Is the patient using a wheelchair?: Yes Type of Wheelchair: Manual    Wheelchair assist level: Dependent - Patient 0%      Wheelchair 50 feet with 2 turns activity    Assist        Assist Level: Dependent - Patient 0%   Wheelchair 150 feet activity     Assist      Assist Level: Dependent - Patient 0%   Blood pressure (!) 145/90, pulse 65, temperature 97.7 F (36.5 C), resp. rate 15, height '5\' 6"'$  (1.676 m), weight 56.5 kg, SpO2 93 %.  Medical Problem List and Plan: 1. Functional deficits secondary to right cerebellar hemispheric punctate focus of acute ischemia             -patient may  shower             -ELOS/Goals: 7-10 days PT/OT Sup, SLP Mod I              1,000U daily  vitamin D started  CT Head ordered for AMS and was stable.  2.  Impaired mobility: continue Lovenox since ambulating 25 feet, Mod A sit to stand             -antiplatelet therapy: Aspirin 81 mg daily and Plavix 75 mg daily x 3 weeks then aspirin alone 3. Pain Management: Tylenol as needed 4. Mood/Behavior/Sleep: Aricept 5 mg daily, Lexapro 20 mg daily             -antipsychotic agents: N/A 5. Neuropsych/cognition: This patient is capable of making decisions on her own behalf. Oriented with cues to date , remembered B day yesterday  6. Skin/Wound Care: Routine skin checks 7.  Fluids/Electrolytes/Nutrition: Routine in and outs with follow-up chemistries 8.  Hypertension.  Cozaar 50 mg daily.  Monitor with increased mobility. Has been well controlled. Klor increased to 24mq daily.  9.  Hyperlipidemia.  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  [facility]  Continue Deutetrabenazine. Discussed with daughter, she will bring her medication in from home 159  Tobacco use.  NicoDerm patch.  Provide counseling 12.  Incidental findings of right thyroid nodule as well as 1.6 cm left upper lobe groundglass opacity.  Follow-up outpatient. 13. Dysphagia, Continue Dys 3 diet, SLP eval [place]. [place] 15. Cough, recheck CXR 16. Right wrist distal radius fracture. [facility] Non weight-bearing on the right wrist , long arm cast  17 Thrush. Order nystatin swish and swallow ordered 18. Suboptimal potassium: klor increased to 461m daily 19. AMS: WBC obtained and stable    LOS: 3 days A FACE TO FACE EVALUATION WAS PERFORMED  Lemarcus Baggerly P Primrose Oler 10/07/2022, 1:32 PM

## 2022-10-08 ENCOUNTER — Inpatient Hospital Stay (HOSPITAL_COMMUNITY): Payer: Medicare Other

## 2022-10-08 LAB — BASIC METABOLIC PANEL
Anion gap: 8 (ref 5–15)
BUN: 26 mg/dL — ABNORMAL HIGH (ref 8–23)
CO2: 27 mmol/L (ref 22–32)
Calcium: 9 mg/dL (ref 8.9–10.3)
Chloride: 109 mmol/L (ref 98–111)
Creatinine, Ser: 0.84 mg/dL (ref 0.44–1.00)
GFR, Estimated: >60 mL/min (ref >60)
Glucose, Bld: 88 mg/dL (ref 70–99)
Potassium: 3.7 mmol/L (ref 3.5–5.1)
Sodium: 144 mmol/L (ref 135–145)

## 2022-10-08 LAB — URINE CULTURE: Culture: NO GROWTH

## 2022-10-08 MED ORDER — FLUCONAZOLE 100 MG PO TABS
400.0000 mg | ORAL_TABLET | Freq: Once | ORAL | Status: AC
  Administered 2022-10-08: 400 mg via ORAL
  Filled 2022-10-08: qty 4

## 2022-10-08 MED ORDER — BOOST PLUS PO LIQD
237.0000 mL | Freq: Two times a day (BID) | ORAL | Status: DC
  Administered 2022-10-08 – 2022-10-17 (×17): 237 mL via ORAL
  Filled 2022-10-08 (×22): qty 237

## 2022-10-08 MED ORDER — SODIUM CHLORIDE 0.9 % IV BOLUS
250.0000 mL | Freq: Once | INTRAVENOUS | Status: AC
  Administered 2022-10-08: 250 mL via INTRAVENOUS

## 2022-10-08 MED ORDER — FLUCONAZOLE 100 MG PO TABS
200.0000 mg | ORAL_TABLET | Freq: Every day | ORAL | Status: AC
  Administered 2022-10-09 – 2022-10-14 (×6): 200 mg via ORAL
  Filled 2022-10-08 (×6): qty 2

## 2022-10-08 MED ORDER — ADULT MULTIVITAMIN W/MINERALS CH
1.0000 | ORAL_TABLET | Freq: Every day | ORAL | Status: DC
  Administered 2022-10-08 – 2022-10-18 (×11): 1 via ORAL
  Filled 2022-10-08 (×11): qty 1

## 2022-10-08 NOTE — Progress Notes (Signed)
Met with patient and daughter. Discussed binder. Stroke. Stroke medications. Ortho came in to replace cast. Will try and see again but didn't seem to have any questions concerns at this time. MD had been in earlier when attempted to see patient.

## 2022-10-08 NOTE — Progress Notes (Signed)
PROGRESS NOTE   Subjective/Complaints: Hematuria this morning, could be from UTI, will discuss positive UA with patient's daughter this AM as she has several antibiotic allergies. Daughter also concerned about rising BUN  ROS: denies pain, +cough, +hemauria as per nursing  Objective:   DG Chest 2 View  Result Date: 10/07/2022 CLINICAL DATA:  Altered mental status EXAM: CHEST - 2 VIEW COMPARISON:  Chest x-ray 10/04/2022 FINDINGS: Surgical changes and scarring in the left lung appear stable. Bilateral axillary surgical clips are again noted. There is no focal lung infiltrate, pleural effusion or pneumothorax. The cardiomediastinal silhouette is within normal limits. Midthoracic compression deformity appears unchanged. IMPRESSION: No active cardiopulmonary disease. Electronically Signed   By: Ronney Asters M.D.   On: 10/07/2022 15:15   CT HEAD WO CONTRAST (5MM)  Result Date: 10/07/2022 CLINICAL DATA:  Interval decline in function status post stroke. EXAM: CT HEAD WITHOUT CONTRAST TECHNIQUE: Contiguous axial images were obtained from the base of the skull through the vertex without intravenous contrast. RADIATION DOSE REDUCTION: This exam was performed according to the departmental dose-optimization program which includes automated exposure control, adjustment of the mA and/or kV according to patient size and/or use of iterative reconstruction technique. COMPARISON:  10/01/22 FINDINGS: Brain: No evidence of acute infarction, hemorrhage, hydrocephalus, extra-axial collection or mass lesion/mass effect. Remote infarct within the left cerebellar hemisphere is unchanged, image 9/3. There is mild diffuse low-attenuation within the subcortical and periventricular white matter compatible with chronic microvascular disease. Prominence of sulci and ventricles compatible with brain atrophy. Vascular: No hyperdense vessel or unexpected calcification. Skull:  Normal. Negative for fracture or focal lesion. Sinuses/Orbits: No acute finding. Other: None. IMPRESSION: 1. No acute intracranial abnormalities. 2. Chronic microvascular disease and brain atrophy. 3. Remote left cerebellar hemisphere infarct. Electronically Signed   By: Kerby Moors M.D.   On: 10/07/2022 12:12   Recent Labs    10/07/22 1053  WBC 6.4  HGB 13.4  HCT 41.7  PLT 245   Recent Labs    10/07/22 1053 10/08/22 0531  NA 142 144  K 3.3* 3.7  CL 105 109  CO2 28 27  GLUCOSE 116* 88  BUN 26* 26*  CREATININE 0.93 0.84  CALCIUM 9.3 9.0    Intake/Output Summary (Last 24 hours) at 10/08/2022 1127 Last data filed at 10/08/2022 0648 Gross per 24 hour  Intake 220 ml  Output --  Net 220 ml         Physical Exam: Vital Signs Blood pressure (!) 137/94, pulse 64, temperature 98.3 F (36.8 C), temperature source Oral, resp. rate 16, height '5\' 6"'$  (1.676 m), weight 56.5 kg, SpO2 94 %.   General: No acute distress, BMI 20.1 Mood and affect are appropriate HEENT: drawn facies Heart: Regular rate and rhythm no rubs murmurs or extra sounds Lungs: Clear to auscultation, breathing unlabored, no rales or wheezes Abdomen: Positive bowel sounds, soft nontender to palpation, nondistended Extremities: No clubbing, cyanosis, or edema Skin: No evidence of breakdown, no evidence of rash Neurologic: Cranial nerves II through XII intact, motor strength is 4/5 in left deltoid, bicep, tricep, grip, hip flexor, knee extensors, ankle dorsiflexor and plantar flexor  Cerebellar exam normal finger  to nose to finger as well as heel to shin in LEFT upper and lower extremities RUE NT due to long arm cast  Musculoskeletal: Full range of motion in all 4 extremities. No joint swelling, RIght elbow in long arm cast  ModA for sit to stand   Assessment/Plan: 1. Functional deficits which require 3+ hours per day of interdisciplinary therapy in a comprehensive inpatient rehab setting. Physiatrist is  providing close team supervision and 24 hour management of active medical problems listed below. Physiatrist and rehab team continue to assess barriers to discharge/monitor patient progress toward functional and medical goals  Care Tool:  Bathing    Body parts bathed by patient: Right arm, Chest, Abdomen, Front perineal area, Right upper leg, Left upper leg, Face, Right lower leg   Body parts bathed by helper: Left arm, Buttocks     Bathing assist Assist Level: Moderate Assistance - Patient 50 - 74%     Upper Body Dressing/Undressing Upper body dressing   What is the patient wearing?: Pull over shirt    Upper body assist Assist Level: Supervision/Verbal cueing    Lower Body Dressing/Undressing Lower body dressing      What is the patient wearing?: Underwear/pull up, Pants     Lower body assist Assist for lower body dressing: Moderate Assistance - Patient 50 - 74%     Toileting Toileting    Toileting assist Assist for toileting: Moderate Assistance - Patient 50 - 74%     Transfers Chair/bed transfer  Transfers assist     Chair/bed transfer assist level: Minimal Assistance - Patient > 75%     Locomotion Ambulation   Ambulation assist      Assist level: Minimal Assistance - Patient > 75% Assistive device: Walker-platform Max distance: 89f   Walk 10 feet activity   Assist     Assist level: Minimal Assistance - Patient > 75% Assistive device: Walker-platform   Walk 50 feet activity   Assist Walk 50 feet with 2 turns activity did not occur: Safety/medical concerns (fatigue and generalized weakness)         Walk 150 feet activity   Assist Walk 150 feet activity did not occur: Safety/medical concerns (fatigue and generalized weakness)         Walk 10 feet on uneven surface  activity   Assist     Assist level: Moderate Assistance - Patient - 50 - 74% Assistive device: Walker-platform   Wheelchair     Assist Is the patient  using a wheelchair?: Yes Type of Wheelchair: Manual    Wheelchair assist level: Dependent - Patient 0%      Wheelchair 50 feet with 2 turns activity    Assist        Assist Level: Dependent - Patient 0%   Wheelchair 150 feet activity     Assist      Assist Level: Dependent - Patient 0%   Blood pressure (!) 137/94, pulse 64, temperature 98.3 F (36.8 C), temperature source Oral, resp. rate 16, height '5\' 6"'$  (1.676 m), weight 56.5 kg, SpO2 94 %.  Medical Problem List and Plan: 1. Functional deficits secondary to right cerebellar hemispheric punctate focus of acute ischemia             -patient may  shower             -ELOS/Goals: 7-10 days PT/OT Sup, SLP Mod I              1,000U daily vitamin D started  CT Head ordered for AMS and was stable. Discussed with daughter 2.  Impaired mobility: continue Lovenox since ambulating 25 feet, Mod A sit to stand             -antiplatelet therapy: Aspirin 81 mg daily and Plavix 75 mg daily x 3 weeks then aspirin alone 3. Pain Management: Tylenol as needed 4. Mood/Behavior/Sleep: Aricept 5 mg daily, Lexapro 20 mg daily             -antipsychotic agents: N/A 5. Neuropsych/cognition: This patient is capable of making decisions on her own behalf. Oriented with cues to date , remembered B day yesterday  6. Skin/Wound Care: Routine skin checks 7. Fluids/Electrolytes/Nutrition: Routine in and outs with follow-up chemistries 8.  Hypertension.  Cozaar 50 mg daily.  Monitor with increased mobility. Has been well controlled. Klor increased to 41mq daily.  9.  Hyperlipidemia.  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  [facility]  Continue Deutetrabenazine. Discussed with daughter, she will bring her medication in from home 174  Tobacco use.  NicoDerm patch.  Provide counseling 12.  Incidental findings of right thyroid nodule as well as 1.6 cm left upper lobe groundglass opacity.  Follow-up outpatient. Discussed  follow-up in hospital with daughter given her concerns for infectious process, discussion is ongoing 159 Dysphagia, Continue Dys 3 diet, SLP eval 14. [place] aricept 15. Cough, recheck CXR 16. Right wrist distal radius fracture. Followed by [facility] Non weight-bearing on the right wrist , long arm cast  17 Thrush. Order nystatin swish and swallow ordered 18. Suboptimal potassium: klor increased to 459m daily 19. AMS: WBC obtained and stable 20. Rising BUN: discussed fluids with daughter, discussion is ongoing 2142Positive UA: discussed starting antibiotic, await UC first given her allergies    LOS: 4 days A FACE TO FACE EVALUATION WAS PERFORMED  KrMartha Clan Armand Preast 10/08/2022, 11:27 AM

## 2022-10-08 NOTE — Progress Notes (Signed)
Pharmacy Antibiotic Note  Barbara Krause is a 81 y.o. female admitted on 10/04/2022 with functional deficits due to right cerebellar ischemic stroke now with concerns of thrush. Pharmacy has been consulted for fluconazole dosing.  Plan: Fluconazole '400mg'$  PO x1, followed by fluconazole '200mg'$  PO daily for total of 7 days Trend WBC, fever, renal function F/u cultures, clinical progress   Height: '5\' 6"'$  (167.6 cm) Weight: 56.5 kg (124 lb 9 oz) IBW/kg (Calculated) : 59.3  Temp (24hrs), Avg:98.3 F (36.8 C), Min:98.2 F (36.8 C), Max:98.4 F (36.9 C)  Recent Labs  Lab 10/01/22 1442 10/02/22 0105 10/05/22 0604 10/06/22 0810 10/07/22 1053 10/08/22 0531  WBC 8.1  --  7.0  --  6.4  --   CREATININE 0.86 0.77 0.78 0.89 0.93 0.84    Estimated Creatinine Clearance: 48.4 mL/min (by C-G formula based on SCr of 0.84 mg/dL).    Allergies  Allergen Reactions   Penicillin G Hives   Bactrim [Sulfamethoxazole-Trimethoprim] Rash    Thank you for allowing pharmacy to be a part of this patient's care.  Ardyth Harps, PharmD Clinical Pharmacist

## 2022-10-08 NOTE — Plan of Care (Deleted)
Behavioral Plan   Behavior to decrease/ eliminate: frustration/physical agitation  Changes to environment:  -minimize environmental stimuli  -keep door closed when girlfriend is present   Interventions: -timed toileting   -girlfriend should be present -minimize amount of staff present -minimize distractions -use simple pictures to communicate  Recommendations for interactions with patient: -provide simple, direct commands and allow time to process  Attendees:  Becky Sax PT, DPT  [place]

## 2022-10-08 NOTE — Progress Notes (Signed)
Occupational Therapy Session Note  Patient Details  Name: Barbara Krause MRN: [id number] Date of Birth: 01-11-1941  Today's Date: 10/08/2022 OT Individual Time: 0800-0900 OT Individual Time Calculation (min): 60 min    Short Term Goals: Week 1:  OT Short Term Goal 1 (Week 1): STG = LTG due to ELOS  Skilled Therapeutic Interventions/Progress Updates:  Skilled OT intervention completed with focus on ADL retraining and oral care with education on aspiration risks. Pt received upright in bed, agreeable to session. No pain reported. Pt indicated she is having a better day today and feels more energized.   Transitioned to EOB with CGA, scooting towards EOB with CGA. Nursing in room to administer meds, with pt demonstrating increased difficulty with swallowing and consuming with several episodes of aspiration. Therapist encouraged strong cough and use of provale cup to clear throat, with pt indicating the taste of the crushed meds make her want to vomit. Required increased time to consume all meds.  Completed doffing/donning of shirt with supervision, with good recall of hemi-technique. Min A sit > stands using R PFRW, with initial posterior push however able to correct position with CGA and min cues. Doffed brief with min A. Completed threading of BLE with underwear/pants with min A, with min A as well to donn over hips in stance.   Completed min A stand pivot using R PFRW with tactile cues given for advancement of BLE but no verbal cues to allow pt time to process transfer and motor planning with pt appearing to benefit from this.  Per SLP instructions, had pt complete oral care with use of suction toothbrush, in which pt verbalized not liking. Education provided on purpose with aspiration precautions, however reported to SLP of pt's preferences. Min A needed for oral care, with pt requesting to rinse mouth with cup of water. Advised against due to the nature of the aspiration however on first rinse  did not have problem, however 2nd "rinse" pt intentionally swallowed stating she was thirsty and had big episode of aspiration requiring cues for strong coughs.  Pt remained seated in w/c, with belt alarm on/activated, and with all needs in reach at end of session.    Therapy Documentation Precautions:  Precautions Precautions: Fall Restrictions Weight Bearing Restrictions: Yes RUE Weight Bearing: Non weight bearing Other Position/Activity Restrictions: RT Wrist fracture ~3 weeks ago    Therapy/Group: Individual Therapy  Blase Mess, MS, OTR/L  10/08/2022, 10:47 AM

## 2022-10-08 NOTE — Progress Notes (Signed)
PA Dan notified of pt hematuria this AM. No new orders at this time. Sheela Stack, LPN

## 2022-10-08 NOTE — Progress Notes (Signed)
Physical Therapy Session Note  Patient Details  Name: Barbara Krause MRN: [id number] Date of Birth: 02-17-1941  Today's Date: 10/08/2022 PT Individual Time: 1100-1145 and 1415-1456 PT Individual Time Calculation (min): 45 min  and 41 min Today's Date: 10/08/2022 PT Missed Time: 15 Minutes Missed Time Reason: Other (Comment) (meeting with PA)  Short Term Goals: Week 1:  PT Short Term Goal 1 (Week 1): pt will transfer sit<>stand with LRAD and CGA PT Short Term Goal 2 (Week 1): pt will transfer bed<>chair with LRAD and CGA PT Short Term Goal 3 (Week 1): pt will ambulate 43f with LRAD and CGA  Skilled Therapeutic Interventions/Progress Updates:   Treatment Session 1 [facility] and PA, Dan, in room. [name] daughter and DLinna Hoffhaving discussion regarding imaging and PT allowed time for the two to discuss. Upon returning, pt agreeable to PT treatment and denied any pain during session. Session with emphasis on functional mobility/transfers, generalized strengthening and endurance, dynamic standing balance/coordination, and gait training. [name] daughter asking if cast on RUE could be trimmed for comfort - PA notified and ortho planning on trimming cast today. [name] daughter requested to be cleared to assist with transfers and participated in hands on training with stand<>pivot transfers. [name] daughter demonstrates good understanding of body mechanics, hand placement, and transfer technique - safety plan updated.   [facility] for time management and energy conservation purposes. Stood with R PFRW and mod A (due to continued posterior lean) and pt ambulated 1814fwith R PFRW and CGA/light min A. Pt demonstrated improvements in gait today and was able to advance RW without assist from therapist, only bumping into 1 obstacle on L side. However, upon returning to [facility] problem solving how to turn and mange RW to get to [facility] requiring mod A to steer RW. Pt conitnues to push RW too far forward but demonstrated improvements in shuffling gait with max cues to take "larger steps". Pt then stood from [facility] R PFRW and min A and performed alternating toe taps to 3in step 2x20 reps with CGA for balance. Pt then worked on blocked practice sit<>stands with min/mod A x 5 reps with emphasis on anterior weight shifiting to correct posterior lean. Returned to room and concluded session with pt sitting in [facility] all needs within reach and daughter present at bedside. 15 minutes missed of skilled physical therapy due to discussion with PA.   Treatment Session 2 Received pt sidelying in bed with daughter present at bedside. Pt agreeable to PT treatment and denied any pain during session. Session with emphasis on functional mobility/transfers, generalized strengthening and endurance, dynamic standing balance/coordination, and gait training. Pt transferred sidelying<>sitting EOB with HOB slightly elevated and supervision and transferred bed<>WC stand<>pivot without AD and min A. Pt transported to/from [facility] in [facility] for time management and energy conservation purposes. Pt transferred on/off Nustep via stand<>pivot with R PFRW and mod A to stand and min A for stand<>pivot with assist and cues for sequencing when turning. Pt performed seated BLE strengthening on Nustep at workload 2 for 10 minutes for a total of 312 steps with emphasis on LE strength and cardiovascular endurance. Provided pt with HEP and educated pt on frequency/duration/technique for the following exercises: - Supine Bridge  - 1 x daily - 7 x weekly - 3 sets - 10 reps - Clamshell  - 1 x daily - 7 x weekly - 3 sets -  15 reps - Supine Active Straight Leg Raise  - 1 x daily - 7 x weekly - 3 sets - 10 reps - Seated Long Arc Quad  - 1 x daily - 7 x weekly - 3 sets - 15 reps - Seated March  - 1 x daily - 7 x weekly - 3 sets - 15 reps - Seated Hip Adduction Isometrics with  Ball  - 1 x daily - 7 x weekly - 3 sets - 10 reps - Sidelying Reverse Clamshell  - 1 x daily - 7 x weekly - 3 sets - 15 reps Pt then stood with R PFRW and min A and ambulated 133f with R FPRW and CGA back to room. Pt with multiple "freezing" episodes, shuffling gait with increased fatigue, and tendency to push RW too far forwards. Pt requested to return to bed and transferred sit<>supine with supervision. Concluded session with pt semi-reclined in bed, needs within reach, and bed alarm on.   Therapy Documentation Precautions:  Precautions Precautions: Fall Restrictions Weight Bearing Restrictions: Yes RUE Weight Bearing: Non weight bearing Other Position/Activity Restrictions: RT Wrist fracture ~3 weeks ago  Therapy/Group: Individual Therapy AAlfonse AlpersPT, DPT  10/08/2022, 7:16 AM

## 2022-10-08 NOTE — Progress Notes (Signed)
[facility] Hospital Liaison note:  This patient is currently enrolled in [facility] outpatient-based Palliative Care. Will continue to follow for disposition.  Please call with any outpatient palliative questions or concerns.  Thank you, Dee Curry, LPN [facility] Hospital Liaison [phone] 

## 2022-10-08 NOTE — Progress Notes (Addendum)
Speech Language Pathology Daily Session Note  Patient Details  Name: Barbara Krause MRN: [id number] Date of Birth: 02/16/1941  Today's Date: 10/08/2022 SLP Individual Time: 0915-1000 SLP Individual Time Calculation (min): 45 min  Short Term Goals: Week 1: SLP Short Term Goal 1 (Week 1): Pt will tolerate Dysphagia 3 textures and thin liquids with stable medical status given Min A for adherence to aspiration precautions. SLP Short Term Goal 2 (Week 1): Pt will utilize speech intelligibility strategies to improve functional communicaiton and intelligibility to 90% with Min A verbal cues. SLP Short Term Goal 3 (Week 1): With use of external aids, pt will recall and implement aspiration precautions on 90% of PO trials without additional cues.  Skilled Therapeutic Interventions: Pt seen this date for skilled ST intervention targeting swallowing goals outlined above. Pt received OOB in w/c and awake, though appearing fatigued. Agreeable to intervention in hospital room. OT reports difficulty with swishing and spitting during oral care this morning, with overt aspiration observed.  SLP facilitated ongoing skilled education re: aspiration precautions, risks/ramifications of aspiration, EMST guidelines, results from MBSS completed on 12/8, management of oral secretions/saliva, importance of oral care (suction toothbrush vs sink - pt prefers sink despite education on use of suction toothbrush). and volitional cough response to mitigate aspiration complications. Pt verbalized that she feels her swallow function has deteriorated since this admission, and reports she is "having trouble swallowing" her own saliva. Pt observed with anterior oral spillage, pooling of saliva, and "freezing" when attempting to produce swallow response.  Pt required intermittent verbal cues to swallow saliva that was building in her mouth, and benefited from intermittent oral suctioning + wiping mouth with washcloth to correct/reduce  instances of drooling. Coughing and wet vocal quality noted on pt's saliva/secretions and during PO trials, with pt benefiting from verbal cues to clear wet vocal quality.   Pt consumed thin liquid via 5 cc Provale cup with consistent cough response after each sip, that appeared to result in discomfort for pt. Trialed Ensure via straw, at pt's request, with no cough response noted, though per MBSS silent aspiration is occurring. Min-Mod A, faded to Sup A verbal cues provided to remind pt to produce volitional cough response after all PO trials, or at the very least every 2-3  bite/sips. Written reminder provided for pt and placed on bedside table. Pt reports she is not eating and is only drinking nutritional supplements at this point in time due to difficulties. Benefited from Min-Mod A verbal cues to increase vocal intensity at the phrase level with pt only able to correct and implement intermittently. Recalled MBSS findings with Sup A. Per chart review, VSS and WBC WNL, no respiratory complications noted at this point with CXR from yesterday negative for acute findings. Conitnue to monitor vitals and clinical presentation given silent aspiration and high risk for developing complications from aspiration. Fortunately, pt is ambulatory and does participate in oral care routine BID. EMST activation noted at 5 and 10 cm H2O given Max A multimodal cues for motor planning/execution of expiration into device (2 sets of 5 reps completed).  Pt left in room with NT present for supervision for pt to finish her Ensure. Continue per current ST POC. Pt continues to express desire to remain eating by mouth without alternate means of nutrition/hydration despite new challenges; therefore, continue to recommend Palliative Care consult to review goals of care given acute on chronic findings on MBSS - MD notified and stated she would d/w pt's daughter. May provide  thin liquids via cup and/or straw, for comfort, given  encouragement to produce volitional cough response as often as possible and upright positioning for all meals; interdisciplinary team made aware.   Pain None reported; NAD  Therapy/Group: Individual Therapy  Jaclyn Carew A Arihanna Estabrook 10/08/2022, 12:37 PM

## 2022-10-08 NOTE — Progress Notes (Signed)
Orthopedic Tech Progress Note Patient Details:  Barbara Krause 03-05-1941 [id number]  Casting Type of Cast: Long arm cast Cast Location: RUE Cast Material: Fiberglass Cast Intervention: Re-application, Application  Post Interventions Patient Tolerated: Well Instructions Provided: Care of device  Patients daughter requested that original cast be trimmed down due to hematoma and skin breakdown, reached out to MD Leretha Pol at [facility] [place] to notify her of this. Received a verbal order to apply a new cast due to delayed healing of the patients break. New cast applied, slightly trimmed down near the metacarpals at the daughters request. Mole skin also applied around edges to protect against and further skin irritation.  Before applying the new cast, the RN and charge RN were notified of the skin break down caused by the previous cast.  Vernona Rieger 10/08/2022, 5:24 PM

## 2022-10-08 NOTE — Progress Notes (Signed)
Initial Nutrition Assessment  DOCUMENTATION CODES:   Non-severe (moderate) malnutrition in context of chronic illness  INTERVENTION:   Boost Plus po BID, each supplement provides 360 kcal and 14 grams of protein Multivitamin w/ minerals daily Discontinue calorie count Consider adding bowel regimen due to no BM in >5 days.   NUTRITION DIAGNOSIS:   Moderate Malnutrition related to chronic illness as evidenced by severe muscle depletion, moderate fat depletion.  GOAL:   Patient will meet greater than or equal to 90% of their needs  MONITOR:   Labs, PO intake, Weight trends, I & O's, Supplement acceptance  REASON FOR ASSESSMENT:   Consult Calorie Count  ASSESSMENT:   81 y.o. 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 PMH includes Huntington's disease, MDD, osteoporosis, breast cancer s/p bilateral mastectomy, HTN, dysphagia, malnutrition, and tobacco abuse.   Per H&P, pt from home alone and has a HHA 7 days per week for 4 hours/day.   Met with pt and daughter in room. Pt reports that she typically drinks two Boost per day at home and has one meal in the evening. Daughter shares that she will eat 3 small pastries in the morning, then the two boost around 1 & 3 pm. Both deny any weight loss, report that pt has been able to gain weight over the past year.  Daughter reports that pt had difficulty chewing chopped chicken last night and just held mashed sweet potatoes in her mouth (no signs of pocketing). Per chart review, pt with MBS yesterday. Noted that pt wants to avoid alternative means of nutrition short and long-term. SLP recommend NPO, but pt prefers least restrictive diet. Pt placed on Dysphagia 3 diet.  Discussed that RD will adjust supplements and order pt two Boost plus like she was already doing at home. RD reviewed menu with daughter and pt, explaining alternative options available.   Meal Intake 12/05-12/07: 5-60% x 4 meals (average 33%)  Medications  reviewed and include: Diflucan, Magnesium Gluconate, MVI, Potassium Chloride Labs reviewed: Potassium 3.7, Hgb A1c 5.8%   NUTRITION - FOCUSED PHYSICAL EXAM:  Flowsheet Row Most Recent Value  Orbital Region Moderate depletion  Upper Arm Region Moderate depletion  Thoracic and Lumbar Region Moderate depletion  Buccal Region Moderate depletion  Temple Region Moderate depletion  Clavicle Bone Region Severe depletion  Clavicle and Acromion Bone Region Severe depletion  Scapular Bone Region Severe depletion  Dorsal Hand Mild depletion  Patellar Region Severe depletion  Anterior Thigh Region Severe depletion  Posterior Calf Region Severe depletion  Edema (RD Assessment) None  Hair Reviewed  Eyes Reviewed  Mouth Reviewed  Skin Reviewed  Nails Reviewed   Diet Order:   Diet Order             DIET DYS 3 Room service appropriate? Yes; Fluid consistency: Thin  Diet effective now                   EDUCATION NEEDS:   Education needs have been addressed  Skin:  Skin Assessment: Reviewed RN Assessment  Last BM:  12/3  Height:   Ht Readings from Last 1 Encounters:  10/04/22 _0  (1.676 m)    Weight:   Wt Readings from Last 1 Encounters:  10/04/22 56.5 kg    Ideal Body Weight:  59.1 kg  BMI:  Body mass index is 20.1 kg/m.  Estimated Nutritional Needs:   Kcal:  1700-1900  Protein:  85-100 grams  Fluid:  >/= 1.7 L  Hermina Barters RD, LDN Clinical Dietitian See Shea Evans for contact information.

## 2022-10-09 NOTE — Progress Notes (Signed)
PROGRESS NOTE   Subjective/Complaints: No new issues , pt denies pains, abd issues or breathing issues   ROS: denies pain, +cough, +hemauria as per nursing  Objective:   CT CHEST WO CONTRAST  Result Date: 10/08/2022 CLINICAL DATA:  Chronic cough for several weeks EXAM: CT CHEST WITHOUT CONTRAST TECHNIQUE: Multidetector CT imaging of the chest was performed following the standard protocol without IV contrast. RADIATION DOSE REDUCTION: This exam was performed according to the departmental dose-optimization program which includes automated exposure control, adjustment of the mA and/or kV according to patient size and/or use of iterative reconstruction technique. COMPARISON:  Chest x-ray from the previous day as well as CT of the chest from 02/18/2022 FINDINGS: Cardiovascular: Atherosclerotic calcifications of the thoracic aorta are noted. Dilatation of the ascending aorta to 4.2 cm is again identified stable from the prior exam. Pulmonary artery appears within normal limits. Coronary calcifications are noted. No cardiac enlargement is seen. Mediastinum/Nodes: Thoracic inlet shows calcified thyroid nodule on the right stable from the prior exam. No hilar or mediastinal adenopathy is noted. The esophagus as visualized is within normal limits. Lungs/Pleura: Lungs are mildly hyperinflated. Bibasilar scarring is seen slightly worse on the left than the right. No confluent infiltrate is seen. Apical scarring is noted particularly on the left. Patchy airspace opacity is noted in the left upper lobe laterally best seen on image number 65 of series 4 likely related to early infiltrate. No sizable effusion is noted. Changes of bronchiectasis in the lower lobes is seen. Upper Abdomen: Cholecystectomy changes are noted. No other focal abnormality in the upper abdomen is seen. Musculoskeletal: Degenerative changes of the thoracic spine are noted. Bilateral breast  implants are seen and stable. Multiple chronic compression deformities are noted within the thoracic spine stable in appearance from the prior exam. 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 Recommend annual imaging followup by CTA or MRA. This recommendation follows 2010 ACCF/AHA/AATS/ACR/ASA/SCA/SCAI/SIR/STS/SVM Guidelines for the Diagnosis and Management of Patients with Thoracic Aortic Disease. Circulation. 2010; 121: W263-Z858. Aortic aneurysm NOS (ICD10-I71.9) Patchy airspace opacity in the left upper lobe laterally likely representing early infiltrate. Short-term follow-up in 3 months is recommended. Stable calcified right thyroid nodule. Not clinically significant; no follow-up imaging recommended (ref: J Am Coll Radiol. 2015 Feb;12(2): 143-50). Aortic Atherosclerosis (ICD10-I70.0) and Emphysema (ICD10-J43.9). Electronically Signed   By: Inez Catalina M.D.   On: 10/08/2022 20:12   DG Chest 2 View  Result Date: 10/07/2022 CLINICAL DATA:  Altered mental status EXAM: CHEST - 2 VIEW COMPARISON:  Chest x-ray 10/04/2022 FINDINGS: Surgical changes and scarring in the left lung appear stable. Bilateral axillary surgical clips are again noted. There is no focal lung infiltrate, pleural effusion or pneumothorax. The cardiomediastinal silhouette is within normal limits. Midthoracic compression deformity appears unchanged. IMPRESSION: No active cardiopulmonary disease. Electronically Signed   By: Ronney Asters M.D.   On: 10/07/2022 15:15   Recent Labs    10/07/22 1053  WBC 6.4  HGB 13.4  HCT 41.7  PLT 245    Recent Labs    10/07/22 1053 10/08/22 0531  NA 142 144  K 3.3* 3.7  CL 105 109  CO2  28 27  GLUCOSE 116* 88  BUN 26* 26*  CREATININE 0.93 0.84  CALCIUM 9.3 9.0     Intake/Output Summary (Last 24 hours) at 10/09/2022 1202 Last data filed at 10/08/2022 1900 Gross per 24 hour  Intake 250.87 ml  Output --  Net 250.87 ml       Pressure Injury 10/08/22  Hand Posterior;Right Stage 2 -  Partial thickness loss of dermis presenting as a shallow open injury with a red, pink wound bed without slough. open wound found under cast on right hand (Active)  10/08/22 1640  Location: Hand  Location Orientation: Posterior;Right  Staging: Stage 2 -  Partial thickness loss of dermis presenting as a shallow open injury with a red, pink wound bed without slough.  Wound Description (Comments): open wound found under cast on right hand  Present on Admission: Yes    Physical Exam: Vital Signs Blood pressure (!) 161/94, pulse 71, temperature 98.3 F (36.8 C), temperature source Oral, resp. rate 16, height '5\' 6"'$  (1.676 m), weight 56.5 kg, SpO2 97 %.   General: No acute distress, BMI 20.1 Mood and affect are appropriate HEENT: drawn facies Heart: Regular rate and rhythm no rubs murmurs or extra sounds Lungs: Clear to auscultation, breathing unlabored, no rales or wheezes Abdomen: Positive bowel sounds, soft nontender to palpation, nondistended Extremities: No clubbing, cyanosis, or edema Skin: No evidence of breakdown, no evidence of rash Neurologic: Cranial nerves II through XII intact, motor strength is 4/5 in left deltoid, bicep, tricep, grip, hip flexor, knee extensors, ankle dorsiflexor and plantar flexor  Cerebellar exam normal finger to nose to finger as well as heel to shin in LEFT upper and lower extremities RUE NT due to long arm cast  Musculoskeletal: Full range of motion in all 4 extremities. No joint swelling, RIght elbow in long arm cast  ModA for sit to stand   Assessment/Plan: 1. Functional deficits which require 3+ hours per day of interdisciplinary therapy in a comprehensive inpatient rehab setting. Physiatrist is providing close team supervision and 24 hour management of active medical problems listed below. Physiatrist and rehab team continue to assess barriers to discharge/monitor patient progress toward functional and medical  goals  Care Tool:  Bathing    Body parts bathed by patient: Right arm, Chest, Abdomen, Front perineal area, Right upper leg, Left upper leg, Face, Right lower leg   Body parts bathed by helper: Left arm, Buttocks     Bathing assist Assist Level: Moderate Assistance - Patient 50 - 74%     Upper Body Dressing/Undressing Upper body dressing   What is the patient wearing?: Pull over shirt    Upper body assist Assist Level: Supervision/Verbal cueing    Lower Body Dressing/Undressing Lower body dressing      What is the patient wearing?: Underwear/pull up, Pants     Lower body assist Assist for lower body dressing: Moderate Assistance - Patient 50 - 74%     Toileting Toileting    Toileting assist Assist for toileting: Moderate Assistance - Patient 50 - 74%     Transfers Chair/bed transfer  Transfers assist     Chair/bed transfer assist level: Minimal Assistance - Patient > 75%     Locomotion Ambulation   Ambulation assist      Assist level: Minimal Assistance - Patient > 75% Assistive device: Walker-platform Max distance: 118f   Walk 10 feet activity   Assist     Assist level: Minimal Assistance - Patient > 75% Assistive device:  Walker-platform   Walk 50 feet activity   Assist Walk 50 feet with 2 turns activity did not occur: Safety/medical concerns (fatigue and generalized weakness)  Assist level: Minimal Assistance - Patient > 75% Assistive device: Walker-platform    Walk 150 feet activity   Assist Walk 150 feet activity did not occur: Safety/medical concerns (fatigue and generalized weakness)  Assist level: Minimal Assistance - Patient > 75% Assistive device: Walker-platform    Walk 10 feet on uneven surface  activity   Assist     Assist level: Moderate Assistance - Patient - 50 - 74% Assistive device: Walker-platform   Wheelchair     Assist Is the patient using a wheelchair?: Yes Type of Wheelchair: Manual     Wheelchair assist level: Dependent - Patient 0%      Wheelchair 50 feet with 2 turns activity    Assist        Assist Level: Dependent - Patient 0%   Wheelchair 150 feet activity     Assist      Assist Level: Dependent - Patient 0%   Blood pressure (!) 161/94, pulse 71, temperature 98.3 F (36.8 C), temperature source Oral, resp. rate 16, height '5\' 6"'$  (1.676 m), weight 56.5 kg, SpO2 97 %.  Medical Problem List and Plan: 1. Functional deficits secondary to right cerebellar hemispheric punctate focus of acute ischemia             -patient may  shower             -ELOS/Goals: 7-10 days PT/OT Sup, SLP Mod I              1,000U daily vitamin D started  CT Head ordered for AMS and was stable. Discussed with daughter 2.  Impaired mobility: continue Lovenox since ambulating 25 feet, Mod A sit to stand             -antiplatelet therapy: Aspirin 81 mg daily and Plavix 75 mg daily x 3 weeks then aspirin alone 3. Pain Management: Tylenol as needed 4. Mood/Behavior/Sleep: Aricept 5 mg daily, Lexapro 20 mg daily             -antipsychotic agents: N/A 5. Neuropsych/cognition: This patient is capable of making decisions on her own behalf. Oriented with cues to date , remembered B day yesterday  6. Skin/Wound Care: Routine skin checks 7. Fluids/Electrolytes/Nutrition: Routine in and outs with follow-up chemistries 8.  Hypertension.  Cozaar 50 mg daily.  Monitor with increased mobility. Has been well controlled. Klor increased to 58mq daily.  Vitals:   10/08/22 2043 10/08/22 2044  BP:  (!) 161/94  Pulse:  71  Resp:  16  Temp: 98.3 F (36.8 C)   SpO2:  97%    9.  Hyperlipidemia.  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  [facility]ed at [facility]  Continue Deutetrabenazine. Discussed with daughter, she will bring her medication in from home 150  Tobacco use.  NicoDerm patch.  Provide counseling 12.  Incidental findings of right thyroid nodule as well as 1.6 cm  left upper lobe groundglass opacity.  Follow-up outpatient. Discussed follow-up in hospital with daughter given her concerns for infectious process, discussion is ongoing 173 Dysphagia, Continue Dys 3 diet, SLP eval 14. [place] On aricept 15. Cough, recheck CXR 16. Right wrist distal radius fracture. Followed by [facility] Non weight-bearing on the right wrist , long arm cast  17 Thrush. Order nystatin swish and swallow ordered 18. Suboptimal potassium: klor increased to 438m  daily 19. AMS: WBC obtained and stable 20. Rising BUN: discussed fluids with daughter, discussion is ongoing 20. Positive UA: discussed starting antibiotic,UCx neg hold off on abx   Had hematuria w/u in 2022 , + nephrolithiasis, no flank pan c/o but may have become symptomatic with anticoag, monitor Hgb   LOS: 5 days A FACE TO FACE EVALUATION WAS PERFORMED  Charlett Blake 10/09/2022, 12:02 PM

## 2022-10-09 NOTE — Progress Notes (Signed)
Occupational Therapy Session Note  Patient Details  Name: Jinan Biggins MRN: [id number] Date of Birth: [birth year]  Today's Date: 10/09/2022 OT Individual Time: 0915-1030 OT Individual Time Calculation (min): 75 min   Today's Date: 10/09/2022 OT Individual Time: 1250-1345 OT Individual Time Calculation (min): 55 min   Short Term Goals: Week 1:  OT Short Term Goal 1 (Week 1): STG = LTG due to ELOS  Skilled Therapeutic Interventions/Progress Updates:     Pt received in bed with no pain  ADL: Pt completes ADL at overall CGA-MIN A Level. Skilled interventions include: cuing for 1 handed sock donning technique, offering use of stool to decrease reach towards feet, statning balance/cuing for advancing pants pasthips with  MIN A, cuing to manage water bolus size with oral care swish/spit, and cuing for managmet of oral secretions. Pt completes UB dressing with A and LB dressing with CGA  Therapeutic activity STS trainging from elevated mat to build eccentric control 2x5  Functional stepping/balance over dowl rod 1x10e LE with HHA with pt percieved difficulty even on BLE with demo 2/10x each LE wihtdifficulty fully clearing and steppin over dowel rod.   Functional mobility with RW and 1.5# ankle weights for overtraining of lfoot clearance with MIN A and improved stepping after without  weights.   Pt left at end of session in recliner with exit alarm on, call light in reach and all needs met  Session 2: Pt received in bed with no pain   Therapeutic activity Focus of session on functional mobility. Pt completes mobility in hallway with CGA-MIN A and PFRW with 1# ankle weights for over correction of small steps throughout entire session. Pt requires mod cuing throughout for stride length. Floor clock completed with flat and cone targets with HHA on LUE. Pt with CGA during LE coordination/banace activities. Shower stall imulated with 4' box to step over with CGA-mIN A mostly for steering RW  during turn. Pt states :"ill probably use my w/c in the bathroom for showers and my daughter will be with me." Use of yellow theraband on way back to room for visual target to step to in conjunction with "marching steps" for pt to pick feet up off floor instead of using calf raise to clear feet from floor.  Pt left at end of session in bed with exit alarm on, call light in reach and all needs met   Therapy Documentation Precautions:  Precautions Precautions: Fall Restrictions Weight Bearing Restrictions: Yes RUE Weight Bearing: Non weight bearing Other Position/Activity Restrictions: RT Wrist fracture ~3 weeks ago  Therapy/Group: Individual Therapy  Tonny Branch 10/09/2022, 6:26 AM

## 2022-10-09 NOTE — Progress Notes (Signed)
Speech Language Pathology Daily Session Note  Patient Details  Name: Venice Marcucci MRN: [id number] Date of Birth: [birth year]  Today's Date: 10/09/2022 SLP Individual Time: 1400-1500 SLP Individual Time Calculation (min): 60 min  Short Term Goals: Week 1: SLP Short Term Goal 1 (Week 1): Pt will tolerate Dysphagia 3 textures and thin liquids with stable medical status given Min A for adherence to aspiration precautions. SLP Short Term Goal 2 (Week 1): Pt will utilize speech intelligibility strategies to improve functional communicaiton and intelligibility to 90% with Min A verbal cues. SLP Short Term Goal 3 (Week 1): With use of external aids, pt will recall and implement aspiration precautions on 90% of PO trials without additional cues.  Skilled Therapeutic Interventions: Skilled intervention focused on speech intelligibility and swallowing. Pt tolerated sips of thin liquid via straw and cup sip. She was able to recall strategies given to couch after swallow. Family and RN reported noticeable increase in coughing with provale cup vs regular cup sips and straw due to patient turning head into upward position in attempt to get more liquid. Pt formualted sentences with increased vocal intensity with mod A visual and verbal models. She transferred from bed to [facility] with mod verbal cues She participated well in session. Cont therapy per plan of care.      Pain Pain Assessment Pain Scale: Faces Faces Pain Scale: No hurt  Therapy/Group: Individual Therapy  Darrol Poke Joeseph [place]erville 10/09/2022, 2:45 PM

## 2022-10-09 NOTE — Progress Notes (Signed)
Patient reports having bowel movement today

## 2022-10-10 NOTE — Progress Notes (Signed)
Physical Therapy Session Note  Patient Details  Name: Barbara Krause MRN: [id number] Date of Birth: [birth year]  Today's Date: 10/10/2022 PT Individual Time: 0900-0955 PT Individual Time Calculation (min): 55 min   Short Term Goals: Week 1:  PT Short Term Goal 1 (Week 1): pt will transfer sit<>stand with LRAD and CGA PT Short Term Goal 2 (Week 1): pt will transfer bed<>chair with LRAD and CGA PT Short Term Goal 3 (Week 1): pt will ambulate 29f with LRAD and CGA   Skilled Therapeutic Interventions/Progress Updates:    Pt received in bathroom handed off from NT for toilet transfer. Pt is min A for stand pivot transfer w/c to/from toilet with use of grab bar. Pt needs assist for LB clothing management with toileting, is setup A for pericare following continent void of urine. Pt returned to hospital room via w/c. Pt is setup A to wash her hands and change her shirt while seated in w/c at sink. Dependent transport via w/c to/from therapy gym for time and energy conservation. Sit to stand with mod A initially to R PFRW with progression to min A for transfer during session. Ambulation x 90 ft with R PFRW and up to mod A needed. Pt with tendency to ambulate with trunk and hips flexed, shuffling gait pattern, and tends to push RW out ahead of her. Pt needs max cueing for upright posture, increasing B step length, and to keep RW closer to her during gait. Standing alt L/R 3" step-taps with R PFRW and min A for balance, 3 x 10 reps with cues needed for upright posture. Standing alt L/R cane stepovers with R PFRW and min A for balance with focus on increasing step length during gait. Pt exhibits most difficulty with SLS on RLE and stepping with LLE with one LOB, needs min A to recover. Pt also needs cues for increased eccentric control when transitioning from stand to sit. Pt returned to room and left seated in recliner with needs in reach at end of session.  Therapy Documentation Precautions:   Precautions Precautions: Fall Restrictions Weight Bearing Restrictions: Yes RUE Weight Bearing: Non weight bearing Other Position/Activity Restrictions: RT Wrist fracture ~3 weeks ago      Therapy/Group: Individual Therapy   TExcell Seltzer PT, DPT, CSRS 10/10/2022, 12:10 PM

## 2022-10-11 MED ORDER — DEUTETRABENAZINE 12 MG PO TABS
1.0000 | ORAL_TABLET | Freq: Two times a day (BID) | ORAL | Status: DC
  Administered 2022-10-11 – 2022-10-12 (×2): 1 via ORAL
  Filled 2022-10-11 (×3): qty 1

## 2022-10-11 NOTE — Progress Notes (Signed)
Occupational Therapy Session Note  Patient Details  Name: Barbara Krause MRN: [id number] Date of Birth: [birth year]  Today's Date: 10/11/2022 OT Individual Time: 0905-1000 OT Individual Time Calculation (min): 55 min    Short Term Goals: Week 1:  OT Short Term Goal 1 (Week 1): STG = LTG due to ELOS  Skilled Therapeutic Interventions/Progress Updates:  Skilled OT intervention completed with focus on functional endurance and ADL retraining within a shower context. Pt received upright in bed, agreeable to session. No pain reported, nursing had just administered meds.  Transitioned to EOB with supervision, scooted towards EOB with CGA then min A sit > stand using R PFRW and min A for ambulatory transfer to tub bench in shower, with physical assist for PFRW management/positioning and max verbal cues for BLE positioning and sequencing with occasional min tactile cues for follow through.  Water proof cover applied to RUE over cast and IV on L arm prior to shower. Able to bathe with overall mod A more so for time constraint due to slow processing/initiation. Min A stand pivot with use of grab bars to w/c. Dressed at overall supervision for UB, min A for LB for threading only with therapist cueing pt with recall of propping legs on stool for increased access however pt preferred to do her normal way. Able to donn over hips with CGA for balance with time using R PFRW.  [name] daughter present, with report of concern about new cast fit, as ortho placed cast longer onto the MCPs to assist with prevention of skin breakdown, however more material present on thenar aspect and rough material on the palmar aspect of the MCPs now. Questionable "tear" or skinfold due to limited visibility, on the thenar aspect of the thumb however discussed with daughter that we could apply moleskin to the cast for skin protection however she reports ortho already tried. Daughter verbalized plan to contact ortho in regards to shorter  cast in which this OT also agrees that a cast below the elbow would enable freedom and ROM needed for ADLs and independence with transfers at the elbow and potentially less restriction around to the palm/thenar space for skin integrity purposes.     Pt remained upright in w/c with daughter present assisting with hair drying, with all needs in reach at end of session.   Therapy Documentation Precautions:  Precautions Precautions: Fall Restrictions Weight Bearing Restrictions: Yes RUE Weight Bearing: Non weight bearing Other Position/Activity Restrictions: RT Wrist fracture ~3 weeks ago    Therapy/Group: Individual Therapy  Blase Mess, MS, OTR/L  10/11/2022, 12:28 PM

## 2022-10-11 NOTE — Progress Notes (Signed)
PROGRESS NOTE   Subjective/Complaints: Patient has no new complaints this morning.  Discussed CT chest with her Daughter has questions about multivitamin, I will call her today  ROS: denies pain, +cough, +hemauria as per nursing  Objective:   No results found. No results for input(s): "WBC", "HGB", "HCT", "PLT" in the last 72 hours. No results for input(s): "NA", "K", "CL", "CO2", "GLUCOSE", "BUN", "CREATININE", "CALCIUM" in the last 72 hours.  Intake/Output Summary (Last 24 hours) at 10/11/2022 1134 Last data filed at 10/11/2022 0804 Gross per 24 hour  Intake 120 ml  Output --  Net 120 ml      Pressure Injury 10/08/22 Hand Posterior;Right Stage 2 -  Partial thickness loss of dermis presenting as a shallow open injury with a red, pink wound bed without slough. device related; open wound found under cast on right hand (Active)  10/08/22 1640  Location: Hand  Location Orientation: Posterior;Right  Staging: Stage 2 -  Partial thickness loss of dermis presenting as a shallow open injury with a red, pink wound bed without slough.  Wound Description (Comments): device related; open wound found under cast on right hand  Present on Admission: Yes    Physical Exam: Vital Signs Blood pressure 128/72, pulse 66, temperature 97.7 F (36.5 C), resp. rate 17, height '5\' 6"'$  (1.676 m), weight 56.5 kg, SpO2 95 %.   General: No acute distress, BMI 20.1 Mood and affect are appropriate HEENT: drawn facies Heart: Regular rate and rhythm no rubs murmurs or extra sounds Lungs: Clear to auscultation, breathing unlabored, no rales or wheezes Abdomen: Positive bowel sounds, soft nontender to palpation, nondistended Extremities: No clubbing, cyanosis, or edema Skin: No evidence of breakdown, no evidence of rash Neurologic: Cranial nerves II through XII intact, motor strength is 4/5 in left deltoid, bicep, tricep, grip, hip flexor, knee  extensors, ankle dorsiflexor and plantar flexor  Cerebellar exam normal finger to nose to finger as well as heel to shin in LEFT upper and lower extremities RUE NT due to long arm cast  Musculoskeletal: Full range of motion in all 4 extremities. No joint swelling, RIght elbow in long arm cast  ModA for sit to stand Ambulating with ModA with PFRW   Assessment/Plan: 1. Functional deficits which require 3+ hours per day of interdisciplinary therapy in a comprehensive inpatient rehab setting. Physiatrist is providing close team supervision and 24 hour management of active medical problems listed below. Physiatrist and rehab team continue to assess barriers to discharge/monitor patient progress toward functional and medical goals  Care Tool:  Bathing    Body parts bathed by patient: Right arm, Chest, Abdomen, Front perineal area, Right upper leg, Left upper leg, Face, Right lower leg   Body parts bathed by helper: Left arm, Buttocks     Bathing assist Assist Level: Moderate Assistance - Patient 50 - 74%     Upper Body Dressing/Undressing Upper body dressing   What is the patient wearing?: Pull over shirt    Upper body assist Assist Level: Supervision/Verbal cueing    Lower Body Dressing/Undressing Lower body dressing      What is the patient wearing?: Underwear/pull up, Pants     Lower  body assist Assist for lower body dressing: Moderate Assistance - Patient 50 - 74%     Toileting Toileting    Toileting assist Assist for toileting: Moderate Assistance - Patient 50 - 74%     Transfers Chair/bed transfer  Transfers assist     Chair/bed transfer assist level: Minimal Assistance - Patient > 75%     Locomotion Ambulation   Ambulation assist      Assist level: Minimal Assistance - Patient > 75% Assistive device: Walker-platform Max distance: 173f   Walk 10 feet activity   Assist     Assist level: Minimal Assistance - Patient > 75% Assistive device:  Walker-platform   Walk 50 feet activity   Assist Walk 50 feet with 2 turns activity did not occur: Safety/medical concerns (fatigue and generalized weakness)  Assist level: Minimal Assistance - Patient > 75% Assistive device: Walker-platform    Walk 150 feet activity   Assist Walk 150 feet activity did not occur: Safety/medical concerns (fatigue and generalized weakness)  Assist level: Minimal Assistance - Patient > 75% Assistive device: Walker-platform    Walk 10 feet on uneven surface  activity   Assist     Assist level: Moderate Assistance - Patient - 50 - 74% Assistive device: Walker-platform   Wheelchair     Assist Is the patient using a wheelchair?: Yes Type of Wheelchair: Manual    Wheelchair assist level: Dependent - Patient 0%      Wheelchair 50 feet with 2 turns activity    Assist        Assist Level: Dependent - Patient 0%   Wheelchair 150 feet activity     Assist      Assist Level: Dependent - Patient 0%   Blood pressure 128/72, pulse 66, temperature 97.7 F (36.5 C), resp. rate 17, height '5\' 6"'$  (1.676 m), weight 56.5 kg, SpO2 95 %.  Medical Problem List and Plan: 1. Functional deficits secondary to right cerebellar hemispheric punctate focus of acute ischemia             -patient may  shower             -ELOS/Goals: 7-10 days PT/OT Sup, SLP Mod I              1,000U daily vitamin D started  CT Head ordered for AMS and was stable. Discussed with daughter  Labs reviewed and stable 2.  Impaired mobility: continue Lovenox since ambulating 90 feet, Mod A sit to stand             -antiplatelet therapy: Aspirin 81 mg daily and Plavix 75 mg daily x 3 weeks then aspirin alone 3. Pain Management: Tylenol as needed 4. Mood/Behavior/Sleep: Aricept 5 mg daily, Lexapro 20 mg daily             -antipsychotic agents: N/A 5. Neuropsych/cognition: This patient is capable of making decisions on her own behalf. Oriented with cues to date ,  remembered B day yesterday  6. Skin/Wound Care: Routine skin checks 7. Fluids/Electrolytes/Nutrition: Routine in and outs with follow-up chemistries 8.  Hypertension.  Cozaar 50 mg daily.  Monitor with increased mobility. Has been well controlled. Klor increased to 430m daily.  Vitals:   10/10/22 1952 10/11/22 0508  BP: 120/82 128/72  Pulse: 68 66  Resp: 15 17  Temp: 98.5 F (36.9 C) 97.7 F (36.5 C)  SpO2: 93% 95%    9.  Hyperlipidemia.  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  Continue Deutetrabenazine. Discussed with daughter, she will bring her medication in from home 70.  Tobacco use.  NicoDerm patch.  Provide counseling 12.  Incidental findings of right thyroid nodule as well as 1.6 cm left upper lobe groundglass opacity.  Follow-up outpatient. Discussed follow-up in hospital with daughter given her concerns for infectious process, discussion is ongoing 24. Dysphagia, Continue Dys 3 diet, SLP eval 14. [place], On aricept 15. Cough, recheck CXR 16. Right wrist distal radius fracture. Followed by [employer]. Non weight-bearing on the right wrist , long arm cast  17 Thrush. Diflucan ordered 18. Suboptimal potassium: klor increased to 72mq daily, repeat today 19. AMS: WBC obtained and stable 20. Rising BUN: NS bolus ordered.  21. Positive UA: discussed starting antibiotic,UCx neg hold off on abx   Had hematuria w/u in 2022 , + nephrolithiasis, no flank pan c/o but may have become symptomatic with anticoag, monitor Hgb   LOS: 7 days A FACE TO FACE EVALUATION WAS PERFORMED  KMartha ClanP Yuto Cajuste 10/11/2022, 11:34 AM

## 2022-10-11 NOTE — Progress Notes (Signed)
Physical Therapy Session Note  Patient Details  Name: Barbara Krause MRN: [id number] Date of Birth: May 12, 1941  Today's Date: 10/11/2022 PT Individual Time: 1301-1410 PT Individual Time Calculation (min): 69 min   Short Term Goals: Week 1:  PT Short Term Goal 1 (Week 1): pt will transfer sit<>stand with LRAD and CGA PT Short Term Goal 2 (Week 1): pt will transfer bed<>chair with LRAD and CGA PT Short Term Goal 3 (Week 1): pt will ambulate 22f with LRAD and CGA  Skilled Therapeutic Interventions/Progress Updates:   Received pt sitting on commode with daughter assisting with toileting. Pt agreeable to PT treatment and denied any pain during session. Session with emphasis on functional mobility/transfers, generalized strengthening and endurance, dynamic standing balance/coordination, stair navigation, and gait training. [facility]   Pt reported having 4 STE with bilateral handrails but not able to reach both. [facility] and navigated 8 3in steps x 2 trials with min A overall. Trial 1: using bilateral rails and alternating ascending and descending with a step to and step through pattern and Trial 2: using only 1 handrail - cues for foot placement,  step-to stepping technique, and upright posture. Pt then navigated 4 6in steps x 2 trials with 1 handrail and min A. Trial 1: assist provided by therapist and Trial 2: assist provided by daughter. [name] daughter requesting D/C date be extended to Monday, as she will not be back in town until then to train pt's caregivers - secure chatted MD and approved for extension until 12/18.  In dayroom, pt stood with R PFRW and heavy min A and ambulated 915fwith R PFRW and CGA fading to close supervision. Pt requested to stop due to fatigue and NT brought WC for pt to sit in. Noted improvements in step length and proximity to RW with visual cue of yellow TB on RW. Pt requested to  return to bed - stood with R PFRW and min A and ambulated back to bed. Doffed shoes mod I and transferred sit<>supine with supervision. Concluded session with pt semi-reclined in bed, needs within reach, and bed alarm on.   Therapy Documentation Precautions:  Precautions Precautions: Fall Restrictions Weight Bearing Restrictions: Yes RUE Weight Bearing: Non weight bearing Other Position/Activity Restrictions: RT Wrist fracture ~3 weeks ago   Therapy/Group: Individual Therapy AnAlfonse AlpersT, DPT  10/11/2022, 7:03 AM

## 2022-10-11 NOTE — Progress Notes (Signed)
Physical Therapy Session Note  Patient Details  Name: Barbara Krause MRN: [id number] Date of Birth: 03-19-1941  Today's Date: 10/11/2022 PT Individual Time: 1510-1540 PT Individual Time Calculation (min): 30 min   Short Term Goals: Week 1:  PT Short Term Goal 1 (Week 1): pt will transfer sit<>stand with LRAD and CGA PT Short Term Goal 2 (Week 1): pt will transfer bed<>chair with LRAD and CGA PT Short Term Goal 3 (Week 1): pt will ambulate 97f with LRAD and CGA  Skilled Therapeutic Interventions/Progress Updates:  Patient greeted supine in bed and agreeable to PT treatment session. Patient transitioned from supine to sitting EOB with supv- Once sitting EOB, therapist donned shoes for time management. Patient requesting to use the wheelchair to transport to the gym secondary to reports of lethargy this afternoon. Patient performed sit/stand without AD and MinA for improved anterior weight shift. Upon standing, patient performed stand pivot transfer to wheelchair with CGA and VC for sequencing.   Throughout treatment session, patient performed various sit/stands with CGA/MinA for increased anterior weight shift/initiating standing. VC for scooting forward, tucking B feet and increased anterior weight shift with good recall noted. Patient performed various transfers with PFRW and with L HHA and CGA with VC for aligning herself with the wheelchair prior to sitting. At times patient demonstrated poor eccentric control, however with cues was able to demonstrate improvements.   Patient gait trained x85' without the use of an AD and CGA/MinA for improved stability- Therapist providing L HHA throughout gait trial with VC for improved postural extension and decreased anterior weight shift while ambulating. Patient continued to demonstrate vaulting on R LE during toe off phase of gait with a shuffling pattern as she fatigued.   Patient gait trained x118' with R PFRW and CGA/MinA for improved stability-  Patient continued to demonstrate difficulty stepping/staying within the frame of the RW despite VC and yellow TB for external visual cues leading to increased anterior lean and improved stability. Patient required frequent standing rest breaks to improve positioning throughout gait trial. Patient also demonstrated increased difficulty when turning requiring VC for marching her feet throughout turn.   Patient returned to her room and ambulated ~20' to her recliner with L HHA and CGA. Patient left reclined in bedside recliner with call bell in lap, daughter present and all needs met. Patient given an Ensure Protein at end of session with approval by RN.    Therapy Documentation Precautions:  Precautions Precautions: Fall Restrictions Weight Bearing Restrictions: Yes RUE Weight Bearing: Non weight bearing Other Position/Activity Restrictions: RT Wrist fracture ~3 weeks ago  Therapy/Group: Individual Therapy  Dynastee Brummell 10/11/2022, 11:27 AM

## 2022-10-11 NOTE — Progress Notes (Signed)
Updated discharge date from the 15th to the 18th because daughter will be out of town and Monday would be better. Discharge date updated.

## 2022-10-11 NOTE — Progress Notes (Addendum)
Speech Language Pathology Daily Session Note  Patient Details  Name: Barbara Krause MRN: [id number] Date of Birth: 01-19-1941  Today's Date: 10/11/2022 SLP Individual Time: 1000-1045 SLP Individual Time Calculation (min): 45 min  Short Term Goals: Week 1: SLP Short Term Goal 1 (Week 1): Pt will tolerate Dysphagia 3 textures and thin liquids with stable medical status given Min A for adherence to aspiration precautions. SLP Short Term Goal 2 (Week 1): Pt will utilize speech intelligibility strategies to improve functional communicaiton and intelligibility to 90% with Min A verbal cues. SLP Short Term Goal 3 (Week 1): With use of external aids, pt will recall and implement aspiration precautions on 90% of PO trials without additional cues.  Skilled Therapeutic Interventions: Pt seen this date for skilled ST intervention targeting swallowing and motor speech goals outlined in above. Pt received awake/alert and OOB in w/c; finishing with OT. Agreeable to intervention in hospital room. Daughter present for the duration of today's session. Per chart review, VSS with no change in respiratory status.  Today's session with emphasis on pt + family education and use of compensatory swallowing and speech intelligibility strategies to include oral care prior to PO with Min A for pt to brush teeth seated at the sink level, and implementation of hard cough after each swallow, which pt performed with Min A verbal cues faded to Sup A gesture cues to refer to written cues on bedside table. With Set-Up A, pt self-administered and consumed small bites of graham crackers dipped in puree and thin water via straw with wet vocal quality appreciated after trials. Benefited from Min-Mod A verbal cues to monitor and correct oral residuals and anterior oral spillage of saliva; utilized washcloth to wipe mouth intermittently when cued. Additionally, pt benefited from Mod A verbal and model prompts to implement speech  intelligibility strategies to improve speech intelligibility to ~85-90% at the short sentence level; most beneficial strategies were breath support, increased vocal intensity, and over-articulation. Without prompts, pt only ~60-65% intelligible. With Min A verbal cues to assist with motor planning, pt completed 3 set of 5 reps with EMST (1 set at 10 cm H2O which pt self-rated as "easy" and 2 sets at 15 cm H2O which pt self-rated to be "medium" upon inquiry). Provided verbal education to pt and pt's daughter re: recommendation to perform oral care prior to all PO intake (at least TID), hard cough after each bite/sip of food, recommended diet consistencies + recs for full supervision with intake of solid textures due to high risk of choking, ST POC, and EMST guidelines and purpose; they verbalized understanding. Asked pt if she would like to decrease her diet to Dysphagia 2 textures, with pt adamantly stating "no."  Pt left in room and OOB in w/c with all safety measures activated and call bell within reach. Daughter present for supervision of PO at recommendation of SLP. Continue per current ST POC. Per chart review, VSS.  Pain None reported; NAD  Therapy/Group: Individual Therapy  Cayce Quezada A Ardelle Haliburton 10/11/2022, 12:46 PM

## 2022-10-12 ENCOUNTER — Inpatient Hospital Stay (HOSPITAL_COMMUNITY): Payer: Medicare Other

## 2022-10-12 LAB — BASIC METABOLIC PANEL
Anion gap: 8 (ref 5–15)
BUN: 27 mg/dL — ABNORMAL HIGH (ref 8–23)
CO2: 28 mmol/L (ref 22–32)
Calcium: 9.4 mg/dL (ref 8.9–10.3)
Chloride: 107 mmol/L (ref 98–111)
Creatinine, Ser: 0.88 mg/dL (ref 0.44–1.00)
GFR, Estimated: >60 mL/min (ref >60)
Glucose, Bld: 96 mg/dL (ref 70–99)
Potassium: 4.4 mmol/L (ref 3.5–5.1)
Sodium: 143 mmol/L (ref 135–145)

## 2022-10-12 MED ORDER — SCOPOLAMINE 1 MG/3DAYS TD PT72
1.0000 | MEDICATED_PATCH | TRANSDERMAL | Status: DC
  Administered 2022-10-12 – 2022-10-15 (×2): 1.5 mg via TRANSDERMAL
  Filled 2022-10-12 (×2): qty 1

## 2022-10-12 MED ORDER — DEUTETRABENAZINE 12 MG PO TABS
2.0000 | ORAL_TABLET | Freq: Every day | ORAL | Status: DC
  Administered 2022-10-12 – 2022-10-17 (×6): 2 via ORAL
  Filled 2022-10-12 (×5): qty 2

## 2022-10-12 MED ORDER — POTASSIUM CHLORIDE 20 MEQ PO PACK
20.0000 meq | PACK | Freq: Every day | ORAL | Status: DC
  Administered 2022-10-13 – 2022-10-18 (×6): 20 meq via ORAL
  Filled 2022-10-12 (×6): qty 1

## 2022-10-12 MED ORDER — DEUTETRABENAZINE 12 MG PO TABS
1.0000 | ORAL_TABLET | Freq: Every day | ORAL | Status: DC
  Administered 2022-10-13 – 2022-10-18 (×6): 1 via ORAL
  Filled 2022-10-12 (×6): qty 1

## 2022-10-12 MED ORDER — DEUTETRABENAZINE 12 MG PO TABS
2.0000 | ORAL_TABLET | Freq: Every day | ORAL | Status: DC
  Filled 2022-10-12: qty 2

## 2022-10-12 MED ORDER — SODIUM CHLORIDE 0.9 % IV BOLUS
500.0000 mL | Freq: Once | INTRAVENOUS | Status: AC
  Administered 2022-10-12: 500 mL via INTRAVENOUS

## 2022-10-12 MED ORDER — DOCUSATE SODIUM 100 MG PO CAPS
100.0000 mg | ORAL_CAPSULE | Freq: Every day | ORAL | Status: DC
  Administered 2022-10-12 – 2022-10-18 (×7): 100 mg via ORAL
  Filled 2022-10-12 (×7): qty 1

## 2022-10-12 MED ORDER — GUAIFENESIN 100 MG/5ML PO LIQD
10.0000 mL | ORAL | Status: DC | PRN
  Administered 2022-10-12 – 2022-10-13 (×3): 10 mL via ORAL
  Filled 2022-10-12 (×3): qty 10

## 2022-10-12 NOTE — Progress Notes (Addendum)
Patient ID: Barbara Krause, female   DOB: 05-23-1941, 81 y.o.   MRN: [id number]  [facility] referral sent to [facility]. Orders sent to PT/OT/SLP

## 2022-10-12 NOTE — Progress Notes (Signed)
Pt with wet sounding cough. On call provider Reesa Chew PA, notified, new orders received and implemented.

## 2022-10-12 NOTE — Progress Notes (Signed)
Patient ID: Barbara Krause, female   DOB: 1940/12/11, 81 y.o.   MRN: [id number]  Sw met with daughter and patient in room and addressed questions and concerns. Daughter requesting BSC and WC cushion. Patient would prefer [facility] for follow up. No additional questions or concerns.

## 2022-10-12 NOTE — Progress Notes (Signed)
Occupational Therapy Session Note  Patient Details  Name: Barbara Krause MRN: [id number] Date of Birth: 01-May-1941  Today's Date: 10/12/2022 OT Individual Time: 1120-1203 & 1430-1520 OT Individual Time Calculation (min): 43 min & 50 min OT missed time: 10 min Missed time reason: fatigue  Short Term Goals: Week 1:  OT Short Term Goal 1 (Week 1): STG = LTG due to ELOS  Skilled Therapeutic Interventions/Progress Updates:  Session 1 Skilled OT intervention completed with focus on ADL retraining, toileting and functional ambulation without AD. Pt received seated in w/c with daughter present, agreeable to session. No pain reported during session.  Pt agreeable to change clothes, with pt able to doff shirt with supervision using overhead method, required increased time for UB dressing with cues needed for recalling steps of hemi-method but pt able to do with time. CGA sit > stand without AD, doffed pants with min A, then at seated level used figure 4 position with cues to thread pants with therapist only assisting with threading due to grip socks and increased friction, then able to donn min A at the stance level.   Transported w/c into bathroom, then CGA sit > stand and stand pivot without AD to [facility] over toilet. Able to doff pants with CGA, continent of void, with pt able to manage toileting steps with overall min A. CGA stand pivot to w/c.   Discussed/problem solved with daughter pt's DME recommendations, in which pt needs a 3 in 1 BSC for bathroom needs, as well as follow up recommendations for HHOT vs OPOT at this time until cast is removed for RUE. Also discussed daughter's goals for pt including being able to stand at sink for oral care due to home environment, with plans to work on these areas in future OT.  In hallway, pt ambulated about 15 ft total without AD and CGA/min A with w/c follow for fatigue, with pt able to complete 1 turn with CGA. Pt does demo increased gait stride and improved  timing without PFRW however still has moments of "pausing" with discussion on how this is not necessarily recommended at this time for ambulation at home, however can improve balance/coordination skills in a different way.   Pt remained seated in w/c, with daughter present therefore alarm left off, with all needs in reach at end of session.  Session 2 Skilled OT intervention completed with focus on sit > stands with emphasis on anterior weight shifting, erect posture in stance with visual feedback, dynamic balance in prep for BADLs. Pt received seated in w/c, agreeable to session. No pain reported.  Transported pt dependently in w/c <> gym for time and energy conservation. Completed x3 blocked practice sit > stands with initial min A fading to CGA with cues for anterior weight shifting, and with use of long mirror for visual feedback. Cues needed in stance with CGA without AD for tucking pelvis under and scapular retraction for functional standing posture as well as for reduction of anterior lean on R PFRW during transfers. By 2/3 stands, pt was able to self-correct posture without cues.  In stance with CGA without AD, pt participate in squigz placement to promote head elevation and forward gaze needed for safe ambulation as well as during standing ADLs. Able to place squigz on high mirror with min difficulty. Required intermittent seated rest break, then able to retrieve in stance with same assist.  Pt requested to use bathroom, with CGA sit > stand and stand pivot to [facility] without AD in bathroom.  Able to doff pants with min A. Continent of void only. Able to stand with CGA, with pt attempting to reach pants from ankle level however instructed to pt avoid this for balance/safety reasons, and donn to knees prior to standing. Overall mod A needed for toileting. CGA stand pivot to w/c, then ambulatory transfer without AD with CGA to EOB then CGA bed mobility with HOB elevated to upright in bed. Pt missed 10  mins of OT intervention due to fatigue from prior session; will make up missed time as able.  Pt remained upright in bed, with bed alarm on/activated, fresh wash cloth for saliva, suction in hand and with all needs in reach at end of session.   Therapy Documentation Precautions:  Precautions Precautions: Fall Restrictions Weight Bearing Restrictions: Yes RUE Weight Bearing: Non weight bearing Other Position/Activity Restrictions: RT Wrist fracture ~3 weeks ago    Therapy/Group: Individual Therapy  Blase Mess, MS, OTR/L  10/12/2022, 3:29 PM

## 2022-10-12 NOTE — Plan of Care (Signed)
Goal downgraded due to slower than anticipated progress.   Problem: RH Expression Communication Goal: LTG Patient will increase speech intelligibility (SLP) Description: LTG: Patient will increase speech intelligibility at word/phrase/conversation level with cues, % of the time (SLP) Flowsheets (Taken 10/12/2022 1301) LTG: Patient will increase speech intelligibility (SLP): Minimal Assistance - Patient > 75% Percent of time patient will use intelligible speech: 80

## 2022-10-12 NOTE — Progress Notes (Addendum)
Speech Language Pathology Weekly Progress and Session Note  Patient Details  Name: Barbara Krause MRN: [id number] Date of Birth: 10/26/1941  Beginning of progress report period: October 05, 2022 End of progress report period: October 12, 2022  Today's Date: 10/12/2022 SLP Individual Time: 0930-1030 SLP Individual Time Calculation (min): 60 min  Short Term Goals: Week 1: SLP Short Term Goal 1 (Week 1): Pt will tolerate Dysphagia 3 textures and thin liquids with stable medical status given Min A for adherence to aspiration precautions. SLP Short Term Goal 1 - Progress (Week 1): Met SLP Short Term Goal 2 (Week 1): Pt will utilize speech intelligibility strategies to improve functional communicaiton and intelligibility to 90% with Min A verbal cues. SLP Short Term Goal 2 - Progress (Week 1): Not met SLP Short Term Goal 3 (Week 1): With use of external aids, pt will recall and implement aspiration precautions on 90% of PO trials without additional cues. SLP Short Term Goal 3 - Progress (Week 1): Met    New Short Term Goals: Week 2: SLP Short Term Goal 1 (Week 2): STG's = LTG's due to ELOS  Weekly Progress Updates: This past reporting period, pt has demonstrated steady progress as evident by meeting 2 out of 3 short-term goals. Overall, pt benefits from Min-Mod A to Min A for implementation speech intelligibility strategies; Sup A to Min A for aspiration precautions. Limited by fatigue and decreased motor planning. Goal level is Sup A for consumption of least restrictive diet and Min A for speech intelligibility. Continue to recommend ST during CIR admission, as well as 24/7 supervision and assistance + HHST at time of d/c. Pt and family aware and in agreement.  Intensity: Minumum of 1-2 x/day, 30 to 90 minutes Frequency: 3 to 5 out of 7 days Duration/Length of Stay: 10-12 days Treatment/Interventions: Dysphagia/aspiration precaution training;Functional tasks;Environmental  controls;Patient/family education;Multimodal communication approach   Daily Session Skilled Therapeutic Interventions:     Pt seen this date for skilled ST intervention targeting swallowing and speech intelligibility goals outlined in care plan. Pt received awake/alert and sitting upright in bed. Agreeable to intervention in hospital room. Transferred from bed to w/c with Min-Mod A.   Today's session with emphasis on compensatory strategy training (safe swallowing and speech intelligibility), pt/family education, and cough response + diaphragmatic breathing. Benefited from Min A verbal cues for monitoring and self-correcting pooling of saliva in oral cavity (increased in w/c vs semi-reclined in bed). Min A, faded to Sup A to implement volitional hard cough after each swallow given presence of silent aspiration. To improve speech intelligibility to 80-90% at the phrase level, SLP provided biofeedback measures to include use of decibel meter to improve pt's awareness and self-correcting abilities to achieve verbalizations that were within "speech" vs "quiet/whisper" range. Participated in basic diaphragmatic breathing techniques given Min-Mod A verbal and visual cues. Activated EMST at 15 cm H2O (3 sets of 5 reps) with extended time between trials and Min-Mod, faded to Min A to improve activation and accuracy. Continues to report this resistance to be "medium."  Of note, pt appeared more tired in w/c than in bed with increased difficulty with oral containment of secretions. Pt noted to verbalize aspiration precautions, importance of oral care (which she completed with SLP given Min A), purpose of EMST, and tasks completed last session with overall Min A verbal cues. Upon inquiry, regarding secretion management, pt and pt's daughter report OP Palliative Care had prescribed scopolamine patch prior to admission to address decreased management of  saliva at baseline; MD messaged with pt reporting she would like to  try something for saliva management to improve her quality of life. Provided new [employer] bulb and provided ongoing education re: use when too fatigue, tired, and/or difficulty with motor planning to swallow excessive saliva; pt utilized intermittently with Min A verbal cues. Per chart review, vital signs and respiratory status stable. Of note, did mention use of amplifier to aid in vocal intensity, with pt declining interest. Daughter aware and appreciative of recommendation.   Pt left in room with all safety measures activated and call bell within reach. Daughter present. Continue per current ST POC.  Pain No pain reported; NAD  Therapy/Group: Individual Therapy  Kevyn Wengert A Letitia Sabala 10/12/2022, 1:00 PM

## 2022-10-12 NOTE — Progress Notes (Signed)
Physical Therapy Weekly Progress Note  Patient Details  Name: Barbara Krause MRN: [id number] Date of Birth: 1941/04/24  Beginning of progress report period: October 05, 2022 End of progress report period: October 12, 2022  Today's Date: 10/12/2022 PT Individual Time: 1315-1411 PT Individual Time Calculation (min): 56 min   Patient has met 2 of 3 short term goals. Pt demonstrates steady progress towards long term goals. Pt is currently able to perform bed mobility with supervision, sit<>stands with R PFRW and min/light mod A, stand<>pivot transfers with CGA, ambulate 143f with R PFRW and min A, and navigate 4 6in steps with 1 handrail and min A. Pt continues to require cues for proximity to RW with gait, demonstrates posterior bias when standing, shuffling gait pattern with "freezing" episodes, and requires cues for motor planning and sequencing (especially with turns) - however has improved since eval. [name] daughter started hands on family education training on 12/11. D/C date extended to 12/18 to allow daughter to be home for pt's discharge and train aides that will be assisting pt at home.   Patient continues to demonstrate the following deficits muscle weakness, decreased cardiorespiratoy endurance, impaired timing and sequencing, unbalanced muscle activation, decreased coordination, and decreased motor planning, decreased motor planning, and decreased standing balance, decreased postural control, and decreased balance strategies and therefore will continue to benefit from skilled PT intervention to increase functional independence with mobility.  Patient progressing toward long term goals..  Continue plan of care.  PT Short Term Goals Week 1:  PT Short Term Goal 1 (Week 1): pt will transfer sit<>stand with LRAD and CGA PT Short Term Goal 1 - Progress (Week 1): Progressing toward goal PT Short Term Goal 2 (Week 1): pt will transfer bed<>chair with LRAD and CGA PT Short Term Goal 2 -  Progress (Week 1): Met PT Short Term Goal 3 (Week 1): pt will ambulate 580fwith LRAD and CGA PT Short Term Goal 3 - Progress (Week 1): Met Week 2:  PT Short Term Goal 1 (Week 2): STG=LTG due to LOS  Skilled Therapeutic Interventions/Progress Updates:  Ambulation/gait training;Discharge planning;Functional mobility training;Therapeutic Activities;Visual/perceptual remediation/compensation;Balance/vestibular training;Disease management/prevention;Neuromuscular re-education;Skin care/wound management;Therapeutic Exercise;Wheelchair propulsion/positioning;Cognitive remediation/compensation;DME/adaptive equipment instruction;Pain management;Splinting/orthotics;UE/LE Strength taining/ROM;Community reintegration;Patient/family education;Stair training;UE/LE Coordination activities   Today's Interventions: Received pt semi-reclined in bed, pt agreeable to PT treatment, and denied any pain during session. Session with emphasis on functional mobility/transfers, dynamic standing balance/coordination, simulated car transfers, and gait training. Pt transferred supine<>sitting EOB from flat bed with supervision. Donned shoes with max A and pt transferred bed<>WC stand<>pivot without AD and min A. [facility] and energy [facility] purposes. Pt performed ambulatory simulated car transfer with R PFRW and mod A to stand from SUV height. Pt then ambulated 1069fn uneven surfaces (ramp) with R PFRW and CGA - cues for eccentric control when descending ramp. Stood x 2 additional trials with R PFRW and min A and pt ambulated [id number] and 28ft29f with R PFRW and close supervision - cues to take larger steps towards yellow TB (noted decreased carry over with cues today, possibly due to fatigue). Pt limited by fatigue throughout session, stating "I'm tired, I need to sit". Transported back to room due to fatigue and concluded session with pt sitting in WC, [facility],  and seatbelt alarm on awaiting upcoming OT session.   Therapy Documentation Precautions:  Precautions Precautions: Fall Restrictions Weight Bearing Restrictions: Yes RUE Weight Bearing: Non weight  bearing Other Position/Activity Restrictions: RT Wrist fracture ~3 weeks ago  Therapy/Group: Individual Therapy Alfonse Alpers PT, DPT  10/12/2022, 7:04 AM

## 2022-10-12 NOTE — Progress Notes (Signed)
Occupational Therapy Weekly Progress Note  Patient Details  Name: Barbara Krause MRN: [id number] Date of Birth: [birth year]  Beginning of progress report period: October 05, 2022 End of progress report period: October 12, 2022  No STGs created due to previous ELOS, however pt with extended discharge date to 12/18 due to daughter being out of town and is unable to receive pt for discharge until that date as well as needed time to progress pt to goal level. Current LTGs remain appropriate at this time and pt is making slow but steady progress towards her goals. Pt is at a CGA to min A ambulatory transfer level with R PFRW, is able to bathe at an overall min A level, dress with set up A for UB and CGA for LB and requires CGA to min assist for toileting tasks. Pt continues to demonstrate balance, endurance, coordination, delayed processing and initiation deficits resulting in difficulty completing BADL tasks without increased physical assist. Pt will benefit from continued skilled OT services to focus on mentioned deficits.   Patient continues to demonstrate the following deficits: muscle weakness, decreased cardiorespiratoy endurance, decreased coordination and decreased motor planning, and decreased standing balance and decreased balance strategies and therefore will continue to benefit from skilled OT intervention to enhance overall performance with BADL and Reduce care partner burden.  Patient progressing toward long term goals..  Continue plan of care.  OT Short Term Goals Week 1:  OT Short Term Goal 1 (Week 1): STG = LTG due to ELOS Week 2:  OT Short Term Goal 1 (Week 2): Continue progressing towards LTG as they remain appropriate    Blase Mess, MS, OTR/L  10/12/2022, 7:44 AM

## 2022-10-12 NOTE — Progress Notes (Signed)
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 No results for input(s): "WBC", "HGB", "HCT", "PLT" in the last 72 hours. Recent Labs    10/12/22 0515  NA 143  K 4.4  CL 107  CO2 28  GLUCOSE 96  BUN 27*  CREATININE 0.88  CALCIUM 9.4    Intake/Output Summary (Last 24 hours) at 10/12/2022 1227 Last data filed at 10/12/2022 0810 Gross per 24 hour  Intake 237 ml  Output --  Net 237 ml      Pressure Injury 10/08/22 Hand Posterior;Right Stage 2 -  Partial thickness loss of dermis presenting as a shallow open injury with a red, pink wound bed without slough. device related; open wound found under cast on right hand (Active)  10/08/22 1640  Location: Hand  Location Orientation: Posterior;Right  Staging: Stage 2 -  Partial thickness loss of dermis presenting as a shallow open injury with a red, pink wound bed without slough.  Wound Description (Comments): device related; open wound found under cast on right hand  Present on Admission: Yes    Physical Exam: Vital Signs Blood pressure (!) 151/89, pulse (!) 50, temperature 97.7 F (36.5 C), resp. rate 16, height '5\' 6"'$  (1.676 m), weight 56.5 kg, SpO2 97 %.   General: No acute distress, BMI 20.1 Mood and affect are appropriate HEENT: drawn facies Heart: Regular rate and rhythm no rubs murmurs or extra sounds Lungs: Clear to auscultation, breathing unlabored, no rales or wheezes Abdomen: Positive bowel sounds, soft nontender to palpation, nondistended Extremities: No clubbing, cyanosis, or edema Skin:  no evidence of rash, stage 2 to posterior of right  hand Neurologic: Cranial nerves II through XII intact, motor strength is 4/5 in left deltoid, bicep, tricep, grip, hip flexor, knee extensors, ankle dorsiflexor and plantar flexor  Cerebellar exam normal finger to nose to finger as well as heel to shin in LEFT upper and lower extremities RUE NT due to long arm cast  Musculoskeletal: Full range of motion in all 4 extremities. No joint swelling, RIght elbow in long arm cast  ModA for sit to stand Ambulating with ModA with PFRW, shuffling gait   Assessment/Plan: 1. Functional deficits which require 3+ hours per day of interdisciplinary therapy in a comprehensive inpatient rehab setting. Physiatrist is providing close team supervision and 24 hour management of active medical problems listed below. Physiatrist and rehab team continue to assess barriers to discharge/monitor patient progress toward functional and medical goals  Care Tool:  Bathing    Body parts bathed by patient: Right arm, Chest, Abdomen, Front perineal area, Right upper leg, Left upper leg, Face, Right lower leg   Body parts bathed by helper: Left arm, Buttocks     Bathing assist Assist Level: Moderate Assistance - Patient 50 - 74%     Upper Body Dressing/Undressing Upper body dressing   What is the patient wearing?: Pull over shirt  Upper body assist Assist Level: Supervision/Verbal cueing    Lower Body Dressing/Undressing Lower body dressing      What is the patient wearing?: Underwear/pull up, Pants     Lower body assist Assist for lower body dressing: Moderate Assistance - Patient 50 - 74%     Toileting Toileting    Toileting assist Assist for toileting: Minimal Assistance - Patient > 75%     Transfers Chair/bed transfer  Transfers assist     Chair/bed transfer assist level: Contact Guard/Touching assist     Locomotion Ambulation   Ambulation assist      Assist level: Minimal Assistance - Patient > 75% Assistive device:  Walker-platform Max distance: 153f   Walk 10 feet activity   Assist     Assist level: Minimal Assistance - Patient > 75% Assistive device: Walker-platform   Walk 50 feet activity   Assist Walk 50 feet with 2 turns activity did not occur: Safety/medical concerns (fatigue and generalized weakness)  Assist level: Minimal Assistance - Patient > 75% Assistive device: Walker-platform    Walk 150 feet activity   Assist Walk 150 feet activity did not occur: Safety/medical concerns (fatigue and generalized weakness)  Assist level: Minimal Assistance - Patient > 75% Assistive device: Walker-platform    Walk 10 feet on uneven surface  activity   Assist     Assist level: Moderate Assistance - Patient - 50 - 74% Assistive device: Walker-platform   Wheelchair     Assist Is the patient using a wheelchair?: Yes Type of Wheelchair: Manual    Wheelchair assist level: Dependent - Patient 0%      Wheelchair 50 feet with 2 turns activity    Assist        Assist Level: Dependent - Patient 0%   Wheelchair 150 feet activity     Assist      Assist Level: Dependent - Patient 0%   Blood pressure (!) 151/89, pulse (!) 50, temperature 97.7 F (36.5 C), resp. rate 16, height '5\' 6"'$  (1.676 m), weight 56.5 kg, SpO2 97 %.  Medical Problem List and Plan: 1. Functional deficits secondary to right cerebellar hemispheric punctate focus of acute ischemia             -patient may  shower             -ELOS/Goals: 7-10 days PT/OT Sup, SLP Mod I              1,000U daily vitamin D started  CT Head ordered for AMS and was stable. Discussed with daughter  Labs reviewed and stable 2.  Impaired mobility: continue Lovenox since ambulating 90 feet, Mod A sit to stand             -antiplatelet therapy: Aspirin 81 mg daily and Plavix 75 mg daily x 3 weeks then aspirin alone 3. Pain Management: Tylenol as needed 4. Depression: continue Aricept 5 mg daily, Lexapro 20 mg daily              -antipsychotic agents: N/A 5. Neuropsych/cognition: This patient is capable of making decisions on her own behalf. Oriented with cues to date , remembered B day yesterday  6. Skin/Wound Care: Routine skin checks 7. Fluids/Electrolytes/Nutrition: Routine in and outs with follow-up chemistries 8.  Hypertension.  Cozaar 50 mg daily.  Monitor with increased mobility. Has been well controlled. Klor increased to 457m daily. Elevated but will hold off on adding medication given bradycardia Vitals:   10/11/22 1948 10/12/22 0434  BP: (!) 161/83 (!) 151/89  Pulse: 61 (!) 50  Resp: 17 16  Temp: 97.8 F (36.6 C) 97.7 F (36.5 C)  SpO2: 96% 97%    9.  Hyperlipidemia.  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 Decreased dose to 1 tab per day as per neurologist recommendations given possible side effects of parkisnonian symptoms. Discussed with daughter, she will bring her medication in from home 69.  Tobacco use.  NicoDerm patch.  Provide counseling 12.  Incidental findings of right thyroid nodule as well as 1.6 cm left upper lobe groundglass opacity.  Follow-up outpatient. Discussed follow-up in hospital with daughter given her concerns for infectious process, discussion is ongoing 68. 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 Cough: CXR reviewed and stable 16. Right wrist distal radius fracture. Followed by [employer]. Non weight-bearing on the right wrist , long arm cast  17 Thrush. Diflucan ordered 18. Suboptimal potassium: klor increased to 3mq daily, repeat today 19. AMS: WBC obtained and stable 20. Rising BUN: NS bolus ordered.  21. Positive UA: discussed starting antibiotic,UCx neg hold off on abx   Had hematuria w/u in 2022 , + nephrolithiasis, no flank pan c/o but may have become symptomatic with anticoag, monitor Hgb   LOS: 8 days A FACE TO FACE EVALUATION WAS  PERFORMED  KClide DeutscherRaulkar 10/12/2022, 12:27 PM

## 2022-10-13 DIAGNOSIS — Z7189 Other specified counseling: Secondary | ICD-10-CM

## 2022-10-13 DIAGNOSIS — Z515 Encounter for palliative care: Secondary | ICD-10-CM

## 2022-10-13 LAB — CBC WITH DIFFERENTIAL/PLATELET
Abs Immature Granulocytes: 0.08 10*3/uL — ABNORMAL HIGH (ref 0.00–0.07)
Basophils Absolute: 0 10*3/uL (ref 0.0–0.1)
Basophils Relative: 0 %
Eosinophils Absolute: 0 10*3/uL (ref 0.0–0.5)
Eosinophils Relative: 0 %
HCT: 37.7 % (ref 36.0–46.0)
Hemoglobin: 12.9 g/dL (ref 12.0–15.0)
Immature Granulocytes: 1 %
Lymphocytes Relative: 5 %
Lymphs Abs: 0.8 10*3/uL (ref 0.7–4.0)
MCH: 30.9 pg (ref 26.0–34.0)
MCHC: 34.2 g/dL (ref 30.0–36.0)
MCV: 90.4 fL (ref 80.0–100.0)
Monocytes Absolute: 1 10*3/uL (ref 0.1–1.0)
Monocytes Relative: 6 %
Neutro Abs: 15.4 10*3/uL — ABNORMAL HIGH (ref 1.7–7.7)
Neutrophils Relative %: 88 %
Platelets: 199 10*3/uL (ref 150–400)
RBC: 4.17 MIL/uL (ref 3.87–5.11)
RDW: 13.1 % (ref 11.5–15.5)
WBC: 17.4 10*3/uL — ABNORMAL HIGH (ref 4.0–10.5)
nRBC: 0 % (ref 0.0–0.2)

## 2022-10-13 LAB — BASIC METABOLIC PANEL
Anion gap: 9 (ref 5–15)
BUN: 25 mg/dL — ABNORMAL HIGH (ref 8–23)
CO2: 27 mmol/L (ref 22–32)
Calcium: 9 mg/dL (ref 8.9–10.3)
Chloride: 105 mmol/L (ref 98–111)
Creatinine, Ser: 0.87 mg/dL (ref 0.44–1.00)
GFR, Estimated: >60 mL/min (ref >60)
Glucose, Bld: 144 mg/dL — ABNORMAL HIGH (ref 70–99)
Potassium: 4.1 mmol/L (ref 3.5–5.1)
Sodium: 141 mmol/L (ref 135–145)

## 2022-10-13 LAB — PROCALCITONIN: Procalcitonin: 0.14 ng/mL

## 2022-10-13 MED ORDER — SODIUM CHLORIDE 0.9% FLUSH
10.0000 mL | Freq: Two times a day (BID) | INTRAVENOUS | Status: DC
  Administered 2022-10-13 – 2022-10-16 (×6): 10 mL

## 2022-10-13 MED ORDER — LEVOFLOXACIN IN D5W 500 MG/100ML IV SOLN
500.0000 mg | INTRAVENOUS | Status: DC
  Administered 2022-10-13 – 2022-10-14 (×2): 500 mg via INTRAVENOUS
  Filled 2022-10-13 (×3): qty 100

## 2022-10-13 MED ORDER — SODIUM CHLORIDE 0.9% FLUSH: 10.0000 mL | INTRAVENOUS | Status: DC | PRN

## 2022-10-13 MED ORDER — CHLORHEXIDINE GLUCONATE CLOTH 2 % EX PADS
6.0000 | MEDICATED_PAD | Freq: Every day | CUTANEOUS | Status: DC
  Administered 2022-10-13 – 2022-10-18 (×6): 6 via TOPICAL

## 2022-10-13 MED ORDER — SODIUM CHLORIDE 0.9 % IV BOLUS
500.0000 mL | Freq: Once | INTRAVENOUS | Status: AC
  Administered 2022-10-13: 500 mL via INTRAVENOUS

## 2022-10-13 NOTE — Progress Notes (Signed)
SLP Cancellation Note  Patient Details Name: Barbara Krause MRN: [id number] DOB: February 15, 1941   Cancelled treatment:     SLP attempted to see pt for 30 minutes (scheduled for 1 hour) with original session broken into 2 different time segments due to fatigue. Upon entry, pt asleep and reports she is too fatigued to drink her Boost. Therefore, pt missed 30 minutes of skilled ST intervention. Will make up as able.   Akshith Moncus A Svara Twyman 10/13/2022, 12:50 PM

## 2022-10-13 NOTE — Consult Note (Signed)
Palliative Medicine Inpatient Consult Note  Consulting Provider:  Izora Ribas, MD   Reason for consult:   [place] Palliative Medicine Consult  Reason for Consult? goals of care discussion with patient and family, patient seems to prefer less interventions, would she prefer home with hospice?   10/13/2022  HPI:  Per intake H&P --> Barbara Krause is a 81 year old right-handed female with history significant for hypertension, breast cancer status post bilateral mastectomy 2009, memory deficit is maintained on Aricept, Huntington's disease followed at [facility], osteoporosis, history of tobacco use.   Palliative care has been asked to get involved to discuss goals of care in the setting of progressive disease processes.  Clinical Assessment/Goals of Care:  *Please note that this is a verbal dictation therefore any spelling or grammatical errors are due to the "Simms One" system interpretation.  I have reviewed medical records including EPIC notes, labs and imaging, received report from bedside RN, assessed the patient who is sitting in bed in no acute distress.    I met with Barbara Krause and her daughter, Barbara Krause to further discuss diagnosis prognosis, GOC, EOL wishes, disposition and options.   I introduced Palliative Medicine as specialized medical care for people living with serious illness. It focuses on providing relief from the symptoms and stress of a serious illness. The goal is to improve quality of life for both the patient and the family.  Medical History Review and Understanding:  Reviewed Barbara Krause's past medical history as above.  She and I discussed her Huntington's disease and how she was diagnosed later in life.  Social History:  Was born in [place].  She moved to [place] at 23 due to the [employer] and she stayed there at work.  She moved to [place] at the height of the pandemic to be closer to her children. She was  married there was now a widow and has 2 children and 3 grandchildren.  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  She enjoys being outside.  She is a woman of faith and practices within Christianity-Protestant.  Functional and Nutritional State:  Barbara Krause had been living with 4 hours of caregiving a day prior to admission.  Her daughter Barbara Krause lives 8 minutes away and Barbara Krause lives in the [place] area.  Patient's daughter shares that they plan to increase caregiver support.  Stopped smoking 4 weeks ago.   Advance Directives:  A detailed discussion was had today regarding advanced directives.  Yes these can be found in Vynca.  Code Status:  Concepts specific to code status, artifical feeding and hydration, continued IV antibiotics and rehospitalization was had.  The difference between a aggressive medical intervention path  and a palliative comfort care path for this patient at this time was had.   Established DNAR/DNI.  MOST in Vynca:  Cardiopulmonary Resuscitation: Do Not Attempt Resuscitation (DNR/No CPR)  Medical Interventions: Comfort Measures: Keep clean, warm, and dry. Use medication by any route, positioning, wound care, and other measures to relieve pain and suffering. Use oxygen, suction and manual treatment of airway obstruction as needed for comfort. Do not transfer to the hospital unless comfort needs cannot be met in current location.  Antibiotics: Determine use of limitation of antibiotics when infection occurs  IV Fluids: IV fluids for a defined trial period  Feeding Tube: No feeding tube   Discussion:  Patient's goals  at this time are to go home.  Per discussion with she and her daughter the plan is for possible discharge on Monday.  Oda asked me if she will require 24/7 caregiving and I shared that this will really be up to the rehabilitation team's.  Discussed ongoing palliative care needs  and how [facility] will continuing to follow her on discharge.  Reviewed that if any point in time she determined she does not want to return to the hospital was at a point where she does not feel like she is progressing hospice can be elected.  Patient and her daughter feel she is more clear minded and vocal since decrease of her austedo.  Discussed the importance of continued conversation with family and their  medical providers regarding overall plan of care and treatment options, ensuring decisions are within the context of the patients values and GOCs.  Decision Maker: Barbara Krause Daughter [phone]    SUMMARY OF RECOMMENDATIONS   DNAR/DNI  DNR/MOST/Advance Directives in [place] support will continue as an OP though [facility]  Goals to go home w/ inc. Home support  PMT will continue to follow incrementally  Code Status/Advance Care Planning: DNAR/DNI  Palliative Prophylaxis:  Aspiration, Bowel Regimen, Delirium Protocol, Frequent Pain Assessment, Oral Care, Palliative Wound Care, and Turn Reposition  Additional Recommendations (Limitations, Scope, Preferences): Continue current care  Psycho-social/Spiritual:  Desire for further Chaplaincy support: Not presently Additional Recommendations: Reviewed of illness to date and function decline   Prognosis: Unclear   Discharge Planning: Discharge per CIR team, likely Monday.  Vitals:   10/13/22 0353 10/13/22 1310  BP: 134/66 102/78  Pulse: 73 64  Resp: 16 17  Temp: 98.4 F (36.9 C) 98.2 F (36.8 C)  SpO2: 93% 96%    Intake/Output Summary (Last 24 hours) at 10/13/2022 1413 Last data filed at 10/13/2022 0850 Gross per 24 hour  Intake 325 ml  Output --  Net 325 ml   Last Weight  Most recent update: 10/13/2022  4:02 AM    Weight  53.2 kg (117 lb 4.6 oz)            Gen:  Frail Elderly Caucasian F in NAD HEENT: moist mucous membranes CV: Regular rate and rhythm  PULM: ON Ra, breathing  even and nonlabored ABD: soft/nontender/  EXT: No edema  Neuro: Alert and oriented x3   PPS: 30%   This conversation/these recommendations were discussed with patient primary care team, Barbara Krause  Billing based on MDM: High  Problems Addressed: One acute or chronic illness or injury that poses a threat to life or bodily function  Amount and/or Complexity of Data: Category 3:Discussion of management or test interpretation with external physician/other qualified health care professional/appropriate source (not separately reported)  Risks: Decision regarding hospitalization or escalation of hospital care and Decision not to resuscitate or to de-escalate care because of poor prognosis ______________________________________________________ [place] Team Team Cell Phone: [phone] Please utilize secure chat with additional questions, if there is no response within 30 minutes please call the above phone number  Palliative Medicine Team providers are available by phone from 7am to 7pm daily and can be reached through the team cell phone.  Should this patient require assistance outside of these hours, please call the patient's attending physician.

## 2022-10-13 NOTE — Progress Notes (Signed)

## 2022-10-13 NOTE — Progress Notes (Addendum)
Speech Language Pathology Daily Session Note  Patient Details  Name: Barbara Krause MRN: [id number] Date of Birth: 1941/07/13  Today's Date: 10/13/2022 SLP Individual Time: 1000-1030 SLP Individual Time Calculation (min): 30 min  Short Term Goals: Week 2: SLP Short Term Goal 1 (Week 2): STG's = LTG's due to ELOS  Skilled Therapeutic Interventions: Pt seen this date for skilled ST intervention targeting motor speech/speech intelligibility goals outlined in care plan. Pt received awake, though visibly fatigued; OOB in w/c and recently finished with OT. Agreeable to intervention in hospital room. States she did not sleep well last night due to coughing all night and CXR, which MD states shows PNA + WBC elevated.  Today's session with continued emphasis on improving speech intelligibility within functional communication, pt education, and cough response + diaphragmatic breathing. Of note, pt with improved speech intelligibility and secretion management with scopolamine patch in place; pt subjectively judged to be ~90% intelligible at the word and phrase level without cues. Participated in structured speech tasks targeting production of functional phrases with 90% intelligibility without cues. Additionally, no cueing required for monitoring and self-correcting excessive saliva nor need for oral suctioning. Participated in EMST for 2 sets of 5 repetitions and flutter valve 1 set for 5 repetitions given Sup A (remains at 15 cm H2O); benefits from rest breaks between trials. Politely declined PO trials. Discussed with pt if she would like to work towards utilizing a low- or high-tech AAC device to aid in comprehensibility and communication when fatigued (as an energy conservation technique). Despite education on use of this on an PRN basis and that it would simply be another tool to utilize, pt declined. Pt verbalized ST POC with Sup A.  Transferred from w/c to bed with Min-Mod A stand pivot at pt's request.  Pt left in bed and with NT present, assisting with repositioning in bed. Continue per current ST POC. Discussed concerns for ongoing PNA in the setting of chronic aspiration with MD. MD placed IP Palliative Care consult to further discuss goals of care with pt and pt's daughters as it relates to dysphagia.   Pain None reported; NAD   Therapy/Group: Individual Therapy  Dolph Tavano A Daana Petrasek 10/13/2022, 12:31 PM

## 2022-10-13 NOTE — Progress Notes (Signed)
Physical Therapy Session Note  Patient Details  Name: Barbara Krause MRN: [id number] Date of Birth: 1940-12-03  Today's Date: 10/13/2022 PT Individual Time: 1740-8144 PT Individual Time Calculation (min): 54 min   and  Today's Date: 10/13/2022 PT Missed Time: 21 Minutes Missed Time Reason: Other (Comment) (sterile procedure)  Short Term Goals: Week 2:  PT Short Term Goal 1 (Week 2): STG=LTG due to LOS  Skilled Therapeutic Interventions/Progress Updates:    Pt having sterile procedure done, placement of midline in LUE, therefore therapist having to return once that was complete. Missed 21 minutes of skilled physical therapy due to this.   Pt received supine in bed and eager to participate in therapy session. Pt quick to initiate coming to EOB after mentioning getting up with possibly poor insight into situation. Supine>sitting R EOB, HOB partially elevated with supervision.   Sitting EOB donned tennis shoes mod assist. Sit>stand EOB>R PFRW with heavy min/light mod assist for rising to stand due to posterior lean from lack of sufficient anterior trunk flexion. Once standing with improved midline balance, pt able to perform L stand pivot to w/c using R PFRW with light min assist primarily for AD management and cuing for sequencing.    Transported to/from gym in w/c for time management and energy conservation.  Sit>stand w/c>R PFRW with CGA after cuing pt to bring her head towards the front bar of the AD to promote increased trunk flexion and then pt able to come to standing with less assistance. Pt with improved ability to come to standing for remainder of session but continues to require verbal/tactile cuing for increased anterior trunk flexion and frequently requires 2x initiation of transfer to come fully upright.  Gait training 66f using R PFRW with heavy min assist for balance and AD management - therapist facilitating continuous forward movement of AD to promote increased B LE step  lengths and increasing gait speed but then noticed pt started to have increased anterior trunk lean; however, pt lacking forward propulsion of her movement during gait.   Gait training 1440fusing +2 B HHA with +2 min assist for balance (pt pushing mod A through L HHA support) - noticed pt with continued forward trunk flexion with anterior pelvic tilt, but when cued to tuck her hips and posteriorly rotate her pelvis pt with improved upright posture and improved balance - pt achieving increased gait speed with increased step lengths compared to with AD but requires cuing to be aware of when anterior trunk lean is starting to occur.   Gait 7059fsing +2 B HHA (therapist supporting R UE at forearm) with +2 min assist for balance (pt pushing mod A through L HHA) - pt with improving upright posture and improved ability to motor plan how to obtain upright, but continued delayed awareness of when she starts to flex her trunk forward - noticed she frequently plantarflexes up on her R toes during stance phase almost "vaulting" on that side to improve L LE foot clearance during swing.  Dynamic gait training of side stepping with +2 B HHA ~23f70f each direction with +2 min assist for balance - continued cuing to maintain upright posture - pt with increased difficulty stepping towards L compared to R. Progressed to backwards gait training with +2 B HHA with heavy min assist for balance with longer distance pt started to have progressively worsening posterior lean (likely due to fatigue resulting in shortening step lengths).  Transported back to her room. L stand pivot w/c>recliner using L  HHA with min assist of 1. Pt left seated in recliner with needs in reach, seat belt alarm on, and B LEs elevated. Therapist reinforced education that pt requires full supervision to consume liquids - notified nursing staff that pt would like to drink her Boost for dinner.     Therapy Documentation Precautions:   Precautions Precautions: Fall Restrictions Weight Bearing Restrictions: Yes RUE Weight Bearing: Non weight bearing Other Position/Activity Restrictions: RT Wrist fracture ~3 weeks ago   Pain:  Reports R elbow pain (in the cast) but no interventions needed during session for pain management.    Therapy/Group: Individual Therapy  Tawana Scale , PT, DPT, NCS, CSRS 10/13/2022, 3:24 PM

## 2022-10-13 NOTE — Progress Notes (Signed)
PROGRESS NOTE   Subjective/Complaints: She finds the scopolamine patch is helping with her drooling- SLP notes improved speech as well too Discussed PNA and starting abx  ROS: denies pain, +cough, +hemauria as per nursing, +shuffling gait, +chorea more present at night  Objective:   DG Chest 2 View  Result Date: 10/12/2022 CLINICAL DATA:  Wet cough EXAM: CHEST - 2 VIEW COMPARISON:  10/08/2022 FINDINGS: Cardiac shadow is mildly enlarged but stable. Aortic calcifications are seen. Lungs are well aerated bilaterally. Persistent density is noted in the left mid lung similar to that seen on recent CT examination again likely representing early infiltrate. No other focal abnormality is noted. Multiple thoracic compression deformities are noted. IMPRESSION: Patchy airspace opacity in the left mid lung similar to that seen on recent CT examination. No new focal abnormality is noted. Electronically Signed   By: Inez Catalina M.D.   On: 10/12/2022 23:29   Recent Labs    10/13/22 0841  WBC 17.4*  HGB 12.9  HCT 37.7  PLT 199   Recent Labs    10/12/22 0515 10/13/22 0841  NA 143 141  K 4.4 4.1  CL 107 105  CO2 28 27  GLUCOSE 96 144*  BUN 27* 25*  CREATININE 0.88 0.87  CALCIUM 9.4 9.0    Intake/Output Summary (Last 24 hours) at 10/13/2022 1127 Last data filed at 10/13/2022 0850 Gross per 24 hour  Intake 325 ml  Output --  Net 325 ml      Pressure Injury 10/08/22 Hand Posterior;Right Stage 2 -  Partial thickness loss of dermis presenting as a shallow open injury with a red, pink wound bed without slough. device related; open wound found under cast on right hand (Active)  10/08/22 1640  Location: Hand  Location Orientation: Posterior;Right  Staging: Stage 2 -  Partial thickness loss of dermis presenting as a shallow open injury with a red, pink wound bed without slough.  Wound Description (Comments): device related; open wound  found under cast on right hand  Present on Admission: Yes    Physical Exam: Vital Signs Blood pressure 134/66, pulse 73, temperature 98.4 F (36.9 C), temperature source Oral, resp. rate 16, height '5\' 6"'$  (1.676 m), weight 53.2 kg, SpO2 93 %.   General: No acute distress, BMI 20.1 Mood and affect are appropriate HEENT: drawn facies Heart: Regular rate and rhythm no rubs murmurs or extra sounds Lungs: Clear to auscultation, breathing unlabored, no rales or wheezes Abdomen: Positive bowel sounds, soft nontender to palpation, nondistended Extremities: No clubbing, cyanosis, or edema Skin:  no evidence of rash, stage 2 to posterior of right hand Neurologic: Cranial nerves II through XII intact, motor strength is 4/5 in left deltoid, bicep, tricep, grip, hip flexor, knee extensors, ankle dorsiflexor and plantar flexor, increased chorea  Cerebellar exam normal finger to nose to finger as well as heel to shin in LEFT upper and lower extremities RUE NT due to long arm cast  Musculoskeletal: Full range of motion in all 4 extremities. No joint swelling, RIght elbow in long arm cast  ModA for sit to stand Ambulating with ModA with PFRW, shuffling gait   Assessment/Plan: 1. Functional deficits which  require 3+ hours per day of interdisciplinary therapy in a comprehensive inpatient rehab setting. Physiatrist is providing close team supervision and 24 hour management of active medical problems listed below. Physiatrist and rehab team continue to assess barriers to discharge/monitor patient progress toward functional and medical goals  Care Tool:  Bathing    Body parts bathed by patient: Right arm, Chest, Abdomen, Front perineal area, Right upper leg, Left upper leg, Face, Right lower leg   Body parts bathed by helper: Left arm, Buttocks     Bathing assist Assist Level: Moderate Assistance - Patient 50 - 74%     Upper Body Dressing/Undressing Upper body dressing   What is the patient  wearing?: Pull over shirt    Upper body assist Assist Level: Supervision/Verbal cueing    Lower Body Dressing/Undressing Lower body dressing      What is the patient wearing?: Underwear/pull up, Pants     Lower body assist Assist for lower body dressing: Moderate Assistance - Patient 50 - 74%     Toileting Toileting    Toileting assist Assist for toileting: Minimal Assistance - Patient > 75%     Transfers Chair/bed transfer  Transfers assist     Chair/bed transfer assist level: Contact Guard/Touching assist     Locomotion Ambulation   Ambulation assist      Assist level: Minimal Assistance - Patient > 75% Assistive device: Walker-platform Max distance: 148f   Walk 10 feet activity   Assist     Assist level: Minimal Assistance - Patient > 75% Assistive device: Walker-platform   Walk 50 feet activity   Assist Walk 50 feet with 2 turns activity did not occur: Safety/medical concerns (fatigue and generalized weakness)  Assist level: Minimal Assistance - Patient > 75% Assistive device: Walker-platform    Walk 150 feet activity   Assist Walk 150 feet activity did not occur: Safety/medical concerns (fatigue and generalized weakness)  Assist level: Minimal Assistance - Patient > 75% Assistive device: Walker-platform    Walk 10 feet on uneven surface  activity   Assist     Assist level: Moderate Assistance - Patient - 50 - 74% Assistive device: Walker-platform   Wheelchair     Assist Is the patient using a wheelchair?: Yes Type of Wheelchair: Manual    Wheelchair assist level: Dependent - Patient 0%      Wheelchair 50 feet with 2 turns activity    Assist        Assist Level: Dependent - Patient 0%   Wheelchair 150 feet activity     Assist      Assist Level: Dependent - Patient 0%   Blood pressure 134/66, pulse 73, temperature 98.4 F (36.9 C), temperature source Oral, resp. rate 16, height '5\' 6"'$  (1.676 m),  weight 53.2 kg, SpO2 93 %.  Medical Problem List and Plan: 1. Functional deficits secondary to right cerebellar hemispheric punctate focus of acute ischemia             -patient may  shower             -ELOS/Goals: 7-10 days PT/OT Sup, SLP Mod I              1,000U daily vitamin D started  CT Head ordered for AMS and was stable. Discussed with daughter  Labs reviewed 2.  Impaired mobility: continue Lovenox since ambulating 90 feet, Mod A sit to stand             -antiplatelet therapy: Aspirin 81  mg daily and Plavix 75 mg daily x 3 weeks then aspirin alone 3. Pain Management: Tylenol as needed 4. Depression: continue Aricept 5 mg daily, Lexapro 20 mg daily             -antipsychotic agents: N/A 5. Neuropsych/cognition: This patient is capable of making decisions on her own behalf. Oriented with cues to date , remembered B day yesterday  6. Skin/Wound Care: Routine skin checks 7. Fluids/Electrolytes/Nutrition: Routine in and outs with follow-up chemistries 8.  Hypertension.  Cozaar 50 mg daily.  Monitor with increased mobility. Has been well controlled. Klor increased to 55mq daily. Elevated but will hold off on adding medication given bradycardia Vitals:   10/12/22 2124 10/13/22 0353  BP: (!) 163/81 134/66  Pulse: 66 73  Resp: 16 16  Temp: 98.9 F (37.2 C) 98.4 F (36.9 C)  SpO2: 96% 93%    9.  Hyperlipidemia.  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  [facility]  Continue Deutetrabenazine. Decreased dose to 1 tab per day as per neurologist recommendations given possible side effects of parkisnonian symptoms. Discussed with daughter, she will bring her medication in from home 135  Tobacco use.  NicoDerm patch.  Provide counseling 12.  Incidental findings of right thyroid nodule as well as 1.6 cm left upper lobe groundglass opacity.  Follow-up outpatient. Discussed follow-up in hospital with daughter given her concerns for infectious process, discussion is  ongoing 134 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 Cough: CXR reviewed and stable 16. Right wrist distal radius fracture. Followed by [facility] Non weight-bearing on the right wrist , long arm cast  17 Thrush. Diflucan ordered 18. Suboptimal potassium: klor increased to 470m daily, repeat today 19. AMS: WBC obtained and stable 20. Rising BUN: NS bolus ordered. Repeat today.  21. Positive UA: discussed starting antibiotic,UCx neg hold off on abx   Had hematuria w/u in 2022 , + nephrolithiasis, no flank pan c/o but may have become symptomatic with anticoag, monitor Hgb  22. Drooling: continue scopolamine patch 23. Pneumonia: Quinolone started.   LOS: 9 days A FACE TO FACE EVALUATION WAS PERFORMED  KrMartha Clan Eudell Mcphee 10/13/2022, 11:27 AM

## 2022-10-13 NOTE — Patient Care Conference (Signed)
Inpatient RehabilitationTeam Conference and Plan of Care Update Date: 10/13/2022   Time: 11:31 AM    Patient Name: Barbara Krause      Medical Record Number: [id number]  Date of Birth: [birth year] Sex: Female         Room/Bed: 4W20C/4W20C-01 Payor Info: Payor: MEDICARE / Plan: MEDICARE PART A AND B / Product Type: *No Product type* /    Admit Date/Time:  10/04/2022  1:58 PM  Primary Diagnosis:  Cerebral infarction involving right cerebellar artery [facility])  Hospital Problems: Principal Problem:   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[place]) Active Problems:   Thrush   Acute cough    Expected Discharge Date: Expected Discharge Date: 10/18/22  Team Members Present: Physician leading conference: Dr. Leeroy Cha Social Worker Present: Erlene Quan, BSW Nurse Present: Dorien Chihuahua, RN PT Present: Tereasa Coop, PT OT Present: Jennefer Bravo, OT SLP Present: Helaine Chess, SLP PPS Coordinator present : Gunnar Fusi, SLP     Current Status/Progress Goal Weekly Team Focus  Bowel/Bladder   Continent with some incontinent episodes   Minimize incontinent episodes   Assist with toileting as needed    Swallow/Nutrition/ Hydration   Sup-Min A for implementation of cough after each bite/sip - D3 + thins - aspirates all consistencies. Does not want any artifical means of nutrition/hydration. Full supervision.   Sup A  implementation of aspiration precautions with intake    ADL's   supervision oral care, set up A UB dressing, min A LB bathing/dressing, CGA to min A toileting   Supervision   d/c planning, sit > stands with anterior weight shift focus, endurance, BUE/core strengthening, family ed    Mobility   bed mobility supervision, transfers with R PFRW min/mod A, gait 120f with R PFRW and CGA/min A, 4 6in steps with 1 handrail and min A   supervision  functional mobility/transfers, generalized strengthening and endurance, dynamic standing  balance/coordination, gait training, stair training, family education, D/C planning    Communication   Min-Mod A   Min A at phrase level - 80% - goal downgraded   speech intelligibility strategy training, EMST    Safety/Cognition/ Behavioral Observations               Pain   No c/o pain   Remain pain free   Assess Qshift and prn    Skin   Skin intact   Remain intact  Assess Qshift and prn      Discharge Planning:  Discharging home with assistance from 2 daughter and aide. HH established . WC cushion and BSC ordered per daughters request   Team Discussion: Patient with PNA confirmed per CT chest ; wet cough. Secretions decreased post scopolamine patch initiation. 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  Patient on target to meet rehab goals: Currently appears to have plateau ed at min assist overall.  Needs mod - max assist for sequencing. Completes upper body care with set up and lower body bathing and dressing with min assist. Needs min - CGA for toileting. Able to transfer using a platform walker and ambulate up to 18-;' with CGA - min assist and manage 4 steps with min assist. Needs supervision - min assist to cough after swallowing and min - mod assist for intellectual tasks.  *See Care Plan and progress notes for long and short-term goals.   Revisions to Treatment Plan:   Declined AC boards Downgraded OT goals Consult to IP Palliative  care   Teaching Needs: Safety, medications, dietary modifications/interventions, transfers, toileting, etc.   Current Barriers to Discharge: Decreased caregiver support, OP Palliative Care following  Possible Resolutions to Barriers: Family education HH follow up services DME: 3N1 BSC, w/c cushion, suction machine     Medical Summary Current Status: more fatigued today, insomnia, pneumonia, elevated BUN, freezine episodes with walking, increased  chorea, drooling  Barriers to Discharge: Medical stability  Barriers to Discharge Comments: more fatigued today, insomnia, pneumonia, elevated BUN, freezine episodes with walking, increased chorea, drooling Possible Resolutions to [employer] Focus: quinolone started for pneumonia, fluids orderd for elevated BUN, palliative care consult ordered to discuss goals of care, scopolamine patch ordered, discuss aspiration risks with oral diet   Continued Need for Acute Rehabilitation Level of Care: The patient requires daily medical management by a physician with specialized training in physical medicine and rehabilitation for the following reasons: Direction of a multidisciplinary physical rehabilitation program to maximize functional independence : Yes Medical management of patient stability for increased activity during participation in an intensive rehabilitation regime.: Yes Analysis of laboratory values and/or radiology reports with any subsequent need for medication adjustment and/or medical intervention. : Yes   I attest that I was present, lead the team conference, and concur with the assessment and plan of the team.   Margarito Liner 10/13/2022, 5:27 PM

## 2022-10-13 NOTE — Progress Notes (Addendum)
Occupational Therapy Session Note  Patient Details  Name: Barbara Krause MRN: [id number] Date of Birth: [birth year]  Today's Date: 10/13/2022 OT Individual Time: 0905-1000 OT Individual Time Calculation (min): 55 min    Short Term Goals: Week 2:  OT Short Term Goal 1 (Week 2): Continue progressing towards LTG as they remain appropriate  Skilled Therapeutic Interventions/Progress Updates:  Skilled OT intervention completed with focus on ADL retraining and functional endurance. Pt received upright in bed, agreeable to session. No pain reported, however did indicate fatigue from "not sleeping due to coughing and chest-xray in middle of the night." Pt also indicated she had a void accident due to coughing so hard. Therapist checked in with nursing about results of x-ray.  Education provided on IV fluids and trying to void more frequently to prevent "not making it in time" as per report pt was unable to hold urine while walking to bathroom with nursing. Pt agreeable to start session with toileting.  Pt required increased time for all tasks and transfers due to impairments with motor planning, coordination and sequencing. Able to transition to EOB with supervision. Pt requested to walk to bathroom without AD, therefore required initial mod A sit > stand with HHA however faded to min A when therapist instructed pt to push up with L hand from bed. Min-mod A for ambulatory transfer to toilet with slow festinating gait demonstrated with verbal cues needed for R/L stepping pattern for carry through. Min A for doffing pants in stance, then despite time pt was unable to void on commode however reported she went about 30 mins ago with nursing. Threaded underwear with total A, then min A for over hips in stance. CGA stand pivot to w/c.  Seated at sink, pt required min A to doff shirt, mod A to donn new one with hemi-technique. Threaded pants with figure 4 and bending forward method with overall min A for over  hips in stance without AD, with cues needed not to WB through R hand/wrist on sink as elbow is only appropriate. Oral care at sink with pt demonstrating good carryover of hemi-technique for toothbrush and toothpaste application, though pt declined suction toothbrush. Supervision for oral care overall with cues needed for swish and spit vs swallow from cup.  Completed the following BUE AROM exercises to promote functional use of BUE needed for UB dressing and other ADLs: (LUE)- 2x10 shoulder flexion, elbow flexion (with pt lacking about 15 degrees of extension from prior fracture) (RUE)- 2x10 shoulder flexion  Pt remained seated in w/c, with belt alarm on/activated, and with all needs in reach at end of session.   Therapy Documentation Precautions:  Precautions Precautions: Fall Restrictions Weight Bearing Restrictions: Yes RUE Weight Bearing: Non weight bearing Other Position/Activity Restrictions: RT Wrist fracture ~3 weeks ago    Therapy/Group: Individual Therapy  Blase Mess, MS, OTR/L  10/13/2022, 11:35 AM

## 2022-10-13 NOTE — Progress Notes (Signed)
Physical Therapy Session Note  Patient Details  Name: Barbara Krause MRN: [id number] Date of Birth: [birth year]  Today's Date: 10/13/2022 PT Individual Time: 1501-1539 PT Individual Time Calculation (min): 38 min   Short Term Goals: Week 2:  PT Short Term Goal 1 (Week 2): STG=LTG due to LOS  Skilled Therapeutic Interventions/Progress Updates:      Therapy Documentation Precautions:  Precautions Precautions: Fall Restrictions Weight Bearing Restrictions: Yes RUE Weight Bearing: Non weight bearing Other Position/Activity Restrictions: RT Wrist fracture ~3 weeks ago  Pt agreeable to PT session. [name] daughter, Joslyn Devon, present and pt without verbal reports in session. Pt requires min A for bed mobility with bed features. [name] daughter reports pt has a separate room in bathroom for toilet that is not walker or w/c accessible. PT attempted to see pt perform sit to stand without AD to determine if pt able to perform. Pt with unsuccessful sit to stand attempt with max A from PT. PT encouraged pt to take a rest prior to additional sit to stand attempt. While sitting edge of bed, pt assumed forward trunk flexion position. PT and pt's daughter verbally checked in with patient. Pt did not respond and PT provided total A to return pt to midline. PT assessed vitals: (pulse 72, O2: 95, BP: 119/78 (90)). Pt declined dizziness, lightheadedness, palpitations, pain, numbness, or tingling. Pt reported blurred vision which pt states occurs at baseline. Nurse notified and reached out to PA.   Pt reported feeling okay and requested to utilize restroom. Pt required min A for sit to stand with PFRW and gait ~8 feet to toilet. Pt requires mod A for stand to sit due to decreased eccentric control. Pt requires min A for toileting and returned to bed with all needs in reach, bed alarm on and daughter present.    Therapy/Group: Individual Therapy  Verl Dicker Verl Dicker PT, DPT  10/13/2022, 7:58 AM

## 2022-10-14 LAB — CBC WITH DIFFERENTIAL/PLATELET
Abs Immature Granulocytes: 0.04 10*3/uL (ref 0.00–0.07)
Basophils Absolute: 0 10*3/uL (ref 0.0–0.1)
Basophils Relative: 0 %
Eosinophils Absolute: 0 10*3/uL (ref 0.0–0.5)
Eosinophils Relative: 0 %
HCT: 36.9 % (ref 36.0–46.0)
Hemoglobin: 12 g/dL (ref 12.0–15.0)
Immature Granulocytes: 0 %
Lymphocytes Relative: 13 %
Lymphs Abs: 1.3 10*3/uL (ref 0.7–4.0)
MCH: 30.5 pg (ref 26.0–34.0)
MCHC: 32.5 g/dL (ref 30.0–36.0)
MCV: 93.9 fL (ref 80.0–100.0)
Monocytes Absolute: 0.8 10*3/uL (ref 0.1–1.0)
Monocytes Relative: 8 %
Neutro Abs: 7.9 10*3/uL — ABNORMAL HIGH (ref 1.7–7.7)
Neutrophils Relative %: 79 %
Platelets: 188 10*3/uL (ref 150–400)
RBC: 3.93 MIL/uL (ref 3.87–5.11)
RDW: 13.3 % (ref 11.5–15.5)
WBC: 10.1 10*3/uL (ref 4.0–10.5)
nRBC: 0 % (ref 0.0–0.2)

## 2022-10-14 NOTE — Progress Notes (Signed)
Ok to add 5d stop date for levofloxacin for PNA per Dr. Ranell Patrick.  Onnie Boer, PharmD, BCIDP, AAHIVP, CPP Infectious Disease Pharmacist 10/14/2022 4:32 PM

## 2022-10-14 NOTE — Progress Notes (Signed)
Patient ID: Barbara Krause, female   DOB: 09-Jun-1941, 81 y.o.   MRN: [id number]  Team Conference Report to Patient/Family  Team Conference discussion was reviewed with the patient and caregiver, including goals, any changes in plan of care and target discharge date.  Patient and caregiver express understanding and are in agreement.  The patient has a target discharge date of 10/18/22.  SW spoke with pt daughter, Barbara Krause and discussed conference updates. Patient anticipating d/c on Monday. Daughter has confirmed HC will come out on 12/19. HH established with [facility], [place] anticipated 12/19. Patient started ABX. Daughter preparing to go out of town and return on Monday. Patient daughter, Barbara Krause will be present this weekend. No additional questions or concerns. Dyanne Iha 10/14/2022, 10:35 AM

## 2022-10-14 NOTE — Progress Notes (Signed)
Physical Therapy Discharge Summary  Patient Details  Name: Barbara Krause MRN: [id number] Date of Birth: Jan 10, 1941  Date of Discharge from PT service:October 17, 2022  Today's Date: 10/17/2022 PT Individual Time: 0920-1018 PT Individual Time Calculation (min): 58 min   Patient has met 6 of 8 long term goals due to improved activity tolerance, improved balance, improved postural control, increased strength, increased range of motion, ability to compensate for deficits, improved attention, improved awareness, and improved coordination. Patient to discharge at an ambulatory level Supervision using R PFRW. Patient's care partner is independent to provide the necessary physical and cognitive assistance at discharge. [name] daughter (who is an OT) was present for hands on family education - emphasis on stair navigation and was cleared to assist pt with transfers in room.   Reasons goals not met: Pt did not meet stair goal of 3 steps with 2 rails and CGA as pt currently requires min A to navigate 8 steps with 1 rail due to weakness, decreased balance, and impaired sequencing. Pt also did not meet car transfer goal of min A, as pt currently requires mod A due to posterior lean, difficulty sequencing, and generalized weakness.   Recommendation:  Patient will benefit from ongoing skilled PT services in home health setting to continue to advance safe functional mobility, address ongoing impairments in transfers, generalized strengthening and endurance, dynamic standing balance/coordination, gait training, NMR, and to minimize fall risk.  Equipment: 16x18 cushion for WC - already has PFRW and WC  Reasons for discharge: treatment goals met and discharge from hospital  Patient/family agrees with progress made and goals achieved: Yes  PT Discharge Precautions/Restrictions Restrictions Weight Bearing Restrictions: Yes RUE Weight Bearing: Non weight bearing Pain Interference   Cognition Orientation  Level: Oriented X4 Sensation   Motor     Mobility   Locomotion  Pick up small object from the floor assist level: Minimal Assistance - Patient > 75% Pick up small object from the floor assistive device: small cup with R PFRW  Trunk/Postural Assessment     Balance   Extremity Assessment        Simone Curia PT, DPT  10/17/2022, 12:42 PM

## 2022-10-14 NOTE — Progress Notes (Signed)
Occupational Therapy Session Note  Patient Details  Name: Barbara Krause MRN: [id number] Date of Birth: 1940-11-19  Today's Date: 10/14/2022 OT Individual Time: 1335-1415 OT Individual Time Calculation (min): 40 min    Short Term Goals: Week 2:  OT Short Term Goal 1 (Week 2): Continue progressing towards LTG as they remain appropriate  Skilled Therapeutic Interventions/Progress Updates:  Skilled OT intervention completed with focus on standing tolerance, balance without UE support, cognitive sequencing all needed to promote independence with BADL at a higher level of function. Pt received seated in recliner, agreeable to session. No pain reported.  Declined self-care needs this session and agreeable to go to gym. Completed sit > stand with CGA, then CGA with HHA few step transfer to w/c. Dependent transfer <> gym for energy conservation.  CGA sit > stands and CGA balance without UE support at high table, to participate in qwirkle game with sorting by color then shape. No difficulty by color, however on first trial only able to maintain stance for about 1 min prior to needed rest break, but on 2-4 trials able to stand for >5 mins. Pt needed mod verbal cues and min tactile cues to correct anterior drift in stance on more than 5 accounts as fatigue increased, but pt was able to correct with only CGA. Intermittent seated rest breaks needed for fatigue.  Pt remained seated in w/c, with belt alarm on/activated, and with all needs in reach at end of session.   Therapy Documentation Precautions:  Precautions Precautions: Fall Restrictions Weight Bearing Restrictions: Yes RUE Weight Bearing: Non weight bearing Other Position/Activity Restrictions: RT Wrist fracture ~3 weeks ago    Therapy/Group: Individual Therapy  Blase Mess, MS, OTR/L  10/14/2022, 3:36 PM

## 2022-10-14 NOTE — Plan of Care (Signed)
  Problem: Sit to Stand Goal: LTG:  Patient will perform sit to stand in prep for activites of daily living with assistance level (OT) Description: LTG:  Patient will perform sit to stand in prep for activites of daily living with assistance level (OT) Flowsheets (Taken 10/14/2022 0757) LTG: PT will perform sit to stand in prep for activites of daily living with assistance level: (downgraded due to deficits in balance, motor planning, initiation, and generalized endurance) Contact Guard/Touching assist   Problem: RH Grooming Goal: LTG Patient will perform grooming w/assist,cues/equip (OT) Description: LTG: Patient will perform grooming with assist, with/without cues using equipment (OT) Flowsheets (Taken 10/14/2022 0757) LTG: Pt will perform grooming with assistance level of: (downgraded due to deficits in balance, motor planning, initiation, and generalized endurance) Supervision/Verbal cueing   Problem: RH Bathing Goal: LTG Patient will bathe all body parts with assist levels (OT) Description: LTG: Patient will bathe all body parts with assist levels (OT) Flowsheets (Taken 10/14/2022 0757) LTG: Pt will perform bathing with assistance level/cueing: (downgraded due to deficits in balance, motor planning, initiation, and generalized endurance) Minimal Assistance - Patient > 75%   Problem: RH Dressing Goal: LTG Patient will perform upper body dressing (OT) Description: LTG Patient will perform upper body dressing with assist, with/without cues (OT). Flowsheets (Taken 10/14/2022 0757) LTG: Pt will perform upper body dressing with assistance level of: (downgraded due to deficits in balance, motor planning, initiation, and generalized endurance) Supervision/Verbal cueing Goal: LTG Patient will perform lower body dressing w/assist (OT) Description: LTG: Patient will perform lower body dressing with assist, with/without cues in positioning using equipment (OT) Flowsheets (Taken 10/14/2022  0757) LTG: Pt will perform lower body dressing with assistance level of: (downgraded due to deficits in balance, motor planning, initiation, and generalized endurance) Contact Guard/Touching assist   Problem: RH Toileting Goal: LTG Patient will perform toileting task (3/3 steps) with assistance level (OT) Description: LTG: Patient will perform toileting task (3/3 steps) with assistance level (OT)  Flowsheets (Taken 10/14/2022 0757) LTG: Pt will perform toileting task (3/3 steps) with assistance level: (downgraded due to deficits in balance, motor planning, initiation, and generalized endurance) Contact Guard/Touching assist   Problem: RH Toilet Transfers Goal: LTG Patient will perform toilet transfers w/assist (OT) Description: LTG: Patient will perform toilet transfers with assist, with/without cues using equipment (OT) Flowsheets (Taken 10/14/2022 0757) LTG: Pt will perform toilet transfers with assistance level of: (downgraded due to deficits in balance, motor planning, initiation, and generalized endurance) Contact Guard/Touching assist   Problem: RH Tub/Shower Transfers Goal: LTG Patient will perform tub/shower transfers w/assist (OT) Description: LTG: Patient will perform tub/shower transfers with assist, with/without cues using equipment (OT) Flowsheets (Taken 10/14/2022 0757) LTG: Pt will perform tub/shower stall transfers with assistance level of: (downgraded due to deficits in balance, motor planning, initiation, and generalized endurance) Contact Guard/Touching assist

## 2022-10-14 NOTE — Plan of Care (Signed)
  Problem: Sit to Stand Goal: LTG:  Patient will perform sit to stand with assistance level (PT) Description: LTG:  Patient will perform sit to stand with assistance level (PT) Flowsheets (Taken 10/14/2022 0943) LTG: PT will perform sit to stand in preparation for functional mobility with assistance level: (downgraded due to weakness, decreased balance, and impaired motor planning) Contact Guard/Touching assist Note: downgraded due to weakness, decreased balance, and impaired motor planning    Problem: RH Car Transfers Goal: LTG Patient will perform car transfers with assist (PT) Description: LTG: Patient will perform car transfers with assistance (PT). Flowsheets (Taken 10/14/2022 0943) LTG: Pt will perform car transfers with assist:: (downgraded due to weakness, decreased balance, and impaired motor planning) Minimal Assistance - Patient > 75% Note: downgraded due to weakness, decreased balance, and impaired motor planning    Problem: RH Stairs Goal: LTG Patient will ambulate up and down stairs w/assist (PT) Description: LTG: Patient will ambulate up and down # of stairs with assistance (PT) Flowsheets (Taken 10/14/2022 0943) LTG: Pt will ambulate up/down stairs assist needed:: (downgraded due to weakness, decreased balance, and impaired motor planning) Contact Guard/Touching assist LTG: Pt will  ambulate up and down number of stairs: 3 steps with 2 handrails Note: downgraded due to weakness, decreased balance, and impaired motor planning

## 2022-10-14 NOTE — Progress Notes (Signed)
Physical Therapy Session Note  Patient Details  Name: Barbara Krause MRN: [id number] Date of Birth: 1940/11/25  Today's Date: 10/14/2022 PT Individual Time: [phone] and 1415-1455 PT Individual Time Calculation (min): 70 min and 40 min   Short Term Goals: Week 1:  PT Short Term Goal 1 (Week 1): pt will transfer sit<>stand with LRAD and CGA PT Short Term Goal 1 - Progress (Week 1): Progressing toward goal PT Short Term Goal 2 (Week 1): pt will transfer bed<>chair with LRAD and CGA PT Short Term Goal 2 - Progress (Week 1): Met PT Short Term Goal 3 (Week 1): pt will ambulate 46f with LRAD and CGA PT Short Term Goal 3 - Progress (Week 1): Met Week 2:  PT Short Term Goal 1 (Week 2): STG=LTG due to LOS  Skilled Therapeutic Interventions/Progress Updates:   Treatment Session 1 Received pt sidelying in bed asleep, upon wakening pt agreeable to PT treatment, and denied any pain during session. Session with emphasis on functional mobility/transfers, toileting, generalized strengthening and endurance, dynamic standing balance/coordination, and gait training. [name] speech more clear and no drooling noted since receiving patch yesterday. Pt reported urge to void and transferred sidelying<>sitting EOB with supervision from mostly flat bed and donned shoes with max A. Stood from EOB with R PFRW and mod A due to posterior lean and ambulated in/out of bathroom with R PFRW and CGA/light min A with assist to steer RW over threshold and when turning to sit on commode. Pt able to void and perform hygiene management without assist but required min/mod A for clothing management. Stood from toilet with bedside commode over top with CGA and [facility] and [facility].   Went through pain interference questionnaire, sensation, and MMT. [facility] and ambulated 12104fwith R PFRW and close supervision - intermittent cues to remain closer to RW. Pt able to stand and pick up small  cup from floor using R PFRW and min A for balance. Pt then stood from mat without AD and mod A and ambulated 8060fithout AD and min/mod A with therapist supporting R elbow. Pt with significant trunk flexion and multiple LOB - pt declined attempting again due to fatigue. Worked on technique with sit<>stands without AD x8 with min/mod A overall - emphasis on eccentric control when sitting. Pt required mod cues for anterior weight shifting.   Of note, pt with significant trunk flexion/forward lean while sitting on mat throughout session - pt reports this is baseline from Huntington's and was able to self-correct with cues  Pt performed standing alternating toe taps to 3in step 2x10 on RLE and 1x4 and 1x10 on LLE prior to losing balance posteriorly onto mat. Pt required mod A overall for balance with 1 L lateral LOB requiring max A for recovery. Therapist supporting R elbow and pt leaning forward heavily onto therapist for support, requiring max cues for upright posture. Transitioned to mini-squats 2x8 with R PFRW and min A overall for balance - cues for upright posture/gaze and overall technique, although pt with increased difficulty sequencing movements. [facility] Concluded session with pt sitting in WC, needs within reach, and seatbelt alarm on.   Treatment Session 2 Received pt sitting in WC, pt agreeable to PT treatment, and denied any pain during session but reported fatigue. Session with emphasis on functional mobility/transfers, generalized strengthening and endurance, dynamic standing balance/coordination, stair navigation, and gait training. Pt transported  to main gym in [facility] for time management purposes. Pt navigated 8 6in steps with 1 handrail and min A ascending and descending with a step to pattern - therapist supporting under R elbow. Pt with increased difficulty when descending steps with generalized unsteadiness and LOB. Pt fatigued afterwards,  stating "that was hard". Stood with R PFRW and min A and ambulated 153f with R PFRW and supervision back to room - min cues to increase step length and [place] requested to sit in recliner and ambulated additional 583fwith R PRFW and supervision to recliner. Pt stood from recliner x4 trials with heavy min A and performed the following standing exercises with emphasis on LE strength/ROM - required seated rest break in between exercises: -single leg marches x20 bilaterally -hip abduction x15 bilaterally -heel raises x15 -hip extension x15 bilaterally  Concluded session with pt sitting in recliner, needs within reach, and seatbelt alarm on.   Therapy Documentation Precautions:  Precautions Precautions: Fall Restrictions Weight Bearing Restrictions: Yes RUE Weight Bearing: Non weight bearing Other Position/Activity Restrictions: RT Wrist fracture ~3 weeks ago  Therapy/Group: Individual Therapy AnAlfonse AlpersT, DPT  10/14/2022, 6:59 AM

## 2022-10-14 NOTE — Progress Notes (Signed)
Speech Language Pathology Daily Session Note  Patient Details  Name: Barbara Krause MRN: [id number] Date of Birth: [birth year]  Today's Date: 10/14/2022 SLP Individual Time: 1020-1100 SLP Individual Time Calculation (min): 40 min  Short Term Goals: Week 2: SLP Short Term Goal 1 (Week 2): STG's = LTG's due to ELOS  Skilled Therapeutic Interventions: Pt seen this date for skilled ST intervention targeting swallowing and speech intelligibility  goals outlined in care plan. Pt received awake/alert and OOB in w/c. Agreeable to intervention in hospital room. Improvement in speech intelligibility since initiation of scopolamine patch. Pt reporting improving as well. Reports she slept well last night.   Today's session with emphasis on reinforcement of compensatory strategy training (speech). With Set-Up A, pt completed oral care, at the sink, from her w/c. Continues to decline PO trials, though can verbalize her precautions with Sup A. With Set-Up A, pt performed 3 sets of 5 repetitions on EMST set at 15 cm H2O. Attempted to increase intensity to 20 cm H2O; however, pt unable to complete stating it to be too hard to activate. SLP facilitated speech intelligibility by having pt self-direct tidying/cleaning of her room, at her request. Subjectively, pt was judged to be 80-90% intelligible at the word and short phrase level with Mod I. Attempting to self-correct with use of diaphragmatic breathing techniques intermittently. Benefited from [place] A verbal cues for use of diaphragmatic breathing techniques during EMST trials. Additionally, benefited from verbal cues to intermittently produce volitional cough to clear wet vocal quality.   Pt transferred from w/c to recliner chair, at her request, with Min A. All safety measures activated and call bell within reach. Continue per current ST POC.  Pain No pain reported; NAD  Therapy/Group: Individual Therapy  Rodger Giangregorio A Branden Vine 10/14/2022, 1:21 PM

## 2022-10-14 NOTE — Progress Notes (Signed)
PROGRESS NOTE   Subjective/Complaints: Midline placed for antibiotics Patient's chart reviewed- No issues reported overnight Vitals signs stable   ROS: denies pain, +cough, +hemauria as per nursing, +shuffling gait, +chorea more present at night  Objective:   DG Chest 2 View  Result Date: 10/12/2022 CLINICAL DATA:  Wet cough EXAM: CHEST - 2 VIEW COMPARISON:  10/08/2022 FINDINGS: Cardiac shadow is mildly enlarged but stable. Aortic calcifications are seen. Lungs are well aerated bilaterally. Persistent density is noted in the left mid lung similar to that seen on recent CT examination again likely representing early infiltrate. No other focal abnormality is noted. Multiple thoracic compression deformities are noted. IMPRESSION: Patchy airspace opacity in the left mid lung similar to that seen on recent CT examination. No new focal abnormality is noted. Electronically Signed   By: Inez Catalina M.D.   On: 10/12/2022 23:29   Recent Labs    10/13/22 0841  WBC 17.4*  HGB 12.9  HCT 37.7  PLT 199   Recent Labs    10/12/22 0515 10/13/22 0841  NA 143 141  K 4.4 4.1  CL 107 105  CO2 28 27  GLUCOSE 96 144*  BUN 27* 25*  CREATININE 0.88 0.87  CALCIUM 9.4 9.0    Intake/Output Summary (Last 24 hours) at 10/14/2022 1233 Last data filed at 10/13/2022 1600 Gross per 24 hour  Intake 100 ml  Output --  Net 100 ml      Pressure Injury 10/08/22 Hand Posterior;Right Stage 2 -  Partial thickness loss of dermis presenting as a shallow open injury with a red, pink wound bed without slough. device related; open wound found under cast on right hand (Active)  10/08/22 1640  Location: Hand  Location Orientation: Posterior;Right  Staging: Stage 2 -  Partial thickness loss of dermis presenting as a shallow open injury with a red, pink wound bed without slough.  Wound Description (Comments): device related; open wound found under cast on  right hand  Present on Admission: Yes    Physical Exam: Vital Signs Blood pressure 112/67, pulse (!) 57, temperature 97.8 F (36.6 C), resp. rate 16, height '5\' 6"'$  (1.676 m), weight 53.2 kg, SpO2 93 %.   General: No acute distress, BMI 20.1 Mood and affect are appropriate HEENT: drawn facies Heart: Bradycardia Lungs: Clear to auscultation, breathing unlabored, no rales or wheezes Abdomen: Positive bowel sounds, soft nontender to palpation, nondistended Extremities: No clubbing, cyanosis, or edema Skin:  no evidence of rash, stage 2 to posterior of right hand Neurologic: Cranial nerves II through XII intact, motor strength is 4/5 in left deltoid, bicep, tricep, grip, hip flexor, knee extensors, ankle dorsiflexor and plantar flexor, increased chorea  Cerebellar exam normal finger to nose to finger as well as heel to shin in LEFT upper and lower extremities RUE NT due to long arm cast, has some skin breakdown due the  Musculoskeletal: Full range of motion in all 4 extremities. No joint swelling, RIght elbow in long arm cast  ModA for sit to stand Ambulating with ModA with PFRW, shuffling gait   Assessment/Plan: 1. Functional deficits which require 3+ hours per day of interdisciplinary therapy in a comprehensive inpatient  rehab setting. Physiatrist is providing close team supervision and 24 hour management of active medical problems listed below. Physiatrist and rehab team continue to assess barriers to discharge/monitor patient progress toward functional and medical goals  Care Tool:  Bathing    Body parts bathed by patient: Right arm, Chest, Abdomen, Front perineal area, Right upper leg, Left upper leg, Face, Right lower leg   Body parts bathed by helper: Left arm, Buttocks     Bathing assist Assist Level: Moderate Assistance - Patient 50 - 74%     Upper Body Dressing/Undressing Upper body dressing   What is the patient wearing?: Pull over shirt    Upper body assist Assist  Level: Supervision/Verbal cueing    Lower Body Dressing/Undressing Lower body dressing      What is the patient wearing?: Underwear/pull up, Pants     Lower body assist Assist for lower body dressing: Moderate Assistance - Patient 50 - 74%     Toileting Toileting    Toileting assist Assist for toileting: Minimal Assistance - Patient > 75%     Transfers Chair/bed transfer  Transfers assist     Chair/bed transfer assist level: Contact Guard/Touching assist     Locomotion Ambulation   Ambulation assist      Assist level: Supervision/Verbal cueing Assistive device: Walker-platform Max distance: 11f   Walk 10 feet activity   Assist     Assist level: Supervision/Verbal cueing Assistive device: Walker-rolling   Walk 50 feet activity   Assist Walk 50 feet with 2 turns activity did not occur: Safety/medical concerns (fatigue and generalized weakness)  Assist level: Supervision/Verbal cueing Assistive device: Walker-rolling    Walk 150 feet activity   Assist Walk 150 feet activity did not occur: Safety/medical concerns (fatigue and generalized weakness)  Assist level: Minimal Assistance - Patient > 75% Assistive device: Walker-platform    Walk 10 feet on uneven surface  activity   Assist     Assist level: Moderate Assistance - Patient - 50 - 74% Assistive device: Walker-platform   Wheelchair     Assist Is the patient using a wheelchair?: Yes Type of Wheelchair: Manual    Wheelchair assist level: Dependent - Patient 0%      Wheelchair 50 feet with 2 turns activity    Assist        Assist Level: Dependent - Patient 0%   Wheelchair 150 feet activity     Assist      Assist Level: Dependent - Patient 0%   Blood pressure 112/67, pulse (!) 57, temperature 97.8 F (36.6 C), resp. rate 16, height '5\' 6"'$  (1.676 m), weight 53.2 kg, SpO2 93 %.  Medical Problem List and Plan: 1. 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 Discussed with daughter  Labs reviewed  Plan for d/c home with daughter on Monday 2.  Impaired mobility: continue Lovenox since ambulating 90 feet, Mod A sit to stand             -antiplatelet therapy: Aspirin 81 mg daily and Plavix 75 mg daily x 3 weeks then aspirin alone 3. Pain Management: Tylenol  as needed 4. Depression: continue Aricept 5 mg daily, Lexapro 20 mg daily             -antipsychotic agents: N/A 5. Neuropsych/cognition: This patient is capable of making decisions on her own behalf. Oriented with cues to date , remembered B day yesterday  6. Skin/Wound Care: Routine skin checks 7. Fluids/Electrolytes/Nutrition: Routine in and outs with follow-up chemistries 8.  Hypertension.  Cozaar 50 mg daily.  Monitor with increased mobility. Has been well controlled. Klor increased to 46mq daily. Elevated but will hold off on adding medication given bradycardia Vitals:   10/13/22 2001 10/14/22 0539  BP: 111/73 112/67  Pulse: (!) 109 (!) 57  Resp: 15 16  Temp: 99.1 F (37.3 C) 97.8 F (36.6 C)  SpO2: 95% 93%    9.  Hyperlipidemia.  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  [facility]  Continue Deutetrabenazine. Decreased dose to 1 tab per day as per neurologist recommendations given possible side effects of parkisnonian symptoms. Discussed with daughter, she will bring her medication in from home 180  Tobacco use.  NicoDerm patch.  Provide counseling 12.  Incidental findings of right thyroid nodule as well as 1.6 cm left upper lobe groundglass opacity.  Follow-up outpatient. Discussed follow-up in hospital with daughter given her concerns for infectious process, discussion is ongoing 119 Dysphagia, Continue Dys 3 diet, SLP  eval 14. Dementia-mild: continue aricept 15. Cough: CXR reviewed and stable 16. Right wrist distal radius fracture. Followed by [facility] Non weight-bearing on the right wrist , long arm cast  17 Thrush. Diflucan ordered 18. Suboptimal potassium: klor increased to 424m daily, repeat today 19. AMS: WBC obtained and stable 20. Rising BUN: NS bolus ordered. Repeat today.  21. Positive UA: discussed starting antibiotic,UCx neg hold off on abx   Had hematuria w/u in 2022 , + nephrolithiasis, no flank pan c/o but may have become symptomatic with anticoag, monitor Hgb  22. Drooling: continue scopolamine patch, helping.  23. Pneumonia: Quinolone started. Midline placed. Has allergy to PCN. Will ask pharmacy to enter recommended stop date.   LOS: 10 days A FACE TO FACE EVALUATION WAS PERFORMED  KrIzora Ribas2/14/2023, 12:33 PM

## 2022-10-14 NOTE — Progress Notes (Signed)
Nutrition Follow-up  DOCUMENTATION CODES:   Non-severe (moderate) malnutrition in context of chronic illness  INTERVENTION:   Continue Boost Plus po BID, each supplement provides 360 kcal and 14 grams of protein Continue Multivitamin w/ minerals daily Encourage good PO intake   NUTRITION DIAGNOSIS:   Moderate Malnutrition related to chronic illness as evidenced by severe muscle depletion, moderate fat depletion. - Ongoing   GOAL:   Patient will meet greater than or equal to 90% of their needs - Ongoing   MONITOR:   Labs, PO intake, Weight trends, I & O's, Supplement acceptance  REASON FOR ASSESSMENT:   Consult Calorie Count  ASSESSMENT:   81 y.o. 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 PMH includes Huntington's disease, MDD, osteoporosis, breast cancer s/p bilateral mastectomy, HTN, dysphagia, malnutrition, and tobacco abuse.   Pt sitting in chair at time of RD visit. Boost Plus noted on side table, pt drinking out of. Pt reports that she has now been eating and just drinking 2 Boost per day. RD encouraged pt to try and eat dinner, as she normally would at home, pt shook her head no. RD asked pt if she would drink 3 Boost shakes per day since not eating a meal, pt shook her head no and said she was ok with two.   Discussed case with RN, RN reports that pt continues to refuse trays but is drinking her shakes.   Meal Intake 12/05-12/07: 5-60% x 4 meals (average 33%)  Medications reviewed and include: Colace, MVI, Potassium Chloride, IV antibiotics Labs reviewed.  Diet Order:   Diet Order             DIET DYS 3 Room service appropriate? Yes; Fluid consistency: Thin  Diet effective now                   EDUCATION NEEDS:   Education needs have been addressed  Skin:  Skin Assessment: Skin Integrity Issues: Skin Integrity Issues:: Stage II Stage II: R hand  Last BM:  12/11  Height:   Ht Readings from Last 1 Encounters:  10/04/22 '5\' 6"'$  (1.676  m)    Weight:   Wt Readings from Last 1 Encounters:  10/13/22 53.2 kg    Ideal Body Weight:  59.1 kg  BMI:  Body mass index is 18.93 kg/m.  Estimated Nutritional Needs:   Kcal:  1700-1900  Protein:  85-100 grams  Fluid:  >/= 1.7 L    Hermina Barters RD, LDN Clinical Dietitian See [facility] for contact information.

## 2022-10-14 NOTE — Progress Notes (Signed)
   Palliative Medicine Inpatient Follow Up Note HPI: Barbara Krause is a 81 year old right-handed female with history significant for hypertension, breast cancer status post bilateral mastectomy 2009, memory deficit is maintained on Aricept, Huntington's disease followed [facility], osteoporosis, history of tobacco use.    Palliative care has been asked to get involved to discuss goals of care in the setting of progressive disease processes.  Today's Discussion 10/14/2022  *Please note that this is a verbal dictation therefore any spelling or grammatical errors are due to the "[place]" system interpretation.  Chart reviewed inclusive of vital signs, progress notes, laboratory results, and diagnostic images.   I checked in with [name] that early evening. She was sitting up in her chair in good spirits. She shares that she has had an excellent day.   Reviewed her present situation - she feels confident that she has the support in her home she needs at this time.   Continues to be agreeable with OP Palliative support.   Questions and concerns addressed/Palliative Support Provided.   Objective Assessment: Vital Signs Vitals:   10/14/22 0539 10/14/22 1320  BP: 112/67 109/69  Pulse: (!) 57 65  Resp: 16 17  Temp: 97.8 F (36.6 C) 98.8 F (37.1 C)  SpO2: 93% 93%    Intake/Output Summary (Last 24 hours) at 10/14/2022 1632 Last data filed at 10/14/2022 1500 Gross per 24 hour  Intake 600 ml  Output --  Net 600 ml   Last Weight  Most recent update: 10/13/2022  4:02 AM    Weight  53.2 kg (117 lb 4.6 oz)            Gen:  Frail Elderly Caucasian F in NAD HEENT: moist mucous membranes CV: Regular rate and rhythm  PULM: ON Ra, breathing even and nonlabored ABD: soft/nontender/  EXT: No edema  Neuro: Alert and oriented x3   SUMMARY OF RECOMMENDATIONS   DNAR/DNI   DNR/MOST/Advance Directives in [place] support will continue as an OP  though [facility]   Goals to go home w/ inc. Home support   PMT will continue to follow incrementally  Billing based on MDM: Low  ______________________________________________________________________________________ [place] Team Team Cell Phone: [phone] Please utilize secure chat with additional questions, if there is no response within 30 minutes please call the above phone number  Palliative Medicine Team providers are available by phone from 7am to 7pm daily and can be reached through the team cell phone.  Should this patient require assistance outside of these hours, please call the patient's attending physician.

## 2022-10-15 MED ORDER — LEVOFLOXACIN 500 MG PO TABS
500.0000 mg | ORAL_TABLET | Freq: Every day | ORAL | Status: AC
  Administered 2022-10-15 – 2022-10-17 (×3): 500 mg via ORAL
  Filled 2022-10-15 (×3): qty 1

## 2022-10-15 NOTE — Progress Notes (Signed)
Physical Therapy Session Note  Patient Details  Name: Barbara Krause MRN: [id number] Date of Birth: 1941/10/16  Today's Date: 10/15/2022 PT Individual Time: 1100-1200 PT Individual Time Calculation (min): 60 min   Short Term Goals: Week 1:  PT Short Term Goal 1 (Week 1): pt will transfer sit<>stand with LRAD and CGA PT Short Term Goal 1 - Progress (Week 1): Progressing toward goal PT Short Term Goal 2 (Week 1): pt will transfer bed<>chair with LRAD and CGA PT Short Term Goal 2 - Progress (Week 1): Met PT Short Term Goal 3 (Week 1): pt will ambulate 37f with LRAD and CGA PT Short Term Goal 3 - Progress (Week 1): Met Week 2:  PT Short Term Goal 1 (Week 2): STG=LTG due to LOS  Skilled Therapeutic Interventions/Progress Updates:  Patient greeted sitting in bedside recliner and agreeable to PT treatment session. Patient denied pain at this time and demonstrated improved vocal production throughout treatment session with minor drooling noted.   Patient performed sit/stand from recliner chair with R PFRW and MinA for initiating standing secondary to posterior lean and low/deep surface to stand from. Patient then performed stand pivot transfer to wheelchair R PFRW with CGA.   Patient wheeled to ortho gym for time management. Patient performed car transfer with R PFRW and CGA/MinA- Once seated in the car simulator, patient was able to place B LE into/out of the car, however required MinA for standing from the car secondary to decreased anterior weight shift and poor L hand placement.   Patient then ascended/descended a low grade ramp with R PFRW and CGA for safety- VC for decreased cadence when descending in order to improve overall stability and for stepping within the frame of the RW with good improvements noted.  Patient ascended/descended x4 steps, x2 trials with R HR and CGA/MinA for improved stability- Patient utilized a step-to pattern leading with L LE when ascending and R LE when  descending. Patient required MinA for improved stability when descending secondary to poor eccentric control. Seated rest break in between trials secondary to fatigue.   Patient gait trained x125', x156' with R PFRW and CGA for safety- Patient required increased time to complete each gait trial with VC for stepping within the frame of the walker. Patient required standing rest breaks in order to re-set and improve gait mechanics with good improvements noted.   Patient returned to her room and performed sit/stand with R PFRW and MinA for improved anterior weight shift- Patient then performed stand pivot transfer to bedside recliner with PBaltaand CGA for safety. Patient left sitting in bedside recliner with posey belt on, call bell within reach and all needs met.    Therapy Documentation Precautions:  Precautions Precautions: Fall Restrictions Weight Bearing Restrictions: Yes RUE Weight Bearing: Non weight bearing Other Position/Activity Restrictions: RT Wrist fracture ~3 weeks ago   Therapy/Group: Individual Therapy  Edla Para 10/15/2022, 7:56 AM

## 2022-10-15 NOTE — Discharge Summary (Signed)
Physician Discharge Summary  Patient ID: Barbara Krause MRN: [id number] DOB/AGE: [birth year] 81 y.o.  Admit date: 10/04/2022 Discharge date: 10/18/2022  Discharge Diagnoses:  Principal Problem:   Cerebral infarction involving right cerebellar artery [facility]) Active Problems:   Thrush   Acute cough DVT prophylaxis Hypertension Mood stabilization Hyperlipidemia Huntington's disease Tobacco use Incidental findings of right thyroid nodule as well as 1.6 cm left upper lobe groundglass opacity Dysphagia Right wrist distal radius fracture Breast cancer status post bilateral mastectomy Osteoporosis Pneumonia  Discharged Condition: Stable  Significant Diagnostic Studies: DG Swallowing Func-Speech Pathology  Result Date: 10/13/2022 Table formatting from the original result was not included. Objective Swallowing Evaluation: Type of Study: MBS-Modified Barium Swallow Study  Patient Details Name: Barbara Krause MRN: [id number] Date of Birth: [birth year] Today's Date: 10/13/2022 Time: SLP Start Time (ACUTE ONLY): 3 -SLP Stop Time (ACUTE ONLY): 1440 SLP Time Calculation (min) (ACUTE ONLY): 30 min Past Medical History: Past Medical History: Diagnosis Date  Breast cancer ([place]) 2009  bilateral  Depression   History of chicken pox   History of colon polyps   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[place])   Kidney stone   Osteoporosis  Past Surgical History: Past Surgical History: Procedure Laterality Date  CESAREAN SECTION    1977 and [place] Bilateral 2009 HPI: Patient is a 81  y.o. female with PMH: Huntington's disease, h/o breast cancer s/p total mastectomy, h/o left cerebellar CVA, osteoporosis, depression, dysphagia, dysarthria. She quit smoking two weeks ago after approximately 60 year h/o smoking 1ppd. She presented to the hospital on 10/01/2022 with assessment of slurred speech that had begun six days earlier; she had a fall on Sunday (11/26) due to a syncopal spell during which she struck  her head. Immediately following this fall, she had slurred speech, drooling from left side of her mouth and drowsiness which progressed as the week went on. MRI in ED revealed diffuse cerebral atrophy and an acute punctate ischemic infarction in the right cerebellar hemisphere.  Subjective: pleasant, daughter in room  Recommendations for follow up therapy are one component of a multi-disciplinary discharge planning process, led by the attending physician.  Recommendations may be updated based on patient status, additional functional criteria and insurance authorization. Assessment / Plan / Recommendation   10/07/2022  12:55 PM Clinical Impressions Clinical Impression MBSS completed secondary to reports of acute on chronic multi-phase dysphagia in the setting of Huntington's Disease and acute cerebellar CVA. Per instrumental findings this date, pt presents with worsening multi-phase dysphagia (severe vs moderate-severe in August 2023), which resulted in persistent silent penetration with resultant aspiration of all consistencies assessed (thin liquid through puree) on each trial. Only texture that was not aspirated was Dysphagia 3, though was only tested once for time purposes. Aspiration was noted to be trace to mild-moderate in volume with no compensatory methods appearing beneficial. Chin tuck and head turn did not eliminate aspiration events, though chin tuck with 5 cc bolus did appear to reduce the volume of aspirated material inconsistently at best. Cued cough only cleared a portion of aspirated material from trachea. Pharyngeal stasis was diffuse and cleared with secondary swallow. Oral phase was complicated by oral holding, piecemeal deglutition, premature spillage, and decreased sensory awareness of residuals. Continues to present with esophageal component, though does not appear to be the primary cause of pt's dysphagia sx's. Please see full radiology report for further details.  Given instrumental  observations and pt preferences to remain on least restrictive diet and avoid alternative  means of nutrition/hydration short and long-term, recommend continuation of current diet textures that are cut into very small pieces or pureed textures to mitigate choking risk and thin liquids (water is best). Pt appears more sensate to aspiration and produces reflexive cough response with smaller sips; therefore, may continue with Provale cup if desired. If pt and family wish to ever pursue more conservative measures, recommend NPO with alternate means of nutrition and hydration. Do not anticipate pt's swallow function to improve much, if any. Education provided re: risks and ramifications of aspiration to include PNA, illness, and pulmonary compromise; pt reports she her wishes to remain the same (no AMON and least restrictive diet). MD aware and states to hold pt's diet as is given her wishes. Furthermore, recommend Palliative Care consult to further confirm pt and families decision re: nutrition and hydration. Fortunately, pt's vital signs, WBC, and CXR have all been stable.  SLP Visit Diagnosis Dysphagia, oropharyngeal phase (R13.12);Cognitive communication deficit (R41.841);Dysarthria and anarthria (R47.1) Impact on safety and function Severe aspiration risk;Risk for inadequate nutrition/hydration     10/02/2022   4:25 PM Treatment Recommendations Treatment Recommendations Therapy as outlined in treatment plan below     10/07/2022  12:56 PM Prognosis Prognosis for Safe Diet Advancement Guarded   10/12/2022  12:58 PM Diet Recommendations Compensations Slow rate;Small sips/bites;Follow solids with liquid;Hard cough after swallow;Multiple dry swallows after each bite/sip     10/07/2022  12:55 PM Other Recommendations Oral Care Recommendations Oral care QID;Staff/trained caregiver to provide oral care;Oral care before and after PO   10/02/2022   4:25 PM Frequency and Duration  Speech Therapy Frequency (ACUTE ONLY) min 1 x/week  Treatment Duration 1 week     10/07/2022  12:49 PM Oral Phase Oral Phase Impaired Oral - Honey Teaspoon Weak lingual manipulation;Reduced posterior propulsion;Piecemeal swallowing;Delayed oral transit;Decreased bolus cohesion;Holding of bolus;Premature spillage Oral - Honey Cup Weak lingual manipulation;Reduced posterior propulsion;Delayed oral transit;Decreased bolus cohesion;Premature spillage;Holding of bolus;Piecemeal swallowing Oral - Nectar Teaspoon Weak lingual manipulation;Reduced posterior propulsion;Delayed oral transit;Decreased bolus cohesion;Premature spillage;Holding of bolus Oral - Nectar Cup Weak lingual manipulation;Piecemeal swallowing;Delayed oral transit;Decreased bolus cohesion;Premature spillage;Reduced posterior propulsion Oral - Nectar Straw NT Oral - Thin Teaspoon Holding of bolus;Right anterior bolus loss;Weak lingual manipulation;Piecemeal swallowing;Delayed oral transit;Decreased bolus cohesion;Premature spillage Oral - Thin Cup Weak lingual manipulation;Reduced posterior propulsion;Piecemeal swallowing;Delayed oral transit;Decreased bolus cohesion;Premature spillage;Right anterior bolus loss Oral - Thin Straw Weak lingual manipulation;Reduced posterior propulsion;Delayed oral transit;Decreased bolus cohesion;Premature spillage;Holding of bolus Oral - Puree Weak lingual manipulation;Holding of bolus;Delayed oral transit;Premature spillage Oral - Mech Soft Weak lingual manipulation;Piecemeal swallowing;Decreased bolus cohesion;Delayed oral transit Oral - Regular NT Oral - Multi-Consistency NT Oral - Pill NT    10/07/2022  12:51 PM Pharyngeal Phase Pharyngeal Phase Impaired Pharyngeal- Honey Teaspoon Reduced pharyngeal peristalsis;Reduced anterior laryngeal mobility;Penetration/Aspiration during swallow;Trace aspiration;Moderate aspiration;Pharyngeal residue - valleculae;Pharyngeal residue - pyriform;Pharyngeal residue - posterior pharnyx;Lateral channel residue;Reduced airway/laryngeal  closure;Reduced laryngeal elevation;Reduced tongue base retraction Pharyngeal Material enters airway, passes BELOW cords without attempt by patient to eject out (silent aspiration) Pharyngeal- Honey Cup Reduced pharyngeal peristalsis;Reduced epiglottic inversion;Reduced anterior laryngeal mobility;Reduced laryngeal elevation;Reduced airway/laryngeal closure;Reduced tongue base retraction;Penetration/Aspiration during swallow;Moderate aspiration;Pharyngeal residue - pyriform;Pharyngeal residue - posterior pharnyx;Pharyngeal residue - valleculae;Lateral channel residue Pharyngeal Material enters airway, passes BELOW cords without attempt by patient to eject out (silent aspiration) Pharyngeal- Nectar Teaspoon Delayed swallow initiation-vallecula;Reduced laryngeal elevation;Moderate aspiration;Pharyngeal residue - posterior pharnyx;Pharyngeal residue - valleculae;Reduced airway/laryngeal closure;Reduced pharyngeal peristalsis;Reduced tongue base retraction;Pharyngeal residue - pyriform;Inter-arytenoid space residue;Lateral channel residue;Penetration/Aspiration during swallow;Reduced  anterior laryngeal mobility;Pharyngeal residue - cp segment;Reduced epiglottic inversion Pharyngeal Material enters airway, passes BELOW cords without attempt by patient to eject out (silent aspiration) Pharyngeal- Nectar Cup Delayed swallow initiation-vallecula;Reduced pharyngeal peristalsis;Reduced epiglottic inversion;Reduced anterior laryngeal mobility;Reduced laryngeal elevation;Reduced airway/laryngeal closure;Reduced tongue base retraction;Penetration/Aspiration during swallow;Moderate aspiration;Pharyngeal residue - valleculae;Pharyngeal residue - pyriform;Lateral channel residue;Pharyngeal residue - posterior pharnyx Pharyngeal Material enters airway, passes BELOW cords without attempt by patient to eject out (silent aspiration) Pharyngeal- Nectar Straw NT Pharyngeal- Thin Teaspoon Delayed swallow initiation-vallecula;Reduced  pharyngeal peristalsis;Reduced epiglottic inversion;Reduced anterior laryngeal mobility;Reduced laryngeal elevation;Reduced airway/laryngeal closure;Reduced tongue base retraction;Penetration/Aspiration during swallow;Trace aspiration;Pharyngeal residue - valleculae;Pharyngeal residue - pyriform Pharyngeal Material enters airway, passes BELOW cords without attempt by patient to eject out (silent aspiration) Pharyngeal- Thin Cup Delayed swallow initiation-vallecula;Reduced pharyngeal peristalsis;Reduced epiglottic inversion;Reduced anterior laryngeal mobility;Reduced laryngeal elevation;Reduced airway/laryngeal closure;Reduced tongue base retraction;Penetration/Aspiration during swallow;Trace aspiration;Moderate aspiration;Pharyngeal residue - valleculae;Pharyngeal residue - pyriform Pharyngeal Material enters airway, passes BELOW cords without attempt by patient to eject out (silent aspiration) Pharyngeal- Thin Straw Delayed swallow initiation-vallecula;Reduced epiglottic inversion;Reduced pharyngeal peristalsis;Reduced anterior laryngeal mobility;Reduced laryngeal elevation;Reduced airway/laryngeal closure;Reduced tongue base retraction;Penetration/Aspiration during swallow;Trace aspiration;Moderate aspiration;Pharyngeal residue - valleculae;Pharyngeal residue - pyriform Pharyngeal Material enters airway, passes BELOW cords without attempt by patient to eject out (silent aspiration) Pharyngeal- Puree Delayed swallow initiation-vallecula;Reduced pharyngeal peristalsis;Reduced epiglottic inversion;Reduced anterior laryngeal mobility;Reduced laryngeal elevation;Reduced airway/laryngeal closure;Reduced tongue base retraction;Penetration/Aspiration during swallow;Trace aspiration;Pharyngeal residue - valleculae;Pharyngeal residue - pyriform;Pharyngeal residue - posterior pharnyx;Lateral channel residue Pharyngeal Material enters airway, passes BELOW cords without attempt by patient to eject out (silent aspiration)  Pharyngeal- Mechanical Soft Reduced pharyngeal peristalsis;Pharyngeal residue - valleculae Pharyngeal Material does not enter airway Pharyngeal- Regular NT Pharyngeal- Multi-consistency NT Pharyngeal- Pill NT    10/07/2022  12:55 PM Cervical Esophageal Phase  Cervical Esophageal Phase Impaired Cervical Esophageal Comment reduced UES relaxation noted Bethany A Lutes 10/13/2022, 1:27 PM                     DG Chest 2 View  Result Date: 10/12/2022 CLINICAL DATA:  Wet cough EXAM: CHEST - 2 VIEW COMPARISON:  10/08/2022 FINDINGS: Cardiac shadow is mildly enlarged but stable. Aortic calcifications are seen. Lungs are well aerated bilaterally. Persistent density is noted in the left mid lung similar to that seen on recent CT examination again likely representing early infiltrate. No other focal abnormality is noted. Multiple thoracic compression deformities are noted. IMPRESSION: Patchy airspace opacity in the left mid lung similar to that seen on recent CT examination. No new focal abnormality is noted. Electronically Signed   By: Inez Catalina M.D.   On: 10/12/2022 23:29   CT CHEST WO CONTRAST  Result Date: 10/08/2022 CLINICAL DATA:  Chronic cough for several weeks EXAM: CT CHEST WITHOUT CONTRAST TECHNIQUE: Multidetector CT imaging of the chest was performed following the standard protocol without IV contrast. RADIATION DOSE REDUCTION: This exam was performed according to the departmental dose-optimization program which includes automated exposure control, adjustment of the mA and/or kV according to patient size and/or use of iterative reconstruction technique. COMPARISON:  Chest x-ray from the previous day as well as CT of the chest from 02/18/2022 FINDINGS: Cardiovascular: Atherosclerotic calcifications of the thoracic aorta are noted. Dilatation of the ascending aorta to 4.2 cm is again identified stable from the prior exam. Pulmonary artery appears within normal limits. Coronary calcifications are noted. No  cardiac enlargement is seen. Mediastinum/Nodes: Thoracic inlet shows calcified thyroid nodule on the right stable from the prior exam. No hilar or  mediastinal adenopathy is noted. The esophagus as visualized is within normal limits. Lungs/Pleura: Lungs are mildly hyperinflated. Bibasilar scarring is seen slightly worse on the left than the right. No confluent infiltrate is seen. Apical scarring is noted particularly on the left. Patchy airspace opacity is noted in the left upper lobe laterally best seen on image number 65 of series 4 likely related to early infiltrate. No sizable effusion is noted. Changes of bronchiectasis in the lower lobes is seen. Upper Abdomen: Cholecystectomy changes are noted. No other focal abnormality in the upper abdomen is seen. Musculoskeletal: Degenerative changes of the thoracic spine are noted. Bilateral breast implants are seen and stable. Multiple chronic compression deformities are noted within the thoracic spine stable in appearance from the prior exam. 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 Recommend annual imaging followup by CTA or MRA. This recommendation follows 2010 ACCF/AHA/AATS/ACR/ASA/SCA/SCAI/SIR/STS/SVM Guidelines for the Diagnosis and Management of Patients with Thoracic Aortic Disease. Circulation. 2010; 121: V409-W119. Aortic aneurysm NOS (ICD10-I71.9) Patchy airspace opacity in the left upper lobe laterally likely representing early infiltrate. Short-term follow-up in 3 months is recommended. Stable calcified right thyroid nodule. Not clinically significant; no follow-up imaging recommended (ref: J Am Coll Radiol. 2015 Feb;12(2): 143-50). Aortic Atherosclerosis (ICD10-I70.0) and Emphysema (ICD10-J43.9). Electronically Signed   By: Inez Catalina M.D.   On: 10/08/2022 20:12   DG Chest 2 View  Result Date: 10/07/2022 CLINICAL DATA:  Altered mental status EXAM: CHEST - 2 VIEW COMPARISON:  Chest x-ray 10/04/2022 FINDINGS: Surgical  changes and scarring in the left lung appear stable. Bilateral axillary surgical clips are again noted. There is no focal lung infiltrate, pleural effusion or pneumothorax. The cardiomediastinal silhouette is within normal limits. Midthoracic compression deformity appears unchanged. IMPRESSION: No active cardiopulmonary disease. Electronically Signed   By: Ronney Asters M.D.   On: 10/07/2022 15:15   CT HEAD WO CONTRAST (5MM)  Result Date: 10/07/2022 CLINICAL DATA:  Interval decline in function status post stroke. EXAM: CT HEAD WITHOUT CONTRAST TECHNIQUE: Contiguous axial images were obtained from the base of the skull through the vertex without intravenous contrast. RADIATION DOSE REDUCTION: This exam was performed according to the departmental dose-optimization program which includes automated exposure control, adjustment of the mA and/or kV according to patient size and/or use of iterative reconstruction technique. COMPARISON:  10/01/22 FINDINGS: Brain: No evidence of acute infarction, hemorrhage, hydrocephalus, extra-axial collection or mass lesion/mass effect. Remote infarct within the left cerebellar hemisphere is unchanged, image 9/3. There is mild diffuse low-attenuation within the subcortical and periventricular white matter compatible with chronic microvascular disease. Prominence of sulci and ventricles compatible with brain atrophy. Vascular: No hyperdense vessel or unexpected calcification. Skull: Normal. Negative for fracture or focal lesion. Sinuses/Orbits: No acute finding. Other: None. IMPRESSION: 1. No acute intracranial abnormalities. 2. Chronic microvascular disease and brain atrophy. 3. Remote left cerebellar hemisphere infarct. Electronically Signed   By: Kerby Moors M.D.   On: 10/07/2022 12:12   DG Chest 2 View  Result Date: 10/04/2022 CLINICAL DATA:  Cough EXAM: CHEST - 2 VIEW COMPARISON:  Chest x-ray 10/01/2022 FINDINGS: The cardiomediastinal silhouette is stable, the heart is  mildly enlarged. There is no focal lung infiltrate, pleural effusion or pneumothorax. Bilateral axillary surgical clips are present. No acute fractures are seen. IMPRESSION: No active cardiopulmonary disease. Electronically Signed   By: Ronney Asters M.D.   On: 10/04/2022 21:10   ECHOCARDIOGRAM COMPLETE  Result Date: 10/03/2022    ECHOCARDIOGRAM REPORT   Patient Name:  Barbara Krause Date of Exam: 10/03/2022 Medical Rec #:  [id number]     Height:       66.0 in Accession #:    [id number]    Weight:       134.0 lb Date of Birth:  1941-02-05     BSA:          1.687 m Patient Age:    56 years      BP:           158/111 mmHg Patient Gender: F             HR:           69 bpm. Exam Location:  Inpatient Procedure: 2D Echo, Color Doppler and Cardiac Doppler Indications:    Stroke i63.9  History:        Patient has no prior history of Echocardiogram examinations.                 Risk Factors:Hypertension and Dyslipidemia.  Sonographer:    Raquel Sarna Senior RDCS Referring Phys: [id number] Orma Flaming  Sonographer Comments: No visible apical window due to mastectomy and implant location. IMPRESSIONS  1. Left ventricular ejection fraction, by estimation, is 60 to 65%. The left ventricle has normal function. The left ventricle has no regional wall motion abnormalities. Left ventricular diastolic function could not be evaluated.  2. Right ventricular systolic function is normal. The right ventricular size is normal. There is mildly elevated pulmonary artery systolic pressure. The estimated right ventricular systolic pressure is 85.0 mmHg.  3. The mitral valve is normal in structure. No evidence of mitral valve regurgitation. No evidence of mitral stenosis.  4. Tricuspid valve regurgitation is mild to moderate.  5. The aortic valve is tricuspid. Aortic valve regurgitation is not visualized. No aortic stenosis is present.  6. Aortic dilatation noted. There is mild dilatation of the ascending aorta, measuring 44 mm.  7. The inferior  vena cava is normal in size with greater than 50% respiratory variability, suggesting right atrial pressure of 3 mmHg. Conclusion(s)/Recommendation(s): No intracardiac source of embolism detected on this transthoracic study. Consider a transesophageal echocardiogram to exclude cardiac source of embolism if clinically indicated. FINDINGS  Left Ventricle: Left ventricular ejection fraction, by estimation, is 60 to 65%. The left ventricle has normal function. The left ventricle has no regional wall motion abnormalities. The left ventricular internal cavity size was normal in size. There is  no left ventricular hypertrophy. Left ventricular diastolic function could not be evaluated. Right Ventricle: The right ventricular size is normal. No increase in right ventricular wall thickness. Right ventricular systolic function is normal. There is mildly elevated pulmonary artery systolic pressure. The tricuspid regurgitant velocity is 2.76  m/s, and with an assumed right atrial pressure of 8 mmHg, the estimated right ventricular systolic pressure is 27.7 mmHg. Left Atrium: Left atrial size was normal in size. Right Atrium: Right atrial size was normal in size. Pericardium: There is no evidence of pericardial effusion. Mitral Valve: The mitral valve is normal in structure. No evidence of mitral valve regurgitation. No evidence of mitral valve stenosis. Tricuspid Valve: The tricuspid valve is normal in structure. Tricuspid valve regurgitation is mild to moderate. No evidence of tricuspid stenosis. Aortic Valve: The aortic valve is tricuspid. Aortic valve regurgitation is not visualized. No aortic stenosis is present. Pulmonic Valve: The pulmonic valve was normal in structure. Pulmonic valve regurgitation is trivial. No evidence of pulmonic stenosis. Aorta: Aortic dilatation noted. There is mild dilatation of the  ascending aorta, measuring 44 mm. Venous: The inferior vena cava is normal in size with greater than 50% respiratory  variability, suggesting right atrial pressure of 3 mmHg. IAS/Shunts: No atrial level shunt detected by color flow Doppler.  LEFT VENTRICLE PLAX 2D LVIDd:         4.10 cm LVIDs:         2.60 cm LV PW:         0.90 cm LV IVS:        0.80 cm LVOT diam:     1.90 cm LV SV:         56 LV SV Index:   33 LVOT Area:     2.84 cm  LEFT ATRIUM         Index LA diam:    3.20 cm 1.90 cm/m  AORTIC VALVE LVOT Vmax:   94.00 cm/s LVOT Vmean:  69.700 cm/s LVOT VTI:    0.198 m  AORTA Ao Root diam: 2.95 cm Ao Asc diam:  4.35 cm TRICUSPID VALVE TR Peak grad:   30.5 mmHg TR Vmax:        276.00 cm/s  SHUNTS Systemic VTI:  0.20 m Systemic Diam: 1.90 cm Candee Furbish MD Electronically signed by Candee Furbish MD Signature Date/Time: 10/03/2022/9:26:39 AM    Final    CT ANGIO HEAD NECK W WO CM  Result Date: 10/02/2022 CLINICAL DATA:  Acute neurologic deficit EXAM: CT ANGIOGRAPHY HEAD AND NECK TECHNIQUE: Multidetector CT imaging of the head and neck was performed using the standard protocol during bolus administration of intravenous contrast. Multiplanar CT image reconstructions and MIPs were obtained to evaluate the vascular anatomy. Carotid stenosis measurements (when applicable) are obtained utilizing NASCET criteria, using the distal internal carotid diameter as the denominator. RADIATION DOSE REDUCTION: This exam was performed according to the departmental dose-optimization program which includes automated exposure control, adjustment of the mA and/or kV according to patient size and/or use of iterative reconstruction technique. CONTRAST:  1m OMNIPAQUE IOHEXOL 350 MG/ML SOLN COMPARISON:  None Available. FINDINGS: CT HEAD FINDINGS Brain: There is no mass, hemorrhage or extra-axial collection. There is generalized atrophy without lobar predilection. Old left cerebellar infarct There is hypoattenuation of the periventricular white matter, most commonly indicating chronic ischemic microangiopathy. Skull: The visualized skull base, calvarium  and extracranial soft tissues are normal. Sinuses/Orbits: No fluid levels or advanced mucosal thickening of the visualized paranasal sinuses. No mastoid or middle ear effusion. The orbits are normal. CTA NECK FINDINGS SKELETON: There is no bony spinal canal stenosis. No lytic or blastic lesion. OTHER NECK: Normal pharynx, larynx and major salivary glands. No cervical lymphadenopathy. Unremarkable thyroid gland. UPPER CHEST: Focal heterogeneous ground-glass opacity in the left upper lobe measuring 1.6 cm. AORTIC ARCH: There is calcific atherosclerosis of the aortic arch. There is no aneurysm, dissection or hemodynamically significant stenosis of the visualized portion of the aorta. Conventional 3 vessel aortic branching pattern. The visualized proximal subclavian arteries are widely patent. RIGHT CAROTID SYSTEM: Normal without aneurysm, dissection or stenosis. LEFT CAROTID SYSTEM: Normal without aneurysm, dissection or stenosis. VERTEBRAL ARTERIES: Left dominant configuration. Severe narrowing of the right vertebral artery origin. Multifocal moderate narrowing of the right V2 and V3 segments. There is severe stenosis of the left vertebral artery origin, but the remainder is normal. CTA HEAD FINDINGS POSTERIOR CIRCULATION: --Vertebral arteries: Normal V4 segments. --Inferior cerebellar arteries: Normal. --Basilar artery: Normal. --Superior cerebellar arteries: Normal. --Posterior cerebral arteries (PCA): Normal. ANTERIOR CIRCULATION: --Intracranial internal carotid arteries: Normal. --Anterior cerebral arteries (  ACA): Normal. Both A1 segments are present. Patent anterior communicating artery (a-comm). --Middle cerebral arteries (MCA): Normal. VENOUS SINUSES: As permitted by contrast timing, patent. ANATOMIC VARIANTS: None Review of the MIP images confirms the above findings. IMPRESSION: 1. No emergent large vessel occlusion or high-grade stenosis of the intracranial arteries. 2. 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 Multifocal moderate narrowing of the right V2 and V3 segments. 3. Focal heterogeneous ground-glass opacity in the left upper lobe measuring 1.6 cm. This could be infectious or neoplastic. Per Fleischner Society Guidelines, recommend a non-contrast Chest CT at 6 months to confirm persistence, then additional non-contrast Chest CTs every 2 years until 5 years. If nodule grows or develops solid component(s), consider resection. These guidelines do not apply to immunocompromised patients and patients with cancer. Follow up in patients with significant comorbidities as clinically warranted. For lung cancer screening, adhere to Lung-RADS guidelines. Reference: Radiology. 2017; 284(1):228-43. Aortic atherosclerosis (ICD10-I70.0). Electronically Signed   By: Ulyses Jarred M.D.   On: 10/02/2022 01:43   MR BRAIN WO CONTRAST  Result Date: 10/01/2022 CLINICAL DATA:  Slurred speech and difficulty walking EXAM: MRI HEAD WITHOUT CONTRAST TECHNIQUE: Multiplanar, multiecho pulse sequences of the brain and surrounding structures were obtained without intravenous contrast. COMPARISON:  None Available. FINDINGS: Brain: Punctate focus of acute ischemia in the right cerebellar hemisphere. No other acute infarct. No acute or chronic hemorrhage. There is multifocal hyperintense T2-weighted signal within the white matter. Generalized volume loss. Old left cerebellar infarct. The midline structures are normal. Vascular: Major flow voids are preserved. Skull and upper cervical spine: Normal calvarium and skull base. Visualized upper cervical spine and soft tissues are normal. Sinuses/Orbits:No paranasal sinus fluid levels or advanced mucosal thickening. No mastoid or middle ear effusion. Normal orbits. IMPRESSION: 1. Punctate focus of acute ischemia in the right cerebellar hemisphere. No hemorrhage or mass effect. 2. 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 Electronically Signed    By: Ulyses Jarred M.D.   On: 10/01/2022 21:28   CT Cervical Spine Wo Contrast  Result Date: 10/01/2022 CLINICAL DATA:  Trauma EXAM: CT CERVICAL SPINE WITHOUT CONTRAST TECHNIQUE: Multidetector CT imaging of the cervical spine was performed without intravenous contrast. Multiplanar CT image reconstructions were also generated. RADIATION DOSE REDUCTION: This exam was performed according to the departmental dose-optimization program which includes automated exposure control, adjustment of the mA and/or kV according to patient size and/or use of iterative reconstruction technique. COMPARISON:  CT C Spine 06/15/20 FINDINGS: Alignment: Normal. Skull base and vertebrae: No acute fracture. No primary bone lesion or focal pathologic process. Likely chronic superior endplate height loss at T3. Moderate degenerative changes at C4-C5. Soft tissues and spinal canal: No prevertebral fluid or swelling. No visible canal hematoma. Disc levels: Moderate spinal canal narrowing at C4-C5 secondary to a disc bulge. There are severe degenerative changes at C1-C2 on the right. Upper chest: Negative. Other: Calcified thyroid nodule on the right measuring up to 8 mm. There is a separate more exophytic nodule along the inferior margin of the right thyroid lobe measuring up to 1.5 cm. IMPRESSION: 1. No acute cervical spine fracture or traumatic malalignment. 2. Moderate spinal canal narrowing at C4-C5 secondary to a disc bulge. 3. Severe degenerative changes at C1-C2 on the right. 4. Right thyroid nodule measuring up to 1.5 cm. Recommend further evaluation with a dedicated thyroid ultrasound, if not previously performed. Electronically Signed   By: Marin Roberts M.D.   On: 10/01/2022 15:39  CT HEAD WO CONTRAST (5MM)  Result Date: 10/01/2022 CLINICAL DATA:  Head trauma. EXAM: CT HEAD WITHOUT CONTRAST TECHNIQUE: Contiguous axial images were obtained from the base of the skull through the vertex without intravenous contrast. RADIATION  DOSE REDUCTION: This exam was performed according to the departmental dose-optimization program which includes automated exposure control, adjustment of the mA and/or kV according to patient size and/or use of iterative reconstruction technique. COMPARISON:  CT head 06/15/2020.  MRI brain 06/20/2020. FINDINGS: Brain: No evidence of acute infarction, hemorrhage, hydrocephalus, extra-axial collection or mass lesion/mass effect. Again seen is moderate diffuse atrophy with compensatory dilatation of the ventricular system, unchanged. There is stable mild periventricular white matter hypodensity, likely chronic small vessel ischemic change. There is an old lacunar infarct in the right basal ganglia. There is also small chronic infarct in the left cerebellum, new from prior. Vascular: Atherosclerotic calcifications are present within the cavernous internal carotid arteries. Skull: Normal. Negative for fracture or focal lesion. Sinuses/Orbits: No acute finding. Other: None. IMPRESSION: 1. No acute intracranial process. 2. Stable moderate diffuse atrophy. 3. Stable mild chronic small vessel ischemic change. 4. Small chronic infarct in the left cerebellum, new from prior. Electronically Signed   By: Ronney Asters M.D.   On: 10/01/2022 15:33   DG Chest 2 View  Result Date: 10/01/2022 CLINICAL DATA:  Fatigue, falls, dysarthria EXAM: CHEST - 2 VIEW COMPARISON:  06/30/2020 FINDINGS: Atherosclerotic calcification of the aortic arch. Old healed right rib fractures. Bilateral breast implants. Heart size within normal limits for AP projection. The lungs appear clear. Bony demineralization noted. Multiple remote thoracic compression fractures. No blunting of the costophrenic angles. IMPRESSION: 1. No acute cardiopulmonary disease is radiographically apparent. 2. Multiple remote thoracic compression fractures. 3. Atherosclerotic calcification of the aortic arch. Electronically Signed   By: Van Clines M.D.   On: 10/01/2022  15:15    Labs:  Basic Metabolic Panel: Recent Labs  Lab 10/12/22 0515 10/13/22 0841  NA 143 141  K 4.4 4.1  CL 107 105  CO2 28 27  GLUCOSE 96 144*  BUN 27* 25*  CREATININE 0.88 0.87  CALCIUM 9.4 9.0    CBC: Recent Labs  Lab 10/13/22 0841 10/14/22 1313  WBC 17.4* 10.1  NEUTROABS 15.4* 7.9*  HGB 12.9 12.0  HCT 37.7 36.9  MCV 90.4 93.9  PLT 199 188    CBG: No results for input(s): "GLUCAP" in the last 168 hours.  Family history.  Mother with colon cancer father with CVA as well as heart disease Sister with ovarian cancer.  Maternal grandmother with stomach cancer paternal grandfather with colon cancer  Brief HPI:   Barbara Krause is a 81 y.o. right-handed female history of hypertension, breast cancer status post bilateral mastectomy 2009, memory deficit maintained on Aricept, Huntington's disease followed at [facility], osteoporosis, history of tobacco use.  Per chart review patient lives alone.  1 level home 3 steps to entry.  Has a home health aide 7 times a week for 4 hours a day.  She has assistance from her family.  Uses a platform rolling walker for mobility.  Family plans to increase PCA hours as needed.  Presented 10/01/2022 with slurred speech.  Noted recent fall when she struck her head no loss of consciousness and family noted increased somnolence over the past week.  CT/MRI showed punctate focus of acute ischemia in the right cerebellar hemisphere.  No hemorrhage or mass effect.  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  CT cervical spine negative for fracture or malalignment.  Noted right thyroid nodule measuring 1.5 cm recommend outpatient follow-up.  Admission chemistries unremarkable.  Echocardiogram with ejection fraction of 60 to 65% no wall motion abnormalities.  CT angiogram head and neck no emergent large vessel occlusion or high-grade stenosis.  Noted findings of groundglass opacity left upper lobe measuring 1.6 cm  recommend outpatient follow-up.  Neurology placed on aspirin and Plavix x 21 days then aspirin alone.  Lovenox added for DVT prophylaxis.  Tolerating a mechanical soft diet.  Patient is followed by [facility] for right wrist distal radius fracture.  Therapy evaluations completed due to patient decreased functional mobility was admitted for a comprehensive rehab program.   Hospital Course: Breeona Waid was admitted to rehab 10/04/2022 for inpatient therapies to consist of PT, ST and OT at least three hours five days a week. Past admission physiatrist, therapy team and rehab RN have worked together to provide customized collaborative inpatient rehab.  Pertaining to patient's right cerebellar hemispheric punctate focus of acute ischemia maintained on aspirin and Plavix x 3 weeks then aspirin alone.  Patient would follow-up neurology services.  Latest cranial CT scan 10/07/2022 showed no acute abnormalities.  Maintain on Lovenox for DVT prophylaxis no bleeding episodes.  Mood stabilization with the use of Aricept as well as Lexapro.  Blood pressure controlled on Cozaar will need outpatient follow-up close monitoring for bouts of bradycardia.  Lipitor for hyperlipidemia.  Patient with history of Huntington's disease followed at [facility] maintained on Deutetrabenazine adjusted at the recommendations of neurology at [facility] who were contacted by family.  Tobacco use NicoDerm patch as directed receiving counts regards to cessation of nicotine products.  Incidental findings of right thyroid nodule as well as 1.6 cm left upper lobe groundglass opacity follow-up outpatient was recommended.  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  Recommendations at that time were for annual imaging followed by CTA or MRA.  Patient was tolerating a mechanical soft diet with thin liquids.  History of right wrist distal radius fracture followed by [facility]  nonweightbearing on the right wrist long-arm cast.  Hospital course pneumonia quinolone started transition to [place] with chest x-ray 10/12/2022 showing no new focal abnormality noted.  Patient's oxygen saturations maintained.  Palliative care did follow patient for goals of care..   Blood pressures were monitored on TID basis and soft and monitored     Rehab course: During patient's stay in rehab weekly team conferences were held to monitor patient's progress, set goals and discuss barriers to discharge. At admission, patient required minimal assist 100 feet rolling walker minimal assist sit to stand  Physical exam blood pressure 130/79 pulse rate 48 oxygen saturation 96% respirations 18 temperature 98.1 Constitutional.  No acute distress HEENT Head.  Normocephalic and atraumatic Eyes.  Pupils round and reactive to light no discharge without nystagmus Neck.  Supple nontender no JVD without thyromegaly Cardiac regular rate and rhythm without any extra sounds or murmur heard Abdomen.  Soft nontender positive bowel sounds without rebound Respiratory effort normal no respiratory distress without wheeze Extremities no clubbing cyanosis or edema Neurologic.  Speech mildly dysarthric but intelligible.  Provides name and age follows simple commands.  CN II through XII intact Strength 5/5 left upper left lower extremity Strength 5/5 left lower extremity Sensation intact all 4.  Right arm and forearm in cast  He/She  has had improvement in activity  tolerance, balance, postural control as well as ability to compensate for deficits. He/She has had improvement in functional use RUE/LUE  and RLE/LLE as well as improvement in awareness.  Sessions with emphasis on functional mobility and transfers.  Side-lying to sitting edge of bed with supervision.  Stood from edge of bed with platform rolling walker moderate assist due to posterior lean ambulates in and out of bathroom with rolling walker  contact-guard to light minimal assist.  Stood from wheelchair with right platform rolling walker contact-guard ambulate 80 feet.  Rest breaks as needed.  Full family teaching completed plan discharge to home       Disposition: Discharge to home    Diet: Mechanical soft with thin liquids  Special Instructions: No driving smoking or alcohol  Nonweightbearing right wrist with follow-up [facility]  Follow-up outpatient for incidental findings of right thyroid nodule as well as 1.6 cm left upper lobe groundglass opacity  Medications at discharge. 1.  Tylenol as needed 2.  Aspirin 81 mg p.o. daily 3.  Lipitor 40 mg p.o. daily 4.  Plavix 75 mg daily x 6 days and stop 5.Deutetrabenazine 1 tablet daily and 2 tablets nightly 6.  Colace 100 mg p.o. daily 7.  Aricept 5 mg p.o. daily 8.  Lexapro 20 mg p.o. daily 9.  Cozaar 50 mg p.o. daily 10.  Multivitamin daily 11.  NicoDerm patch taper as directed 12.  Scopolamine patch change every 72 hours  30-35 minutes were spent completing discharge summary and discharge planning  Discharge Instructions     Ambulatory referral to Neurology   Complete by: As directed    An appointment is requested in approximately: 4 weeks right cerebellar infarction   Ambulatory referral to Physical Medicine Rehab   Complete by: As directed    Moderate complexity follow-up 1 to 2 weeks right cerebellar infarction        Follow-up Information     Raulkar, Clide Deutscher, MD Follow up.   Specialty: Physical Medicine and Rehabilitation Why: Office to call for appointment Contact information: [address] [place] [address] [phone]                 Signed: Cathlyn Parsons 10/15/2022, 5:37 AM

## 2022-10-15 NOTE — Progress Notes (Signed)
Inpatient Rehabilitation Care Coordinator Discharge Note   Patient Details  Name: Barbara Krause MRN: [id number] Date of Birth: May 26, 1941   Discharge location: Home with [facility] and 2 daughters  Length of Stay: 14 Days  Discharge activity level: sup/cga  Home/community participation: daughters  Patient response FQ:[id number] Literacy - How often do you need to have someone help you when you read instructions, pamphlets, or other written material from your doctor or pharmacy?: Sometimes  Patient response [id number] Isolation - How often do you feel lonely or isolated from those around you?: Never  Services provided included: SW, Neuropsych, Pharmacy, TR, RN, CM, SLP, OT, RD, PT, MD  Financial Services:  Financial Services Utilized: Medicare    Choices offered to/list presented to: patient  Follow-up services arranged:  [place]: [phone]         Patient response to transportation need: Is the patient able to respond to transportation needs?: Yes In the past 12 months, has lack of transportation kept you from medical appointments or from getting medications?: No In the past 12 months, has lack of transportation kept you from meetings, work, or from getting things needed for daily living?: No    Comments (or additional information):  Patient/Family verbalized understanding of follow-up arrangements:  Yes  Individual responsible for coordination of the follow-up plan: Ivar Drape (daughter) [phone]  Confirmed correct DME delivered: Dyanne Iha 10/15/2022    Dyanne Iha

## 2022-10-15 NOTE — Progress Notes (Signed)
Speech Language Pathology Note  Patient Details  Name: Barbara Krause MRN: [id number] Date of Birth: 30-Aug-1941 Today's Date: 10/15/2022  SLP reduced ST services from x5 to x1-3 a week, due to pt reporting speech now at baseline. Pt was agreeable to change and requested to focus on OT/PT services during CIR stay.    Casey Maxfield  [facility] 10/15/2022, 10:54 AM

## 2022-10-15 NOTE — Progress Notes (Signed)
Speech Language Pathology Daily Session Note  Patient Details  Name: Danely Bayliss MRN: [id number] Date of Birth: Dec 31, 1940  Today's Date: 10/15/2022 SLP Individual Time: 5397-6734 SLP Individual Time Calculation (min): 59 min  Short Term Goals: Week 2: SLP Short Term Goal 1 (Week 2): STG's = LTG's due to [place] services focused on swallow and speech skills. Pt supports speech intelligibly has greatly improved and reports now back at baseline level. Pt is requesting to still work on vocal intensity and speech intelligibly strategies but from a chronic verse acute framework. SLP facilitated speech intelligibility strategies in picture description task, pt demonstrated 80-85% intelligibility with min-supervision A verbal cues. Pt recalled swallow strategies of cough post swallow. Pt demonstrated consecutive swallows of thin liquids via straw with no overt s/s aspiration. SLP educated pt to consume small verse consecutive sips due to h/o of aspiration. Pt consumed small sips with immediate cough, however vocal quality appeared clear. SLP facilitated EMST (to strength swallow, cough and vocal quality) set at 15cm H20, pt completed x5 sets of X5 throughout the session and self reported 5/10 effort. Pt required mod A fade to min A verbal cues for breath support to complete exercises. SLP reduced pt to x1-3 a week for ST services and pt was agreeable. Pt was left in room with call bell within reach and alarm set. SLP recommends to continue skilled services.     Pain Pain Assessment Pain Score: 0-No pain  Therapy/Group: Individual Therapy  Floella Ensz  [facility] 10/15/2022, 10:46 AM

## 2022-10-15 NOTE — Progress Notes (Signed)
Occupational Therapy Discharge Summary  Patient Details  Name: Barbara Krause MRN: [id number] Date of Birth: 07/12/1941  Date of Discharge from [facility] service:October 17, 2022  Patient has met 8 of 8 long term goals due to improved activity tolerance, improved balance, ability to compensate for deficits, and improved coordination.  Patient to discharge at overall  CGA to min A  level.  Patient's daughter (who is an OT herself) is independent to provide the necessary physical and cognitive assist at discharge and has been present and participatory during sessions regarding home recommendations and d/c planning decisions.  Reasons goals not met: NA  Recommendation:  Patient will benefit from ongoing skilled OT services in home health setting to continue to advance functional skills in the area of BADL.  Equipment: 3 in 1 BSC  Reasons for discharge: treatment goals met  Patient/family agrees with progress made and goals achieved: Yes  OT Discharge Precautions/Restrictions  Precautions Precautions: Fall Restrictions Weight Bearing Restrictions: Yes RUE Weight Bearing: Non weight bearing ADL ADL Eating: Set up Where Assessed-Eating: Chair Grooming: Setup Where Assessed-Grooming: Sitting at sink Upper Body Bathing: Minimal assistance Where Assessed-Upper Body Bathing: Shower Lower Body Bathing: Supervision/safety Where Assessed-Lower Body Bathing: Shower Upper Body Dressing: Supervision/safety Where Assessed-Upper Body Dressing: Wheelchair Lower Body Dressing: Contact guard Where Assessed-Lower Body Dressing: Sitting at sink, Standing at sink Toileting: Contact guard Where Assessed-Toileting: Bedside Commode Toilet Transfer: Therapist, music Method: Counselling psychologist: Radiographer, therapeutic: Not assessed Tub/Shower Transfer Method: Unable to assess Social research officer, government: Curator Method: Ambulating,  Stand pivot Youth worker: Grab bars, Radio broadcast assistant Vision Baseline Vision/History: 1 Wears glasses Patient Visual Report: No change from baseline Vision Assessment?: No apparent visual deficits Perception  Perception: Within Functional Limits Praxis Praxis: Intact Cognition Cognition Overall Cognitive Status: History of cognitive impairments - at baseline Arousal/Alertness: Awake/alert Orientation Level: Person;Place;Situation Person: Oriented Place: Oriented Situation: Oriented Memory: Impaired Awareness: Impaired Problem Solving: Impaired Safety/Judgment: Impaired Comments: decreased safety awareness and understanding of DME Brief Interview for Mental Status (BIMS) Repetition of Three Words (First Attempt): 3 Temporal Orientation: Year: Correct Temporal Orientation: Month: Accurate within 5 days Temporal Orientation: Day: Correct Recall: "Sock": Yes, no cue required Recall: "Blue": Yes, no cue required Recall: "Bed": Yes, no cue required BIMS Summary Score: 15 Sensation Sensation Light Touch: Appears Intact Hot/Cold: Appears Intact Proprioception: Appears Intact Stereognosis: Not tested Coordination Gross Motor Movements are Fluid and Coordinated: No Fine Motor Movements are Fluid and Coordinated: No Coordination and Movement Description: grossly uncoordinated due to hemiparesis, generalized weakness/deconditioning, decreased balance strategies, and impaired motor planning/sequencing Finger Nose Finger Test: [facility] but slow on LUE, unable to flex R elbow due to cast Motor  Motor Motor: Hemiplegia;Other (comment) Motor - Skilled Clinical Observations: grossly uncoordinated due to hemiparesis, generalized weakness/deconditioning, decreased balance strategies, and impaired motor planning/sequencing. Hx of Huntington's disease Mobility  Bed Mobility Bed Mobility: Rolling Right;Rolling Left;Sit to Supine;Supine to Sit Rolling Right: Independent Rolling  Left: Independent Supine to Sit: Independent Sit to Supine: Independent Transfers Sit to Stand: Contact Guard/Touching assist Stand to Sit: Supervision/Verbal cueing  Trunk/Postural Assessment  Cervical Assessment Cervical Assessment: Exceptions to [facility] (forward head) Thoracic Assessment Thoracic Assessment: Exceptions to [facility] (kyphosis) Lumbar Assessment Lumbar Assessment: Exceptions to [facility] (posterior pelvic tilt) Postural Control Postural Control: Deficits on evaluation Trunk Control: posterior lean, delayed righting reactions  Balance Balance Balance Assessed: Yes Static Sitting Balance Static Sitting - Balance Support: Feet supported;Bilateral upper extremity supported  Static Sitting - Level of Assistance: 5: Stand by assistance (supervision due to occasional anterior lean) Dynamic Sitting Balance Dynamic Sitting - Balance Support: Feet supported;No upper extremity supported Dynamic Sitting - Level of Assistance: 5: Stand by assistance (supervision due to occasional anterior lean) Static Standing Balance Static Standing - Balance Support: Bilateral upper extremity supported;During functional activity (R PFRW) Static Standing - Level of Assistance: 5: Stand by assistance (supervision) Dynamic Standing Balance Dynamic Standing - Balance Support: Bilateral upper extremity supported;During functional activity (R PFRW) Dynamic Standing - Level of Assistance: 5: Stand by assistance (supervision) Extremity/Trunk Assessment RUE Assessment RUE Assessment: Exceptions to [facility] Active Range of Motion (AROM) Comments: Shoulder flexion limited to 120 degrees, elbow/wrist casted therefore restricted ROM, digits WFL General Strength Comments: Unable to assess 2/2 NWB precaution LUE Assessment LUE Assessment: Exceptions to [facility] Active Range of Motion (AROM) Comments: WFL General Strength Comments: 4-/5 grossly   Athalie Newhard E Kiyoshi Schaab, MS, OTR/L  10/18/2022, 4:12 PM

## 2022-10-15 NOTE — Progress Notes (Signed)
PROGRESS NOTE   Subjective/Complaints: No new complaints this morning Discussed that antibiotics will stop Sunday prior to her d/c, her cough has improved, her speech and drooling are much improved!  ROS: denies pain, +cough, +hemauria as per nursing, +shuffling gait, +chorea more present at night drooling improved  Objective:   No results found. Recent Labs    10/13/22 0841 10/14/22 1313  WBC 17.4* 10.1  HGB 12.9 12.0  HCT 37.7 36.9  PLT 199 188   Recent Labs    10/13/22 0841  NA 141  K 4.1  CL 105  CO2 27  GLUCOSE 144*  BUN 25*  CREATININE 0.87  CALCIUM 9.0    Intake/Output Summary (Last 24 hours) at 10/15/2022 1123 Last data filed at 10/14/2022 1839 Gross per 24 hour  Intake 460 ml  Output --  Net 460 ml      Pressure Injury 10/08/22 Hand Posterior;Right Stage 2 -  Partial thickness loss of dermis presenting as a shallow open injury with a red, pink wound bed without slough. device related; open wound found under cast on right hand (Active)  10/08/22 1640  Location: Hand  Location Orientation: Posterior;Right  Staging: Stage 2 -  Partial thickness loss of dermis presenting as a shallow open injury with a red, pink wound bed without slough.  Wound Description (Comments): device related; open wound found under cast on right hand  Present on Admission: Yes    Physical Exam: Vital Signs Blood pressure 130/79, pulse (!) 48, temperature 98.4 F (36.9 C), temperature source Oral, resp. rate 16, height '5\' 6"'$  (1.676 m), weight 52.7 kg, SpO2 96 %.   General: No acute distress, BMI 20.1 Mood and affect are appropriate HEENT: drawn facies, +chorea Heart: Bradycardia Lungs: Clear to auscultation, breathing unlabored, no rales or wheezes Abdomen: Positive bowel sounds, soft nontender to palpation, nondistended Extremities: No clubbing, cyanosis, or edema Skin:  no evidence of rash, stage 2 to posterior of  right hand Neurologic: Cranial nerves II through XII intact, motor strength is 4/5 in left deltoid, bicep, tricep, grip, hip flexor, knee extensors, ankle dorsiflexor and plantar flexor, increased chorea  Cerebellar exam normal finger to nose to finger as well as heel to shin in LEFT upper and lower extremities RUE NT due to long arm cast, has some skin breakdown due the  Musculoskeletal: Full range of motion in all 4 extremities. No joint swelling, RIght elbow in long arm cast  ModA for sit to stand Ambulating with ModA with PFRW, shuffling gait   Assessment/Plan: 1. Functional deficits which require 3+ hours per day of interdisciplinary therapy in a comprehensive inpatient rehab setting. Physiatrist is providing close team supervision and 24 hour management of active medical problems listed below. Physiatrist and rehab team continue to assess barriers to discharge/monitor patient progress toward functional and medical goals  Care Tool:  Bathing    Body parts bathed by patient: Right arm, Chest, Abdomen, Front perineal area, Right upper leg, Left upper leg, Face, Right lower leg   Body parts bathed by helper: Left arm, Buttocks     Bathing assist Assist Level: Moderate Assistance - Patient 50 - 74%     Upper  Body Dressing/Undressing Upper body dressing   What is the patient wearing?: Pull over shirt    Upper body assist Assist Level: Supervision/Verbal cueing    Lower Body Dressing/Undressing Lower body dressing      What is the patient wearing?: Underwear/pull up, Pants     Lower body assist Assist for lower body dressing: Moderate Assistance - Patient 50 - 74%     Toileting Toileting    Toileting assist Assist for toileting: Minimal Assistance - Patient > 75%     Transfers Chair/bed transfer  Transfers assist     Chair/bed transfer assist level: Contact Guard/Touching assist     Locomotion Ambulation   Ambulation assist      Assist level:  Supervision/Verbal cueing Assistive device: Walker-platform Max distance: 141f   Walk 10 feet activity   Assist     Assist level: Supervision/Verbal cueing Assistive device: Walker-platform   Walk 50 feet activity   Assist Walk 50 feet with 2 turns activity did not occur: Safety/medical concerns (fatigue and generalized weakness)  Assist level: Supervision/Verbal cueing Assistive device: Walker-platform    Walk 150 feet activity   Assist Walk 150 feet activity did not occur: Safety/medical concerns (fatigue and generalized weakness)  Assist level: Supervision/Verbal cueing Assistive device: Walker-platform    Walk 10 feet on uneven surface  activity   Assist     Assist level: Moderate Assistance - Patient - 50 - 74% Assistive device: Walker-platform   Wheelchair     Assist Is the patient using a wheelchair?: Yes Type of Wheelchair: Manual    Wheelchair assist level: Dependent - Patient 0%      Wheelchair 50 feet with 2 turns activity    Assist        Assist Level: Dependent - Patient 0%   Wheelchair 150 feet activity     Assist      Assist Level: Dependent - Patient 0%   Blood pressure 130/79, pulse (!) 48, temperature 98.4 F (36.9 C), temperature source Oral, resp. rate 16, height '5\' 6"'$  (1.676 m), weight 52.7 kg, SpO2 96 %.  Medical Problem List and Plan: 1. Functional deficits secondary to right cerebellar hemispheric punctate focus of acute ischemia             -patient may  shower             -ELOS/Goals: 7-10 days PT/OT Sup, SLP Mod I              1,000U daily vitamin D started  CT Head ordered for AMS and was stable. Discussed with daughter  Labs reviewed  Plan for d/c home with daughter on Monday. Discussed antibiotics will stop Sunday  2.  Impaired mobility: continue Lovenox since ambulating 90 feet, Mod A sit to stand             -antiplatelet therapy: Aspirin 81 mg daily and Plavix 75 mg daily x 3 weeks then aspirin  alone 3. Pain Management: Tylenol as needed 4. Depression: continue Aricept 5 mg daily, Lexapro 20 mg daily             -antipsychotic agents: N/A 5. Neuropsych/cognition: This patient is capable of making decisions on her own behalf. Oriented with cues to date , remembered B day yesterday  6. Skin/Wound Care: Routine skin checks 7. Fluids/Electrolytes/Nutrition: Routine in and outs with follow-up chemistries 8.  Hypertension.  Cozaar 50 mg daily.  Monitor with increased mobility. Has been well controlled. Klor increased to 481m daily. Elevated  but will hold off on adding medication given bradycardia Vitals:   10/14/22 2014 10/15/22 0514  BP: (!) 142/90 130/79  Pulse: (!) 59 (!) 48  Resp: 16 16  Temp: 98.1 F (36.7 C) 98.4 F (36.9 C)  SpO2: 97% 96%    9.  Hyperlipidemia.  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  Continue Deutetrabenazine. Decreased dose to 1 tab per day as per neurologist recommendations given possible side effects of parkisnonian symptoms. Discussed with daughter, she will bring her medication in from home 7.  Tobacco use.  NicoDerm patch.  Provide counseling 12.  Incidental findings of right thyroid nodule as well as 1.6 cm left upper lobe groundglass opacity.  Follow-up outpatient. Discussed follow-up in hospital with daughter given her concerns for infectious process, discussion is ongoing 44. 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 Cough: CT reviewed and shows early infiltrate, abx started from Wednesday to Sunday  16. Right wrist distal radius fracture. Followed by [facility]. Non weight-bearing on the right wrist , long arm cast  17 Thrush. Diflucan ordered 18. Suboptimal potassium: klor increased to 32mq daily, repeat today 19. AMS: WBC obtained and stable 20. Rising BUN: NS bolus ordered. Repeat today.  21. Positive UA: discussed starting antibiotic,UCx neg hold off on abx    Had hematuria w/u in 2022 , + nephrolithiasis, no flank pan c/o but may have become symptomatic with anticoag, monitor Hgb  22. Drooling: continue scopolamine patch, helping.  23. Pneumonia: Quinolone started IV. Midline placed. Has allergy to PCN. Discussed with patient will stop Sunday. WBC reviewed and normalized.   LOS: 11 days A FACE TO FACE EVALUATION WAS PERFORMED  Barbara Krause P Asuncion Shibata 10/15/2022, 11:23 AM

## 2022-10-15 NOTE — Progress Notes (Signed)
Occupational Therapy Session Note  Patient Details  Name: Barbara Krause MRN: [id number] Date of Birth: Feb 05, 1941  Today's Date: 10/15/2022 OT Individual Time: 8182-9937 & 1450-1530 OT Individual Time Calculation (min): 40 min & 40 min   Short Term Goals: Week 2:  OT Short Term Goal 1 (Week 2): Continue progressing towards LTG as they remain appropriate  Skilled Therapeutic Interventions/Progress Updates:  Session 1 Skilled OT intervention completed with focus on functional transfers, self-care needs. Pt received upright in bed, agreeable to session. No pain reported during session.  Pt requested to shower, however due to midline placement in LUE and time available for session, therapist recommended sponge bathing with pt agreeable. Transitioned to EOB with supervision, CGA sit > stand with L HHA and CGA few step transfer to w/c with L HHA.   Seated at sink, pt verbalized that she noticed having more involuntary facial expressions which she initially stated was a technique that SLP taught her for pronouncing her words. After discussing therapist also noticed increased faces made however discussed that these are also known to be related to Huntington's and if they made changes to her meds she could be noticing more due to the med adjustment. Nurse notified of pt report of change for visual assessment.  Pt doffed shirt with supervision, set up A UB bathing and donned shirt with supervision. Oral care with regular tooth brush with supervision- good recall of modified strategy for use of one hand due to cast and inability to bend elbow.   Pt requested to transfer to recliner, with overall CGA needed and HHA for sit > stand and stand pivot. Pt remained upright in recliner with feet elevated, fresh blanket, belt alarm on/activated, and with all needs in reach at end of session.  Session 2 Skilled OT intervention completed with focus on dynamic weight shifting, agility with stepping patterns  needed for functional transfers/ambulation. Pt received upright in bed, agreeable to session. No pain reported.  Bed mobility to EOB with supervision. Donned shoes with min A, then CGA sit > stand and stand pivot to w/c with HHA. Transported dependently in w/c <> for energy conservation.  Pt participated in the following tasks to address balance, weight shifting and agility with BLE foot placement: -tapping numbered dots with called out L/R leg, with pt able to weight shift with L HHA and CGA. Min A needed on 2 occasions when pt tapped dot with opposite leg, with cues also provided for completing a natural balance strategy such as side stepping to be able to weight shift/tap -corn hole toss, using LUE, with pt able to maintain stance with CGA and no LOB during task  Back in room, pt completed ambulatory transfer from w/c > recliner with L HHA and CGA. Pt remained upright in recliner, with belt alarm on/activated, and with all needs in reach at end of session.  Therapy Documentation Precautions:  Precautions Precautions: Fall Restrictions Weight Bearing Restrictions: Yes RUE Weight Bearing: Non weight bearing Other Position/Activity Restrictions: RT Wrist fracture ~3 weeks ago   Therapy/Group: Individual Therapy  Blase Mess, MS, OTR/L  10/15/2022, 3:40 PM

## 2022-10-16 DIAGNOSIS — E876 Hypokalemia: Secondary | ICD-10-CM

## 2022-10-16 DIAGNOSIS — J189 Pneumonia, unspecified organism: Secondary | ICD-10-CM

## 2022-10-16 DIAGNOSIS — R7989 Other specified abnormal findings of blood chemistry: Secondary | ICD-10-CM

## 2022-10-16 LAB — BASIC METABOLIC PANEL WITH GFR
Anion gap: 9 (ref 5–15)
BUN: 20 mg/dL (ref 8–23)
CO2: 26 mmol/L (ref 22–32)
Calcium: 9.3 mg/dL (ref 8.9–10.3)
Chloride: 108 mmol/L (ref 98–111)
Creatinine, Ser: 0.85 mg/dL (ref 0.44–1.00)
GFR, Estimated: >60 mL/min
Glucose, Bld: 87 mg/dL (ref 70–99)
Potassium: 3.9 mmol/L (ref 3.5–5.1)
Sodium: 143 mmol/L (ref 135–145)

## 2022-10-16 MED ORDER — SODIUM CHLORIDE 0.9 % IV BOLUS
500.0000 mL | Freq: Once | INTRAVENOUS | Status: AC
  Administered 2022-10-16: 500 mL via INTRAVENOUS

## 2022-10-16 NOTE — Progress Notes (Signed)
   Palliative Medicine Inpatient Follow Up Note  Chart review completed.   Patient is improving, antibiotics to complete tomorrow for LUL early infiltrate.   Plan per rehab medicine for discharge on 12/18.  No Charge ______________________________________________________________________________________ [facility] Team Team Cell Phone: [phone] Please utilize secure chat with additional questions, if there is no response within 30 minutes please call the above phone number  Palliative Medicine Team providers are available by phone from 7am to 7pm daily and can be reached through the team cell phone.  Should this patient require assistance outside of these hours, please call the patient's attending physician.

## 2022-10-16 NOTE — Progress Notes (Signed)
PROGRESS NOTE   Subjective/Complaints: No new complaints or concerns this afternoon. Abx changed to oral, Midline to be removed.   ROS: denies CP, SOB, +cough, +hemauria as per nursing, +shuffling gait, +chorea more present at night drooling improved  Objective:   No results found. Recent Labs    10/14/22 1313  WBC 10.1  HGB 12.0  HCT 36.9  PLT 188    Recent Labs    10/16/22 1023  NA 143  K 3.9  CL 108  CO2 26  GLUCOSE 87  BUN 20  CREATININE 0.85  CALCIUM 9.3     Intake/Output Summary (Last 24 hours) at 10/16/2022 2029 Last data filed at 10/16/2022 1844 Gross per 24 hour  Intake 832.33 ml  Output --  Net 832.33 ml      Pressure Injury 10/08/22 Hand Posterior;Right Stage 2 -  Partial thickness loss of dermis presenting as a shallow open injury with a red, pink wound bed without slough. device related; open wound found under cast on right hand (Active)  10/08/22 1640  Location: Hand  Location Orientation: Posterior;Right  Staging: Stage 2 -  Partial thickness loss of dermis presenting as a shallow open injury with a red, pink wound bed without slough.  Wound Description (Comments): device related; open wound found under cast on right hand  Present on Admission: Yes    Physical Exam: Vital Signs Blood pressure 125/74, pulse 65, temperature 99.4 F (37.4 C), resp. rate 16, height '5\' 6"'$  (1.676 m), weight 53.5 kg, SpO2 95 %.   General: No acute distress, BMI 20.1 Mood and affect are appropriate HEENT: drawn facies, +chorea Heart: RRR Lungs: Clear to auscultation, breathing unlabored, no rales or wheezes Abdomen: Positive bowel sounds, soft nontender to palpation, nondistended Extremities: No clubbing, cyanosis, or edema Skin:  no evidence of rash, stage 2 to posterior of right hand Neurologic: Cranial nerves II through XII intact, motor strength is 4/5 in left deltoid, bicep, tricep, grip, hip flexor,  knee extensors, ankle dorsiflexor and plantar flexor, + chorea  Cerebellar exam normal finger to nose to finger as well as heel to shin in LEFT upper and lower extremities RUE NT due to long arm cast, has some skin breakdown due the  Musculoskeletal: Full range of motion in all 4 extremities. No joint swelling, RIght elbow in long arm cast  ModA for sit to stand Ambulating with ModA with PFRW, shuffling gait   Assessment/Plan: 1. Functional deficits which require 3+ hours per day of interdisciplinary therapy in a comprehensive inpatient rehab setting. Physiatrist is providing close team supervision and 24 hour management of active medical problems listed below. Physiatrist and rehab team continue to assess barriers to discharge/monitor patient progress toward functional and medical goals  Care Tool:  Bathing    Body parts bathed by patient: Right arm, Chest, Abdomen, Front perineal area, Right upper leg, Left upper leg, Face, Right lower leg   Body parts bathed by helper: Left arm, Buttocks     Bathing assist Assist Level: Moderate Assistance - Patient 50 - 74%     Upper Body Dressing/Undressing Upper body dressing   What is the patient wearing?: Pull over shirt  Upper body assist Assist Level: Supervision/Verbal cueing    Lower Body Dressing/Undressing Lower body dressing      What is the patient wearing?: Underwear/pull up, Pants     Lower body assist Assist for lower body dressing: Moderate Assistance - Patient 50 - 74%     Toileting Toileting    Toileting assist Assist for toileting: Minimal Assistance - Patient > 75%     Transfers Chair/bed transfer  Transfers assist     Chair/bed transfer assist level: Supervision/Verbal cueing     Locomotion Ambulation   Ambulation assist      Assist level: Supervision/Verbal cueing Assistive device: Walker-platform Max distance: 16f   Walk 10 feet activity   Assist     Assist level:  Supervision/Verbal cueing Assistive device: Walker-platform   Walk 50 feet activity   Assist Walk 50 feet with 2 turns activity did not occur: Safety/medical concerns (fatigue and generalized weakness)  Assist level: Supervision/Verbal cueing Assistive device: Walker-platform    Walk 150 feet activity   Assist Walk 150 feet activity did not occur: Safety/medical concerns (fatigue and generalized weakness)  Assist level: Supervision/Verbal cueing Assistive device: Walker-platform    Walk 10 feet on uneven surface  activity   Assist     Assist level: Contact Guard/Touching assist Assistive device: Walker-platform   Wheelchair     Assist Is the patient using a wheelchair?: No Type of Wheelchair: Manual Wheelchair activity did not occur: N/A  Wheelchair assist level: Dependent - Patient 0%      Wheelchair 50 feet with 2 turns activity    Assist    Wheelchair 50 feet with 2 turns activity did not occur: N/A   Assist Level: Dependent - Patient 0%   Wheelchair 150 feet activity     Assist  Wheelchair 150 feet activity did not occur: N/A   Assist Level: Dependent - Patient 0%   Blood pressure 125/74, pulse 65, temperature 99.4 F (37.4 C), resp. rate 16, height '5\' 6"'$  (1.676 m), weight 53.5 kg, SpO2 95 %.  Medical Problem List and Plan: 1. Functional deficits secondary to right cerebellar hemispheric punctate focus of acute ischemia             -patient may  shower             -ELOS/Goals: 7-10 days PT/OT Sup, SLP Mod I              1,000U daily vitamin D started  CT Head ordered for AMS and was stable. Discussed with daughter  Labs reviewed  Plan for d/c home with daughter on Monday. Discussed antibiotics will stop Sunday  2.  Impaired mobility: continue Lovenox since ambulating 90 feet, Mod A sit to stand             -antiplatelet therapy: Aspirin 81 mg daily and Plavix 75 mg daily x 3 weeks then aspirin alone 3. Pain Management: Tylenol as  needed 4. Depression: continue Aricept 5 mg daily, Lexapro 20 mg daily             -antipsychotic agents: N/A 5. Neuropsych/cognition: This patient is capable of making decisions on her own behalf. Oriented with cues to date , remembered B day yesterday  6. Skin/Wound Care: Routine skin checks 7. Fluids/Electrolytes/Nutrition: Routine in and outs with follow-up chemistries 8.  Hypertension.  Cozaar 50 mg daily.  Monitor with increased mobility. Has been well controlled. Klor increased to 452m daily. Elevated but will hold off on adding medication given bradycardia  Vitals:   10/16/22 1355 10/16/22 1927  BP: 136/70 125/74  Pulse: 66 65  Resp: 16 16  Temp: 98.4 F (36.9 C) 99.4 F (37.4 C)  SpO2: 96% 95%   Well controlled, continue to monitor 9.  Hyperlipidemia.  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  Continue Deutetrabenazine. Decreased dose to 3 tabs per day as per neurologist recommendations given possible side effects of parkisnonian symptoms. Discussed with daughter, she will bring her medication in from home 56.  Tobacco use.  NicoDerm patch.  Provide counseling 12.  Incidental findings of right thyroid nodule as well as 1.6 cm left upper lobe groundglass opacity.  Follow-up outpatient. Discussed follow-up in hospital with daughter given her concerns for infectious process, discussion is ongoing 90. 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 Cough: CT reviewed and shows early infiltrate, abx started from Wednesday to Sunday  16. Right wrist distal radius fracture. Followed by [facility]. Non weight-bearing on the right wrist , long arm cast  17 Thrush. Diflucan ordered 18. Suboptimal potassium: klor increased to 47mq daily, repeat today  -12/16 K+ stable 3.9 19. AMS: WBC obtained and stable 20. Rising BUN: NS bolus ordered. Repeat today.   -12/16 BUN improving, will give one additional 500cc bolus  before DC midline 21. Positive UA: discussed starting antibiotic,UCx neg hold off on abx   Had hematuria w/u in 2022 , + nephrolithiasis, no flank pan c/o but may have become symptomatic with anticoag, monitor Hgb   22. Drooling: continue scopolamine patch, helping.  23. Pneumonia: Quinolone started IV. Midline placed. Has allergy to PCN. Discussed with patient will stop Sunday. WBC reviewed and normalized.   -12/16 Abx changed to PO levaquin yesterday, can DC midline  LOS: 12 days [place]2023, 8:21 PM

## 2022-10-16 NOTE — Plan of Care (Signed)
  Problem: RH Swallowing Goal: LTG Patient will consume least restrictive diet using compensatory strategies with assistance (SLP) Description: LTG:  Patient will consume least restrictive diet using compensatory strategies with assistance (SLP) Outcome: Completed/Met   Problem: RH Expression Communication Goal: LTG Patient will increase speech intelligibility (SLP) Description: LTG: Patient will increase speech intelligibility at word/phrase/conversation level with cues, % of the time (SLP) Outcome: Completed/Met

## 2022-10-16 NOTE — Progress Notes (Signed)
Occupational Therapy Session Note  Patient Details  Name: Barbara Krause MRN: [id number] Date of Birth: 24-Jul-1941  {CHL IP REHAB OT TIME CALCULATIONS:304400400}   Short Term Goals: {OT EVQ:0037944}  Skilled Therapeutic Interventions/Progress Updates:    Pt received in *** with *** out of 10 pain in ***. *** provided for pain relief  ADL: Pt completes ADL at overall *** Level. Skilled interventions include:  Neuromuscular Reeducation ****  Therapeutic exercise ***   Therapeutic activity ***  Pt left at end of session in *** with exit alarm on, call light in reach and all needs met   Therapy Documentation Precautions:  Precautions Precautions: Fall Restrictions Weight Bearing Restrictions: Yes RUE Weight Bearing: Non weight bearing Other Position/Activity Restrictions: RT Wrist fracture ~3 weeks ago General:   Vital Signs: Therapy Vitals Temp: 98.4 F (36.9 C) Pulse Rate: 66 Resp: 16 BP: 136/70 Patient Position (if appropriate): Lying Oxygen Therapy SpO2: 96 % O2 Device: Room Air Pain:   ADL: ADL Eating: Set up Where Assessed-Eating: Chair Grooming: Setup Where Assessed-Grooming: Sitting at sink Upper Body Bathing: Minimal assistance Where Assessed-Upper Body Bathing: Shower Lower Body Bathing: Supervision/safety Where Assessed-Lower Body Bathing: Shower Upper Body Dressing: Supervision/safety Where Assessed-Upper Body Dressing: Wheelchair Lower Body Dressing: Contact guard Where Assessed-Lower Body Dressing: Sitting at sink, Standing at sink Toileting: Contact guard Where Assessed-Toileting: Bedside Commode Toilet Transfer: Therapist, music Method: Counselling psychologist: Radiographer, therapeutic: Not assessed Tub/Shower Transfer Method: Unable to assess Social research officer, government: Curator Method: Ambulating, Stand pivot Youth worker: Grab bars, Academic librarian   Balance   Exercises:   Other Treatments:     Therapy/Group: {Therapy/Group:3049007}  Tonny Branch 10/16/2022, 5:41 PM

## 2022-10-16 NOTE — Progress Notes (Addendum)
Speech Language Pathology Discharge Summary  Patient Details  Name: Barbara Krause MRN: [id number] Date of Birth: [birth year]  Date of Discharge from SLP service:October 16, 2022  Today's Date: 10/16/2022 SLP Individual Time: 0900-0944 SLP Individual Time Calculation (min): 44 min   Skilled Therapeutic Interventions:   Pt seen this date for skilled ST intervention targeting speech intelligibility and pt/family education goals outlined in care plan. Pt received awake/alert and OOB in recliner chair. Agreeable to intervention in hospital room. Pleasant and participatory.  Today's session with emphasis on pt education in preparation for upcoming hospital discharge. SLP provided extensive verbal and written education re: safe swallowing strategies/aspiration precautions, compensatory speech intelligibility strategies, EMST, and s/sx's concerning for PNA - placed in education binder and reviewed with pt's daughter. Strongly recommended that caregivers and personal aides become familiar and trained in performing Heimlich maneuver at home, given high risk of choking with solid foods and pt's preference to take medications whole and eat solid textures. Also strongly recommended TID (before meals) vs BID oral care, which pt stated does not want to do. Additionally, reviewed with pt that is she is exhibiting s/sx's concerning for pulmonary compromise to include coughing, SOB, chest pain and/or tightness, fever, fatigue, and/or shallow/rapid breathing, to reach out to her PCP given her increased risk of developing PNA in the setting of chronic aspiration and decreased ambulation; she verbalized understanding of all information via teach back given overall Min A verbal cues.   Following education, pt completed 3 sets of 5 repetitions on EMST set at 15 cm H2O; continues to be limited at times by incoordination and motor planning. Nevertheless, pt noted to utilize diaphragmatic breathing more readily this date,  particularly during structured vs unstructured speech tasks. All verbal communication, at the phrase and short sentence level, was subjectively noted to be ~90% in a quiet environment with a familiar listener. No drooling noted with scopolamine patch in place. Pt reports decrease in coughing and denies use of suctioning since use initiation of patch.  Pt left in room and OOB in recliner chair with all safety measures activated and call bell within reach. Continue per current ST POC.  Patient has met 2 of 2 long term goals.  Patient to discharge at overall Supervision;Min level.  Reasons goals not met: N/A   Clinical Impression/Discharge Summary:    During CIR admission, patient has demonstrated subtle improvements in her ability to implement compensatory techniques, within functional contexts, given the addition of scopolamine patch. Therefore, pt has met 2 of 2 long term goals. Patient to discharge at overall sup to min A level for speech intelligibility and consistent implementation of safe swallowing strategies. 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 Patient's daughter has been present and educated on ST recommendations for 24/7 supervision and assistance, safe swallowing + compensatory speech intelligibility strategies, and guidelines for use of EMST.   SLP provided pt with verbal and written education in an effort to facilitate self-advocacy/agency and assist pt with recall of compensatory strategies which include: MBSS results from study completed during this admission (10/13/2022) ,implementing volitional cough after each swallow, MBSS performing thorough oral care TID (preferably prior to PO intake, which pt declines), speaking "loud and clear" with diaphragmatic breathing techniques, and increasing her awareness of s/sx concerning for PNA.   Pt continues to exhibit  oropharyngeal difficulty with PO intake; therefore, typically opts for nutritional supplements (e.g. Boost) vs solid  textures for meals. Discharged on Dysphagia 3 and thin liquids, at pt's request, despite extensive education on risks and ramifications of chronic aspiration and PNA (pt has voiced that she wants limited modifications to her diet consistencies nor alternate means of nutrition/hydration - short and long-term, and accepts risks). Pt prefers pills whole in applesauce; however, strongly recommended to pt and family to crush medications and place in puree at home given increased choking risk; they verbalized understanding.   Recommend short-course of [name] ST to continue addressing cough response and f/u with implementation of education within home environment. Education provided to daughter after the conclusion of today's session; she voiced understanding of all information.  Care Partner:  Caregiver Able to Provide Assistance: Yes  Type of Caregiver Assistance: Cognitive;Physical  Recommendation:  Home Health SLP;24 hour supervision/assistance  Rationale for SLP Follow Up: Maximize swallowing safety;Maximize functional communication;Reduce caregiver burden   Equipment: suction machine   Reasons for discharge: Discharged from hospital   Patient/Family Agrees with Progress Made and Goals Achieved: Yes    [name] A [name] 10/16/2022, 9:52 AM

## 2022-10-17 DIAGNOSIS — K5900 Constipation, unspecified: Secondary | ICD-10-CM

## 2022-10-17 MED ORDER — SORBITOL 70 % SOLN
60.0000 mL | Freq: Every day | Status: DC
  Administered 2022-10-17 – 2022-10-18 (×2): 60 mL via ORAL
  Filled 2022-10-17 (×2): qty 60

## 2022-10-17 MED ORDER — POLYETHYLENE GLYCOL 3350 17 G PO PACK: 17.0000 g | PACK | Freq: Every day | ORAL | Status: DC | PRN

## 2022-10-17 NOTE — Progress Notes (Signed)
Physical Therapy Session Note  Patient Details  Name: Barbara Krause MRN: [id number] Date of Birth: November 12, 1940  Today's Date: 10/17/2022 PT Individual Time: 0920-1018 PT Individual Time Calculation (min): 58 min   Short Term Goals: Week 1:  PT Short Term Goal 1 (Week 1): pt will transfer sit<>stand with LRAD and CGA PT Short Term Goal 1 - Progress (Week 1): Progressing toward goal PT Short Term Goal 2 (Week 1): pt will transfer bed<>chair with LRAD and CGA PT Short Term Goal 2 - Progress (Week 1): Met PT Short Term Goal 3 (Week 1): pt will ambulate 57f with LRAD and CGA PT Short Term Goal 3 - Progress (Week 1): Met  Skilled Therapeutic Interventions/Progress Updates:    Chart reviewed and pt agreeable to therapy. [facility]. Session focused on review of functional mobility in preparation for discharge. Pt initiated session with set up assist for donning shoes. PT and daughter noted very slight potential swelling in L ankle that was not pitting. Pt reported no pain. Pt then completed sit to stand and [id number] with supervision + RW and used good technique to observe WB precautions. Pt then practice large-step stairs. Pt completed 4 steps with MinA + rails for ascending. Of note, pt had brief knee buckling and LOB on first descending step that was mitigated with guarding. Pt then finished descent with ModA + rails. Pt requested to try small-steps stairs, stating the height to be closer to her home setup. She completed 2 trials of small-step stairs with CGA + rails. Pt then retrieved object from ground, completed car transfer, and amb [id number] with supervision + RW. PT, pt, and daughter returned to room to answer final discharge questions. PT observed daughter successfully supervise pt transfer from [facility] recliner. At end of session, pt was left seated in recliner with alarm engaged, nurse call bell and all needs in reach.     Therapy  Documentation Precautions:  Precautions Precautions: Fall Restrictions Weight Bearing Restrictions: Yes RUE Weight Bearing: Non weight bearing Other Position/Activity Restrictions: RT Wrist fracture ~3 weeks ago      Therapy/Group: Individual Therapy  KirMarquette OldT, DPT 10/17/2022, 12:36 PM

## 2022-10-17 NOTE — Progress Notes (Signed)
PROGRESS NOTE   Subjective/Complaints: Concerned today about her constipation, hasn't had a BM in 4 days which is abnormal for her. Would use Colace at home; has Colace '100mg'$  ordered and took it this morning. Has Sennakot which she had last night but states it didn't help. Does not want an enema.  Cough improving, denies CP/SOB. No pain or concerns otherwise.     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 No results for input(s): "WBC", "HGB", "HCT", "PLT" in the last 72 hours.  Recent Labs    10/16/22 1023  NA 143  K 3.9  CL 108  CO2 26  GLUCOSE 87  BUN 20  CREATININE 0.85  CALCIUM 9.3     Intake/Output Summary (Last 24 hours) at 10/17/2022 1346 Last data filed at 10/17/2022 1306 Gross per 24 hour  Intake 1069.33 ml  Output --  Net 1069.33 ml       Pressure Injury 10/08/22 Hand Posterior;Right Stage 2 -  Partial thickness loss of dermis presenting as a shallow open injury with a red, pink wound bed without slough. device related; open wound found under cast on right hand (Active)  10/08/22 1640  Location: Hand  Location Orientation: Posterior;Right  Staging: Stage 2 -  Partial thickness loss of dermis presenting as a shallow open injury with a red, pink wound bed without slough.  Wound Description (Comments): device related; open wound found under cast on right hand  Present on Admission: Yes    Physical Exam: Vital Signs Blood pressure (!) 137/94, pulse 64, temperature 98 F (36.7 C), temperature source Oral, resp. rate 17, height '5\' 6"'$  (1.676 m), weight 53.2 kg, SpO2 97 %.   General: No acute distress, BMI 20.1 Mood and affect are appropriate HEENT: drawn facies Heart: RRR, nl s1/s2, no m/r/g appreciated,  no pedal edema Lungs: Clear to auscultation, breathing unlabored, no rales or wheezes, no coughing during exam Abdomen: Positive bowel sounds, soft NTND Extremities: No clubbing, cyanosis, or edema, RUE in cast, wiggles digits Skin:  no evidence of rash, stage 2 to posterior of right hand Neurologic: motor strength is 4/5 in left deltoid, bicep, tricep, grip, ankle dorsiflexor and plantar flexor, + chorea  Cerebellar exam normal finger to nose to finger as well as heel to shin in LEFT upper and lower extremities RUE NT due to long arm cast, has some skin breakdown due to the cast Musculoskeletal: Adequate range of motion in all 4 extremities except as limited by RUE cast. No joint swelling, Right elbow in long arm cast  ModA for sit to stand   Assessment/Plan: 1. Functional deficits which require 3+ hours per day of interdisciplinary therapy in a comprehensive inpatient rehab setting. Physiatrist is providing close team supervision and 24 hour management of active medical problems listed below. Physiatrist and rehab team continue to assess barriers to discharge/monitor patient progress toward functional and medical goals  Care Tool:  Bathing    Body parts bathed by patient: Right arm, Chest, Abdomen, Front perineal area, Right upper leg, Left  upper leg, Face, Right lower leg   Body parts bathed by helper: Left arm, Buttocks     Bathing assist Assist Level: Moderate Assistance - Patient 50 - 74%     Upper Body Dressing/Undressing Upper body dressing   What is the patient wearing?: Pull over shirt    Upper body assist Assist Level: Supervision/Verbal cueing    Lower Body Dressing/Undressing Lower body dressing      What is the patient wearing?: Underwear/pull up, Pants     Lower body assist Assist for lower body dressing: Moderate Assistance - Patient 50 - 74%     Toileting Toileting    Toileting assist Assist for toileting: Minimal Assistance - Patient > 75%      Transfers Chair/bed transfer  Transfers assist     Chair/bed transfer assist level: Supervision/Verbal cueing     Locomotion Ambulation   Ambulation assist      Assist level: Supervision/Verbal cueing Assistive device: Walker-platform Max distance: 121f   Walk 10 feet activity   Assist     Assist level: Supervision/Verbal cueing Assistive device: Walker-platform   Walk 50 feet activity   Assist Walk 50 feet with 2 turns activity did not occur: Safety/medical concerns (fatigue and generalized weakness)  Assist level: Supervision/Verbal cueing Assistive device: Walker-platform    Walk 150 feet activity   Assist Walk 150 feet activity did not occur: Safety/medical concerns (fatigue and generalized weakness)  Assist level: Supervision/Verbal cueing Assistive device: Walker-platform    Walk 10 feet on uneven surface  activity   Assist     Assist level: Contact Guard/Touching assist Assistive device: Walker-platform   Wheelchair     Assist Is the patient using a wheelchair?: No Type of Wheelchair: Manual Wheelchair activity did not occur: N/A  Wheelchair assist level: Dependent - Patient 0%      Wheelchair 50 feet with 2 turns activity    Assist    Wheelchair 50 feet with 2 turns activity did not occur: N/A   Assist Level: Dependent - Patient 0%   Wheelchair 150 feet activity     Assist  Wheelchair 150 feet activity did not occur: N/A   Assist Level: Dependent - Patient 0%   Blood pressure (!) 137/94, pulse 64, temperature 98 F (36.7 C), temperature source Oral, resp. rate 17, height '5\' 6"'$  (1.676 m), weight 53.2 kg, SpO2 97 %.  Medical Problem List and Plan: 1. 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 Discussed with daughter  -Labs reviewed  -Plan for d/c home with daughter on Monday 10/18/22. Discussed antibiotics finished today  2.  Impaired mobility: continue Lovenox since ambulating 90 feet, Mod A sit to stand             -antiplatelet therapy: Aspirin 81 mg daily and Plavix 75 mg daily x 3 weeks (started 10/05/22) then aspirin alone 3. Pain Management: Tylenol as needed 4. Depression: continue Aricept 5 mg daily, Lexapro 20 mg daily             -antipsychotic agents: N/A 5. Neuropsych/cognition: This patient is capable of making decisions  on her own behalf. -Oriented with cues to date , remembered B day yesterday  6. Skin/Wound Care: Routine skin checks 7. Fluids/Electrolytes/Nutrition: Routine in and outs with follow-up chemistries 8.  Hypertension.   -Cozaar 50 mg daily.  Monitor with increased mobility. Has been well controlled.  -Klor increased to 37mq daily, down to 238m daily on 10/13/22.  -10/17/22 BP fairly stable, continued mild bradycardia, continue to monitor Vitals:   10/17/22 0425 10/17/22 1316  BP: 138/76 (!) 137/94  Pulse: (!) 51 64  Resp: 16 17  Temp: 98.2 F (36.8 C) 98 F (36.7 C)  SpO2: 93% 97%  9.  Hyperlipidemia.  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  [facility] tabs per day as per neurologist recommendations given possible side effects of parkisnonian symptoms. Discussed with daughter, she will bring her medication in from home 1124 Tobacco use.  NicoDerm patch.  Provide counseling 12.  Incidental findings of right thyroid nodule as well as 1.6 cm left upper lobe groundglass opacity.  Follow-up outpatient. Discussed follow-up in hospital with daughter given her concerns for infectious process, discussion is ongoing 1359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 Cough: CT reviewed and shows early infiltrate, IV  Levaquin started from Wednesday 10/13/22 to Thursday 10/14/22 then continued PO Levaquin Friday 10/15/22 to Sunday 10/17/22; now completed with abx course  -Cough improving 10/17/22, continue PRN Robitussin 16. Right wrist distal radius fracture. Followed by [facility]-Non weight-bearing on the right wrist , long arm cast  17. Thrush. Diflucan ordered. Completed 10/14/22. 18. Suboptimal potassium: klor increased to 4024mdaily, repeat today  -12/16 K+ stable 3.9  -Continue KClor 74m32maily  -Consider recheck BMP on outpatient follow up 19. AMS: WBC obtained and stable 20. Rising BUN: NS bolus ordered. Repeated 10/16/22.   -12/16 BUN improving, will give one additional 500cc bolus before DC midline (completed)  -Consider recheck BMP on outpatient follow up 21. Positive UA: discussed starting antibiotic, UCx neg hold off on abx   Had hematuria w/u in 2022 , + nephrolithiasis, no flank pan c/o but may have become symptomatic with anticoag, monitor Hgb   22. Drooling: continue scopolamine patch, helping.  23. Pneumonia: Quinolone started IV. Midline placed. Has allergy to PCN. Discussed with patient will stop Sunday. WBC reviewed and normalized.   -12/16 Abx changed to PO levaquin yesterday, can DC midline  -10/17/22 completed PO levaquin 24. Constipation: noted 10/17/22, no adequate BM x4 days per pt, one recorded in I/O on 10/16/22 AM which was first one since 10/11/22  -Continue Colace '100mg'$  daily (given this morning) and SennakotS QHS (given last night)  -Ordered Sorbitol 70% 60ml82mly starting today 10/17/22  -Ordered Miralax 17g PRN for additional relief if needed  -Will need to assess prior to d/c whether home bowel regimen needs adjusting  LOS: 13 days A FACE TO FACE [place]7/2023, 1:46 PM

## 2022-10-17 NOTE — Progress Notes (Incomplete)
Inpatient Rehabilitation Discharge Medication Review by a Pharmacist  A complete drug regimen review was completed for this patient to identify any potential clinically significant medication issues.  High Risk Drug Classes Is patient taking? Indication by Medication  Antipsychotic No   Anticoagulant No   Antibiotic No   Opioid No   Antiplatelet Yes Clopidogrel for additional week, Aspirin - CVA  Hypoglycemics/insulin No   Vasoactive Medication Yes Losartan - HTN  Chemotherapy No   Other Yes Alendronate - osteoporosis Atorvastatin - HLD Donepezil-dementia  Escitalopram- depression  Scopolamine patch-secretions  Deutetrabenazine- Huntington's disease      Type of Medication Issue Identified Description of Issue Recommendation(s)  Drug Interaction(s) (clinically significant)     Duplicate Therapy     Allergy     No Medication Administration End Date     Incorrect Dose     Additional Drug Therapy Needed     Significant med changes from prior encounter (inform family/care partners about these prior to discharge).    Other       Clinically significant medication issues were identified that warrant physician communication and completion of prescribed/recommended actions by midnight of the next day:  No  Pharmacist comments: None  Time spent performing this drug regimen review (minutes): 20 minutes  Thank you  Sandford Craze, PharmD. [facility] Acute Care PGY-1  10/17/2022 2:07 PM

## 2022-10-18 ENCOUNTER — Other Ambulatory Visit (HOSPITAL_COMMUNITY): Payer: Self-pay

## 2022-10-18 ENCOUNTER — Telehealth (HOSPITAL_COMMUNITY): Payer: Self-pay | Admitting: Pharmacy Technician

## 2022-10-18 MED ORDER — LOSARTAN POTASSIUM 25 MG PO TABS
50.0000 mg | ORAL_TABLET | Freq: Every day | ORAL | 0 refills | Status: DC
  Filled 2022-10-18: qty 60, 30d supply, fill #0

## 2022-10-18 MED ORDER — DONEPEZIL HCL 5 MG PO TABS
5.0000 mg | ORAL_TABLET | Freq: Every day | ORAL | 0 refills | Status: DC
  Filled 2022-10-18: qty 30, 30d supply, fill #0

## 2022-10-18 MED ORDER — ACETAMINOPHEN 325 MG PO TABS: 650.0000 mg | ORAL_TABLET | ORAL | Status: DC | PRN

## 2022-10-18 MED ORDER — DOCUSATE SODIUM 100 MG PO CAPS: 100.0000 mg | ORAL_CAPSULE | Freq: Every day | ORAL | 0 refills | Status: DC

## 2022-10-18 MED ORDER — ATORVASTATIN CALCIUM 40 MG PO TABS
40.0000 mg | ORAL_TABLET | Freq: Every day | ORAL | 0 refills | Status: DC
  Filled 2022-10-18: qty 30, 30d supply, fill #0

## 2022-10-18 MED ORDER — CLOPIDOGREL BISULFATE 75 MG PO TABS
75.0000 mg | ORAL_TABLET | Freq: Every day | ORAL | 0 refills | Status: DC
  Filled 2022-10-18: qty 6, 6d supply, fill #0

## 2022-10-18 MED ORDER — ESCITALOPRAM OXALATE 20 MG PO TABS
20.0000 mg | ORAL_TABLET | Freq: Every day | ORAL | 0 refills | Status: DC
  Filled 2022-10-18: qty 30, 30d supply, fill #0

## 2022-10-18 MED ORDER — SCOPOLAMINE 1 MG/3DAYS TD PT72
1.0000 | MEDICATED_PATCH | TRANSDERMAL | 0 refills | Status: DC
  Filled 2022-10-18: qty 10, 30d supply, fill #0

## 2022-10-18 MED ORDER — NICOTINE 21 MG/24HR TD PT24
MEDICATED_PATCH | TRANSDERMAL | 0 refills | Status: DC
  Filled 2022-10-18: qty 28, fill #0

## 2022-10-18 MED ORDER — AUSTEDO 12 MG PO TABS
ORAL_TABLET | ORAL | 0 refills | Status: AC
  Filled 2022-10-18: qty 90, 30d supply, fill #0

## 2022-10-18 NOTE — Progress Notes (Signed)
Inpatient Rehabilitation Discharge Medication Review by a Pharmacist   A complete drug regimen review was completed for this patient to identify any potential clinically significant medication issues.   High Risk Drug Classes Is patient taking? Indication by Medication  Antipsychotic No    Anticoagulant No    Antibiotic No    Opioid No    Antiplatelet Yes Clopidogrel for additional week, Aspirin - CVA  Hypoglycemics/insulin No    Vasoactive Medication Yes Losartan - HTN  Chemotherapy No    Other Yes Alendronate - osteoporosis Atorvastatin - HLD Donepezil-dementia   Escitalopram- depression   Scopolamine patch-secretions   Deutetrabenazine- Huntington's disease         Type of Medication Issue Identified Description of Issue Recommendation(s)  Drug Interaction(s) (clinically significant)        Duplicate Therapy        Allergy        No Medication Administration End Date        Incorrect Dose        Additional Drug Therapy Needed        Significant med changes from prior encounter (inform family/care partners about these prior to discharge).  Dose of deutetrabenazine reduced from 2 tablets (24 mg) twice daily to 1 tablet (12 mg) in AM and 2 tablets (24 mg) in PM  Communicate medication changes to patient and family upon discharge  Other            Clinically significant medication issues were identified that warrant physician communication and completion of prescribed/recommended actions by midnight of the next day:  No   Pharmacist comments: None   Time spent performing this drug regimen review (minutes): 20 minutes   Louanne Belton, PharmD, [facility] PGY1 Pharmacy Resident 10/18/2022 7:27 AM

## 2022-10-18 NOTE — Plan of Care (Signed)
  Problem: Sit to Stand Goal: LTG:  Patient will perform sit to stand in prep for activites of daily living with assistance level (OT) Description: LTG:  Patient will perform sit to stand in prep for activites of daily living with assistance level (OT) Outcome: Completed/Met   Problem: RH Grooming Goal: LTG Patient will perform grooming w/assist,cues/equip (OT) Description: LTG: Patient will perform grooming with assist, with/without cues using equipment (OT) Outcome: Completed/Met   Problem: RH Bathing Goal: LTG Patient will bathe all body parts with assist levels (OT) Description: LTG: Patient will bathe all body parts with assist levels (OT) Outcome: Completed/Met   Problem: RH Dressing Goal: LTG Patient will perform upper body dressing (OT) Description: LTG Patient will perform upper body dressing with assist, with/without cues (OT). Outcome: Completed/Met Goal: LTG Patient will perform lower body dressing w/assist (OT) Description: LTG: Patient will perform lower body dressing with assist, with/without cues in positioning using equipment (OT) Outcome: Completed/Met   Problem: RH Toileting Goal: LTG Patient will perform toileting task (3/3 steps) with assistance level (OT) Description: LTG: Patient will perform toileting task (3/3 steps) with assistance level (OT)  Outcome: Completed/Met   Problem: RH Toilet Transfers Goal: LTG Patient will perform toilet transfers w/assist (OT) Description: LTG: Patient will perform toilet transfers with assist, with/without cues using equipment (OT) Outcome: Completed/Met   Problem: RH Tub/Shower Transfers Goal: LTG Patient will perform tub/shower transfers w/assist (OT) Description: LTG: Patient will perform tub/shower transfers with assist, with/without cues using equipment (OT) Outcome: Completed/Met

## 2022-10-18 NOTE — Telephone Encounter (Signed)
Patient Advocate Encounter   Received notification that prior authorization for AUSTEDO (deutetrabenazine) '12MG'$  tablets is required.   PA submitted on 10/18/2022 Key [id number] Status is pending       Lyndel Safe, [place] Patient Advocate Specialist [place] Patient Advocate Team Direct Number: [phone]  Fax: [phone]

## 2022-10-19 ENCOUNTER — Telehealth: Payer: Self-pay | Admitting: Family Medicine

## 2022-10-19 NOTE — Telephone Encounter (Signed)
Home Health verbal orders [place] Agency Name: [facility]  Callback number: [phone]  Requesting OT/PT/Skilled nursing/Social Work/Speech:  Reason:PT  Frequency:2 x W  1 , 1 x W 2 , 2 x W 4   Please forward to [employer] pool or providers CMA  Also wanted to report that pt fell . She let go of the walter to soon and was lowered  to the floor with no injury

## 2022-10-19 NOTE — Telephone Encounter (Signed)
Agreed -

## 2022-10-19 NOTE — Telephone Encounter (Signed)
Verbal orders given to [facility] via telephone for PT 2 x week for 1 week , then  1 x week  2 weeks, then 2 x week for  4 weeks.  Per patient's Fall noted below, therapist states she checked patient's range of motion and everything was normal.

## 2022-10-20 NOTE — Progress Notes (Signed)
Physical Therapy Discharge Summary  Patient Details  Name: Barbara Krause MRN: [id number] Date of Birth: [birth year]  Date of Discharge from PT service:October 17, 2022   Patient has met 6 of 8 long term goals due to improved activity tolerance, improved balance, improved postural control, increased strength, increased range of motion, ability to compensate for deficits, improved awareness, and improved coordination. Patient to discharge at an ambulatory level Supervision using R PFRW. Patient's care partner requires assistance to provide the necessary physical and cognitive assistance at discharge. [name] daughter (who is an OT) was present for hands on family education - emphasis on stair navigation and was cleared to assist pt with transfers in room.    Reasons goals not met: Pt did not meet stair goal of 3 steps with 2 rails and CGA as pt currently requires min A to navigate 8 steps with 1 rail due to weakness, decreased balance, and impaired sequencing. Pt also did not meet car transfer goal of min A, as pt currently requires mod A due to posterior lean, difficulty sequencing, and generalized weakness.   Recommendation:  Patient will benefit from ongoing skilled PT services in home health setting to continue to advance safe functional mobility, address ongoing impairments in transfers, generalized strengthening and endurance, dynamic standing balance/coordination, gait training, NMR, and to minimize fall risk.   Equipment: 16x18 cushion for WC - already has PFRW and WC   Reasons for discharge: treatment goals met  Patient/family agrees with progress made and goals achieved: Yes  PT Discharge Precautions/Restrictions Precautions Precautions: Fall Restrictions Weight Bearing Restrictions: Yes RUE Weight Bearing: Weight bear through elbow only Pain Interference Pain Interference Pain Effect on Sleep: 1. Rarely or not at all Pain Interference with Therapy Activities: 1. Rarely or not at  all Pain Interference with Day-to-Day Activities: 1. Rarely or not at all Cognition Overall Cognitive Status: History of cognitive impairments - at baseline Arousal/Alertness: Awake/alert Orientation Level: Oriented X4 Memory: Impaired Awareness: Impaired Problem Solving: Impaired Safety/Judgment: Impaired Sensation Sensation Light Touch: Appears Intact Proprioception: Appears Intact Coordination Gross Motor Movements are Fluid and Coordinated: No Fine Motor Movements are Fluid and Coordinated: No Coordination and Movement Description: grossly uncoordinated due to hemiparesis, generalized weakness/deconditioning, decreased balance strategies, and impaired motor planning/sequencing Finger Nose Finger Test: [facility] but slow on LUE, unable to flex R elbow due to cast Heel Shin Test: [facility] bilaterally Motor  Motor Motor: Hemiplegia;Other (comment) Motor - Skilled Clinical Observations: grossly uncoordinated due to hemiparesis, generalized weakness/deconditioning, decreased balance strategies, and impaired motor planning/sequencing. Hx of Huntington's disease  Mobility Bed Mobility Bed Mobility: Rolling Right;Rolling Left;Sit to Supine;Supine to Sit Rolling Right: Independent Rolling Left: Independent Supine to Sit: Independent Sit to Supine: Independent Transfers Transfers: Sit to Stand;Stand to Sit;Stand Pivot Transfers Sit to Stand: Supervision/Verbal cueing Stand to Sit: Supervision/Verbal cueing Stand Pivot Transfers: Supervision/Verbal cueing Stand Pivot Transfer Details: Verbal cues for technique;Verbal cues for precautions/safety;Verbal cues for safe use of DME/AE Stand Pivot Transfer Details (indicate cue type and reason): verbal cues for turning technique and RW safety Transfer (Assistive device): Right platform walker Locomotion  Gait Ambulation: Yes Gait Assistance: Supervision/Verbal cueing Gait Distance (Feet): 150 Feet Assistive device: Right platform walker Gait  Assistance Details: Verbal cues for precautions/safety;Verbal cues for safe use of DME/AE Gait Assistance Details: verbal cues to remain within RW BOS and to increase step length Gait Gait: Yes Gait Pattern: Impaired Gait Pattern: Step-to pattern;Decreased step length - right;Decreased step length - left;Decreased stride length;Poor foot clearance - left;Poor  foot clearance - right;Narrow base of support;Trunk flexed Gait velocity: decreased Stairs / Additional Locomotion Stairs: Yes Stairs Assistance: Minimal Assistance - Patient > 75% Stair Management Technique: One rail Left Number of Stairs: 8 Height of Stairs: 6 Ramp: Contact Guard/touching assist (R PFRW) Pick up small object from the floor assist level: Minimal Assistance - Patient > 75% Pick up small object from the floor assistive device: small cup with R PFRW Wheelchair Mobility Wheelchair Mobility: No  Trunk/Postural Assessment  Cervical Assessment Cervical Assessment: Exceptions to [facility] (forward head) Thoracic Assessment Thoracic Assessment: Exceptions to [facility] Lumbar Assessment Lumbar Assessment: Exceptions to WFL (kyphosis) Postural Control Postural Control: Deficits on evaluation Trunk Control: posterior lean, delayed righting reactions  Balance Balance Balance Assessed: Yes Static Sitting Balance Static Sitting - Balance Support: Feet supported;Bilateral upper extremity supported Static Sitting - Level of Assistance: 5: Stand by assistance (supervision) Dynamic Sitting Balance Dynamic Sitting - Balance Support: Feet supported;No upper extremity supported Dynamic Sitting - Level of Assistance: 5: Stand by assistance (supervision) Static Standing Balance Static Standing - Balance Support: Bilateral upper extremity supported;During functional activity (R PFRW) Static Standing - Level of Assistance: 5: Stand by assistance (superviison) Dynamic Standing Balance Dynamic Standing - Balance Support: Bilateral upper  extremity supported;During functional activity (R PFRW) Dynamic Standing - Level of Assistance: 5: Stand by assistance (supervision) Extremity Assessment  RLE Assessment RLE Assessment: Exceptions to [facility] General Strength Comments: grossly generalized to 4/5 RLE Strength Right Hip Flexion: 4-/5 Right Hip ABduction: 4/5 Right Hip ADduction: 4/5 Right Knee Flexion: 4-/5 Right Knee Extension: 4-/5 Right Ankle Dorsiflexion: 4+/5 Right Ankle Plantar Flexion: 4+/5 LLE Assessment LLE Assessment: Exceptions to [facility] General Strength Comments: grossly generalized to 4/5 LLE Strength Left Hip Flexion: 4-/5 Left Hip ABduction: 4/5 Left Hip ADduction: 4/5 Left Knee Flexion: 4-/5 Left Knee Extension: 4-/5 Left Ankle Dorsiflexion: 4/5 Left Ankle Plantar Flexion: 4/5   Azalya Galyon M Lyn Hollingshead PT, DPT  10/20/2022, 12:45 PM

## 2022-10-22 DIAGNOSIS — R269 Unspecified abnormalities of gait and mobility: Secondary | ICD-10-CM | POA: Diagnosis not present

## 2022-10-22 DIAGNOSIS — E785 Hyperlipidemia, unspecified: Secondary | ICD-10-CM

## 2022-10-22 DIAGNOSIS — M810 Age-related osteoporosis without current pathological fracture: Secondary | ICD-10-CM

## 2022-10-22 DIAGNOSIS — F17200 Nicotine dependence, unspecified, uncomplicated: Secondary | ICD-10-CM

## 2022-10-22 DIAGNOSIS — I10 Essential (primary) hypertension: Secondary | ICD-10-CM

## 2022-10-22 DIAGNOSIS — Z7902 Long term (current) use of antithrombotics/antiplatelets: Secondary | ICD-10-CM

## 2022-10-22 DIAGNOSIS — S52501D Unspecified fracture of the lower end of right radius, subsequent encounter for closed fracture with routine healing: Secondary | ICD-10-CM

## 2022-10-22 DIAGNOSIS — F0390 Unspecified dementia without behavioral disturbance: Secondary | ICD-10-CM

## 2022-10-22 DIAGNOSIS — F32A Depression, unspecified: Secondary | ICD-10-CM

## 2022-10-22 DIAGNOSIS — I69320 Aphasia following cerebral infarction: Secondary | ICD-10-CM | POA: Diagnosis not present

## 2022-10-22 DIAGNOSIS — I69952 Hemiplegia and hemiparesis following unspecified cerebrovascular disease affecting left dominant side: Secondary | ICD-10-CM | POA: Diagnosis not present

## 2022-10-22 DIAGNOSIS — Z853 Personal history of malignant neoplasm of breast: Secondary | ICD-10-CM

## 2022-10-22 DIAGNOSIS — R1310 Dysphagia, unspecified: Secondary | ICD-10-CM | POA: Diagnosis not present

## 2022-10-22 DIAGNOSIS — W19XXXD Unspecified fall, subsequent encounter: Secondary | ICD-10-CM

## 2022-10-22 DIAGNOSIS — Z7982 Long term (current) use of aspirin: Secondary | ICD-10-CM

## 2022-10-22 DIAGNOSIS — G10 Huntington's disease: Secondary | ICD-10-CM

## 2022-10-28 ENCOUNTER — Telehealth: Payer: Self-pay

## 2022-10-28 NOTE — Patient Outreach (Signed)
  Care Coordination   10/28/2022 Name: Barbara Krause MRN: [id number] DOB: 09/30/1941   Care Coordination Outreach Attempts:  Successful outreach with patients daughter, [name].  Daughter states she is currently at work and would like to call back.  Call back phone number confirmed.   Follow Up Plan:  Additional outreach attempts will be made to offer the patient care coordination information and services.   Encounter Outcome:  Pt. Request to Call Back   Care Coordination Interventions:  No, not indicated    Quinn Plowman [facility] [place] [phone] direct line

## 2022-11-04 ENCOUNTER — Ambulatory Visit (INDEPENDENT_AMBULATORY_CARE_PROVIDER_SITE_OTHER): Payer: Medicare Other | Admitting: Family Medicine

## 2022-11-04 ENCOUNTER — Encounter: Payer: Self-pay | Admitting: Family Medicine

## 2022-11-04 VITALS — BP 134/72 | HR 73 | Temp 95.3°F | Resp 16 | Ht 66.0 in | Wt 125.4 lb

## 2022-11-04 DIAGNOSIS — G9341 Metabolic encephalopathy: Secondary | ICD-10-CM | POA: Diagnosis not present

## 2022-11-04 DIAGNOSIS — I63541 Cerebral infarction due to unspecified occlusion or stenosis of right cerebellar artery: Secondary | ICD-10-CM

## 2022-11-04 DIAGNOSIS — G10 Huntington's disease: Secondary | ICD-10-CM | POA: Diagnosis not present

## 2022-11-04 DIAGNOSIS — B370 Candidal stomatitis: Secondary | ICD-10-CM

## 2022-11-04 DIAGNOSIS — I639 Cerebral infarction, unspecified: Secondary | ICD-10-CM | POA: Diagnosis not present

## 2022-11-04 DIAGNOSIS — M545 Low back pain, unspecified: Secondary | ICD-10-CM

## 2022-11-04 DIAGNOSIS — S52501D Unspecified fracture of the lower end of right radius, subsequent encounter for closed fracture with routine healing: Secondary | ICD-10-CM

## 2022-11-04 DIAGNOSIS — E041 Nontoxic single thyroid nodule: Secondary | ICD-10-CM

## 2022-11-04 MED ORDER — ATORVASTATIN CALCIUM 40 MG PO TABS: 40.0000 mg | ORAL_TABLET | Freq: Every day | ORAL | 11 refills | Status: DC

## 2022-11-04 MED ORDER — SCOPOLAMINE 1 MG/3DAYS TD PT72: 1.0000 | MEDICATED_PATCH | TRANSDERMAL | 5 refills | Status: AC

## 2022-11-04 MED ORDER — FLUCONAZOLE 150 MG PO TABS: 150.0000 mg | ORAL_TABLET | Freq: Once | ORAL | 0 refills | Status: AC

## 2022-11-04 NOTE — Assessment & Plan Note (Signed)
Acute on chronic issue.  Patient has had increased right lower back pain since recent fall.  She has a history of multiple compression fractures.  On exam today she has no tenderness to palpation.  We discussed possibly considering an x-ray but we will hold off on at this time as she is not interested in treatment such as vertebroplasty for any compression fracture.  Can you pain control with Aleve as needed and continue home physical therapy.

## 2022-11-04 NOTE — Assessment & Plan Note (Signed)
Scopolamine patch as it helped significantly with oral secretions.

## 2022-11-04 NOTE — Patient Instructions (Signed)
For oral thrush: Use Diflucan 1 tablet x 1 now and repeat in 72 hours if not improving. Continue scopolamine patch for oral secretions. Continue atorvastatin 40 mg daily for improved cholesterol control and further stroke prevention.  We will reevaluate cholesterol in 3 months prior to physical. No clear need for back x-ray today but if back pain is continuing please let me know. Continue working on fall precautions and safety at home. We will plan reevaluation of thyroid either with ultrasound or CT in 6 months.

## 2022-11-04 NOTE — Assessment & Plan Note (Addendum)
Acute, treated with 21 days of Plavix 6 with ongoing aspirin.  Statin was increased to 40 mg daily.  I encouraged daughter to continue at the higher dose and we can consider reevaluation of cholesterol in 3 to 6 months.

## 2022-11-04 NOTE — Assessment & Plan Note (Addendum)
Noted incidentally on CT scan measuring up to 1.5 cm.  We can consider outpatient thyroid ultrasound or further reevaluation with ultrasound/CT in 3 to 6 months  Given additional health issues patient would like to wait on further evaluation at this time which I feel is reasonable.

## 2022-11-04 NOTE — Assessment & Plan Note (Signed)
Acute, recent mental status changes have resolved.  Per daughter and patient she is back at baseline.

## 2022-11-04 NOTE — Assessment & Plan Note (Signed)
For oral thrush: Use Diflucan 1 tablet x 1 now and repeat in 72 hours if not improving.

## 2022-11-04 NOTE — Progress Notes (Signed)
Patient ID: Barbara Krause, female    DOB: [birth year], 82 y.o.   MRN: [id number]  This visit was conducted in person.  BP 134/72   Pulse 73   Temp (!) 95.3 F (35.2 C) (Temporal)   Resp 16   Ht '5\' 6"'$  (1.676 m)   Wt 125 lb 6 oz (56.9 kg)   SpO2 100%   BMI 20.24 kg/m    CC:  Chief Complaint  Patient presents with   Hospitalization Follow-up    Subjective:   HPI: Barbara Krause is a 82 y.o. female with Huntington's disease, dementia on aricept presenting on 11/04/2022 for Hospitalization Follow-up  Admitted December 1 to October 04, 2022 for acute CVA.  Presenting symptoms included difficulty ambulating, lethargy and dysarthria 9 slurred BH)  DX with acute metabolic encephalopathy of unclear etiology, she did have an acute CVA in the cerebellum as noted on MRI, neurology involved recommending 21-day Plavix with ongoing aspirin and increase statin. Unclear if patient's stroke, given its location, is related to patient's symptoms, she does have a known diagnosis of Huntington's disease which appears to be more consistent with her symptoms per neurology. Regardless she continues to be having worsening difficulty with ambulation from baseline was recommended for inpatient rehab.   CT/MRI showed punctate focus of acute ischemia in the right cerebellar hemisphere. No hemorrhage or mass effect. 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 CT cervical spine negative for fracture or malalignment. Noted right thyroid nodule measuring 1.5 cm recommend outpatient follow-up. Admission chemistries unremarkable. Echocardiogram with ejection fraction of 60 to 65% no wall motion abnormalities. CT angiogram head and neck no emergent large vessel occlusion or high-grade stenosis. Noted findings of groundglass opacity left upper lobe measuring 1.6 cm recommend outpatient follow-up.   right wrist distal radius fracture followed by [facility] nonweightbearing on the right wrist  long-arm cast.   Admitted to rehab from October 04, 2022 to October 18, 2022. During rehab pneumonia noted and she was transition to [place].  Follow-up chest x-ray on 1212 showed no focal abnormality.   She and daughter report that she has returned to baseline speech  and strength. On PT and scheduled for PT eval.   Has upcoming  follow up with [facility] for right wrist injury. Daughter has noted some increase in swelling in right wrist since hard cast was D/C'd   Relevant past medical, surgical, family and social history reviewed and updated as indicated. Interim medical history since our last visit reviewed. Allergies and medications reviewed and updated. Outpatient Medications Prior to Visit  Medication Sig Dispense Refill   alendronate (FOSAMAX) 70 MG tablet Take 1 tablet (70 mg total) by mouth every 7 (seven) days. Take with a full glass of water on an empty stomach. (Patient taking differently: Take 70 mg by mouth every 7 (seven) days. Take with a full glass of water on an empty stomach. Every Saturday) 12 tablet 3   aspirin EC 81 MG tablet Take 1 tablet (81 mg total) by mouth daily. Swallow whole. 30 tablet 12   atorvastatin (LIPITOR) 40 MG tablet Take 1 tablet (40 mg total) by mouth daily. 30 tablet 0   Deutetrabenazine (AUSTEDO) 12 MG TABS Take 1 tablet by mouth daily in the morning,  and 2 tablets by mouth at bedtime 90 tablet 0   docusate sodium (COLACE) 100 MG capsule Take 1 capsule (100 mg total) by mouth daily. 10 capsule 0   donepezil (ARICEPT) 5 MG tablet Take  1 tablet (5 mg total) by mouth daily. 30 tablet 0   escitalopram (LEXAPRO) 20 MG tablet Take 1 tablet (20 mg total) by mouth daily. 30 tablet 0   losartan (COZAAR) 25 MG tablet Take 2 tablets (50 mg total) by mouth daily. 60 tablet 0   Multiple Vitamins-Minerals (ALIVE MULTI-VITAMIN PO) Take 1 tablet by mouth daily.     scopolamine (TRANSDERM-SCOP) 1 MG/3DAYS Place 1 patch (1.5 mg total) onto the skin every 3 (three)  days. 10 patch 0   acetaminophen (TYLENOL) 325 MG tablet Take 2 tablets (650 mg total) by mouth every 4 (four) hours as needed for mild pain (or temp > 37.5 C (99.5 F)).     clopidogrel (PLAVIX) 75 MG tablet Take 1 tablet (75 mg total) by mouth daily. 6 tablet 0   nicotine (NICODERM CQ - DOSED IN MG/24 HOURS) 21 mg/24hr patch 21 mg patch daily x 1 week then 14 mg patch daily x 3 weeks then 7 mg patch daily x 3 weeks and stop 28 patch 0   No facility-administered medications prior to visit.     Per HPI unless specifically indicated in ROS section below Review of Systems  Constitutional:  Negative for fatigue and fever.  HENT:  Negative for congestion.   Eyes:  Negative for pain.  Respiratory:  Negative for cough and shortness of breath.   Cardiovascular:  Negative for chest pain, palpitations and leg swelling.  Gastrointestinal:  Negative for abdominal pain.  Genitourinary:  Negative for dysuria and vaginal bleeding.  Musculoskeletal:  Negative for back pain.  Neurological:  Negative for syncope, light-headedness and headaches.  Psychiatric/Behavioral:  Negative for dysphoric mood.    Objective:  BP 134/72   Pulse 73   Temp (!) 95.3 F (35.2 C) (Temporal)   Resp 16   Ht '5\' 6"'$  (1.676 m)   Wt 125 lb 6 oz (56.9 kg)   SpO2 100%   BMI 20.24 kg/m   Wt Readings from Last 3 Encounters:  11/04/22 125 lb 6 oz (56.9 kg)  10/17/22 117 lb 4.6 oz (53.2 kg)  10/02/22 134 lb 0.6 oz (60.8 kg)      Physical Exam Vitals and nursing note reviewed.  Constitutional:      General: She is not in acute distress.    Appearance: Normal appearance. She is well-developed. She is not ill-appearing or toxic-appearing.  HENT:     Head: Normocephalic.     Right Ear: Hearing, tympanic membrane, ear canal and external ear normal.     Left Ear: Hearing, tympanic membrane, ear canal and external ear normal.     Nose: Nose normal.  Eyes:     General: Lids are normal. Lids are everted, no foreign bodies  appreciated.     Conjunctiva/sclera: Conjunctivae normal.     Pupils: Pupils are equal, round, and reactive to light.  Neck:     Thyroid: No thyroid mass or thyromegaly.     Vascular: No carotid bruit.     Trachea: Trachea normal.  Cardiovascular:     Rate and Rhythm: Normal rate and regular rhythm.     Heart sounds: Normal heart sounds, S1 normal and S2 normal. No murmur heard.    No gallop.  Pulmonary:     Effort: Pulmonary effort is normal. No respiratory distress.     Breath sounds: Normal breath sounds. No wheezing, rhonchi or rales.  Abdominal:     General: Bowel sounds are normal. There is no distension or abdominal bruit.  Palpations: Abdomen is soft. There is no fluid wave or mass.     Tenderness: There is no abdominal tenderness. There is no guarding or rebound.     Hernia: No hernia is present.  Musculoskeletal:     Cervical back: Normal range of motion and neck supple.  Lymphadenopathy:     Cervical: No cervical adenopathy.  Skin:    General: Skin is warm and dry.     Findings: No rash.  Neurological:     Mental Status: She is alert and oriented to person, place, and time.     Cranial Nerves: No cranial nerve deficit.     Sensory: No sensory deficit.     Motor: Abnormal muscle tone present.     Coordination: Coordination abnormal.     Gait: Gait abnormal.  Psychiatric:        Mood and Affect: Mood is not anxious or depressed.        Speech: Speech normal.        Behavior: Behavior normal. Behavior is cooperative.        Cognition and Memory: Cognition is not impaired. Memory is impaired.        Judgment: Judgment normal.       Results for orders placed or performed during the hospital encounter of 10/04/22  Urine Culture   Specimen: Urine, Clean Catch  Result Value Ref Range   Specimen Description URINE, CLEAN CATCH    Special Requests NONE    Culture      NO GROWTH Performed at [facility], [address] [place], [place] [address]    Report  Status 10/08/2022 FINAL   Comprehensive metabolic panel  Result Value Ref Range   Sodium 143 135 - 145 mmol/L   Potassium 3.6 3.5 - 5.1 mmol/L   Chloride 108 98 - 111 mmol/L   CO2 26 22 - 32 mmol/L   Glucose, Bld 96 70 - 99 mg/dL   BUN 16 8 - 23 mg/dL   Creatinine, Ser 0.78 0.44 - 1.00 mg/dL   Calcium 8.8 (L) 8.9 - 10.3 mg/dL   Total Protein 5.9 (L) 6.5 - 8.1 g/dL   Albumin 3.0 (L) 3.5 - 5.0 g/dL   AST 23 15 - 41 U/L   ALT 20 0 - 44 U/L   Alkaline Phosphatase 101 38 - 126 U/L   Total Bilirubin 0.8 0.3 - 1.2 mg/dL   GFR, Estimated >60 >60 mL/min   Anion gap 9 5 - 15  CBC with Differential/Platelet  Result Value Ref Range   WBC 7.0 4.0 - 10.5 K/uL   RBC 4.23 3.87 - 5.11 MIL/uL   Hemoglobin 12.8 12.0 - 15.0 g/dL   HCT 38.5 36.0 - 46.0 %   MCV 91.0 80.0 - 100.0 fL   MCH 30.3 26.0 - 34.0 pg   MCHC 33.2 30.0 - 36.0 g/dL   RDW 12.6 11.5 - 15.5 %   Platelets 206 150 - 400 K/uL   nRBC 0.0 0.0 - 0.2 %   Neutrophils Relative % 63 %   Neutro Abs 4.5 1.7 - 7.7 K/uL   Lymphocytes Relative 21 %   Lymphs Abs 1.4 0.7 - 4.0 K/uL   Monocytes Relative 13 %   Monocytes Absolute 0.9 0.1 - 1.0 K/uL   Eosinophils Relative 2 %   Eosinophils Absolute 0.1 0.0 - 0.5 K/uL   Basophils Relative 1 %   Basophils Absolute 0.1 0.0 - 0.1 K/uL   Immature Granulocytes 0 %  Abs Immature Granulocytes 0.02 0.00 - 0.07 K/uL  Basic metabolic panel  Result Value Ref Range   Sodium 141 135 - 145 mmol/L   Potassium 3.7 3.5 - 5.1 mmol/L   Chloride 105 98 - 111 mmol/L   CO2 27 22 - 32 mmol/L   Glucose, Bld 98 70 - 99 mg/dL   BUN 19 8 - 23 mg/dL   Creatinine, Ser 0.89 0.44 - 1.00 mg/dL   Calcium 9.0 8.9 - 10.3 mg/dL   GFR, Estimated >60 >60 mL/min   Anion gap 9 5 - 15  Magnesium  Result Value Ref Range   Magnesium 2.1 1.7 - 2.4 mg/dL  VITAMIN D 25 Hydroxy (Vit-D Deficiency, Fractures)  Result Value Ref Range   Vit D, 25-Hydroxy 91.08 30 - 100 ng/mL  Homocysteine  Result Value Ref Range   Homocysteine  10.8 0.0 - 19.2 umol/L  CBC with Differential/Platelet  Result Value Ref Range   WBC 6.4 4.0 - 10.5 K/uL   RBC 4.49 3.87 - 5.11 MIL/uL   Hemoglobin 13.4 12.0 - 15.0 g/dL   HCT 41.7 36.0 - 46.0 %   MCV 92.9 80.0 - 100.0 fL   MCH 29.8 26.0 - 34.0 pg   MCHC 32.1 30.0 - 36.0 g/dL   RDW 12.7 11.5 - 15.5 %   Platelets 245 150 - 400 K/uL   nRBC 0.0 0.0 - 0.2 %   Neutrophils Relative % 71 %   Neutro Abs 4.6 1.7 - 7.7 K/uL   Lymphocytes Relative 18 %   Lymphs Abs 1.2 0.7 - 4.0 K/uL   Monocytes Relative 9 %   Monocytes Absolute 0.6 0.1 - 1.0 K/uL   Eosinophils Relative 1 %   Eosinophils Absolute 0.0 0.0 - 0.5 K/uL   Basophils Relative 1 %   Basophils Absolute 0.0 0.0 - 0.1 K/uL   Immature Granulocytes 0 %   Abs Immature Granulocytes 0.02 0.00 - 0.07 K/uL  Comprehensive metabolic panel  Result Value Ref Range   Sodium 142 135 - 145 mmol/L   Potassium 3.3 (L) 3.5 - 5.1 mmol/L   Chloride 105 98 - 111 mmol/L   CO2 28 22 - 32 mmol/L   Glucose, Bld 116 (H) 70 - 99 mg/dL   BUN 26 (H) 8 - 23 mg/dL   Creatinine, Ser 0.93 0.44 - 1.00 mg/dL   Calcium 9.3 8.9 - 10.3 mg/dL   Total Protein 6.7 6.5 - 8.1 g/dL   Albumin 3.4 (L) 3.5 - 5.0 g/dL   AST 37 15 - 41 U/L   ALT 34 0 - 44 U/L   Alkaline Phosphatase 112 38 - 126 U/L   Total Bilirubin 0.7 0.3 - 1.2 mg/dL   GFR, Estimated >60 >60 mL/min   Anion gap 9 5 - 15  Urinalysis, Routine w reflex microscopic Urine, Clean Catch  Result Value Ref Range   Color, Urine YELLOW YELLOW   APPearance HAZY (A) CLEAR   Specific Gravity, Urine 1.025 1.005 - 1.030   pH 5.0 5.0 - 8.0   Glucose, UA NEGATIVE NEGATIVE mg/dL   Hgb urine dipstick MODERATE (A) NEGATIVE   Bilirubin Urine NEGATIVE NEGATIVE   Ketones, ur NEGATIVE NEGATIVE mg/dL   Protein, ur 30 (A) NEGATIVE mg/dL   Nitrite NEGATIVE NEGATIVE   Leukocytes,Ua MODERATE (A) NEGATIVE   RBC / HPF 21-50 0 - 5 RBC/hpf   WBC, UA >50 (H) 0 - 5 WBC/hpf   Bacteria, UA RARE (A) NONE SEEN   Squamous Epithelial  /  HPF 0-5 0 - 5   Mucus PRESENT   Procalcitonin - Baseline  Result Value Ref Range   Procalcitonin <0.10 ng/mL  Basic metabolic panel  Result Value Ref Range   Sodium 144 135 - 145 mmol/L   Potassium 3.7 3.5 - 5.1 mmol/L   Chloride 109 98 - 111 mmol/L   CO2 27 22 - 32 mmol/L   Glucose, Bld 88 70 - 99 mg/dL   BUN 26 (H) 8 - 23 mg/dL   Creatinine, Ser 0.84 0.44 - 1.00 mg/dL   Calcium 9.0 8.9 - 10.3 mg/dL   GFR, Estimated >60 >60 mL/min   Anion gap 8 5 - 15  Basic metabolic panel  Result Value Ref Range   Sodium 143 135 - 145 mmol/L   Potassium 4.4 3.5 - 5.1 mmol/L   Chloride 107 98 - 111 mmol/L   CO2 28 22 - 32 mmol/L   Glucose, Bld 96 70 - 99 mg/dL   BUN 27 (H) 8 - 23 mg/dL   Creatinine, Ser 0.88 0.44 - 1.00 mg/dL   Calcium 9.4 8.9 - 10.3 mg/dL   GFR, Estimated >60 >60 mL/min   Anion gap 8 5 - 15  Procalcitonin - Baseline  Result Value Ref Range   Procalcitonin 0.14 ng/mL  CBC with Differential/Platelet  Result Value Ref Range   WBC 17.4 (H) 4.0 - 10.5 K/uL   RBC 4.17 3.87 - 5.11 MIL/uL   Hemoglobin 12.9 12.0 - 15.0 g/dL   HCT 37.7 36.0 - 46.0 %   MCV 90.4 80.0 - 100.0 fL   MCH 30.9 26.0 - 34.0 pg   MCHC 34.2 30.0 - 36.0 g/dL   RDW 13.1 11.5 - 15.5 %   Platelets 199 150 - 400 K/uL   nRBC 0.0 0.0 - 0.2 %   Neutrophils Relative % 88 %   Neutro Abs 15.4 (H) 1.7 - 7.7 K/uL   Lymphocytes Relative 5 %   Lymphs Abs 0.8 0.7 - 4.0 K/uL   Monocytes Relative 6 %   Monocytes Absolute 1.0 0.1 - 1.0 K/uL   Eosinophils Relative 0 %   Eosinophils Absolute 0.0 0.0 - 0.5 K/uL   Basophils Relative 0 %   Basophils Absolute 0.0 0.0 - 0.1 K/uL   Immature Granulocytes 1 %   Abs Immature Granulocytes 0.08 (H) 0.00 - 0.07 K/uL  Basic metabolic panel  Result Value Ref Range   Sodium 141 135 - 145 mmol/L   Potassium 4.1 3.5 - 5.1 mmol/L   Chloride 105 98 - 111 mmol/L   CO2 27 22 - 32 mmol/L   Glucose, Bld 144 (H) 70 - 99 mg/dL   BUN 25 (H) 8 - 23 mg/dL   Creatinine, Ser 0.87 0.44  - 1.00 mg/dL   Calcium 9.0 8.9 - 10.3 mg/dL   GFR, Estimated >60 >60 mL/min   Anion gap 9 5 - 15  CBC with Differential/Platelet  Result Value Ref Range   WBC 10.1 4.0 - 10.5 K/uL   RBC 3.93 3.87 - 5.11 MIL/uL   Hemoglobin 12.0 12.0 - 15.0 g/dL   HCT 36.9 36.0 - 46.0 %   MCV 93.9 80.0 - 100.0 fL   MCH 30.5 26.0 - 34.0 pg   MCHC 32.5 30.0 - 36.0 g/dL   RDW 13.3 11.5 - 15.5 %   Platelets 188 150 - 400 K/uL   nRBC 0.0 0.0 - 0.2 %   Neutrophils Relative % 79 %   Neutro Abs 7.9 (H) 1.7 -  7.7 K/uL   Lymphocytes Relative 13 %   Lymphs Abs 1.3 0.7 - 4.0 K/uL   Monocytes Relative 8 %   Monocytes Absolute 0.8 0.1 - 1.0 K/uL   Eosinophils Relative 0 %   Eosinophils Absolute 0.0 0.0 - 0.5 K/uL   Basophils Relative 0 %   Basophils Absolute 0.0 0.0 - 0.1 K/uL   Immature Granulocytes 0 %   Abs Immature Granulocytes 0.04 0.00 - 0.07 K/uL  Basic metabolic panel  Result Value Ref Range   Sodium 143 135 - 145 mmol/L   Potassium 3.9 3.5 - 5.1 mmol/L   Chloride 108 98 - 111 mmol/L   CO2 26 22 - 32 mmol/L   Glucose, Bld 87 70 - 99 mg/dL   BUN 20 8 - 23 mg/dL   Creatinine, Ser 0.85 0.44 - 1.00 mg/dL   Calcium 9.3 8.9 - 10.3 mg/dL   GFR, Estimated >60 >60 mL/min   Anion gap 9 5 - 15     COVID 19 screen:  No recent travel or known exposure to COVID19 The patient denies respiratory symptoms of COVID 19 at this time. The importance of social distancing was discussed today.   Assessment and Plan Problem List Items Addressed This Visit     Acute CVA (cerebrovascular accident) ([place])   Relevant Medications   atorvastatin (LIPITOR) 40 MG tablet   Acute metabolic encephalopathy    Acute, recent mental status changes have resolved.  Per daughter and patient she is back at baseline.      Acute midline low back pain without sciatica    Acute on chronic issue.  Patient has had increased right lower back pain since recent fall.  She has a history of multiple compression fractures.  On exam today  she has no tenderness to palpation.  We discussed possibly considering an x-ray but we will hold off on at this time as she is not interested in treatment such as vertebroplasty for any compression fracture.  Can you pain control with Aleve as needed and continue home physical therapy.      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  Statin was increased to 40 mg daily.  I encouraged daughter to continue at the higher dose and we can consider reevaluation of cholesterol in 3 to 6 months.          Closed fracture of lower end of right radius with routine healing   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[place]) - Primary    Scopolamine patch as it helped significantly with oral secretions.      Oral candidiasis    For oral thrush: Use Diflucan 1 tablet x 1 now and repeat in 72 hours if not improving.      Relevant Medications   fluconazole (DIFLUCAN) 150 MG tablet   Right thyroid nodule    Noted incidentally on CT scan measuring up to 1.5 cm.  We can consider outpatient thyroid ultrasound or further reevaluation with ultrasound/CT in 3 to 6 months  Given additional health issues patient would like to wait on further evaluation at this time which I feel is reasonable.        Eliezer Lofts, MD

## 2022-11-05 ENCOUNTER — Inpatient Hospital Stay: Payer: Medicare Other | Admitting: Physical Medicine and Rehabilitation

## 2022-11-18 LAB — HEMOGLOBIN A1C

## 2022-11-26 ENCOUNTER — Ambulatory Visit (INDEPENDENT_AMBULATORY_CARE_PROVIDER_SITE_OTHER): Payer: Medicare Other

## 2022-11-26 VITALS — Ht 66.0 in | Wt 125.0 lb

## 2022-11-26 DIAGNOSIS — Z0000 Encounter for general adult medical examination without abnormal findings: Secondary | ICD-10-CM | POA: Diagnosis not present

## 2022-11-26 NOTE — Patient Instructions (Addendum)
Barbara Krause , Thank you for taking time to come for your Medicare Wellness Visit. I appreciate your ongoing commitment to your health goals. Please review the following plan we discussed and let me know if I can assist you in the future.   These are the goals we discussed:     Goals Addressed             This Visit's Progress    Patient Stated   On track    Would like to maintain current routine.       This is a list of the screening recommended for you and due dates:  Health Maintenance  Topic Date Due   COVID-19 Vaccine (3 - 2023-24 season) 12/12/2022*   Hepatitis C Screening: USPSTF Recommendation to screen - Ages 18-79 yo.  11/27/2023*   Screening for Lung Cancer  10/09/2023   DTaP/Tdap/Td vaccine (3 - Td or Tdap) 06/15/2030   Pneumonia Vaccine  Completed   Flu Shot  Completed   DEXA scan (bone density measurement)  Completed   Zoster (Shingles) Vaccine  Completed   HPV Vaccine  Aged Out  *Topic was postponed. The date shown is not the original due date.    Advanced directives: on file  Conditions/risks identified: none new  Next appointment: Follow up in one year for your annual wellness visit    Preventive Care 65 Years and Older, Female Preventive care refers to lifestyle choices and visits with your health care provider that can promote health and wellness. What does preventive care include? A yearly physical exam. This is also called an annual well check. Dental exams once or twice a year. Routine eye exams. Ask your health care provider how often you should have your eyes checked. Personal lifestyle choices, including: Daily care of your teeth and gums. Regular physical activity. Eating a healthy diet. Avoiding tobacco and drug use. Limiting alcohol use. Practicing safe sex. Taking low-dose aspirin every day. Taking vitamin and mineral supplements as recommended by your health care provider. What happens during an annual well check? The services and  screenings done by your health care provider during your annual well check will depend on your age, overall health, lifestyle risk factors, and family history of disease. Counseling  Your health care provider may ask you questions about your: Alcohol use. Tobacco use. Drug use. Emotional well-being. Home and relationship well-being. Sexual activity. Eating habits. History of falls. Memory and ability to understand (cognition). Work and work Statistician. Reproductive health. Screening  You may have the following tests or measurements: Height, weight, and BMI. Blood pressure. Lipid and cholesterol levels. These may be checked every 5 years, or more frequently if you are over 32 years old. Skin check. Lung cancer screening. You may have this screening every year starting at age 71 if you have a 30-pack-year history of smoking and currently smoke or have quit within the past 15 years. Fecal occult blood test (FOBT) of the stool. You may have this test every year starting at age 13. Flexible sigmoidoscopy or colonoscopy. You may have a sigmoidoscopy every 5 years or a colonoscopy every 10 years starting at age 40. Hepatitis C blood test. Hepatitis B blood test. Sexually transmitted disease (STD) testing. Diabetes screening. This is done by checking your blood sugar (glucose) after you have not eaten for a while (fasting). You may have this done every 1-3 years. Bone density scan. This is done to screen for osteoporosis. You may have this done starting at age 38.  Mammogram. This may be done every 1-2 years. Talk to your health care provider about how often you should have regular mammograms. Talk with your health care provider about your test results, treatment options, and if necessary, the need for more tests. Vaccines  Your health care provider may recommend certain vaccines, such as: Influenza vaccine. This is recommended every year. Tetanus, diphtheria, and acellular pertussis (Tdap,  Td) vaccine. You may need a Td booster every 10 years. Zoster vaccine. You may need this after age 49. Pneumococcal 13-valent conjugate (PCV13) vaccine. One dose is recommended after age 55. Pneumococcal polysaccharide (PPSV23) vaccine. One dose is recommended after age 52. Talk to your health care provider about which screenings and vaccines you need and how often you need them. This information is not intended to replace advice given to you by your health care provider. Make sure you discuss any questions you have with your health care provider. Document Released: 11/14/2015 Document Revised: 07/07/2016 Document Reviewed: 08/19/2015 Elsevier Interactive Patient Education  2017 [place] Prevention in the Home Falls can cause injuries. They can happen to people of all ages. There are many things you can do to make your home safe and to help prevent falls. What can I do on the outside of my home? Regularly fix the edges of walkways and driveways and fix any cracks. Remove anything that might make you trip as you walk through a door, such as a raised step or threshold. Trim any bushes or trees on the path to your home. Use bright outdoor lighting. Clear any walking paths of anything that might make someone trip, such as rocks or tools. Regularly check to see if handrails are loose or broken. Make sure that both sides of any steps have handrails. Any raised decks and porches should have guardrails on the edges. Have any leaves, snow, or ice cleared regularly. Use sand or salt on walking paths during winter. Clean up any spills in your garage right away. This includes oil or grease spills. What can I do in the bathroom? Use night lights. Install grab bars by the toilet and in the tub and shower. Do not use towel bars as grab bars. Use non-skid mats or decals in the tub or shower. If you need to sit down in the shower, use a plastic, non-slip stool. Keep the floor dry. Clean up any  water that spills on the floor as soon as it happens. Remove soap buildup in the tub or shower regularly. Attach bath mats securely with double-sided non-slip rug tape. Do not have throw rugs and other things on the floor that can make you trip. What can I do in the bedroom? Use night lights. Make sure that you have a light by your bed that is easy to reach. Do not use any sheets or blankets that are too big for your bed. They should not hang down onto the floor. Have a firm chair that has side arms. You can use this for support while you get dressed. Do not have throw rugs and other things on the floor that can make you trip. What can I do in the kitchen? Clean up any spills right away. Avoid walking on wet floors. Keep items that you use a lot in easy-to-reach places. If you need to reach something above you, use a strong step stool that has a grab bar. Keep electrical cords out of the way. Do not use floor polish or wax that makes floors slippery. If  you must use wax, use non-skid floor wax. Do not have throw rugs and other things on the floor that can make you trip. What can I do with my stairs? Do not leave any items on the stairs. Make sure that there are handrails on both sides of the stairs and use them. Fix handrails that are broken or loose. Make sure that handrails are as long as the stairways. Check any carpeting to make sure that it is firmly attached to the stairs. Fix any carpet that is loose or worn. Avoid having throw rugs at the top or bottom of the stairs. If you do have throw rugs, attach them to the floor with carpet tape. Make sure that you have a light switch at the top of the stairs and the bottom of the stairs. If you do not have them, ask someone to add them for you. What else can I do to help prevent falls? Wear shoes that: Do not have high heels. Have rubber bottoms. Are comfortable and fit you well. Are closed at the toe. Do not wear sandals. If you use a  stepladder: Make sure that it is fully opened. Do not climb a closed stepladder. Make sure that both sides of the stepladder are locked into place. Ask someone to hold it for you, if possible. Clearly mark and make sure that you can see: Any grab bars or handrails. First and last steps. Where the edge of each step is. Use tools that help you move around (mobility aids) if they are needed. These include: Canes. Walkers. Scooters. Crutches. Turn on the lights when you go into a dark area. Replace any light bulbs as soon as they burn out. Set up your furniture so you have a clear path. Avoid moving your furniture around. If any of your floors are uneven, fix them. If there are any pets around you, be aware of where they are. Review your medicines with your doctor. Some medicines can make you feel dizzy. This can increase your chance of falling. Ask your doctor what other things that you can do to help prevent falls. This information is not intended to replace advice given to you by your health care provider. Make sure you discuss any questions you have with your health care provider. Document Released: 08/14/2009 Document Revised: 03/25/2016 Document Reviewed: 11/22/2014 Elsevier Interactive Patient Education  2017 [employer].

## 2022-11-26 NOTE — Progress Notes (Signed)
Subjective:   Barbara Krause is a 82 y.o. female who presents for Medicare Annual (Subsequent) preventive examination.  Review of Systems    No ROS.  Medicare Wellness Virtual Visit.  Visual/audio telehealth visit, UTA vital signs.   See social history for additional risk factors.   Cardiac Risk Factors include: advanced age (>26mn, >>78women)     Objective:    Today's Vitals   11/26/22 1305  Weight: 125 lb (56.7 kg)  Height: '5\' 6"'$  (1.676 m)   Body mass index is 20.18 kg/m.     11/26/2022    1:44 PM 10/04/2022    2:00 PM 05/25/2022    5:10 PM 05/20/2022    3:32 PM 11/19/2021    3:30 PM 11/18/2020    8:18 AM  Advanced Directives  Does Patient Have a Medical Advance Directive? Yes Yes Yes Yes Yes Yes  Type of AParamed[place]of [place] will [place] will   Healthcare Power of [place] will  Does patient want to make changes to medical advance directive? No - Patient declined No - Patient declined No - Patient declined No - Patient declined Yes (MAU/Ambulatory/Procedural Areas - Information given)   Copy of [place]in Chart? Yes - validated most recent copy scanned in chart (See row information) Yes - validated most recent copy scanned in chart (See row information)   Yes - validated most recent copy scanned in chart (See row information) Yes - validated most recent copy scanned in chart (See row information)    Current Medications (verified) Outpatient Encounter Medications as of 11/26/2022  Medication Sig   acetaminophen (TYLENOL) 325 MG tablet Take 2 tablets (650 mg total) by mouth every 4 (four) hours as needed for mild pain (or temp > 37.5 C (99.5 F)).   alendronate (FOSAMAX) 70 MG tablet Take 1 tablet (70 mg total) by mouth every 7 (seven) days. Take with a full glass of water on an empty stomach. (Patient taking differently: Take 70 mg by mouth every 7 (seven) days.  Take with a full glass of water on an empty stomach. Every Saturday)   aspirin EC 81 MG tablet Take 1 tablet (81 mg total) by mouth daily. Swallow whole.   atorvastatin (LIPITOR) 40 MG tablet Take 1 tablet (40 mg total) by mouth daily.   clopidogrel (PLAVIX) 75 MG tablet Take 1 tablet (75 mg total) by mouth daily.   Deutetrabenazine (AUSTEDO) 12 MG TABS Take 1 tablet by mouth daily in the morning,  and 2 tablets by mouth at bedtime   docusate sodium (COLACE) 100 MG capsule Take 1 capsule (100 mg total) by mouth daily.   donepezil (ARICEPT) 5 MG tablet Take 1 tablet (5 mg total) by mouth daily.   escitalopram (LEXAPRO) 20 MG tablet Take 1 tablet (20 mg total) by mouth daily.   losartan (COZAAR) 25 MG tablet Take 2 tablets (50 mg total) by mouth daily.   Multiple Vitamins-Minerals (ALIVE MULTI-VITAMIN PO) Take 1 tablet by mouth daily.   nicotine (NICODERM CQ - DOSED IN MG/24 HOURS) 21 mg/24hr patch 21 mg patch daily x 1 week then 14 mg patch daily x 3 weeks then 7 mg patch daily x 3 weeks and stop   scopolamine (TRANSDERM-SCOP) 1 MG/3DAYS Place 1 patch (1.5 mg total) onto the skin every 3 (three) days.   No facility-administered encounter medications on file as of 11/26/2022.    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   Depression    History of chicken pox    History of colon polyps    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[place])    Kidney stone    Osteoporosis    Past Surgical History:  Procedure Laterality Date   CESAREAN SECTION     1977 and 1979   CHOLECYSTECTOMY     TOTAL MASTECTOMY Bilateral 2009   Family History  Problem Relation Age of Onset   Colon cancer Mother    Stroke Father    Heart disease Father    Ovarian cancer Sister    Healthy Daughter    Stomach cancer Maternal Grandmother    Diabetes Maternal Grandfather    Colon cancer Paternal Grandmother     Diabetes Paternal Grandfather    Healthy Daughter    Social History   Socioeconomic History   Marital status: Widowed    Spouse name: Fritz Pickerel   Number of children: 2   Years of education: Master's Degree in SW   Highest education level: Not on file  Occupational History   Not on file  Tobacco Use   Smoking status: Every Day    Packs/day: 0.75    Years: 50.00    Total pack years: 37.50    Types: Cigarettes   Smokeless tobacco: Never  Vaping Use   Vaping Use: Never used  Substance and Sexual Activity   Alcohol use: Yes    Comment: about 1 glass of wine x 2 a week   Drug use: Never   Sexual activity: Not Currently  Other Topics Concern   Not on file  Social History Narrative   Retired from Science writer work, 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 from [place] to [place] to be near family   Enjoys: smoking, going to the movies and out to eat, seeing theater   Exercise: not currently   Diet: eats breakfast and dinner, but not much during the day   Social Determinants of Radio broadcast assistant Strain: [place]  (11/26/2022)   Overall Financial Resource Strain (CARDIA)    Difficulty of Paying Living Expenses: Not hard at all  Food Insecurity: No Food Insecurity (11/26/2022)   Hunger Vital Sign    Worried About Running Out of Food in the Last Year: Never true    [place] in the Last Year: Never true  Transportation Needs: No Transportation Needs (11/26/2022)   PRAPARE - Hydrologist (Medical): No    Lack of Transportation (Non-Medical): No  Physical Activity: Sufficiently Active (11/19/2021)   Exercise Vital Sign    Days of Exercise per Week: 7 days    Minutes of Exercise per Session: 30 min  Stress: No Stress Concern Present (11/26/2022)   [place]    Feeling of Stress : Not at all  Social Connections: Socially Isolated (11/26/2022)   Social Connection and Isolation Panel  [NHANES]    Frequency of Communication with Friends and Family: More than three times a week    Frequency of Social Gatherings with Friends and Family: More than three times a week    Attends Religious Services: Never    Marine scientist or Organizations: No    Attends Archivist Meetings: Never    Marital Status: Widowed    Tobacco Counseling  Ready to quit: Not Answered Counseling given: Not Answered   Clinical Intake:  Pre-visit preparation completed: Yes        Diabetes: No  How often do you need to have someone help you when you read instructions, pamphlets, or other written materials from your doctor or pharmacy?: 3 - Sometimes    Interpreter Needed?: No      Activities of Daily Living    11/26/2022    1:05 PM 10/04/2022    2:00 PM  In your present state of health, do you have any difficulty performing the following activities:  Hearing? 0 0  Vision? 0 0  Difficulty concentrating or making decisions? 1 0  Comment Taking medication as directed   Walking or climbing stairs? 1 1  Comment Walker   Dressing or bathing? 0 1  Doing errands, shopping? 1 1  Comment Family Diplomatic Services operational officer and eating ? Y   Comment Aide assist with meal prep. Self feeds.   Using the Toilet? N   In the past six months, have you accidently leaked urine? N   Do you have problems with loss of bowel control? N   Managing your Medications? N   Managing your Finances? N   Comment Daughter assist   Housekeeping or managing your Housekeeping? Y   Comment Aide assist     Patient Care Team: Jinny Sanders, MD as PCP - General (Family Medicine) Kate Sable, MD as PCP - Cardiology (Cardiology) Tempie Hoist, MD as Referring Physician (Neurology)  Indicate any recent Medical Services you may have received from other than [facility] providers in the past year (date may be approximate).     Assessment:   This is a routine wellness examination for [place].  I  connected with  Barbara Krause on 11/26/22 by a audio enabled telemedicine application and verified that I am speaking with the correct person using two identifiers.  Patient Location: Home  Provider Location: Office/Clinic  I discussed the limitations of evaluation and management by telemedicine. The patient expressed understanding and agreed to proceed.   Hearing/Vision screen Hearing Screening - Comments:: Patient is able to hear conversational tones without difficulty.  No issues reported.   Vision Screening - Comments:: Wears corrective lenses  Dietary issues and exercise activities discussed: Current Exercise Habits: Home exercise routine, Type of exercise: walking;stretching, Time (Minutes): 30, Frequency (Times/Week): 6, Weekly Exercise (Minutes/Week): 180, Intensity: Mild  Good appetite. She tries to have a healthy diet.  Boost x2 daily Good water intake   Goals Addressed             This Visit's Progress    Patient Stated   On track    Would like to maintain current routine.       Depression Screen    11/26/2022    1:40 PM 08/03/2022    4:16 PM 01/18/2022    2:50 PM 11/19/2021    3:34 PM 11/18/2020    8:20 AM 06/30/2020    4:11 PM  PHQ 2/9 Scores  PHQ - 2 Score 0 1 0 0 0 0  PHQ- 9 Score  7 0  0 0    Fall Risk    11/26/2022    1:45 PM 11/19/2021    3:32 PM 11/24/2020    3:50 PM 11/18/2020    8:19 AM 06/30/2020    4:11 PM  Fall Risk   Falls in the past year? '1 1 1 1 1  '$ Number falls in past  yr: 0 0 '1 1 1  '$ Injury with Fall? 0 '1 1 1 1  '$ Comment  broke right arm  had to go to the hospital with head injury   Risk for fall due to :  Other (Comment)  History of fall(s) History of fall(s);Impaired balance/gait;Impaired mobility  Risk for fall due to: Comment  fell in the bathroom     Follow up  Falls prevention discussed  Falls evaluation completed;Falls prevention discussed Falls evaluation completed    FALL RISK PREVENTION PERTAINING TO THE HOME: Home free of  loose throw rugs in walkways, pet beds, electrical cords, etc? Yes  Adequate lighting in your home to reduce risk of falls? Yes   ASSISTIVE DEVICES UTILIZED TO PREVENT FALLS: Life alert? Yes  Use of a cane, walker or w/c? Yes  Grab bars in the bathroom? Yes  Shower chair or bench in shower? Yes  Elevated toilet seat or a handicapped toilet? Yes   TIMED UP AND GO: Was the test performed? No .   Cognitive Function: Patient is alert and oriented x3.     11/18/2020    8:22 AM  MMSE - Mini Mental State Exam  Orientation to time 5  Orientation to Place 5  Registration 3  Attention/ Calculation 5  Recall 3  Language- repeat 1        11/26/2022    1:46 PM  6CIT Screen  What Year? 0 points  What month? 0 points  What time? 0 points  Count back from 20 4 points    Immunizations Immunization History  Administered Date(s) Administered   Fluad Quad(high Dose 65+) 08/16/2019, 09/05/2020, 09/06/2021, 08/03/2022   Influenza Inj Mdck Quad Pf 08/16/2019   Influenza, High Dose Seasonal PF 08/16/2019   MMR 12/24/2013   PFIZER(Purple Top)SARS-COV-2 Vaccination 12/13/2019, 01/03/2020   Pneumococcal Conjugate-13 09/05/2017   Pneumococcal Polysaccharide-23 09/10/2009   Tdap 06/02/2015, 06/15/2020   Zoster Recombinat (Shingrix) 12/04/2018, 06/26/2019   Zoster, Live 11/04/2011   Screening Tests Health Maintenance  Topic Date Due   COVID-19 Vaccine (3 - 2023-24 season) 12/12/2022 (Originally 07/02/2022)   Hepatitis C Screening  11/27/2023 (Originally 10/04/1959)   Lung Cancer Screening  10/09/2023   DTaP/Tdap/Td (3 - Td or Tdap) 06/15/2030   Pneumonia Vaccine 3+ Years old  Completed   INFLUENZA VACCINE  Completed   DEXA SCAN  Completed   Zoster Vaccines- Shingrix  Completed   HPV VACCINES  Aged Out    Health Maintenance There are no preventive care reminders to display for this patient.  DG Chest 2 View: completed 10/12/22   Hepatitis C Screening: does not qualify.  Vision  Screening: Recommended annual ophthalmology exams for early detection of glaucoma and other disorders of the eye.  Dental Screening: Recommended annual dental exams for proper oral hygiene  Community Resource Referral / Chronic Care Management: CRR required this visit?  No   CCM required this visit?  No      Plan:     I have personally reviewed and noted the following in the patient's chart:   Medical and social history Use of alcohol, tobacco or illicit drugs  Current medications and supplements including opioid prescriptions. Patient is not currently taking opioid prescriptions. Functional ability and status Nutritional status Physical activity Advanced directives List of other physicians Hospitalizations, surgeries, and ER visits in previous 12 months Vitals Screenings to include cognitive, depression, and falls Referrals and appointments  In addition, I have reviewed and discussed with patient certain preventive protocols, quality  metrics, and best practice recommendations. A written personalized care plan for preventive services as well as general preventive health recommendations were provided to patient.     Leta Jungling, LPN   [id number]/[id number]

## 2022-12-01 ENCOUNTER — Telehealth: Payer: Self-pay | Admitting: Family Medicine

## 2022-12-01 NOTE — Telephone Encounter (Signed)
Is this outpatient physical therapy or home health referral?  Can a verbal order be given?

## 2022-12-01 NOTE — Telephone Encounter (Signed)
Erline Levine called form [facility] called to get an referral for patient for out patient physical therapy at [place] phone number [phone]. Call back number for Erline Levine is [phone]

## 2022-12-01 NOTE — Telephone Encounter (Signed)
Referral is for Outpatient PT at [facility] at [place].  Phone number [phone].

## 2022-12-02 ENCOUNTER — Other Ambulatory Visit: Payer: Self-pay | Admitting: Family Medicine

## 2022-12-02 DIAGNOSIS — G10 Huntington's disease: Secondary | ICD-10-CM

## 2022-12-02 NOTE — Telephone Encounter (Signed)
Referral placed.

## 2022-12-03 ENCOUNTER — Other Ambulatory Visit: Payer: Self-pay | Admitting: *Deleted

## 2022-12-03 MED ORDER — DONEPEZIL HCL 5 MG PO TABS: 5.0000 mg | ORAL_TABLET | Freq: Every day | ORAL | 0 refills | Status: DC

## 2022-12-06 ENCOUNTER — Encounter: Payer: Self-pay | Admitting: Family Medicine

## 2022-12-09 ENCOUNTER — Ambulatory Visit: Payer: Medicare Other | Attending: Family Medicine

## 2022-12-09 DIAGNOSIS — G10 Huntington's disease: Secondary | ICD-10-CM | POA: Insufficient documentation

## 2022-12-09 DIAGNOSIS — M6281 Muscle weakness (generalized): Secondary | ICD-10-CM | POA: Diagnosis present

## 2022-12-09 DIAGNOSIS — R2689 Other abnormalities of gait and mobility: Secondary | ICD-10-CM

## 2022-12-09 DIAGNOSIS — I63541 Cerebral infarction due to unspecified occlusion or stenosis of right cerebellar artery: Secondary | ICD-10-CM | POA: Diagnosis present

## 2022-12-09 DIAGNOSIS — R2681 Unsteadiness on feet: Secondary | ICD-10-CM | POA: Diagnosis present

## 2022-12-09 DIAGNOSIS — R293 Abnormal posture: Secondary | ICD-10-CM | POA: Diagnosis present

## 2022-12-09 DIAGNOSIS — R29818 Other symptoms and signs involving the nervous system: Secondary | ICD-10-CM | POA: Diagnosis present

## 2022-12-09 NOTE — Therapy (Signed)
OUTPATIENT PHYSICAL THERAPY NEURO EVALUATION   Patient Name: Barbara Krause MRN: [id number] DOB:1941/09/20, 82 y.o., female Today's Date: 12/09/2022   PCP: Jinny Sanders, MD REFERRING PROVIDER: Jinny Sanders, MD  END OF SESSION:  PT End of Session - 12/09/22 1359     Visit Number 1    Number of Visits 8    Date for PT Re-Evaluation 01/06/23    Authorization Type Medicare/BCBS    Progress Note Due on Visit 10    PT Start Time 1400    PT Stop Time 4098    PT Time Calculation (min) 45 min             Past Medical History:  Diagnosis Date   Breast cancer ([place]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   Depression    History of chicken pox    History of colon polyps    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[place])    Kidney stone    Osteoporosis    Past Surgical History:  Procedure Laterality Date   CESAREAN SECTION     1977 and [place] Bilateral 2009   Patient Active Problem List   Diagnosis Date Noted   Acute metabolic encephalopathy 11/91/4782   Closed fracture of lower end of right radius with routine healing 11/04/2022   Constipation 10/17/2022   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   Oral candidiasis 10/04/2022   Acute cough 10/04/2022   Right thyroid nodule 10/02/2022   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 10/02/2022   Acute CVA (cerebrovascular accident) ([place]) 10/01/2022   Coronary atherosclerosis 08/03/2022   Compression fracture of thoracolumbar vertebra with routine healing 08/03/2022   DNR (do not resuscitate) 08/03/2022   At risk for aspiration 08/03/2022   Dysphagia 08/03/2022   High cholesterol 08/03/2022   Dementia ([place]) 08/03/2022   Acute midline low back pain without sciatica 03/22/2022   Recurrent falls 11/26/2020   Moderate protein-calorie malnutrition ([place]) 06/30/2020   History of intracranial hemorrhage 06/15/2020   Aortic atherosclerosis ([place]) 08/30/2019   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[place]) 08/30/2019    Microscopic hematuria 04/02/2019   Osteoporosis 04/02/2019   Vitamin B12 deficiency 04/02/2019   Thoracic aortic aneurysm (TAA) ([place]) 04/02/2019   HTN (hypertension) 03/05/2019   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   Tobacco use 03/05/2019   Depression, major, single episode, complete remission ([place]) 03/05/2019   Hx of breast cancer 03/05/2019   S/P bilateral mastectomy 03/05/2019    ONSET DATE:   REFERRING DIAG: G10 (ICD-10-CM) - 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[place])  THERAPY DIAG:  Unsteadiness on feet  Other abnormalities of gait and mobility  Muscle weakness (generalized)  Other symptoms and signs involving the nervous system  Rationale for Evaluation and Treatment: Rehabilitation  SUBJECTIVE:  SUBJECTIVE STATEMENT: Pt had a fall around Christmas and they were concerned for stroke which turned out to be the case. Went to inpatient rehab for 2 weeks, then referred home with HHPT until now. Returns to clinic for follow-up assessment. Pt has caregiver from 12P-6P. Pt reports not feeling any lingering effects of CVA  Pt accompanied by:  [name], caregiver  PERTINENT HISTORY:   PAIN:  Are you having pain? No  PRECAUTIONS: Fall  WEIGHT BEARING RESTRICTIONS: No  FALLS: Has patient fallen in last 6 months? Yes. Number of falls 2  LIVING ENVIRONMENT: Lives with: lives alone Lives in: House/apartment Stairs: Yes: Internal: BR/BA on main floor steps;   and External: yes steps; can reach both Has following equipment at home: Gilford Rile - 2 wheeled  PLOF: Independent and Independent with basic ADLs, some assist for showering  PATIENT GOALS:   OBJECTIVE:   DIAGNOSTIC FINDINGS:   IMPRESSION: 1. No acute intracranial abnormalities. 2. Chronic microvascular disease and brain atrophy. 3. Remote left  cerebellar hemisphere infarct.  COGNITION: Overall cognitive status: Within functional limits for tasks assessed   SENSATION: WFL  COORDINATION: Grossly impaired with alternating movements Heel to shin WFL  EDEMA:    MUSCLE TONE: WNL  MUSCLE LENGTH: NT  DTRs:    POSTURE: forward head  LOWER EXTREMITY ROM:     AROM WFL  LOWER EXTREMITY MMT:    Grossly 4/5 BLE  BED MOBILITY:  indep  TRANSFERS: Assistive device utilized: Environmental consultant - 2 wheeled  Sit to stand: Modified independence Stand to sit: Modified independence Chair to chair: Modified independence Floor:  NT  RAMP:  Level of Assistance: SBA Assistive device utilized: Environmental consultant - 2 wheeled Ramp Comments:   CURB:  Level of Assistance: SBA Assistive device utilized: Environmental consultant - 2 wheeled Curb Comments:   STAIRS: Level of Assistance: SBA Stair Negotiation Technique: Step to Pattern with Bilateral Rails Number of Stairs: 5  Height of Stairs: 4-6"  Comments:   GAIT: Gait pattern: ataxic and wide BOS Distance walked:  Assistive device utilized: Environmental consultant - 2 wheeled Level of assistance: SBA Comments:   FUNCTIONAL TESTS:  5 times sit to stand: 20.43 Timed up and go (TUG): 21 sec w/ RW Berg Balance Scale: 39/56  M-CTSIB  Condition 1: Firm Surface, EO 30 Sec, Normal and Mild Sway  Condition 2: Firm Surface, EC 30 Sec, Mild Sway  Condition 3: Foam Surface, EO 30 Sec, Mild Sway  Condition 4: Foam Surface, EC 18 Sec, Moderate and Severe Sway      PATIENT EDUCATION: Education details: assessment details/findings Person educated: Patient Education method: Explanation Education comprehension: verbalized understanding  HOME EXERCISE PROGRAM: TBD  GOALS: Goals reviewed with patient? Yes  SHORT TERM GOALS: Target date: .same as LTG    LONG TERM GOALS: Target date: 01/06/2023    Patient will perform HEP with family/caregiver supervision for improved strength, balance, transfers, and gait  Baseline:   Goal status: INITIAL  2.  Patient will achieve 15 seconds for TUG test to manifest reduced risk for falls Baseline: 21 sec w/ RW Goal status: INITIAL  3.  Demo low risk for falls per score 45/56 Berg Balance Test Baseline: 39/56 Goal status: INITIAL  4.  Improved BLE strength and balance per time of 15 sec 5xSTS to reduce risk for falls Baseline: 20 sec Goal status: INITIAL    ASSESSMENT:  CLINICAL IMPRESSION: Patient is a 82 y.o. lady who was seen today for physical therapy evaluation and treatment for balance, gait, and  activity tolerance deficits. Exhibits high risk for falls and generalized weakness per outcome measures and notes reduced functional activity tolerance in her day to day requirements in household environment. Pt would benefit from PT services to address deficits and limitations and reduce risk for falls    OBJECTIVE IMPAIRMENTS: Abnormal gait, decreased activity tolerance, decreased balance, decreased coordination, difficulty walking, decreased strength, and decreased motor control .   ACTIVITY LIMITATIONS: carrying, lifting, stairs, transfers, and locomotion level  PARTICIPATION LIMITATIONS: meal prep, community activity, and activities of interest-walking outdoors  PERSONAL FACTORS: Age, Time since onset of injury/illness/exacerbation, and 1 comorbidity: HD  are also affecting patient's functional outcome.   REHAB POTENTIAL: Good  CLINICAL DECISION MAKING: Evolving/moderate complexity  EVALUATION COMPLEXITY: Moderate  PLAN:  PT FREQUENCY: 2x/week  PT DURATION: 4 weeks  PLANNED INTERVENTIONS: Therapeutic exercises, Therapeutic activity, Neuromuscular re-education, Balance training, Gait training, Patient/Family education, Self Care, Joint mobilization, Stair training, Vestibular training, Canalith repositioning, Orthotic/Fit training, DME instructions, Aquatic Therapy, Dry Needling, Electrical stimulation, Spinal mobilization, Taping, Ionotophoresis  '4mg'$ /ml Dexamethasone, and Manual therapy  PLAN FOR NEXT SESSION: corner balance activities  4:51 PM, 12/09/22 M. Sherlyn Lees, PT, DPT Physical Therapist- [place] Office Number: [phone]

## 2022-12-14 ENCOUNTER — Telehealth: Payer: Self-pay | Admitting: Family Medicine

## 2022-12-14 NOTE — Telephone Encounter (Signed)
Call I am not aware of any other formulations that would help with ruling in a similar way. Given the speech therapist recommended initiation of this medication I believe, they can ask speech therapy or her neurologist if they know of any other medication that would work in a similar fashion.

## 2022-12-14 NOTE — Telephone Encounter (Signed)
[facility] called stating thatscopolamine (TRANSDERM-SCOP) 1 MG/3DAYS  is not covered under patients insurance,would like to know is there any other patches that may be prescribed for her to run through the system to see if they can get approved by her insurance?

## 2022-12-15 ENCOUNTER — Emergency Department: Payer: Medicare Other

## 2022-12-15 ENCOUNTER — Encounter: Payer: Self-pay | Admitting: Emergency Medicine

## 2022-12-15 ENCOUNTER — Emergency Department
Admission: EM | Admit: 2022-12-15 | Discharge: 2022-12-15 | Disposition: A | Discharge status: Home / Self Care | Payer: Medicare Other | Attending: Emergency Medicine | Admitting: Emergency Medicine

## 2022-12-15 DIAGNOSIS — S0990XA Unspecified injury of head, initial encounter: Secondary | ICD-10-CM | POA: Diagnosis present

## 2022-12-15 DIAGNOSIS — W19XXXA Unspecified fall, initial encounter: Secondary | ICD-10-CM

## 2022-12-15 DIAGNOSIS — S0001XA Abrasion of scalp, initial encounter: Secondary | ICD-10-CM | POA: Diagnosis not present

## 2022-12-15 DIAGNOSIS — Y92009 Unspecified place in unspecified non-institutional (private) residence as the place of occurrence of the external cause: Secondary | ICD-10-CM | POA: Diagnosis not present

## 2022-12-15 DIAGNOSIS — W0110XA Fall on same level from slipping, tripping and stumbling with subsequent striking against unspecified object, initial encounter: Secondary | ICD-10-CM | POA: Diagnosis not present

## 2022-12-15 DIAGNOSIS — N3000 Acute cystitis without hematuria: Secondary | ICD-10-CM

## 2022-12-15 LAB — CBC WITH DIFFERENTIAL/PLATELET
Abs Immature Granulocytes: 0.03 10*3/uL (ref 0.00–0.07)
Basophils Absolute: 0 10*3/uL (ref 0.0–0.1)
Basophils Relative: 0 %
Eosinophils Absolute: 0 10*3/uL (ref 0.0–0.5)
Eosinophils Relative: 0 %
HCT: 38.3 % (ref 36.0–46.0)
Hemoglobin: 12.4 g/dL (ref 12.0–15.0)
Immature Granulocytes: 0 %
Lymphocytes Relative: 12 %
Lymphs Abs: 1.3 10*3/uL (ref 0.7–4.0)
MCH: 30.2 pg (ref 26.0–34.0)
MCHC: 32.4 g/dL (ref 30.0–36.0)
MCV: 93.2 fL (ref 80.0–100.0)
Monocytes Absolute: 0.9 10*3/uL (ref 0.1–1.0)
Monocytes Relative: 8 %
Neutro Abs: 8.8 10*3/uL — ABNORMAL HIGH (ref 1.7–7.7)
Neutrophils Relative %: 80 %
Platelets: 160 10*3/uL (ref 150–400)
RBC: 4.11 MIL/uL (ref 3.87–5.11)
RDW: 12.9 % (ref 11.5–15.5)
WBC: 11.1 10*3/uL — ABNORMAL HIGH (ref 4.0–10.5)
nRBC: 0 % (ref 0.0–0.2)

## 2022-12-15 LAB — COMPREHENSIVE METABOLIC PANEL
ALT: 19 U/L (ref 0–44)
AST: 26 U/L (ref 15–41)
Albumin: 3.8 g/dL (ref 3.5–5.0)
Alkaline Phosphatase: 111 U/L (ref 38–126)
Anion gap: 7 (ref 5–15)
BUN: 25 mg/dL — ABNORMAL HIGH (ref 8–23)
CO2: 28 mmol/L (ref 22–32)
Calcium: 9.1 mg/dL (ref 8.9–10.3)
Chloride: 105 mmol/L (ref 98–111)
Creatinine, Ser: 0.8 mg/dL (ref 0.44–1.00)
GFR, Estimated: >60 mL/min (ref >60)
Glucose, Bld: 132 mg/dL — ABNORMAL HIGH (ref 70–99)
Potassium: 4 mmol/L (ref 3.5–5.1)
Sodium: 140 mmol/L (ref 135–145)
Total Bilirubin: 0.9 mg/dL (ref 0.3–1.2)
Total Protein: 6.3 g/dL — ABNORMAL LOW (ref 6.5–8.1)

## 2022-12-15 LAB — URINALYSIS, ROUTINE W REFLEX MICROSCOPIC
Bacteria, UA: NONE SEEN
Bilirubin Urine: NEGATIVE
Glucose, UA: NEGATIVE mg/dL
Hgb urine dipstick: NEGATIVE
Ketones, ur: NEGATIVE mg/dL
Nitrite: POSITIVE — AB
Protein, ur: NEGATIVE mg/dL
Specific Gravity, Urine: 1.016 (ref 1.005–1.030)
pH: 5 (ref 5.0–8.0)

## 2022-12-15 LAB — LIPASE, BLOOD: Lipase: 41 U/L (ref 11–51)

## 2022-12-15 MED ORDER — CEPHALEXIN 500 MG PO CAPS: 500.0000 mg | ORAL_CAPSULE | Freq: Two times a day (BID) | ORAL | 0 refills | Status: AC

## 2022-12-15 NOTE — Discharge Instructions (Addendum)
Urinalysis findings are concerning for UTI. Take Keflex twice daily for seven days.  If you develop a rash from medication, please stop it.

## 2022-12-15 NOTE — Telephone Encounter (Signed)
This call is from the pharmacy and not the patient.  Dr. Diona Browner has addressed this with the family via [place].  I have forwarded this message for Dr. Lavena Bullion her neurologist at [facility] to see if she has any recommendations.  FYI to Dr. Diona Browner.

## 2022-12-15 NOTE — ED Triage Notes (Addendum)
Pt fell this morning around 1030. Pt has some dried blood and bruising to back of head. Pt uses walker at baseline and was walking outside when she fell. Hit back of head on concrete. Denies blood thinners. Daughter at bedside reports pt is a little altered more than normal. Pt A&Ox4 in triage.

## 2022-12-15 NOTE — ED Provider Notes (Signed)
[facility] Provider Note  Patient Contact: 4:04 PM (approximate)   History   Fall   HPI  Barbara Krause is a 82 y.o. female with a history of Huntington's disease, presents to the emergency department after a fall that occurred at home.  Patient's daughter reports that patient has developed some compulsive behaviors over the past several weeks where she likes to walk outside down a ramp to exit home manage.  She did hit her head on concrete.  Her tetanus status is up-to-date.  She does not use blood thinners.     Physical Exam   Triage Vital Signs: ED Triage Vitals  Enc Vitals Group     BP 12/15/22 1424 126/78     Pulse Rate 12/15/22 1424 73     Resp 12/15/22 1424 16     Temp 12/15/22 1424 97.6 F (36.4 C)     Temp Source 12/15/22 1424 Oral     SpO2 12/15/22 1424 97 %     Weight 12/15/22 1425 123 lb 7.3 oz (56 kg)     Height 12/15/22 1425 5' 6"$  (1.676 m)     Head Circumference --      Peak Flow --      Pain Score --      Pain Loc --      Pain Edu? --      Excl. in [place]? --     Most recent vital signs: Vitals:   12/15/22 1424  BP: 126/78  Pulse: 73  Resp: 16  Temp: 97.6 F (36.4 C)  SpO2: 97%     General: Alert and in no acute distress.  Patient has a macerated abrasion of the occipital scalp. Eyes:  PERRL. EOMI. Head: No acute traumatic findings ENT:      Nose: No congestion/rhinnorhea.      Mouth/Throat: Mucous membranes are moist. Neck: No stridor. No cervical spine tenderness to palpation. Cardiovascular:  Good peripheral perfusion Respiratory: Normal respiratory effort without tachypnea or retractions. Lungs CTAB. Good air entry to the bases with no decreased or absent breath sounds. Gastrointestinal: Bowel sounds 4 quadrants. Soft and nontender to palpation. No guarding or rigidity. No palpable masses. No distention. No CVA tenderness. Musculoskeletal: Full range of motion to all extremities.  Neurologic:  No gross focal  neurologic deficits are appreciated.  Skin:   No rash noted    ED Results / Procedures / Treatments   Labs (all labs ordered are listed, but only abnormal results are displayed) Labs Reviewed  CBC WITH DIFFERENTIAL/PLATELET - Abnormal; Notable for the following components:      Result Value   WBC 11.1 (*)    Neutro Abs 8.8 (*)    All other components within normal limits  COMPREHENSIVE METABOLIC PANEL - Abnormal; Notable for the following components:   Glucose, Bld 132 (*)    BUN 25 (*)    Total Protein 6.3 (*)    All other components within normal limits  URINALYSIS, ROUTINE W REFLEX MICROSCOPIC - Abnormal; Notable for the following components:   Color, Urine YELLOW (*)    APPearance HAZY (*)    Nitrite POSITIVE (*)    Leukocytes,Ua TRACE (*)    All other components within normal limits  LIPASE, BLOOD        RADIOLOGY  I personally viewed and evaluated these images as part of my medical decision making, as well as reviewing the written report by the radiologist.  ED Provider Interpretation: No acute abnormalities on  CTs of the head and cervical spine.   PROCEDURES:  Critical Care performed: No  Procedures   MEDICATIONS ORDERED IN ED: Medications - No data to display   IMPRESSION / MDM / [place] / ED COURSE  I reviewed the triage vital signs and the nursing notes.                              Assessment and plan: Fall:  UTI 82 year old female presents to the emergency department after mechanical fall.  Patient lost her balance while walking outside and hit her head against concrete.  CT head and CT max face showed no acute abnormality.  Urinalysis concerning for nitrate positive UTI.  CBC indicates mildly elevated white blood cell count.  CMP within range.  Will treat patient with Keflex twice daily for the next 7 days.  Patient education regarding wound care for occipital scalp abrasion was communicated to patient's daughter.  Tylenol was  recommended for discomfort.  Return precautions were given to return with new or worsening symptoms.  FINAL CLINICAL IMPRESSION(S) / ED DIAGNOSES   Final diagnoses:  Fall, initial encounter  Acute cystitis without hematuria     Rx / DC Orders   ED Discharge Orders          Ordered    cephALEXin (KEFLEX) 500 MG capsule  2 times daily        12/15/22 1740             Note:  This document was prepared using Dragon voice recognition software and may include unintentional dictation errors.   Vallarie Mare Tatum, PA-C 12/16/22 Lynnell Catalan    Duffy Bruce, MD 12/16/22 0020

## 2022-12-15 NOTE — ED Notes (Signed)
Head bandaged with nonstick dressing and

## 2022-12-15 NOTE — ED Notes (Signed)
Head wrapped after wound was cleaned and dried. Pt and caregiver given extra supplies to go home with. Pt given d/c paperwork, all questions answered. Pt educated on infection prevention. Pt refused vitals prior to discharge. "I just want to get out of here". Pts caregiver agreed and does not want pt to get vitals at this time. Pt is alert and oriented x4

## 2022-12-16 ENCOUNTER — Ambulatory Visit: Payer: Medicare Other | Admitting: Physical Therapy

## 2022-12-16 ENCOUNTER — Other Ambulatory Visit (HOSPITAL_COMMUNITY): Payer: Self-pay

## 2022-12-16 ENCOUNTER — Encounter: Payer: Self-pay | Admitting: Physical Therapy

## 2022-12-16 DIAGNOSIS — R2681 Unsteadiness on feet: Secondary | ICD-10-CM

## 2022-12-16 DIAGNOSIS — R29818 Other symptoms and signs involving the nervous system: Secondary | ICD-10-CM

## 2022-12-16 DIAGNOSIS — R2689 Other abnormalities of gait and mobility: Secondary | ICD-10-CM

## 2022-12-16 DIAGNOSIS — M6281 Muscle weakness (generalized): Secondary | ICD-10-CM

## 2022-12-16 NOTE — Telephone Encounter (Signed)
Tried to submit PA through [employer] [facility], received this error message:

## 2022-12-16 NOTE — Telephone Encounter (Signed)
PA forms received and placed in Dr. Devra Dopp office in box to complete.

## 2022-12-16 NOTE — Therapy (Signed)
OUTPATIENT PHYSICAL THERAPY NEURO TREATMENT   Patient Name: Barbara Krause MRN: [id number] DOB:Sep 18, 1941, 82 y.o., female Today's Date: 12/16/2022   PCP: Jinny Sanders, MD REFERRING PROVIDER: Jinny Sanders, MD  END OF SESSION:  PT End of Session - 12/16/22 1409     Visit Number 2    Number of Visits 8    Date for PT Re-Evaluation 01/06/23    Authorization Type Medicare/BCBS    Progress Note Due on Visit 10    PT Start Time 1401    PT Stop Time 1444    PT Time Calculation (min) 43 min    Equipment Utilized During Treatment Gait belt    Activity Tolerance Patient tolerated treatment well    Behavior During Therapy WFL for tasks assessed/performed              Past Medical History:  Diagnosis Date   Breast cancer ([place]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   Depression    History of chicken pox    History of colon polyps    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[place])    Kidney stone    Osteoporosis    Past Surgical History:  Procedure Laterality Date   CESAREAN SECTION     1977 and [place] Bilateral 2009   Patient Active Problem List   Diagnosis Date Noted   Acute metabolic encephalopathy 0000000   Closed fracture of lower end of right radius with routine healing 11/04/2022   Constipation 10/17/2022   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[place]) 10/04/2022   Oral candidiasis 10/04/2022   Acute cough 10/04/2022   Right thyroid nodule 10/02/2022   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 10/02/2022   Acute CVA (cerebrovascular accident) ([place]) 10/01/2022   Coronary atherosclerosis 08/03/2022   Compression fracture of thoracolumbar vertebra with routine healing 08/03/2022   DNR (do not resuscitate) 08/03/2022   At risk for aspiration 08/03/2022   Dysphagia 08/03/2022   High cholesterol 08/03/2022   Dementia ([place]) 08/03/2022   Acute midline low back pain without sciatica 03/22/2022   Recurrent falls 11/26/2020   Moderate  protein-calorie malnutrition ([place]) 06/30/2020   History of intracranial hemorrhage 06/15/2020   Aortic atherosclerosis ([place]) 08/30/2019   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[place]) 08/30/2019   Microscopic hematuria 04/02/2019   Osteoporosis 04/02/2019   Vitamin B12 deficiency 04/02/2019   Thoracic aortic aneurysm (TAA) ([place]) 04/02/2019   HTN (hypertension) 03/05/2019   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 I just lost my balance and fell going out to the porch. I was able to get up well, pushed up to my knees and stood up. I don't have any bruises. Per caregiver this happened before she arrived, also per caregiver no increased confusion/behavior changes.    Pt accompanied by:  [name], caregiver  PERTINENT HISTORY:   PAIN:  Are you having pain? No 0/10  PRECAUTIONS: Fall  WEIGHT BEARING RESTRICTIONS: No  FALLS: Has patient fallen in last 6 months? Yes. Number of falls 2  LIVING ENVIRONMENT: Lives with: lives alone Lives in: House/apartment Stairs: Yes: Internal: BR/BA on main floor steps;   and External: yes steps; can reach both Has following equipment at home: Gilford Rile - 2 wheeled  PLOF: Independent and Independent with basic ADLs, some assist for  showering  PATIENT GOALS:   OBJECTIVE:    TREATMENT  12/16/22  Orthostatics  Supine 112/72 HR 62 Seated 88/73 HR 75 Standing 86/72 HR 86 Standing x3 minutes 94/76 HR 76   TherEx  Nustep L4 x6 minutes BLEs for LE strength/endurance Gait 27f 2# each LE no device, MinA for balance   NMR  Tandem stance 3x30 seconds blue foam pad Min-ModA Backwards/forward walking in // bars no UEs MinA  Forward hurdle navigation with 2 fingers on // bars, up to [employer] laps    DIAGNOSTIC FINDINGS:   IMPRESSION: 1. No acute intracranial abnormalities. 2. Chronic microvascular disease and brain atrophy. 3. Remote left cerebellar hemisphere infarct.  COGNITION: Overall cognitive status: Within functional limits for tasks assessed   SENSATION: WFL  COORDINATION: Grossly impaired with alternating movements Heel to shin WFL  EDEMA:    MUSCLE TONE: WNL  MUSCLE LENGTH: NT  DTRs:    POSTURE: forward head  LOWER EXTREMITY ROM:     AROM WFL  LOWER EXTREMITY MMT:    Grossly 4/5 BLE  BED MOBILITY:  indep  TRANSFERS: Assistive device utilized: WEnvironmental consultant- 2 wheeled  Sit to stand: Modified independence Stand to sit: Modified independence Chair to chair: Modified independence Floor:  NT  RAMP:  Level of Assistance: SBA Assistive device utilized: WEnvironmental consultant- 2 wheeled Ramp Comments:   CURB:  Level of Assistance: SBA Assistive device utilized: WEnvironmental consultant- 2 wheeled Curb Comments:   STAIRS: Level of Assistance: SBA Stair Negotiation Technique: Step to Pattern with Bilateral Rails Number of Stairs: 5  Height of Stairs: 4-6"  Comments:   GAIT: Gait pattern: ataxic and wide BOS Distance walked:  Assistive device utilized: WEnvironmental consultant- 2 wheeled Level of assistance: SBA Comments:   FUNCTIONAL TESTS:  5 times sit to stand: 20.43 Timed up and go (TUG): 21 sec w/ RW Berg Balance Scale: 39/56  M-CTSIB  Condition 1: Firm Surface, EO 30 Sec, Normal and Mild Sway   Condition 2: Firm Surface, EC 30 Sec, Mild Sway  Condition 3: Foam Surface, EO 30 Sec, Mild Sway  Condition 4: Foam Surface, EC 18 Sec, Moderate and Severe Sway      PATIENT EDUCATION: Education details: assessment details/findings Person educated: Patient Education method: Explanation Education comprehension: verbalized understanding  HOME EXERCISE PROGRAM: TBD  GOALS: Goals reviewed with patient? Yes  SHORT TERM GOALS: Target date: .same as LTG    LONG TERM GOALS: Target date: 01/06/2023    Patient will perform HEP with family/caregiver supervision for improved strength, balance, transfers, and gait  Baseline:  Goal status: INITIAL  2.  Patient  will achieve 15 seconds for TUG test to manifest reduced risk for falls Baseline: 21 sec w/ RW Goal status: INITIAL  3.  Demo low risk for falls per score 45/56 Berg Balance Test Baseline: 39/56 Goal status: INITIAL  4.  Improved BLE strength and balance per time of 15 sec 5xSTS to reduce risk for falls Baseline: 20 sec Goal status: INITIAL    ASSESSMENT:  CLINICAL IMPRESSION:  Dessire arrives today doing OK, had a major fall at home yesterday before her caregiver arrived, went to the hospital and had no major injuries. Focused session today on general balance per POC and patient request, also encouraged safe transfer techniques with RW to assist in reducing fall risk moving forward. Noted rapid increase in fatigue with standing activities today, took orthostatics to r/o this as a reason for her fall.  Will continue efforts.   OBJECTIVE IMPAIRMENTS: Abnormal gait, decreased activity tolerance, decreased balance, decreased coordination, difficulty walking, decreased strength, and decreased motor control .   ACTIVITY LIMITATIONS: carrying, lifting, stairs, transfers, and locomotion level  PARTICIPATION LIMITATIONS: meal prep, community activity, and activities of interest-walking outdoors  PERSONAL FACTORS: Age, Time since  onset of injury/illness/exacerbation, and 1 comorbidity: HD  are also affecting patient's functional outcome.   REHAB POTENTIAL: Good  CLINICAL DECISION MAKING: Evolving/moderate complexity  EVALUATION COMPLEXITY: Moderate  PLAN:  PT FREQUENCY: 2x/week  PT DURATION: 4 weeks  PLANNED INTERVENTIONS: Therapeutic exercises, Therapeutic activity, Neuromuscular re-education, Balance training, Gait training, Patient/Family education, Self Care, Joint mobilization, Stair training, Vestibular training, Canalith repositioning, Orthotic/Fit training, DME instructions, Aquatic Therapy, Dry Needling, Electrical stimulation, Spinal mobilization, Taping, Ionotophoresis 67m/ml Dexamethasone, and Manual therapy  PLAN FOR NEXT SESSION: corner balance activities, general strengthening   KDeniece ReePT DPT PN2

## 2022-12-16 NOTE — Telephone Encounter (Signed)
PA completed and faxed to [place] at [phone].

## 2022-12-20 ENCOUNTER — Telehealth: Payer: Self-pay | Admitting: *Deleted

## 2022-12-20 NOTE — Transitions of Care (Post Inpatient/ED Visit) (Signed)
   12/20/2022  Name: Barbara Krause MRN: [id number] DOB: [birth year]  Today's TOC FU Call Status: Today's TOC FU Call Status:: Successful TOC FU Call Competed TOC FU Call Complete Date: 12/20/22  Transition Care Management Follow-up Telephone Call Date of Discharge: 12/16/22 Discharge Facility: [facility]) Type of Discharge: Emergency Department Reason for ED Visit: Other: (fall) How have you been since you were released from the hospital?: Better Any questions or concerns?: No  Items Reviewed: Did you receive and understand the discharge instructions provided?: Yes Medications obtained and verified?: Yes (Medications Reviewed) Any new allergies since your discharge?: No Dietary orders reviewed?: NA Do you have support at home?: Yes People in Home: child(ren), dependent Name of Support/Comfort Primary Source: [place] and Equipment/Supplies: [place] Ordered?: No Any new equipment or medical supplies ordered?: No  Functional Questionnaire: Do you need assistance with bathing/showering or dressing?: No Do you need assistance with meal preparation?: No Do you need assistance with eating?: No Do you have difficulty maintaining continence: No Do you need assistance with getting out of bed/getting out of a chair/moving?: No Do you have difficulty managing or taking your medications?: No  Folllow up appointments reviewed: PCP Follow-up appointment confirmed?: No (Patient is refusing follow up appointment. PCP notified) MD Provider Line Number:[phone] Given: No [facility] Follow-up appointment confirmed?: NA Do you need transportation to your follow-up appointment?: No Do you understand care options if your condition(s) worsen?: Yes-patient verbalized understanding  SDOH Interventions Today    Flowsheet Row Most Recent Value  SDOH Interventions   Food Insecurity Interventions Intervention Not Indicated  Housing  Interventions Intervention Not Indicated  Transportation Interventions Intervention Not Indicated      Interventions Today    Flowsheet Row Most Recent Value  Exercise Interventions   Exercise Discussed/Reviewed Exercise Discussed  [Patient is going to PT]         Barbara [facility] [phone]

## 2022-12-21 ENCOUNTER — Ambulatory Visit: Payer: Medicare Other | Admitting: Rehabilitative and Restorative Service Providers"

## 2022-12-21 ENCOUNTER — Ambulatory Visit: Payer: Medicare Other

## 2022-12-21 DIAGNOSIS — R2689 Other abnormalities of gait and mobility: Secondary | ICD-10-CM

## 2022-12-21 DIAGNOSIS — R29818 Other symptoms and signs involving the nervous system: Secondary | ICD-10-CM

## 2022-12-21 DIAGNOSIS — R2681 Unsteadiness on feet: Secondary | ICD-10-CM

## 2022-12-21 DIAGNOSIS — I63541 Cerebral infarction due to unspecified occlusion or stenosis of right cerebellar artery: Secondary | ICD-10-CM

## 2022-12-21 DIAGNOSIS — M6281 Muscle weakness (generalized): Secondary | ICD-10-CM

## 2022-12-21 NOTE — Therapy (Signed)
OUTPATIENT PHYSICAL THERAPY NEURO TREATMENT   Patient Name: Barbara Krause MRN: [id number] DOB:1941-10-04, 82 y.o., female Today's Date: 12/21/2022   PCP: Jinny Sanders, MD REFERRING PROVIDER: Jinny Sanders, MD  END OF SESSION:  PT End of Session - 12/21/22 1451     Visit Number 3    Number of Visits 8    Date for PT Re-Evaluation 01/06/23    Authorization Type Medicare/BCBS    Progress Note Due on Visit 10    PT Start Time [id number]    PT Stop Time [id number]    PT Time Calculation (min) 45 min    Equipment Utilized During Treatment Gait belt    Activity Tolerance Patient tolerated treatment well    Behavior During Therapy WFL for tasks assessed/performed              Past Medical History:  Diagnosis Date   Breast cancer ([place]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   Depression    History of chicken pox    History of colon polyps    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[place])    Kidney stone    Osteoporosis    Past Surgical History:  Procedure Laterality Date   CESAREAN SECTION     1977 and [place] Bilateral 2009   Patient Active Problem List   Diagnosis Date Noted   Acute metabolic encephalopathy 0000000   Closed fracture of lower end of right radius with routine healing 11/04/2022   Constipation 10/17/2022   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   Oral candidiasis 10/04/2022   Acute cough 10/04/2022   Right thyroid nodule 10/02/2022   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 10/02/2022   Acute CVA (cerebrovascular accident) ([place]) 10/01/2022   Coronary atherosclerosis 08/03/2022   Compression fracture of thoracolumbar vertebra with routine healing 08/03/2022   DNR (do not resuscitate) 08/03/2022   At risk for aspiration 08/03/2022   Dysphagia 08/03/2022   High cholesterol 08/03/2022   Dementia ([place]) 08/03/2022   Acute midline low back pain without sciatica 03/22/2022   Recurrent falls 11/26/2020   Moderate  protein-calorie malnutrition ([place]) 06/30/2020   History of intracranial hemorrhage 06/15/2020   Aortic atherosclerosis ([place]) 08/30/2019   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[place]) 08/30/2019   Microscopic hematuria 04/02/2019   Osteoporosis 04/02/2019   Vitamin B12 deficiency 04/02/2019   Thoracic aortic aneurysm (TAA) ([place]) 04/02/2019   HTN (hypertension) 03/05/2019   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   Tobacco use 03/05/2019   Depression, major, single episode, complete remission ([place]) 03/05/2019   Hx of breast cancer 03/05/2019   S/P bilateral mastectomy 03/05/2019    ONSET DATE:   REFERRING DIAG: G10 (ICD-10-CM) - 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[place])  THERAPY DIAG:  Unsteadiness on feet  Other abnormalities of gait and mobility  Muscle weakness (generalized)  Other symptoms and signs involving the nervous system  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HCC)  Rationale for Evaluation and Treatment: Rehabilitation  SUBJECTIVE:  SUBJECTIVE STATEMENT:  No issues since the fall, denies pain/soreness, lightheadedness   Pt accompanied by:  [name], caregiver  PERTINENT HISTORY:   PAIN:  Are you having pain? No 0/10  PRECAUTIONS: Fall  WEIGHT BEARING RESTRICTIONS: No  FALLS: Has patient fallen in last 6 months? Yes. Number of falls 2  LIVING ENVIRONMENT: Lives with: lives alone Lives in: House/apartment Stairs: Yes: Internal: BR/BA on main floor steps;   and External: yes steps; can reach both Has following equipment at home: Gilford Rile - 2 wheeled  PLOF: Independent and Independent with basic ADLs, some assist for showering  PATIENT GOALS:   OBJECTIVE:   TODAY'S TREATMENT: 12/21/22 Activity Comments  NU-step resistance levels x 8 min   Corner balance -EO/EC 2x30 sec -head turns  3x -semi-tandem 2x15 sec--attempted EC but too unsteady  LAQ 3x10  5#  Seated march 2x10 5#  Sidestepping x 2 min 5# along counter  Hamstring curls 2x10 5#, cushio btwn cabinet and knees to help form/isolation    PATIENT EDUCATION: Education details: assessment details/findings Person educated: Patient Education method: Explanation Education comprehension: verbalized understanding  HOME EXERCISE PROGRAM: Access Code: [email or web address] URL: [email or web address] Date: 12/21/2022 Prepared by: Sherlyn Lees  Exercises - Standing Balance in Corner with Eyes Closed  - 1 x daily - 7 x weekly - 3 sets - 30 sec hold - Corner Balance Feet Apart: Eyes Closed With Head Turns  - 1 x daily - 7 x weekly - 3 sets - 3-5 reps - Semi-Tandem Corner Balance With Eyes Open  - 1 x daily - 7 x weekly - 3 sets - 15-30 sec hold  DIAGNOSTIC FINDINGS:   IMPRESSION: 1. No acute intracranial abnormalities. 2. Chronic microvascular disease and brain atrophy. 3. Remote left cerebellar hemisphere infarct.  COGNITION: Overall cognitive status: Within functional limits for tasks assessed   SENSATION: WFL  COORDINATION: Grossly impaired with alternating movements Heel to shin WFL  EDEMA:    MUSCLE TONE: WNL  MUSCLE LENGTH: NT  DTRs:    POSTURE: forward head  LOWER EXTREMITY ROM:     AROM WFL  LOWER EXTREMITY MMT:    Grossly 4/5 BLE  BED MOBILITY:  indep  TRANSFERS: Assistive device utilized: Environmental consultant - 2 wheeled  Sit to stand: Modified independence Stand to sit: Modified independence Chair to chair: Modified independence Floor:  NT  RAMP:  Level of Assistance: SBA Assistive device utilized: Environmental consultant - 2 wheeled Ramp Comments:   CURB:  Level of Assistance: SBA Assistive device utilized: Environmental consultant - 2 wheeled Curb Comments:   STAIRS: Level of Assistance: SBA Stair Negotiation Technique: Step to Pattern with Bilateral Rails Number of Stairs: 5  Height of Stairs:  4-6"  Comments:   GAIT: Gait pattern: ataxic and wide BOS Distance walked:  Assistive device utilized: Environmental consultant - 2 wheeled Level of assistance: SBA Comments:   FUNCTIONAL TESTS:  5 times sit to stand: 20.43 Timed up and go (TUG): 21 sec w/ RW Berg Balance Scale: 39/56  M-CTSIB  Condition 1: Firm Surface, EO 30 Sec, Normal and Mild Sway  Condition 2: Firm Surface, EC 30 Sec, Mild Sway  Condition 3: Foam Surface, EO 30 Sec, Mild Sway  Condition 4: Foam Surface, EC 18 Sec, Moderate and Severe Sway        GOALS: Goals reviewed with patient? Yes  SHORT TERM GOALS: Target date: .same as LTG    LONG TERM GOALS: Target date: 01/06/2023    Patient will perform HEP with family/caregiver supervision for improved  strength, balance, transfers, and gait  Baseline:  Goal status: IN PROGRESS  2.  Patient will achieve 15 seconds for TUG test to manifest reduced risk for falls Baseline: 21 sec w/ RW Goal status: IN PROGRESS  3.  Demo low risk for falls per score 45/56 Berg Balance Test Baseline: 39/56 Goal status: IN PROGRESS  4.  Improved BLE strength and balance per time of 15 sec 5xSTS to reduce risk for falls Baseline: 20 sec Goal status: IN PROGRESS    ASSESSMENT:  CLINICAL IMPRESSION:  Initiated activities for static/postural stability balance to improve safety with unsupported standing for ADL performance. Continued with strength training to improve coordination/motor control/and activity tolerance with techniques to facilitate isolation. Tolerated session well  OBJECTIVE IMPAIRMENTS: Abnormal gait, decreased activity tolerance, decreased balance, decreased coordination, difficulty walking, decreased strength, and decreased motor control .   ACTIVITY LIMITATIONS: carrying, lifting, stairs, transfers, and locomotion level  PARTICIPATION LIMITATIONS: meal prep, community activity, and activities of interest-walking outdoors  PERSONAL FACTORS: Age, Time since onset of  injury/illness/exacerbation, and 1 comorbidity: HD  are also affecting patient's functional outcome.   REHAB POTENTIAL: Good  CLINICAL DECISION MAKING: Evolving/moderate complexity  EVALUATION COMPLEXITY: Moderate  PLAN:  PT FREQUENCY: 2x/week  PT DURATION: 4 weeks  PLANNED INTERVENTIONS: Therapeutic exercises, Therapeutic activity, Neuromuscular re-education, Balance training, Gait training, Patient/Family education, Self Care, Joint mobilization, Stair training, Vestibular training, Canalith repositioning, Orthotic/Fit training, DME instructions, Aquatic Therapy, Dry Needling, Electrical stimulation, Spinal mobilization, Taping, Ionotophoresis 63m/ml Dexamethasone, and Manual therapy  PLAN FOR NEXT SESSION: corner balance activities, general strengthening   3:36 PM, 12/21/22 M. KSherlyn Lees PT, DPT Physical Therapist- CFarwellOffice Number: [phone]

## 2022-12-23 ENCOUNTER — Ambulatory Visit: Payer: Medicare Other

## 2022-12-23 DIAGNOSIS — R29818 Other symptoms and signs involving the nervous system: Secondary | ICD-10-CM

## 2022-12-23 DIAGNOSIS — R2689 Other abnormalities of gait and mobility: Secondary | ICD-10-CM

## 2022-12-23 DIAGNOSIS — M6281 Muscle weakness (generalized): Secondary | ICD-10-CM

## 2022-12-23 DIAGNOSIS — R2681 Unsteadiness on feet: Secondary | ICD-10-CM | POA: Diagnosis not present

## 2022-12-23 NOTE — Therapy (Signed)
OUTPATIENT PHYSICAL THERAPY NEURO TREATMENT   Patient Name: Barbara Krause MRN: [id number] DOB:1941/06/18, 82 y.o., female Today's Date: 12/23/2022   PCP: Jinny Sanders, MD REFERRING PROVIDER: Jinny Sanders, MD  END OF SESSION:  PT End of Session - 12/23/22 1407     Visit Number 4    Number of Visits 8    Date for PT Re-Evaluation 01/06/23    Authorization Type Medicare/BCBS    Progress Note Due on Visit 10    PT Start Time 1400    PT Stop Time [id number]    PT Time Calculation (min) 45 min    Equipment Utilized During Treatment Gait belt    Activity Tolerance Patient tolerated treatment well    Behavior During Therapy WFL for tasks assessed/performed              Past Medical History:  Diagnosis Date   Breast cancer ([place]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   Depression    History of chicken pox    History of colon polyps    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[place])    Kidney stone    Osteoporosis    Past Surgical History:  Procedure Laterality Date   CESAREAN SECTION     1977 and [place] Bilateral 2009   Patient Active Problem List   Diagnosis Date Noted   Acute metabolic encephalopathy 0000000   Closed fracture of lower end of right radius with routine healing 11/04/2022   Constipation 10/17/2022   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/2022   Oral candidiasis 10/04/2022   Acute cough 10/04/2022   Right thyroid nodule 10/02/2022   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 10/02/2022   Acute CVA (cerebrovascular accident) ([place]) 10/01/2022   Coronary atherosclerosis 08/03/2022   Compression fracture of thoracolumbar vertebra with routine healing 08/03/2022   DNR (do not resuscitate) 08/03/2022   At risk for aspiration 08/03/2022   Dysphagia 08/03/2022   High cholesterol 08/03/2022   Dementia ([place]) 08/03/2022   Acute midline low back pain without sciatica 03/22/2022   Recurrent falls 11/26/2020   Moderate  protein-calorie malnutrition ([place]) 06/30/2020   History of intracranial hemorrhage 06/15/2020   Aortic atherosclerosis ([place]) 08/30/2019   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  Rationale for Evaluation and Treatment: Rehabilitation  SUBJECTIVE:  SUBJECTIVE STATEMENT:  No issues since the fall, denies pain/soreness, lightheadedness   Pt accompanied by:  [name], caregiver  PERTINENT HISTORY:   PAIN:  Are you having pain? No 0/10  PRECAUTIONS: Fall  WEIGHT BEARING RESTRICTIONS: No  FALLS: Has patient fallen in last 6 months? Yes. Number of falls 2  LIVING ENVIRONMENT: Lives with: lives alone Lives in: House/apartment Stairs: Yes: Internal: BR/BA on main floor steps;   and External: yes steps; can reach both Has following equipment at home: Gilford Rile - 2 wheeled  PLOF: Independent and Independent with basic ADLs, some assist for showering  PATIENT GOALS:   OBJECTIVE:   TODAY'S TREATMENT: 12/23/22 Activity Comments  NU-step x 8 min For dynamic warm up and coordination   Kick pass physioball X 2 min for single limb stance/reactive balance  Standing on foam -postural perturbations x 2 min for ankle strategy    Retro-walking 2x25 ft W/ CGA, need for rest period after  Sidestepping 2x25 ft W/  CGA, rest needed after--attempted cog dual task but unable  Foot on step 2x10 ball toss with cog dual task     TODAY'S TREATMENT: 12/21/22 Activity Comments  NU-step resistance levels x 8 min   Corner balance -EO/EC 2x30 sec -head turns 3x -semi-tandem 2x15 sec--attempted EC but too unsteady  LAQ 3x10  5#  Seated march 2x10 5#  Sidestepping x 2 min 5# along counter  Hamstring curls 2x10 5#, cushio btwn cabinet and knees to help form/isolation    PATIENT EDUCATION: Education details: assessment details/findings Person educated: Patient Education method: Explanation Education comprehension: verbalized understanding  HOME EXERCISE PROGRAM: Access Code: [email or web address] URL: [email or web address] Date: 12/21/2022 Prepared by: Sherlyn Lees  Exercises - Standing Balance in Corner with Eyes Closed  - 1 x daily - 7 x weekly - 3 sets - 30 sec hold - Corner Balance Feet Apart: Eyes Closed With Head Turns  - 1 x daily - 7 x weekly - 3 sets - 3-5 reps - Semi-Tandem Corner Balance With Eyes Open  - 1 x daily - 7 x weekly - 3 sets - 15-30 sec hold  DIAGNOSTIC FINDINGS:   IMPRESSION: 1. No acute intracranial abnormalities. 2. Chronic microvascular disease and brain atrophy. 3. Remote left cerebellar hemisphere infarct.  COGNITION: Overall cognitive status: Within functional limits for tasks assessed   SENSATION: WFL  COORDINATION: Grossly impaired with alternating movements Heel to shin WFL  EDEMA:    MUSCLE TONE: WNL  MUSCLE LENGTH: NT  DTRs:    POSTURE: forward head  LOWER EXTREMITY ROM:     AROM WFL  LOWER EXTREMITY MMT:    Grossly 4/5 BLE  BED MOBILITY:  indep  TRANSFERS: Assistive device utilized: Environmental consultant - 2 wheeled  Sit to stand: Modified independence Stand to sit: Modified independence Chair to chair: Modified independence Floor:  NT  RAMP:  Level of Assistance: SBA Assistive device utilized: Environmental consultant - 2 wheeled Ramp Comments:   CURB:   Level of Assistance: SBA Assistive device utilized: Environmental consultant - 2 wheeled Curb Comments:   STAIRS: Level of Assistance: SBA Stair Negotiation Technique: Step to Pattern with Bilateral Rails Number of Stairs: 5  Height of Stairs: 4-6"  Comments:   GAIT: Gait pattern: ataxic and wide BOS Distance walked:  Assistive device utilized: Environmental consultant - 2 wheeled Level of assistance: SBA Comments:   FUNCTIONAL TESTS:  5 times sit to stand: 20.43 Timed up and go (TUG): 21 sec w/ RW Berg Balance Scale: 39/56  M-CTSIB  Condition 1: Firm  Surface, EO 30 Sec, Normal and Mild Sway  Condition 2: Firm Surface, EC 30 Sec, Mild Sway  Condition 3: Foam Surface, EO 30 Sec, Mild Sway  Condition 4: Foam Surface, EC 18 Sec, Moderate and Severe Sway        GOALS: Goals reviewed with patient? Yes  SHORT TERM GOALS: Target date: .same as LTG    LONG TERM GOALS: Target date: 01/06/2023    Patient will perform HEP with family/caregiver supervision for improved strength, balance, transfers, and gait  Baseline:  Goal status: IN PROGRESS  2.  Patient will achieve 15 seconds for TUG test to manifest reduced risk for falls Baseline: 21 sec w/ RW Goal status: IN PROGRESS  3.  Demo low risk for falls per score 45/56 Berg Balance Test Baseline: 39/56 Goal status: IN PROGRESS  4.  Improved BLE strength and balance per time of 15 sec 5xSTS to reduce risk for falls Baseline: 20 sec Goal status: IN PROGRESS    ASSESSMENT:  CLINICAL IMPRESSION: Session with emphasis on balance and righting reactions to reduce risk for falls and improve LE stability and safety with ambulation and ADL.  Good use of ankle strategy employed on compliant surfaces with present ,but delayed strategy needing tactile assist to facilitate. Frequent rest periods needed between dynamic activities due to general fatigue.   OBJECTIVE IMPAIRMENTS: Abnormal gait, decreased activity tolerance, decreased balance, decreased coordination,  difficulty walking, decreased strength, and decreased motor control .   ACTIVITY LIMITATIONS: carrying, lifting, stairs, transfers, and locomotion level  PARTICIPATION LIMITATIONS: meal prep, community activity, and activities of interest-walking outdoors  PERSONAL FACTORS: Age, Time since onset of injury/illness/exacerbation, and 1 comorbidity: HD  are also affecting patient's functional outcome.   REHAB POTENTIAL: Good  CLINICAL DECISION MAKING: Evolving/moderate complexity  EVALUATION COMPLEXITY: Moderate  PLAN:  PT FREQUENCY: 2x/week  PT DURATION: 4 weeks  PLANNED INTERVENTIONS: Therapeutic exercises, Therapeutic activity, Neuromuscular re-education, Balance training, Gait training, Patient/Family education, Self Care, Joint mobilization, Stair training, Vestibular training, Canalith repositioning, Orthotic/Fit training, DME instructions, Aquatic Therapy, Dry Needling, Electrical stimulation, Spinal mobilization, Taping, Ionotophoresis 18m/ml Dexamethasone, and Manual therapy  PLAN FOR NEXT SESSION: corner balance activities, general strengthening   2:08 PM, 12/23/22 M. KSherlyn Lees PT, DPT Physical Therapist- CKimballOffice Number: [phone]

## 2022-12-28 ENCOUNTER — Ambulatory Visit: Payer: Medicare Other

## 2022-12-28 DIAGNOSIS — R2689 Other abnormalities of gait and mobility: Secondary | ICD-10-CM

## 2022-12-28 DIAGNOSIS — M6281 Muscle weakness (generalized): Secondary | ICD-10-CM

## 2022-12-28 DIAGNOSIS — R2681 Unsteadiness on feet: Secondary | ICD-10-CM

## 2022-12-28 DIAGNOSIS — R29818 Other symptoms and signs involving the nervous system: Secondary | ICD-10-CM

## 2022-12-28 DIAGNOSIS — I63541 Cerebral infarction due to unspecified occlusion or stenosis of right cerebellar artery: Secondary | ICD-10-CM

## 2022-12-28 NOTE — Therapy (Signed)
OUTPATIENT PHYSICAL THERAPY NEURO TREATMENT   Patient Name: Barbara Krause MRN: [id number] DOB:[birth year], 82 y.o., female Today's Date: 12/28/2022   PCP: Jinny Sanders, MD REFERRING PROVIDER: Jinny Sanders, MD  END OF SESSION:  PT End of Session - 12/28/22 1310     Visit Number 5    Number of Visits 8    Date for PT Re-Evaluation 01/06/23    Authorization Type Medicare/BCBS    Progress Note Due on Visit 10    PT Start Time 1315    PT Stop Time 1400    PT Time Calculation (min) 45 min    Equipment Utilized During Treatment Gait belt    Activity Tolerance Patient tolerated treatment well    Behavior During Therapy WFL for tasks assessed/performed              Past Medical History:  Diagnosis Date   Breast cancer ([place]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   Depression    History of chicken pox    History of colon polyps    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[place])    Kidney stone    Osteoporosis    Past Surgical History:  Procedure Laterality Date   CESAREAN SECTION     1977 and [place] Bilateral 2009   Patient Active Problem List   Diagnosis Date Noted   Acute metabolic encephalopathy 0000000   Closed fracture of lower end of right radius with routine healing 11/04/2022   Constipation 10/17/2022   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[place]) 10/04/2022   Oral candidiasis 10/04/2022   Acute cough 10/04/2022   Right thyroid nodule 10/02/2022   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 10/02/2022   Acute CVA (cerebrovascular accident) ([place]) 10/01/2022   Coronary atherosclerosis 08/03/2022   Compression fracture of thoracolumbar vertebra with routine healing 08/03/2022   DNR (do not resuscitate) 08/03/2022   At risk for aspiration 08/03/2022   Dysphagia 08/03/2022   High cholesterol 08/03/2022   Dementia ([place]) 08/03/2022   Acute midline low back pain without sciatica 03/22/2022   Recurrent falls 11/26/2020   Moderate  protein-calorie malnutrition ([place]) 06/30/2020   History of intracranial hemorrhage 06/15/2020   Aortic atherosclerosis ([place]) 08/30/2019   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[place]) 08/30/2019   Microscopic hematuria 04/02/2019   Osteoporosis 04/02/2019   Vitamin B12 deficiency 04/02/2019   Thoracic aortic aneurysm (TAA) ([place]) 04/02/2019   HTN (hypertension) 03/05/2019   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   Tobacco use 03/05/2019   Depression, major, single episode, complete remission ([place]) 03/05/2019   Hx of breast cancer 03/05/2019   S/P bilateral mastectomy 03/05/2019    ONSET DATE:   REFERRING DIAG: G10 (ICD-10-CM) - 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[place])  THERAPY DIAG:  Unsteadiness on feet  Other abnormalities of gait and mobility  Muscle weakness (generalized)  Other symptoms and signs involving the nervous system  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HCC)  Rationale for Evaluation and Treatment: Rehabilitation  SUBJECTIVE:  SUBJECTIVE STATEMENT:  No issues to note, no falls, no HA, no dizziness. Feeling fine!   Pt accompanied by:  [name], caregiver  PERTINENT HISTORY:   PAIN:  Are you having pain? No 0/10  PRECAUTIONS: Fall  WEIGHT BEARING RESTRICTIONS: No  FALLS: Has patient fallen in last 6 months? Yes. Number of falls 2  LIVING ENVIRONMENT: Lives with: lives alone Lives in: House/apartment Stairs: Yes: Internal: BR/BA on main floor steps;   and External: yes steps; can reach both Has following equipment at home: Gilford Rile - 2 wheeled  PLOF: Independent and Independent with basic ADLs, some assist for showering  PATIENT GOALS:   OBJECTIVE:   TODAY'S TREATMENT: 12/28/22 Activity Comments  NU-step x 8 min Resistance intervals 1:2  Standing on foam -catch/throw 2x2 min w/ cog  dual task -EC x 60 sec  Kicking physioball x 2 min For reactive balance and SLS  Squat-roll physio ball x 60 sec   Granny toss/catch physioball x 2 min   Fun noodle jousting x 2 min Standing on foam  Walking circular path carrying tray and ball for balance/focus 2x clockwise/counter  LAQ 3x10 3#  Sidestepping x 2 min 3#    TODAY'S TREATMENT: 12/23/22 Activity Comments  NU-step x 8 min For dynamic warm up and coordination   Kick pass physioball X 2 min for single limb stance/reactive balance  Standing on foam -postural perturbations x 2 min for ankle strategy    Retro-walking 2x25 ft W/ CGA, need for rest period after  Sidestepping 2x25 ft W/ CGA, rest needed after--attempted cog dual task but unable  Foot on step 2x10 ball toss with cog dual task        PATIENT EDUCATION: Education details: assessment details/findings Person educated: Patient Education method: Explanation Education comprehension: verbalized understanding  HOME EXERCISE PROGRAM: Access Code: [email or web address] URL: [email or web address] Date: 12/21/2022 Prepared by: Sherlyn Lees  Exercises - Standing Balance in Corner with Eyes Closed  - 1 x daily - 7 x weekly - 3 sets - 30 sec hold - Corner Balance Feet Apart: Eyes Closed With Head Turns  - 1 x daily - 7 x weekly - 3 sets - 3-5 reps - Semi-Tandem Corner Balance With Eyes Open  - 1 x daily - 7 x weekly - 3 sets - 15-30 sec hold  DIAGNOSTIC FINDINGS:   IMPRESSION: 1. No acute intracranial abnormalities. 2. Chronic microvascular disease and brain atrophy. 3. Remote left cerebellar hemisphere infarct.  COGNITION: Overall cognitive status: Within functional limits for tasks assessed   SENSATION: WFL  COORDINATION: Grossly impaired with alternating movements Heel to shin WFL  EDEMA:    MUSCLE TONE: WNL  MUSCLE LENGTH: NT  DTRs:    POSTURE: forward head  LOWER EXTREMITY ROM:     AROM WFL  LOWER EXTREMITY MMT:    Grossly 4/5  BLE  BED MOBILITY:  indep  TRANSFERS: Assistive device utilized: Environmental consultant - 2 wheeled  Sit to stand: Modified independence Stand to sit: Modified independence Chair to chair: Modified independence Floor:  NT  RAMP:  Level of Assistance: SBA Assistive device utilized: Environmental consultant - 2 wheeled Ramp Comments:   CURB:  Level of Assistance: SBA Assistive device utilized: Environmental consultant - 2 wheeled Curb Comments:   STAIRS: Level of Assistance: SBA Stair Negotiation Technique: Step to Pattern with Bilateral Rails Number of Stairs: 5  Height of Stairs: 4-6"  Comments:   GAIT: Gait pattern: ataxic and wide BOS Distance walked:  Assistive device utilized: Environmental consultant - 2 wheeled Level  of assistance: SBA Comments:   FUNCTIONAL TESTS:  5 times sit to stand: 20.43 Timed up and go (TUG): 21 sec w/ RW Berg Balance Scale: 39/56  M-CTSIB  Condition 1: Firm Surface, EO 30 Sec, Normal and Mild Sway  Condition 2: Firm Surface, EC 30 Sec, Mild Sway  Condition 3: Foam Surface, EO 30 Sec, Mild Sway  Condition 4: Foam Surface, EC 18 Sec, Moderate and Severe Sway        GOALS: Goals reviewed with patient? Yes  SHORT TERM GOALS: Target date: .same as LTG    LONG TERM GOALS: Target date: 01/06/2023    Patient will perform HEP with family/caregiver supervision for improved strength, balance, transfers, and gait  Baseline:  Goal status: IN PROGRESS  2.  Patient will achieve 15 seconds for TUG test to manifest reduced risk for falls Baseline: 21 sec w/ RW Goal status: IN PROGRESS  3.  Demo low risk for falls per score 45/56 Berg Balance Test Baseline: 39/56 Goal status: IN PROGRESS  4.  Improved BLE strength and balance per time of 15 sec 5xSTS to reduce risk for falls Baseline: 20 sec Goal status: IN PROGRESS    ASSESSMENT:  CLINICAL IMPRESSION: Continued with balance training to promote and facilitate anticipatory and reactive balance strategies with performance on firm and compliant  surfaces in order to enact various righting reaction strategies.  Delayed, but present hip/stepping strategy with unsteadiness present and legs crossing midline but difficulty in enacting stepping to a functional degree requiring CGA-min A to correct and prevent LOB. Gait training w/ emphasis on negotiating turns and divided attention for carrying item for added challenge. Continued sessions to advance POC details and meet STG/LTG. Improved activity tolernace today only required 3 seated rest periods  OBJECTIVE IMPAIRMENTS: Abnormal gait, decreased activity tolerance, decreased balance, decreased coordination, difficulty walking, decreased strength, and decreased motor control .   ACTIVITY LIMITATIONS: carrying, lifting, stairs, transfers, and locomotion level  PARTICIPATION LIMITATIONS: meal prep, community activity, and activities of interest-walking outdoors  PERSONAL FACTORS: Age, Time since onset of injury/illness/exacerbation, and 1 comorbidity: HD  are also affecting patient's functional outcome.   REHAB POTENTIAL: Good  CLINICAL DECISION MAKING: Evolving/moderate complexity  EVALUATION COMPLEXITY: Moderate  PLAN:  PT FREQUENCY: 2x/week  PT DURATION: 4 weeks  PLANNED INTERVENTIONS: Therapeutic exercises, Therapeutic activity, Neuromuscular re-education, Balance training, Gait training, Patient/Family education, Self Care, Joint mobilization, Stair training, Vestibular training, Canalith repositioning, Orthotic/Fit training, DME instructions, Aquatic Therapy, Dry Needling, Electrical stimulation, Spinal mobilization, Taping, Ionotophoresis '4mg'$ /ml Dexamethasone, and Manual therapy  PLAN FOR NEXT SESSION: corner balance activities, general strengthening   1:11 PM, 12/28/22 M. Sherlyn Lees, PT, DPT Physical Therapist- [place] Office Number: [phone]

## 2022-12-30 ENCOUNTER — Ambulatory Visit: Payer: Medicare Other

## 2022-12-30 DIAGNOSIS — R2681 Unsteadiness on feet: Secondary | ICD-10-CM | POA: Diagnosis not present

## 2022-12-30 DIAGNOSIS — R293 Abnormal posture: Secondary | ICD-10-CM

## 2022-12-30 DIAGNOSIS — R29818 Other symptoms and signs involving the nervous system: Secondary | ICD-10-CM

## 2022-12-30 DIAGNOSIS — R2689 Other abnormalities of gait and mobility: Secondary | ICD-10-CM

## 2022-12-30 DIAGNOSIS — M6281 Muscle weakness (generalized): Secondary | ICD-10-CM

## 2022-12-30 DIAGNOSIS — I63541 Cerebral infarction due to unspecified occlusion or stenosis of right cerebellar artery: Secondary | ICD-10-CM

## 2022-12-30 NOTE — Therapy (Signed)
OUTPATIENT PHYSICAL THERAPY NEURO TREATMENT   Patient Name: Barbara Krause MRN: [id number] DOB:1941-03-30, 82 y.o., female Today's Date: 12/30/2022   PCP: Jinny Sanders, MD REFERRING PROVIDER: Jinny Sanders, MD  END OF SESSION:  PT End of Session - 12/30/22 1410     Visit Number 6    Number of Visits 8    Date for PT Re-Evaluation 01/06/23    Authorization Type Medicare/BCBS    Progress Note Due on Visit 10    PT Start Time 1400    PT Stop Time [id number]    PT Time Calculation (min) 45 min    Equipment Utilized During Treatment Gait belt    Activity Tolerance Patient tolerated treatment well    Behavior During Therapy WFL for tasks assessed/performed              Past Medical History:  Diagnosis Date   Breast cancer ([place]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   Depression    History of chicken pox    History of colon polyps    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[place])    Kidney stone    Osteoporosis    Past Surgical History:  Procedure Laterality Date   CESAREAN SECTION     1977 and [place] Bilateral 2009   Patient Active Problem List   Diagnosis Date Noted   Acute metabolic encephalopathy 0000000   Closed fracture of lower end of right radius with routine healing 11/04/2022   Constipation 10/17/2022   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[place]) 10/04/2022   Oral candidiasis 10/04/2022   Acute cough 10/04/2022   Right thyroid nodule 10/02/2022   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 10/02/2022   Acute CVA (cerebrovascular accident) ([place]) 10/01/2022   Coronary atherosclerosis 08/03/2022   Compression fracture of thoracolumbar vertebra with routine healing 08/03/2022   DNR (do not resuscitate) 08/03/2022   At risk for aspiration 08/03/2022   Dysphagia 08/03/2022   High cholesterol 08/03/2022   Dementia ([place]) 08/03/2022   Acute midline low back pain without sciatica 03/22/2022   Recurrent falls 11/26/2020   Moderate  protein-calorie malnutrition ([place]) 06/30/2020   History of intracranial hemorrhage 06/15/2020   Aortic atherosclerosis ([place]) 08/30/2019   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[place]) 08/30/2019   Microscopic hematuria 04/02/2019   Osteoporosis 04/02/2019   Vitamin B12 deficiency 04/02/2019   Thoracic aortic aneurysm (TAA) ([place]) 04/02/2019   HTN (hypertension) 03/05/2019   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   Tobacco use 03/05/2019   Depression, major, single episode, complete remission ([place]) 03/05/2019   Hx of breast cancer 03/05/2019   S/P bilateral mastectomy 03/05/2019    ONSET DATE:   REFERRING DIAG: G10 (ICD-10-CM) - 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[place])  THERAPY DIAG:  Unsteadiness on feet  Other abnormalities of gait and mobility  Muscle weakness (generalized)  Other symptoms and signs involving the nervous system  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HCC)  Abnormal posture  Rationale for Evaluation and Treatment: Rehabilitation  SUBJECTIVE:  SUBJECTIVE STATEMENT:  Having a nice day.  Been at home watching TV   Pt accompanied by:  [name], caregiver  PERTINENT HISTORY:   PAIN:  Are you having pain? No 0/10  PRECAUTIONS: Fall  WEIGHT BEARING RESTRICTIONS: No  FALLS: Has patient fallen in last 6 months? Yes. Number of falls 2  LIVING ENVIRONMENT: Lives with: lives alone Lives in: House/apartment Stairs: Yes: Internal: BR/BA on main floor steps;   and External: yes steps; can reach both Has following equipment at home: Gilford Rile - 2 wheeled  PLOF: Independent and Independent with basic ADLs, some assist for showering  PATIENT GOALS:   OBJECTIVE:   TODAY'S TREATMENT: 12/29/22 Activity Comments  NU-step x 8 min Resistance intervals 30 sec hard/1 min light  Sit-stand to alt bosu  tap 1x10 4# ankle weights, elevated seat, cues for eccentric lowering--50% success  Sidestepping x 2 min 4#  LAQ 3x10 4#  Standing on foam -repeated turns and matching playing cards  Walking relay -carrying "ice cream" cone with repeated turns   Stand to sit 1x10 3 lbs med ball for BUE hold to eliminate hand use of eccentric lowering    TODAY'S TREATMENT: 12/28/22 Activity Comments  NU-step x 8 min Resistance intervals 1:2  Standing on foam -catch/throw 2x2 min w/ cog dual task -EC x 60 sec  Kicking physioball x 2 min For reactive balance and SLS  Squat-roll physio ball x 60 sec   Granny toss/catch physioball x 2 min   Fun noodle jousting x 2 min Standing on foam  Walking circular path carrying tray and ball for balance/focus 2x clockwise/counter  LAQ 3x10 3#  Sidestepping x 2 min 3#           PATIENT EDUCATION: Education details: assessment details/findings Person educated: Patient Education method: Explanation Education comprehension: verbalized understanding  HOME EXERCISE PROGRAM: Access Code: [email or web address] URL: [email or web address] Date: 12/21/2022 Prepared by: Sherlyn Lees  Exercises - Standing Balance in Corner with Eyes Closed  - 1 x daily - 7 x weekly - 3 sets - 30 sec hold - Corner Balance Feet Apart: Eyes Closed With Head Turns  - 1 x daily - 7 x weekly - 3 sets - 3-5 reps - Semi-Tandem Corner Balance With Eyes Open  - 1 x daily - 7 x weekly - 3 sets - 15-30 sec hold  DIAGNOSTIC FINDINGS:   IMPRESSION: 1. No acute intracranial abnormalities. 2. Chronic microvascular disease and brain atrophy. 3. Remote left cerebellar hemisphere infarct.  COGNITION: Overall cognitive status: Within functional limits for tasks assessed   SENSATION: WFL  COORDINATION: Grossly impaired with alternating movements Heel to shin WFL  EDEMA:    MUSCLE TONE: WNL  MUSCLE LENGTH: NT  DTRs:    POSTURE: forward head  LOWER EXTREMITY ROM:     AROM  WFL  LOWER EXTREMITY MMT:    Grossly 4/5 BLE  BED MOBILITY:  indep  TRANSFERS: Assistive device utilized: Environmental consultant - 2 wheeled  Sit to stand: Modified independence Stand to sit: Modified independence Chair to chair: Modified independence Floor:  NT  RAMP:  Level of Assistance: SBA Assistive device utilized: Environmental consultant - 2 wheeled Ramp Comments:   CURB:  Level of Assistance: SBA Assistive device utilized: Environmental consultant - 2 wheeled Curb Comments:   STAIRS: Level of Assistance: SBA Stair Negotiation Technique: Step to Pattern with Bilateral Rails Number of Stairs: 5  Height of Stairs: 4-6"  Comments:   GAIT: Gait pattern: ataxic and wide BOS Distance walked:  Assistive device  utilized: Environmental consultant - 2 wheeled Level of assistance: SBA Comments:   FUNCTIONAL TESTS:  5 times sit to stand: 20.43 Timed up and go (TUG): 21 sec w/ RW Berg Balance Scale: 39/56  M-CTSIB  Condition 1: Firm Surface, EO 30 Sec, Normal and Mild Sway  Condition 2: Firm Surface, EC 30 Sec, Mild Sway  Condition 3: Foam Surface, EO 30 Sec, Mild Sway  Condition 4: Foam Surface, EC 18 Sec, Moderate and Severe Sway        GOALS: Goals reviewed with patient? Yes  SHORT TERM GOALS: Target date: .same as LTG    LONG TERM GOALS: Target date: 01/06/2023    Patient will perform HEP with family/caregiver supervision for improved strength, balance, transfers, and gait  Baseline:  Goal status: IN PROGRESS  2.  Patient will achieve 15 seconds for TUG test to manifest reduced risk for falls Baseline: 21 sec w/ RW Goal status: IN PROGRESS  3.  Demo low risk for falls per score 45/56 Berg Balance Test Baseline: 39/56 Goal status: IN PROGRESS  4.  Improved BLE strength and balance per time of 15 sec 5xSTS to reduce risk for falls Baseline: 20 sec Goal status: IN PROGRESS    ASSESSMENT:  CLINICAL IMPRESSION: Skilled training in improving unsupported standing and facilitation of position in space  performing repeated turning on compliant surfaces to improve safety with mobility in small confines and combination of activities including visual scanning, processing, and attempts at dual tasking. Gait training to improve safety with turns and short distance ambulation w/out AD. Strengthening to improve motor control and muscular recruitment for stability and emphasis on eccentric control for stand to sit. Continued sessions to progress and refine HEP  OBJECTIVE IMPAIRMENTS: Abnormal gait, decreased activity tolerance, decreased balance, decreased coordination, difficulty walking, decreased strength, and decreased motor control .   ACTIVITY LIMITATIONS: carrying, lifting, stairs, transfers, and locomotion level  PARTICIPATION LIMITATIONS: meal prep, community activity, and activities of interest-walking outdoors  PERSONAL FACTORS: Age, Time since onset of injury/illness/exacerbation, and 1 comorbidity: HD  are also affecting patient's functional outcome.   REHAB POTENTIAL: Good  CLINICAL DECISION MAKING: Evolving/moderate complexity  EVALUATION COMPLEXITY: Moderate  PLAN:  PT FREQUENCY: 2x/week  PT DURATION: 4 weeks  PLANNED INTERVENTIONS: Therapeutic exercises, Therapeutic activity, Neuromuscular re-education, Balance training, Gait training, Patient/Family education, Self Care, Joint mobilization, Stair training, Vestibular training, Canalith repositioning, Orthotic/Fit training, DME instructions, Aquatic Therapy, Dry Needling, Electrical stimulation, Spinal mobilization, Taping, Ionotophoresis '4mg'$ /ml Dexamethasone, and Manual therapy  PLAN FOR NEXT SESSION: cHEP and review/additions  2:11 PM, 12/30/22 M. Sherlyn Lees, PT, DPT Physical Therapist- [place] Office Number: [phone]

## 2023-01-03 NOTE — Therapy (Signed)
OUTPATIENT PHYSICAL THERAPY NEURO TREATMENT   Patient Name: Barbara Krause MRN: [id number] DOB:06/06/1941, 82 y.o., female Today's Date: 01/04/2023   PCP: Jinny Sanders, MD REFERRING PROVIDER: Jinny Sanders, MD  END OF SESSION:  PT End of Session - 01/04/23 1457     Visit Number 7    Number of Visits 8    Date for PT Re-Evaluation 01/06/23    Authorization Type Medicare/BCBS    Progress Note Due on Visit 10    PT Start Time 1315    PT Stop Time 1359    PT Time Calculation (min) 44 min    Equipment Utilized During Treatment Gait belt    Activity Tolerance Patient tolerated treatment well    Behavior During Therapy WFL for tasks assessed/performed               Past Medical History:  Diagnosis Date   Breast cancer ([place]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   Depression    History of chicken pox    History of colon polyps    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[place])    Kidney stone    Osteoporosis    Past Surgical History:  Procedure Laterality Date   CESAREAN SECTION     1977 and [place] Bilateral 2009   Patient Active Problem List   Diagnosis Date Noted   Acute metabolic encephalopathy 0000000   Closed fracture of lower end of right radius with routine healing 11/04/2022   Constipation 10/17/2022   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   Oral candidiasis 10/04/2022   Acute cough 10/04/2022   Right thyroid nodule 10/02/2022   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 10/02/2022   Acute CVA (cerebrovascular accident) ([place]) 10/01/2022   Coronary atherosclerosis 08/03/2022   Compression fracture of thoracolumbar vertebra with routine healing 08/03/2022   DNR (do not resuscitate) 08/03/2022   At risk for aspiration 08/03/2022   Dysphagia 08/03/2022   High cholesterol 08/03/2022   Dementia ([place]) 08/03/2022   Acute midline low back pain without sciatica 03/22/2022   Recurrent falls 11/26/2020   Moderate  protein-calorie malnutrition ([place]) 06/30/2020   History of intracranial hemorrhage 06/15/2020   Aortic atherosclerosis ([place]) 08/30/2019   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[place]) 08/30/2019   Microscopic hematuria 04/02/2019   Osteoporosis 04/02/2019   Vitamin B12 deficiency 04/02/2019   Thoracic aortic aneurysm (TAA) ([place]) 04/02/2019   HTN (hypertension) 03/05/2019   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    Pt accompanied by:  [name], caregiver  PERTINENT HISTORY:   PAIN:  Are you having pain? No 0/10  PRECAUTIONS: Fall  WEIGHT BEARING RESTRICTIONS: No  FALLS: Has patient fallen in last 6 months? Yes. Number of falls 2  LIVING ENVIRONMENT: Lives with: lives alone Lives in: House/apartment Stairs: Yes: Internal: BR/BA on main floor steps;   and External: yes steps; can reach both Has following equipment at home: Gilford Rile - 2 wheeled  PLOF: Independent and Independent with basic ADLs, some assist for showering  PATIENT GOALS:   OBJECTIVE:      TODAY'S TREATMENT: 01/04/23 Activity Comments  Nustep L 4 x 6 min Ues/Les  Maintaining ~50SPM  Standing resisted march 2# 2x20 Lateral trunk lean; cueing to avoid stomping   Alt toe tap on 8" step with 1  handrail 2# 2x20 Catching L foot on step d/t hip weakness   Stair navigation with B handrails and CGA Initial imbalance descending which improved with cueing to hit heel rather than toes down first; required step-to pattern descending   Gait training outside on ramps and focusing on turns With RW and trialed 4WW; pt required cues for safe sequential turn and to prevent 4WW from getting too far out in front  mini squat x10 Manual cues for form  Alt posterior stepping  Able to perform withotu UE support with CGA-minA     HOME EXERCISE PROGRAM: Access Code: [email or web address] URL: [email or web address] Date: 12/21/2022 Prepared by: Sherlyn Lees  Exercises - Standing Balance in [place] with Eyes Closed  - 1 x daily - 7 x weekly - 3 sets - 30 sec hold - Corner Balance Feet Apart: Eyes Closed With Head Turns  - 1 x daily - 7 x weekly - 3 sets - 3-5 reps - Semi-Tandem Corner Balance With Eyes Open  - 1 x daily - 7 x weekly - 3 sets - 15-30 sec hold   Below measures were taken at time of initial evaluation unless otherwise specified:  DIAGNOSTIC FINDINGS:   IMPRESSION: 1. No acute intracranial abnormalities. 2. Chronic microvascular disease and brain atrophy. 3. Remote left cerebellar hemisphere infarct.  COGNITION: Overall cognitive status: Within functional limits for tasks assessed   SENSATION: WFL  COORDINATION: Grossly impaired with alternating movements Heel to shin WFL  EDEMA:    MUSCLE TONE: WNL  MUSCLE LENGTH: NT  DTRs:    POSTURE: forward head  LOWER EXTREMITY ROM:     AROM WFL  LOWER EXTREMITY MMT:    Grossly 4/5 BLE  BED MOBILITY:  indep  TRANSFERS: Assistive device utilized: Environmental consultant - 2 wheeled  Sit to stand: Modified independence Stand to sit: Modified independence Chair to chair: Modified independence Floor:  NT  RAMP:  Level of Assistance: SBA Assistive device utilized: Environmental consultant - 2 wheeled Ramp Comments:   CURB:  Level of  Assistance: SBA Assistive device utilized: Environmental consultant - 2 wheeled Curb Comments:   STAIRS: Level of Assistance: SBA Stair Negotiation Technique: Step to Pattern with Bilateral Rails Number of Stairs: 5  Height of Stairs: 4-6"  Comments:   GAIT: Gait pattern: ataxic and wide BOS Distance walked:  Assistive device utilized: Environmental consultant - 2 wheeled Level of assistance: SBA Comments:   FUNCTIONAL TESTS:  5 times sit to stand: 20.43 Timed up and go (TUG): 21 sec w/ RW Berg Balance Scale: 39/56  M-CTSIB  Condition 1: Firm Surface, EO 30 Sec, Normal and Mild Sway  Condition 2: Firm Surface, EC 30 Sec, Mild Sway  Condition 3: Foam Surface, EO 30 Sec, Mild Sway  Condition 4: Foam Surface, EC 18 Sec, Moderate and Severe Sway        GOALS: Goals reviewed with patient? Yes  SHORT TERM GOALS: Target date: .same as LTG    LONG TERM GOALS: Target date: 01/06/2023    Patient will perform HEP with family/caregiver supervision for improved strength, balance, transfers, and gait  Baseline:  Goal status: IN PROGRESS  2.  Patient will achieve 15 seconds for TUG test to manifest reduced risk for falls Baseline: 21 sec w/ RW Goal status: IN PROGRESS  3.  Demo low risk for falls per score 45/56 Berg Balance Test Baseline: 39/56 Goal status: IN PROGRESS  4.  Improved BLE strength and balance per time of 15 sec 5xSTS to reduce risk for falls Baseline: 20 sec Goal status: IN PROGRESS    ASSESSMENT:  CLINICAL IMPRESSION: Patient arrived to session without complaints. Patient performed progressive hip strengthening and balance activities. Evident L hip weakness with these activities, causing L foot catching on step. Stair navigation revealed some imbalance descending but improved with continued practice. Patient required cues to step with heel down first. Caregiver reports some imbalance when walking up ramp from pt's front porch, thus worked on ramp training outside. Trialed use of 4WW for  improved ease with turns,/ramps however patient with more difficulty controlling navigation with this device. Plan to continue practicing with RW. Patient tolerated session well and without complaints upon leaving.   OBJECTIVE IMPAIRMENTS: Abnormal gait, decreased activity tolerance, decreased balance, decreased coordination, difficulty walking, decreased strength, and decreased motor control .   ACTIVITY LIMITATIONS: carrying, lifting, stairs, transfers, and locomotion level  PARTICIPATION LIMITATIONS: meal prep, community activity, and activities of interest-walking outdoors  PERSONAL FACTORS: Age, Time since onset of injury/illness/exacerbation, and 1 comorbidity: HD  are also affecting patient's functional outcome.   REHAB POTENTIAL: Good  CLINICAL DECISION MAKING: Evolving/moderate complexity  EVALUATION COMPLEXITY: Moderate  PLAN:  PT FREQUENCY: 2x/week  PT DURATION: 4 weeks  PLANNED INTERVENTIONS: Therapeutic exercises, Therapeutic activity, Neuromuscular re-education, Balance training, Gait training, Patient/Family education, Self Care, Joint mobilization, Stair training, Vestibular training, Canalith repositioning, Orthotic/Fit training, DME instructions, Aquatic Therapy, Dry Needling, Electrical stimulation, Spinal mobilization, Taping, Ionotophoresis '4mg'$ /ml Dexamethasone, and Manual therapy  PLAN FOR NEXT SESSION: recert vs DC; HEP and review/additions    Janene Harvey, PT, DPT 01/04/23 2:59 PM  [facility] at [facility] [address] [place], [place] [address] Phone # [phone] Fax # [phone]

## 2023-01-04 ENCOUNTER — Encounter: Payer: Self-pay | Admitting: Physical Therapy

## 2023-01-04 ENCOUNTER — Ambulatory Visit: Payer: Medicare Other | Attending: Family Medicine | Admitting: Physical Therapy

## 2023-01-04 DIAGNOSIS — R2681 Unsteadiness on feet: Secondary | ICD-10-CM | POA: Insufficient documentation

## 2023-01-04 DIAGNOSIS — R2689 Other abnormalities of gait and mobility: Secondary | ICD-10-CM | POA: Insufficient documentation

## 2023-01-04 DIAGNOSIS — R293 Abnormal posture: Secondary | ICD-10-CM | POA: Diagnosis present

## 2023-01-04 DIAGNOSIS — R29818 Other symptoms and signs involving the nervous system: Secondary | ICD-10-CM | POA: Insufficient documentation

## 2023-01-04 DIAGNOSIS — M6281 Muscle weakness (generalized): Secondary | ICD-10-CM | POA: Diagnosis present

## 2023-01-04 DIAGNOSIS — I63541 Cerebral infarction due to unspecified occlusion or stenosis of right cerebellar artery: Secondary | ICD-10-CM | POA: Insufficient documentation

## 2023-01-06 ENCOUNTER — Ambulatory Visit: Payer: Medicare Other

## 2023-01-06 DIAGNOSIS — I63541 Cerebral infarction due to unspecified occlusion or stenosis of right cerebellar artery: Secondary | ICD-10-CM

## 2023-01-06 DIAGNOSIS — R29818 Other symptoms and signs involving the nervous system: Secondary | ICD-10-CM

## 2023-01-06 DIAGNOSIS — R2681 Unsteadiness on feet: Secondary | ICD-10-CM | POA: Diagnosis not present

## 2023-01-06 DIAGNOSIS — R2689 Other abnormalities of gait and mobility: Secondary | ICD-10-CM

## 2023-01-06 DIAGNOSIS — M6281 Muscle weakness (generalized): Secondary | ICD-10-CM

## 2023-01-06 DIAGNOSIS — R293 Abnormal posture: Secondary | ICD-10-CM

## 2023-01-06 NOTE — Therapy (Signed)
OUTPATIENT PHYSICAL THERAPY NEURO TREATMENT, Progress Note, and Recertification   Patient Name: Barbara Krause MRN: [id number] DOB:12-Sep-1941, 82 y.o., female Today's Date: 01/06/2023   PCP: Jinny Sanders, MD REFERRING PROVIDER: Jinny Sanders, MD  Progress Note Reporting Period 12/09/22 to 01/06/23  See note below for Objective Data and Assessment of Progress/Goals.     END OF SESSION:  PT End of Session - 01/06/23 1400     Visit Number 8    Number of Visits 16    Date for PT Re-Evaluation 02/03/23    Authorization Type Medicare/BCBS    Progress Note Due on Visit 18    PT Start Time 1400    PT Stop Time 1445    PT Time Calculation (min) 45 min    Equipment Utilized During Treatment Gait belt    Activity Tolerance Patient tolerated treatment well    Behavior During Therapy WFL for tasks assessed/performed               Past Medical History:  Diagnosis Date   Breast cancer ([place]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   Depression    History of chicken pox    History of colon polyps    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 10/02/2022   Acute CVA (cerebrovascular accident) ([place]) 10/01/2022   Coronary atherosclerosis 08/03/2022   Compression fracture of thoracolumbar vertebra with routine healing 08/03/2022   DNR (do not resuscitate) 08/03/2022   At risk for aspiration 08/03/2022   Dysphagia 08/03/2022    High cholesterol 08/03/2022   Dementia ([place]) 08/03/2022   Acute midline low back pain without sciatica 03/22/2022   Recurrent falls 11/26/2020   Moderate protein-calorie malnutrition ([place]) 06/30/2020   History of intracranial hemorrhage 06/15/2020   Aortic atherosclerosis ([place]) 08/30/2019   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[place]) 08/30/2019   Microscopic hematuria 04/02/2019   Osteoporosis 04/02/2019   Vitamin B12 deficiency 04/02/2019   Thoracic aortic aneurysm (TAA) ([place]) 04/02/2019   HTN (hypertension) 03/05/2019   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   Tobacco use 03/05/2019   Depression, major, single episode, complete remission ([place]) 03/05/2019   Hx of breast cancer 03/05/2019   S/P bilateral mastectomy 03/05/2019    ONSET DATE:   REFERRING DIAG: G10 (ICD-10-CM) - 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[place])  THERAPY DIAG:  Unsteadiness on feet  Other abnormalities of gait and mobility  Muscle weakness (generalized)  Other symptoms and signs involving the nervous system  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[facility])  Abnormal posture  Rationale for Evaluation and Treatment: Rehabilitation  SUBJECTIVE:  SUBJECTIVE STATEMENT:  Not much going on, just waiting to come in today.    Pt accompanied by:  [name], caregiver  PERTINENT HISTORY:   PAIN:  Are you having pain? No 0/10  PRECAUTIONS: Fall  WEIGHT BEARING RESTRICTIONS: No  FALLS: Has patient fallen in last 6 months? Yes. Number of falls 2  LIVING ENVIRONMENT: Lives with: lives alone Lives in: House/apartment Stairs: Yes: Internal: BR/BA on main floor steps;   and External: yes steps; can reach both Has following equipment at home: Gilford Rile - 2 wheeled  PLOF: Independent and Independent with basic ADLs, some assist for  showering  PATIENT GOALS:   OBJECTIVE:    TODAY'S TREATMENT: 01/06/23 Activity Comments  5xSTS  UE support required: 20 sec  TUG test:  20 sec w/ RW  Berg Balance Test 46/56  Tall kneeling with hip hinge 2x10 Pt did not like this  Hooklying clam shell 3x10 Doubled green loop  Bridges with loop 3x10 Doubled green  Corner balance HEP      TODAY'S TREATMENT: 01/04/23 Activity Comments  Nustep L 4 x 6 min Ues/Les  Maintaining ~50SPM  Standing resisted march 2# 2x20 Lateral trunk lean; cueing to avoid stomping   Alt toe tap on 8" step with 1 handrail 2# 2x20 Catching L foot on step d/t hip weakness   Stair navigation with B handrails and CGA Initial imbalance descending which improved with cueing to hit heel rather than toes down first; required step-to pattern descending   Gait training outside on ramps and focusing on turns With RW and trialed 4WW; pt required cues for safe sequential turn and to prevent 4WW from getting too far out in front  mini squat x10 Manual cues for form  Alt posterior stepping  Able to perform withotu UE support with CGA-minA     HOME EXERCISE PROGRAM: Access Code: [email or web address] URL: [email or web address] Date: 12/21/2022 Prepared by: Sherlyn Lees  Exercises - Standing Balance in [place] with Eyes Closed  - 1 x daily - 7 x weekly - 3 sets - 30 sec hold - Corner Balance Feet Apart: Eyes Closed With Head Turns  - 1 x daily - 7 x weekly - 3 sets - 3-5 reps - Semi-Tandem Corner Balance With Eyes Open  - 1 x daily - 7 x weekly - 3 sets - 15-30 sec hold - Hooklying Clamshell with Resistance  - 1 x daily - 7 x weekly - 3 sets - 10 reps - Supine Bridge with Resistance Band  - 1 x daily - 7 x weekly - 3 sets - 10 reps  Below measures were taken at time of initial evaluation unless otherwise specified:  DIAGNOSTIC FINDINGS:   IMPRESSION: 1. No acute intracranial abnormalities. 2. Chronic microvascular disease and brain atrophy. 3. Remote left  cerebellar hemisphere infarct.  COGNITION: Overall cognitive status: Within functional limits for tasks assessed   SENSATION: WFL  COORDINATION: Grossly impaired with alternating movements Heel to shin WFL  EDEMA:    MUSCLE TONE: WNL  MUSCLE LENGTH: NT  DTRs:    POSTURE: forward head  LOWER EXTREMITY ROM:     AROM WFL  LOWER EXTREMITY MMT:    Grossly 4/5 BLE  BED MOBILITY:  indep  TRANSFERS: Assistive device utilized: Environmental consultant - 2 wheeled  Sit to stand: Modified independence Stand to sit: Modified independence Chair to chair: Modified independence Floor:  NT  RAMP:  Level of Assistance: SBA Assistive device utilized: Environmental consultant - 2 wheeled Ramp Comments:   CURB:  Level of Assistance: SBA Assistive device utilized: Environmental consultant - 2 wheeled Curb Comments:   STAIRS: Level of Assistance: SBA Stair Negotiation Technique: Step to Pattern with Bilateral Rails Number of Stairs: 5  Height of Stairs: 4-6"  Comments:   GAIT: Gait pattern: ataxic and wide BOS Distance walked:  Assistive device utilized: Environmental consultant - 2 wheeled Level of assistance: SBA Comments:   FUNCTIONAL TESTS:  5 times sit to stand: 20.43 Timed up and go (TUG): 21 sec w/ RW Berg Balance Scale: 39/56  M-CTSIB  Condition 1: Firm Surface, EO 30 Sec, Normal and Mild Sway  Condition 2: Firm Surface, EC 30 Sec, Mild Sway  Condition 3: Foam Surface, EO 30 Sec, Mild Sway  Condition 4: Foam Surface, EC 18 Sec, Moderate and Severe Sway        GOALS: Goals reviewed with patient? Yes  SHORT TERM GOALS: Target date: .same as LTG    LONG TERM GOALS: Target date: 01/06/2023    Patient will perform HEP with family/caregiver supervision for improved strength, balance, transfers, and gait  Baseline:  Goal status: MET  2.  Patient will achieve 15 seconds for TUG test to manifest reduced risk for falls Baseline: 21 sec w/ RW; (01/06/23) 20 sec w/ RW Goal status: IN PROGRESS  3.  Demo low risk for  falls per score 45/56 Berg Balance Test Baseline: 39/56; (01/06/23) 46/56 Goal status: MET  4.  Improved BLE strength and balance per time of 15 sec 5xSTS to reduce risk for falls Baseline: 20 sec; (01/06/23) 20 sec w/ UE assist Goal status: IN PROGRESS    ASSESSMENT:  CLINICAL IMPRESSION: Performance of progress note and recertification today addressing LTG with pt demonstrating progress with Berg Balance Test from initial high risk for falls of 39/56 to now 46/56 indicating lower risk for falls.  Continues to lack strength proximally > distally as evident by inability to arise from chair with BUE support and limited control with lowering (eccentric) control and overall limitations of ataxia.  Pt would benefit from continued services to address proximal weakness and advance safe home-based exercises and activities with caregivers to promote carryover and strategies to reduce risk for falls and sequelae of neurological disease.   OBJECTIVE IMPAIRMENTS: Abnormal gait, decreased activity tolerance, decreased balance, decreased coordination, difficulty walking, decreased strength, and decreased motor control .   ACTIVITY LIMITATIONS: carrying, lifting, stairs, transfers, and locomotion level  PARTICIPATION LIMITATIONS: meal prep, community activity, and activities of interest-walking outdoors  PERSONAL FACTORS: Age, Time since onset of injury/illness/exacerbation, and 1 comorbidity: HD  are also affecting patient's functional outcome.   REHAB POTENTIAL: Good  CLINICAL DECISION MAKING: Evolving/moderate complexity  EVALUATION COMPLEXITY: Moderate  PLAN:  PT FREQUENCY: 2x/week  PT DURATION: 4 weeks  PLANNED INTERVENTIONS: Therapeutic exercises, Therapeutic activity, Neuromuscular re-education, Balance training, Gait training, Patient/Family education, Self Care, Joint mobilization, Stair training, Vestibular training, Canalith repositioning, Orthotic/Fit training, DME instructions, Aquatic  Therapy, Dry Needling, Electrical stimulation, Spinal mobilization, Taping, Ionotophoresis '4mg'$ /ml Dexamethasone, and Manual therapy  PLAN FOR NEXT SESSION: HEP and review/additions. Assisted eccentrics for stand to sit   2:52 PM, 01/06/23 M. Sherlyn Lees, PT, DPT Physical Therapist- [place] Office Number: [phone]

## 2023-01-11 ENCOUNTER — Ambulatory Visit: Payer: Medicare Other

## 2023-01-11 DIAGNOSIS — R293 Abnormal posture: Secondary | ICD-10-CM

## 2023-01-11 DIAGNOSIS — R2689 Other abnormalities of gait and mobility: Secondary | ICD-10-CM

## 2023-01-11 DIAGNOSIS — R2681 Unsteadiness on feet: Secondary | ICD-10-CM | POA: Diagnosis not present

## 2023-01-11 DIAGNOSIS — R29818 Other symptoms and signs involving the nervous system: Secondary | ICD-10-CM

## 2023-01-11 DIAGNOSIS — M6281 Muscle weakness (generalized): Secondary | ICD-10-CM

## 2023-01-11 NOTE — Therapy (Signed)
OUTPATIENT PHYSICAL THERAPY NEURO TREATMENT   Patient Name: Barbara Krause MRN: [id number] DOB:Jan 25, 1941, 82 y.o., female Today's Date: 01/11/2023   PCP: Jinny Sanders, MD REFERRING PROVIDER: Jinny Sanders, MD    END OF SESSION:  PT End of Session - 01/11/23 1358     Visit Number 9    Number of Visits 16    Date for PT Re-Evaluation 02/03/23    Authorization Type Medicare/BCBS    Progress Note Due on Visit 18    PT Start Time 1400    PT Stop Time [id number]    PT Time Calculation (min) 45 min    Equipment Utilized During Treatment Gait belt    Activity Tolerance Patient tolerated treatment well    Behavior During Therapy WFL for tasks assessed/performed               Past Medical History:  Diagnosis Date   Breast cancer ([place]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   Depression    History of chicken pox    History of colon polyps    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[place])    Kidney stone    Osteoporosis    Past Surgical History:  Procedure Laterality Date   CESAREAN SECTION     1977 and [place] Bilateral 2009   Patient Active Problem List   Diagnosis Date Noted   Acute metabolic encephalopathy 0000000   Closed fracture of lower end of right radius with routine healing 11/04/2022   Constipation 10/17/2022   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[place]) 10/04/2022   Oral candidiasis 10/04/2022   Acute cough 10/04/2022   Right thyroid nodule 10/02/2022   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 10/02/2022   Acute CVA (cerebrovascular accident) ([place]) 10/01/2022   Coronary atherosclerosis 08/03/2022   Compression fracture of thoracolumbar vertebra with routine healing 08/03/2022   DNR (do not resuscitate) 08/03/2022   At risk for aspiration 08/03/2022   Dysphagia 08/03/2022   High cholesterol 08/03/2022   Dementia ([place]) 08/03/2022   Acute midline low back pain without sciatica 03/22/2022   Recurrent falls 11/26/2020   Moderate  protein-calorie malnutrition ([place]) 06/30/2020   History of intracranial hemorrhage 06/15/2020   Aortic atherosclerosis ([place]) 08/30/2019   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[place]) 08/30/2019   Microscopic hematuria 04/02/2019   Osteoporosis 04/02/2019   Vitamin B12 deficiency 04/02/2019   Thoracic aortic aneurysm (TAA) ([employer]) 04/02/2019   HTN (hypertension) 03/05/2019   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   Tobacco use 03/05/2019   Depression, major, single episode, complete remission ([place]) 03/05/2019   Hx of breast cancer 03/05/2019   S/P bilateral mastectomy 03/05/2019    ONSET DATE:   REFERRING DIAG: G10 (ICD-10-CM) - 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[place])  THERAPY DIAG:  Unsteadiness on feet  Other abnormalities of gait and mobility  Muscle weakness (generalized)  Other symptoms and signs involving the nervous system  Abnormal posture  Rationale for Evaluation and Treatment: Rehabilitation  SUBJECTIVE:  SUBJECTIVE STATEMENT:  Hanging outside   Pt accompanied by:  [name], caregiver  PERTINENT HISTORY:   PAIN:  Are you having pain? No 0/10  PRECAUTIONS: Fall  WEIGHT BEARING RESTRICTIONS: No  FALLS: Has patient fallen in last 6 months? Yes. Number of falls 2  LIVING ENVIRONMENT: Lives with: lives alone Lives in: House/apartment Stairs: Yes: Internal: BR/BA on main floor steps;   and External: yes steps; can reach both Has following equipment at home: Gilford Rile - 2 wheeled  PLOF: Independent and Independent with basic ADLs, some assist for showering  PATIENT GOALS:   OBJECTIVE:   TODAY'S TREATMENT: 01/11/23 Activity Comments  Stand to sit eccentric 3x5 10# goblet hold, 22" seat height  Farmer's carry 6x25 ft 10# unilat carry  Tandem walk x 10 ft w/ unilat carry. Tandem walk x 10 ft w/out added weight    Resisted walking -pushing loaded w/c for external resistance 4x45 ft -retro-forward against 15# emphasis on eccentric control             HOME EXERCISE PROGRAM: Access Code: [email or web address] URL: [email or web address] Date: 12/21/2022 Prepared by: Sherlyn Lees  Exercises - Standing Balance in [place] with Eyes Closed  - 1 x daily - 7 x weekly - 3 sets - 30 sec hold - Corner Balance Feet Apart: Eyes Closed With Head Turns  - 1 x daily - 7 x weekly - 3 sets - 3-5 reps - Semi-Tandem Corner Balance With Eyes Open  - 1 x daily - 7 x weekly - 3 sets - 15-30 sec hold - Hooklying Clamshell with Resistance  - 1 x daily - 7 x weekly - 3 sets - 10 reps - Supine Bridge with Resistance Band  - 1 x daily - 7 x weekly - 3 sets - 10 reps  Below measures were taken at time of initial evaluation unless otherwise specified:  DIAGNOSTIC FINDINGS:   IMPRESSION: 1. No acute intracranial abnormalities. 2. Chronic microvascular disease and brain atrophy. 3. Remote left cerebellar hemisphere infarct.  COGNITION: Overall cognitive status: Within functional limits for tasks assessed   SENSATION: WFL  COORDINATION: Grossly impaired with alternating movements Heel to shin WFL  EDEMA:    MUSCLE TONE: WNL  MUSCLE LENGTH: NT  DTRs:    POSTURE: forward head  LOWER EXTREMITY ROM:     AROM WFL  LOWER EXTREMITY MMT:    Grossly 4/5 BLE  BED MOBILITY:  indep  TRANSFERS: Assistive device utilized: Environmental consultant - 2 wheeled  Sit to stand: Modified independence Stand to sit: Modified independence Chair to chair: Modified independence Floor:  NT  RAMP:  Level of Assistance: SBA Assistive device utilized: Environmental consultant - 2 wheeled Ramp Comments:   CURB:  Level of Assistance: SBA Assistive device utilized: Environmental consultant - 2 wheeled Curb Comments:   STAIRS: Level of Assistance: SBA Stair Negotiation Technique: Step to Pattern with Bilateral Rails Number of Stairs: 5  Height of Stairs:  4-6"  Comments:   GAIT: Gait pattern: ataxic and wide BOS Distance walked:  Assistive device utilized: Environmental consultant - 2 wheeled Level of assistance: SBA Comments:   FUNCTIONAL TESTS:  5 times sit to stand: 20.43 Timed up and go (TUG): 21 sec w/ RW Berg Balance Scale: 39/56  M-CTSIB  Condition 1: Firm Surface, EO 30 Sec, Normal and Mild Sway  Condition 2: Firm Surface, EC 30 Sec, Mild Sway  Condition 3: Foam Surface, EO 30 Sec, Mild Sway  Condition 4: Foam Surface, EC 18 Sec, Moderate and Severe Sway  GOALS: Goals reviewed with patient? Yes  SHORT TERM GOALS: Target date: .same as LTG    LONG TERM GOALS: Target date: 01/06/2023    Patient will perform HEP with family/caregiver supervision for improved strength, balance, transfers, and gait  Baseline:  Goal status: MET  2.  Patient will achieve 15 seconds for TUG test to manifest reduced risk for falls Baseline: 21 sec w/ RW; (01/06/23) 20 sec w/ RW Goal status: IN PROGRESS  3.  Demo low risk for falls per score 45/56 Berg Balance Test Baseline: 39/56; (01/06/23) 46/56 Goal status: MET  4.  Improved BLE strength and balance per time of 15 sec 5xSTS to reduce risk for falls Baseline: 20 sec; (01/06/23) 20 sec w/ UE assist Goal status: IN PROGRESS    ASSESSMENT:  CLINICAL IMPRESSION: Treatment focus on deliberate eccentric control to improve safety and fluidity of mobility and emphasis on managing eccentric loading in gait to improve safety with negotiation of descending stairs/ramps/hills by means of withstanding external load through various means and to good effect.  For instance, tandem walk was improved in control with unilateral carry vs without. Continued sessions to advance POC details  OBJECTIVE IMPAIRMENTS: Abnormal gait, decreased activity tolerance, decreased balance, decreased coordination, difficulty walking, decreased strength, and decreased motor control .   ACTIVITY LIMITATIONS: carrying, lifting,  stairs, transfers, and locomotion level  PARTICIPATION LIMITATIONS: meal prep, community activity, and activities of interest-walking outdoors  PERSONAL FACTORS: Age, Time since onset of injury/illness/exacerbation, and 1 comorbidity: HD  are also affecting patient's functional outcome.   REHAB POTENTIAL: Good  CLINICAL DECISION MAKING: Evolving/moderate complexity  EVALUATION COMPLEXITY: Moderate  PLAN:  PT FREQUENCY: 2x/week  PT DURATION: 4 weeks  PLANNED INTERVENTIONS: Therapeutic exercises, Therapeutic activity, Neuromuscular re-education, Balance training, Gait training, Patient/Family education, Self Care, Joint mobilization, Stair training, Vestibular training, Canalith repositioning, Orthotic/Fit training, DME instructions, Aquatic Therapy, Dry Needling, Electrical stimulation, Spinal mobilization, Taping, Ionotophoresis '4mg'$ /ml Dexamethasone, and Manual therapy  PLAN FOR NEXT SESSION: HEP and review/additions. Assisted eccentrics for stand to sit   1:59 PM, 01/11/23 M. Sherlyn Lees, PT, DPT Physical Therapist- [facility] Office Number: [phone]

## 2023-01-13 ENCOUNTER — Ambulatory Visit: Payer: Medicare Other

## 2023-01-13 DIAGNOSIS — R2689 Other abnormalities of gait and mobility: Secondary | ICD-10-CM

## 2023-01-13 DIAGNOSIS — I63541 Cerebral infarction due to unspecified occlusion or stenosis of right cerebellar artery: Secondary | ICD-10-CM

## 2023-01-13 DIAGNOSIS — R2681 Unsteadiness on feet: Secondary | ICD-10-CM

## 2023-01-13 DIAGNOSIS — R293 Abnormal posture: Secondary | ICD-10-CM

## 2023-01-13 DIAGNOSIS — M6281 Muscle weakness (generalized): Secondary | ICD-10-CM

## 2023-01-13 DIAGNOSIS — R29818 Other symptoms and signs involving the nervous system: Secondary | ICD-10-CM

## 2023-01-13 NOTE — Therapy (Signed)
OUTPATIENT PHYSICAL THERAPY NEURO TREATMENT   Patient Name: Barbara Krause MRN: [id number] DOB:Mar 31, 1941, 82 y.o., female Today's Date: 01/13/2023   PCP: Jinny Sanders, MD REFERRING PROVIDER: Jinny Sanders, MD    END OF SESSION:  PT End of Session - 01/13/23 1537     Visit Number 10    Number of Visits 16    Date for PT Re-Evaluation 02/03/23    Authorization Type Medicare/BCBS    Progress Note Due on Visit 18    PT Start Time 1530    PT Stop Time [id number]    PT Time Calculation (min) 45 min    Equipment Utilized During Treatment Gait belt    Activity Tolerance Patient tolerated treatment well    Behavior During Therapy WFL for tasks assessed/performed               Past Medical History:  Diagnosis Date   Breast cancer ([place]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   Depression    History of chicken pox    History of colon polyps    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[place])    Kidney stone    Osteoporosis    Past Surgical History:  Procedure Laterality Date   CESAREAN SECTION     1977 and [place] Bilateral 2009   Patient Active Problem List   Diagnosis Date Noted   Acute metabolic encephalopathy 0000000   Closed fracture of lower end of right radius with routine healing 11/04/2022   Constipation 10/17/2022   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[place]) 10/04/2022   Oral candidiasis 10/04/2022   Acute cough 10/04/2022   Right thyroid nodule 10/02/2022   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 10/02/2022   Acute CVA (cerebrovascular accident) ([place]) 10/01/2022   Coronary atherosclerosis 08/03/2022   Compression fracture of thoracolumbar vertebra with routine healing 08/03/2022   DNR (do not resuscitate) 08/03/2022   At risk for aspiration 08/03/2022   Dysphagia 08/03/2022   High cholesterol 08/03/2022   Dementia ([place]) 08/03/2022   Acute midline low back pain without sciatica 03/22/2022   Recurrent falls 11/26/2020   Moderate  protein-calorie malnutrition ([place]) 06/30/2020   History of intracranial hemorrhage 06/15/2020   Aortic atherosclerosis ([place]) 08/30/2019   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[place]) 08/30/2019   Microscopic hematuria 04/02/2019   Osteoporosis 04/02/2019   Vitamin B12 deficiency 04/02/2019   Thoracic aortic aneurysm (TAA) ([place]) 04/02/2019   HTN (hypertension) 03/05/2019   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   Tobacco use 03/05/2019   Depression, major, single episode, complete remission ([place]) 03/05/2019   Hx of breast cancer 03/05/2019   S/P bilateral mastectomy 03/05/2019    ONSET DATE:   REFERRING DIAG: G10 (ICD-10-CM) - 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[place])  THERAPY DIAG:  Unsteadiness on feet  Other abnormalities of gait and mobility  Muscle weakness (generalized)  Other symptoms and signs involving the nervous system  Abnormal posture  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[facility])  Rationale for Evaluation and Treatment: Rehabilitation  SUBJECTIVE:  SUBJECTIVE STATEMENT: Having a nice day   Pt accompanied by:  [name], caregiver  PERTINENT HISTORY:   PAIN:  Are you having pain? No 0/10  PRECAUTIONS: Fall  WEIGHT BEARING RESTRICTIONS: No  FALLS: Has patient fallen in last 6 months? Yes. Number of falls 2  LIVING ENVIRONMENT: Lives with: lives alone Lives in: House/apartment Stairs: Yes: Internal: BR/BA on main floor steps;   and External: yes steps; can reach both Has following equipment at home: Gilford Rile - 2 wheeled  PLOF: Independent and Independent with basic ADLs, some assist for showering  PATIENT GOALS:   OBJECTIVE:   TODAY'S TREATMENT: 01/13/23 Activity Comments  NU-step resistance intervals x 6 min 30 sec on/off To improve force and alternating speeds  Standing on uneven  ground -bocce ball: underhand toss med ball for force production and withstand postural perturbation -kicking physioball 2x10  -overhead throw physioball 1x10  Seated LE PRE 3x10 -LAQ 4# -hip add iso -hamstring curls  red t-band -clamshells red loop               HOME EXERCISE PROGRAM: Access Code: [email or web address] URL: [email or web address] Date: 12/21/2022 Prepared by: Sherlyn Lees  Exercises - Standing Balance in [place] with Eyes Closed  - 1 x daily - 7 x weekly - 3 sets - 30 sec hold - Corner Balance Feet Apart: Eyes Closed With Head Turns  - 1 x daily - 7 x weekly - 3 sets - 3-5 reps - Semi-Tandem Corner Balance With Eyes Open  - 1 x daily - 7 x weekly - 3 sets - 15-30 sec hold - Hooklying Clamshell with Resistance  - 1 x daily - 7 x weekly - 3 sets - 10 reps - Supine Bridge with Resistance Band  - 1 x daily - 7 x weekly - 3 sets - 10 reps  Below measures were taken at time of initial evaluation unless otherwise specified:  DIAGNOSTIC FINDINGS:   IMPRESSION: 1. No acute intracranial abnormalities. 2. Chronic microvascular disease and brain atrophy. 3. Remote left cerebellar hemisphere infarct.  COGNITION: Overall cognitive status: Within functional limits for tasks assessed   SENSATION: WFL  COORDINATION: Grossly impaired with alternating movements Heel to shin WFL  EDEMA:    MUSCLE TONE: WNL  MUSCLE LENGTH: NT  DTRs:    POSTURE: forward head  LOWER EXTREMITY ROM:     AROM WFL  LOWER EXTREMITY MMT:    Grossly 4/5 BLE  BED MOBILITY:  indep  TRANSFERS: Assistive device utilized: Environmental consultant - 2 wheeled  Sit to stand: Modified independence Stand to sit: Modified independence Chair to chair: Modified independence Floor:  NT  RAMP:  Level of Assistance: SBA Assistive device utilized: Environmental consultant - 2 wheeled Ramp Comments:   CURB:  Level of Assistance: SBA Assistive device utilized: Environmental consultant - 2 wheeled Curb Comments:   STAIRS: Level of  Assistance: SBA Stair Negotiation Technique: Step to Pattern with Bilateral Rails Number of Stairs: 5  Height of Stairs: 4-6"  Comments:   GAIT: Gait pattern: ataxic and wide BOS Distance walked:  Assistive device utilized: Environmental consultant - 2 wheeled Level of assistance: SBA Comments:   FUNCTIONAL TESTS:  5 times sit to stand: 20.43 Timed up and go (TUG): 21 sec w/ RW Berg Balance Scale: 39/56  M-CTSIB  Condition 1: Firm Surface, EO 30 Sec, Normal and Mild Sway  Condition 2: Firm Surface, EC 30 Sec, Mild Sway  Condition 3: Foam Surface, EO 30 Sec, Mild Sway  Condition 4: Foam Surface, EC 18  Sec, Moderate and Severe Sway        GOALS: Goals reviewed with patient? Yes  SHORT TERM GOALS: Target date: .same as LTG    LONG TERM GOALS: Target date: 01/06/2023    Patient will perform HEP with family/caregiver supervision for improved strength, balance, transfers, and gait  Baseline:  Goal status: MET  2.  Patient will achieve 15 seconds for TUG test to manifest reduced risk for falls Baseline: 21 sec w/ RW; (01/06/23) 20 sec w/ RW Goal status: IN PROGRESS  3.  Demo low risk for falls per score 45/56 Berg Balance Test Baseline: 39/56; (01/06/23) 46/56 Goal status: MET  4.  Improved BLE strength and balance per time of 15 sec 5xSTS to reduce risk for falls Baseline: 20 sec; (01/06/23) 20 sec w/ UE assist Goal status: IN PROGRESS    ASSESSMENT:  CLINICAL IMPRESSION: Training in managing balance and facilitation of righting reactions contending with uneven ground (grass) and external resistances.  Two instances of retro-LOB when throwing heavy medicine ball without functional righting reaction enacted requiring total assist for balance recovery.  Improved stability and single limb support evident during kicking ball activity as she was able to elicit functional response and shift without LOB and CGA for guarding. Activities finished with heavy resistance training for improved motor  control and recruitment  OBJECTIVE IMPAIRMENTS: Abnormal gait, decreased activity tolerance, decreased balance, decreased coordination, difficulty walking, decreased strength, and decreased motor control .   ACTIVITY LIMITATIONS: carrying, lifting, stairs, transfers, and locomotion level  PARTICIPATION LIMITATIONS: meal prep, community activity, and activities of interest-walking outdoors  PERSONAL FACTORS: Age, Time since onset of injury/illness/exacerbation, and 1 comorbidity: HD  are also affecting patient's functional outcome.   REHAB POTENTIAL: Good  CLINICAL DECISION MAKING: Evolving/moderate complexity  EVALUATION COMPLEXITY: Moderate  PLAN:  PT FREQUENCY: 2x/week  PT DURATION: 4 weeks  PLANNED INTERVENTIONS: Therapeutic exercises, Therapeutic activity, Neuromuscular re-education, Balance training, Gait training, Patient/Family education, Self Care, Joint mobilization, Stair training, Vestibular training, Canalith repositioning, Orthotic/Fit training, DME instructions, Aquatic Therapy, Dry Needling, Electrical stimulation, Spinal mobilization, Taping, Ionotophoresis '4mg'$ /ml Dexamethasone, and Manual therapy  PLAN FOR NEXT SESSION: HEP and review/additions. Assisted eccentrics for stand to sit   3:38 PM, 01/13/23 M. Sherlyn Lees, PT, DPT Physical Therapist- [place] Office Number: [phone]

## 2023-01-20 ENCOUNTER — Ambulatory Visit: Payer: Medicare Other

## 2023-01-20 DIAGNOSIS — R2689 Other abnormalities of gait and mobility: Secondary | ICD-10-CM

## 2023-01-20 DIAGNOSIS — R2681 Unsteadiness on feet: Secondary | ICD-10-CM | POA: Diagnosis not present

## 2023-01-20 DIAGNOSIS — R29818 Other symptoms and signs involving the nervous system: Secondary | ICD-10-CM

## 2023-01-20 DIAGNOSIS — M6281 Muscle weakness (generalized): Secondary | ICD-10-CM

## 2023-01-20 NOTE — Therapy (Signed)
OUTPATIENT PHYSICAL THERAPY NEURO TREATMENT   Patient Name: Barbara Krause MRN: [id number] DOB:10-Jul-1941, 82 y.o., female Today's Date: 01/20/2023   PCP: Jinny Sanders, MD REFERRING PROVIDER: Jinny Sanders, MD    END OF SESSION:  PT End of Session - 01/20/23 1318     Visit Number 11    Number of Visits 16    Date for PT Re-Evaluation 02/03/23    Authorization Type Medicare/BCBS    Progress Note Due on Visit 9    PT Start Time 1315    PT Stop Time 1400    PT Time Calculation (min) 45 min    Equipment Utilized During Treatment Gait belt    Activity Tolerance Patient tolerated treatment well    Behavior During Therapy WFL for tasks assessed/performed               Past Medical History:  Diagnosis Date   Breast cancer ([place]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   Depression    History of chicken pox    History of colon polyps    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[place])    Kidney stone    Osteoporosis    Past Surgical History:  Procedure Laterality Date   CESAREAN SECTION     1977 and [place] Bilateral 2009   Patient Active Problem List   Diagnosis Date Noted   Acute metabolic encephalopathy 0000000   Closed fracture of lower end of right radius with routine healing 11/04/2022   Constipation 10/17/2022   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   Oral candidiasis 10/04/2022   Acute cough 10/04/2022   Right thyroid nodule 10/02/2022   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 10/02/2022   Acute CVA (cerebrovascular accident) ([place]) 10/01/2022   Coronary atherosclerosis 08/03/2022   Compression fracture of thoracolumbar vertebra with routine healing 08/03/2022   DNR (do not resuscitate) 08/03/2022   At risk for aspiration 08/03/2022   Dysphagia 08/03/2022   High cholesterol 08/03/2022   Dementia ([place]) 08/03/2022   Acute midline low back pain without sciatica 03/22/2022   Recurrent falls 11/26/2020   Moderate  protein-calorie malnutrition ([place]) 06/30/2020   History of intracranial hemorrhage 06/15/2020   Aortic atherosclerosis ([place]) 08/30/2019   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[place]) 08/30/2019   Microscopic hematuria 04/02/2019   Osteoporosis 04/02/2019   Vitamin B12 deficiency 04/02/2019   Thoracic aortic aneurysm (TAA) ([place]) 04/02/2019   HTN (hypertension) 03/05/2019   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 "I would prefer an exercise session"   Pt accompanied by:  [name], caregiver  PERTINENT HISTORY:   PAIN:  Are you having pain? No 0/10  PRECAUTIONS: Fall  WEIGHT BEARING RESTRICTIONS: No  FALLS: Has patient fallen in last 6 months? Yes. Number of falls 2  LIVING ENVIRONMENT: Lives with: lives alone Lives in: House/apartment Stairs: Yes: Internal: BR/BA on main floor steps;   and External: yes steps; can reach both Has following equipment at home: Gilford Rile - 2 wheeled  PLOF: Independent and Independent with basic ADLs, some assist for showering  PATIENT GOALS:   OBJECTIVE:    TODAY'S TREATMENT: 01/20/23 Activity Comments  NU-step level x 6 min Level 3 for dynamic warm-up  Seated OKC PRE 3x10, 4# ankle weights -ankle pump -hip add  iso -LAQ -clamshells w/ green -hamstring curls w/ green  Stair taps w/ coordination Varying targets, BHR hold, 4# ankle weights crossing midline and recall 3 sequence patterns  Seated lat row/row 3x10 25#              HOME EXERCISE PROGRAM: Access Code: [email or web address] URL: [email or web address] Date: 12/21/2022 Prepared by: Sherlyn Lees  Exercises - Standing Balance in [place] with Eyes Closed  - 1 x daily - 7 x weekly - 3 sets - 30 sec hold - Corner Balance Feet Apart: Eyes Closed With Head Turns  - 1 x daily - 7 x weekly - 3 sets - 3-5 reps - Semi-Tandem Corner Balance With Eyes Open  - 1 x daily - 7 x weekly - 3 sets - 15-30 sec hold - Hooklying Clamshell with Resistance  - 1 x daily - 7 x weekly - 3 sets - 10 reps - Supine Bridge with Resistance Band  - 1 x daily - 7 x weekly - 3 sets - 10 reps  Below measures were taken at time of initial evaluation unless otherwise specified:  DIAGNOSTIC FINDINGS:   IMPRESSION: 1. No acute intracranial abnormalities. 2. Chronic microvascular disease and brain atrophy. 3. Remote left cerebellar hemisphere infarct.  COGNITION: Overall cognitive status: Within functional limits for tasks assessed   SENSATION: WFL  COORDINATION: Grossly impaired with alternating movements Heel to shin WFL  EDEMA:    MUSCLE TONE: WNL  MUSCLE LENGTH: NT  DTRs:    POSTURE: forward head  LOWER EXTREMITY ROM:     AROM WFL  LOWER EXTREMITY MMT:    Grossly 4/5 BLE  BED MOBILITY:  indep  TRANSFERS: Assistive device utilized: Environmental consultant - 2 wheeled  Sit to stand: Modified independence Stand to sit: Modified independence Chair to chair: Modified independence Floor:  NT  RAMP:  Level of Assistance: SBA Assistive device utilized: Environmental consultant - 2 wheeled Ramp Comments:   CURB:  Level of Assistance: SBA Assistive device utilized: Environmental consultant - 2 wheeled Curb Comments:   STAIRS: Level of Assistance: SBA Stair Negotiation Technique:  Step to Pattern with Bilateral Rails Number of Stairs: 5  Height of Stairs: 4-6"  Comments:   GAIT: Gait pattern: ataxic and wide BOS Distance walked:  Assistive device utilized: Environmental consultant - 2 wheeled Level of assistance: SBA Comments:   FUNCTIONAL TESTS:  5 times sit to stand: 20.43 Timed up and go (TUG): 21 sec w/ RW Berg Balance Scale: 39/56  M-CTSIB  Condition 1: Firm Surface, EO 30 Sec, Normal and Mild Sway  Condition 2: Firm Surface, EC 30 Sec, Mild Sway  Condition 3: Foam Surface, EO 30 Sec,  Mild Sway  Condition 4: Foam Surface, EC 18 Sec, Moderate and Severe Sway        GOALS: Goals reviewed with patient? Yes  SHORT TERM GOALS: Target date: .same as LTG    LONG TERM GOALS: Target date: 01/06/2023    Patient will perform HEP with family/caregiver supervision for improved strength, balance, transfers, and gait  Baseline:  Goal status: MET  2.  Patient will achieve 15 seconds for TUG test to manifest reduced risk for falls Baseline: 21 sec w/ RW; (01/06/23) 20 sec w/ RW Goal status: IN PROGRESS  3.  Demo low risk for falls per score 45/56 Berg Balance Test Baseline: 39/56; (01/06/23) 46/56 Goal status: MET  4.  Improved BLE strength and balance per time of 15 sec 5xSTS to reduce risk for falls Baseline: 20 sec; (01/06/23) 20 sec w/ UE assist Goal status: IN PROGRESS    ASSESSMENT:  CLINICAL IMPRESSION: Pt requests more exercise-based plan today with emphasis on heavy resistance. Tolerated treatement session well without adverse effects. Able to progress seated resistance from red to green t-band. Difficulty in recall for 3-4 sequence activity  OBJECTIVE IMPAIRMENTS: Abnormal gait, decreased activity tolerance, decreased balance, decreased coordination, difficulty walking, decreased strength, and decreased motor control .   ACTIVITY LIMITATIONS: carrying, lifting, stairs, transfers, and locomotion level  PARTICIPATION LIMITATIONS: meal prep, community  activity, and activities of interest-walking outdoors  PERSONAL FACTORS: Age, Time since onset of injury/illness/exacerbation, and 1 comorbidity: HD  are also affecting patient's functional outcome.   REHAB POTENTIAL: Good  CLINICAL DECISION MAKING: Evolving/moderate complexity  EVALUATION COMPLEXITY: Moderate  PLAN:  PT FREQUENCY: 2x/week  PT DURATION: 4 weeks  PLANNED INTERVENTIONS: Therapeutic exercises, Therapeutic activity, Neuromuscular re-education, Balance training, Gait training, Patient/Family education, Self Care, Joint mobilization, Stair training, Vestibular training, Canalith repositioning, Orthotic/Fit training, DME instructions, Aquatic Therapy, Dry Needling, Electrical stimulation, Spinal mobilization, Taping, Ionotophoresis 4mg /ml Dexamethasone, and Manual therapy  PLAN FOR NEXT SESSION: HEP and review/additions. Assisted eccentrics for stand to sit   1:18 PM, 01/20/23 M. Sherlyn Lees, PT, DPT Physical Therapist- [place] Office Number: [phone]

## 2023-01-25 ENCOUNTER — Ambulatory Visit: Payer: Medicare Other

## 2023-01-25 DIAGNOSIS — R2689 Other abnormalities of gait and mobility: Secondary | ICD-10-CM

## 2023-01-25 DIAGNOSIS — R2681 Unsteadiness on feet: Secondary | ICD-10-CM | POA: Diagnosis not present

## 2023-01-25 DIAGNOSIS — I63541 Cerebral infarction due to unspecified occlusion or stenosis of right cerebellar artery: Secondary | ICD-10-CM

## 2023-01-25 DIAGNOSIS — R293 Abnormal posture: Secondary | ICD-10-CM

## 2023-01-25 DIAGNOSIS — M6281 Muscle weakness (generalized): Secondary | ICD-10-CM

## 2023-01-25 DIAGNOSIS — R29818 Other symptoms and signs involving the nervous system: Secondary | ICD-10-CM

## 2023-01-25 NOTE — Therapy (Signed)
OUTPATIENT PHYSICAL THERAPY NEURO TREATMENT   Patient Name: Barbara Krause MRN: [id number] DOB:August 18, 1941, 82 y.o., female Today's Date: 01/25/2023   PCP: Jinny Sanders, MD REFERRING PROVIDER: Jinny Sanders, MD    END OF SESSION:  PT End of Session - 01/25/23 1321     Visit Number 12    Number of Visits 16    Date for PT Re-Evaluation 02/03/23    Authorization Type Medicare/BCBS    Progress Note Due on Visit 18    PT Start Time 1315    PT Stop Time 1400    PT Time Calculation (min) 45 min    Equipment Utilized During Treatment Gait belt    Activity Tolerance Patient tolerated treatment well    Behavior During Therapy WFL for tasks assessed/performed               Past Medical History:  Diagnosis Date   Breast cancer ([place]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   Depression    History of chicken pox    History of colon polyps    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[place])    Kidney stone    Osteoporosis    Past Surgical History:  Procedure Laterality Date   CESAREAN SECTION     1977 and [place] Bilateral 2009   Patient Active Problem List   Diagnosis Date Noted   Acute metabolic encephalopathy 0000000   Closed fracture of lower end of right radius with routine healing 11/04/2022   Constipation 10/17/2022   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[place]) 10/04/2022   Oral candidiasis 10/04/2022   Acute cough 10/04/2022   Right thyroid nodule 10/02/2022   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 10/02/2022   Acute CVA (cerebrovascular accident) ([place]) 10/01/2022   Coronary atherosclerosis 08/03/2022   Compression fracture of thoracolumbar vertebra with routine healing 08/03/2022   DNR (do not resuscitate) 08/03/2022   At risk for aspiration 08/03/2022   Dysphagia 08/03/2022   High cholesterol 08/03/2022   Dementia ([place]) 08/03/2022   Acute midline low back pain without sciatica 03/22/2022   Recurrent falls 11/26/2020   Moderate  protein-calorie malnutrition ([place]) 06/30/2020   History of intracranial hemorrhage 06/15/2020   Aortic atherosclerosis ([place]) 08/30/2019   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[place]) 08/30/2019   Microscopic hematuria 04/02/2019   Osteoporosis 04/02/2019   Vitamin B12 deficiency 04/02/2019   Thoracic aortic aneurysm (TAA) ([place]) 04/02/2019   HTN (hypertension) 03/05/2019   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   Tobacco use 03/05/2019   Depression, major, single episode, complete remission ([place]) 03/05/2019   Hx of breast cancer 03/05/2019   S/P bilateral mastectomy 03/05/2019    ONSET DATE:   REFERRING DIAG: G10 (ICD-10-CM) - 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[place])  THERAPY DIAG:  Unsteadiness on feet  Other abnormalities of gait and mobility  Muscle weakness (generalized)  Other symptoms and signs involving the nervous system  Abnormal posture  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[facility])  Rationale for Evaluation and Treatment: Rehabilitation  SUBJECTIVE:  SUBJECTIVE STATEMENT: Had a good weekend. Did not do very much.   Pt accompanied by:  [name], caregiver  PERTINENT HISTORY:   PAIN:  Are you having pain? No 0/10  PRECAUTIONS: Fall  WEIGHT BEARING RESTRICTIONS: No  FALLS: Has patient fallen in last 6 months? Yes. Number of falls 2  LIVING ENVIRONMENT: Lives with: lives alone Lives in: House/apartment Stairs: Yes: Internal: BR/BA on main floor steps;   and External: yes steps; can reach both Has following equipment at home: Gilford Rile - 2 wheeled  PLOF: Independent and Independent with basic ADLs, some assist for showering  PATIENT GOALS:   OBJECTIVE:   TODAY'S TREATMENT: 01/25/23 Activity Comments  NU-step level 4 x 8 min for endurance training Cues in LE position, cadence  Seated arm  abduction activity for large amplitude   Seated arm abduction activity for large amplitude 30 sec rounds with narrow BOS, semi-tandem  Med ball twist/toss To improve trunk strength and postural perturabtion of external resistance   Reactive balance/reach outside BOS   Eccentric stand to sit 3x5 6.6# med ball  Suitcase carry 4x25 ft 8#     TODAY'S TREATMENT: 01/20/23 Activity Comments  NU-step level x 6 min Level 3 for dynamic warm-up  Seated OKC PRE 3x10, 4# ankle weights -ankle pump -hip add iso -LAQ -clamshells w/ green -hamstring curls w/ green  Stair taps w/ coordination Varying targets, BHR hold, 4# ankle weights crossing midline and recall 3 sequence patterns  Seated lat row/row 3x10 25#              HOME EXERCISE PROGRAM: Access Code: [email or web address] URL: [email or web address] Date: 12/21/2022 Prepared by: Sherlyn Lees  Exercises - Standing Balance in [place] with Eyes Closed  - 1 x daily - 7 x weekly - 3 sets - 30 sec hold - Corner Balance Feet Apart: Eyes Closed With Head Turns  - 1 x daily - 7 x weekly - 3 sets - 3-5 reps - Semi-Tandem Corner Balance With Eyes Open  - 1 x daily - 7 x weekly - 3 sets - 15-30 sec hold - Hooklying Clamshell with Resistance  - 1 x daily - 7 x weekly - 3 sets - 10 reps - Supine Bridge with Resistance Band  - 1 x daily - 7 x weekly - 3 sets - 10 reps  Below measures were taken at time of initial evaluation unless otherwise specified:  DIAGNOSTIC FINDINGS:   IMPRESSION: 1. No acute intracranial abnormalities. 2. Chronic microvascular disease and brain atrophy. 3. Remote left cerebellar hemisphere infarct.  COGNITION: Overall cognitive status: Within functional limits for tasks assessed   SENSATION: WFL  COORDINATION: Grossly impaired with alternating movements Heel to shin WFL  EDEMA:    MUSCLE TONE: WNL  MUSCLE LENGTH: NT  DTRs:    POSTURE: forward head  LOWER EXTREMITY ROM:     AROM WFL  LOWER  EXTREMITY MMT:    Grossly 4/5 BLE  BED MOBILITY:  indep  TRANSFERS: Assistive device utilized: Environmental consultant - 2 wheeled  Sit to stand: Modified independence Stand to sit: Modified independence Chair to chair: Modified independence Floor:  NT  RAMP:  Level of Assistance: SBA Assistive device utilized: Environmental consultant - 2 wheeled Ramp Comments:   CURB:  Level of Assistance: SBA Assistive device utilized: Environmental consultant - 2 wheeled Curb Comments:   STAIRS: Level of Assistance: SBA Stair Negotiation Technique: Step to Pattern with Bilateral Rails Number of Stairs: 5  Height of Stairs: 4-6"  Comments:   GAIT: Gait  pattern: ataxic and wide BOS Distance walked:  Assistive device utilized: Environmental consultant - 2 wheeled Level of assistance: SBA Comments:   FUNCTIONAL TESTS:  5 times sit to stand: 20.43 Timed up and go (TUG): 21 sec w/ RW Berg Balance Scale: 39/56  M-CTSIB  Condition 1: Firm Surface, EO 30 Sec, Normal and Mild Sway  Condition 2: Firm Surface, EC 30 Sec, Mild Sway  Condition 3: Foam Surface, EO 30 Sec, Mild Sway  Condition 4: Foam Surface, EC 18 Sec, Moderate and Severe Sway        GOALS: Goals reviewed with patient? Yes  SHORT TERM GOALS: Target date: .same as LTG    LONG TERM GOALS: Target date: 01/06/2023    Patient will perform HEP with family/caregiver supervision for improved strength, balance, transfers, and gait  Baseline:  Goal status: MET  2.  Patient will achieve 15 seconds for TUG Krause to manifest reduced risk for falls Baseline: 21 sec w/ RW; (01/06/23) 20 sec w/ RW Goal status: IN PROGRESS  3.  Demo low risk for falls per score 45/56 Berg Balance Krause Baseline: 39/56; (01/06/23) 46/56 Goal status: MET  4.  Improved BLE strength and balance per time of 15 sec 5xSTS to reduce risk for falls Baseline: 20 sec; (01/06/23) 20 sec w/ UE assist Goal status: IN PROGRESS    ASSESSMENT:  CLINICAL IMPRESSION: Skilled services to improve balance and withstand postural  perturbations to facilitate righting reactions.  Training in eccentric control for stand to sit to improve strength/recruitment and control to reduce instances of falling to chair with good results using med ball hold to reinforce BLE control. Continued sessions to improve general strength, conditioning, and motor control to enhance mobility and reduce risk for falls.   OBJECTIVE IMPAIRMENTS: Abnormal gait, decreased activity tolerance, decreased balance, decreased coordination, difficulty walking, decreased strength, and decreased motor control .   ACTIVITY LIMITATIONS: carrying, lifting, stairs, transfers, and locomotion level  PARTICIPATION LIMITATIONS: meal prep, community activity, and activities of interest-walking outdoors  PERSONAL FACTORS: Age, Time since onset of injury/illness/exacerbation, and 1 comorbidity: HD  are also affecting patient's functional outcome.   REHAB POTENTIAL: Good  CLINICAL DECISION MAKING: Evolving/moderate complexity  EVALUATION COMPLEXITY: Moderate  PLAN:  PT FREQUENCY: 2x/week  PT DURATION: 4 weeks  PLANNED INTERVENTIONS: Therapeutic exercises, Therapeutic activity, Neuromuscular re-education, Balance training, Gait training, Patient/Family education, Self Care, Joint mobilization, Stair training, Vestibular training, Canalith repositioning, Orthotic/Fit training, DME instructions, Aquatic Therapy, Dry Needling, Electrical stimulation, Spinal mobilization, Taping, Ionotophoresis 4mg /ml Dexamethasone, and Manual therapy  PLAN FOR NEXT SESSION: HEP and review/additions. Assisted eccentrics for stand to sit   1:21 PM, 01/25/23 M. Sherlyn Lees, PT, DPT Physical Therapist- [place] Office Number: [phone]

## 2023-01-27 ENCOUNTER — Ambulatory Visit: Payer: Medicare Other

## 2023-01-27 DIAGNOSIS — M6281 Muscle weakness (generalized): Secondary | ICD-10-CM

## 2023-01-27 DIAGNOSIS — R2681 Unsteadiness on feet: Secondary | ICD-10-CM | POA: Diagnosis not present

## 2023-01-27 DIAGNOSIS — R29818 Other symptoms and signs involving the nervous system: Secondary | ICD-10-CM

## 2023-01-27 DIAGNOSIS — R293 Abnormal posture: Secondary | ICD-10-CM

## 2023-01-27 DIAGNOSIS — R2689 Other abnormalities of gait and mobility: Secondary | ICD-10-CM

## 2023-01-27 DIAGNOSIS — I63541 Cerebral infarction due to unspecified occlusion or stenosis of right cerebellar artery: Secondary | ICD-10-CM

## 2023-01-27 NOTE — Therapy (Signed)
OUTPATIENT PHYSICAL THERAPY NEURO TREATMENT   Patient Name: Barbara Krause MRN: [id number] DOB:1940/11/05, 82 y.o., female Today's Date: 01/27/2023   PCP: Jinny Sanders, MD REFERRING PROVIDER: Jinny Sanders, MD    END OF SESSION:  PT End of Session - 01/27/23 1453     Visit Number 13    Number of Visits 16    Date for PT Re-Evaluation 02/03/23    Authorization Type Medicare/BCBS    Progress Note Due on Visit 18    PT Start Time [id number]    PT Stop Time [id number]    PT Time Calculation (min) 45 min    Equipment Utilized During Treatment Gait belt    Activity Tolerance Patient tolerated treatment well    Behavior During Therapy WFL for tasks assessed/performed               Past Medical History:  Diagnosis Date   Breast cancer ([place]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   Depression    History of chicken pox    History of colon polyps    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[place])    Kidney stone    Osteoporosis    Past Surgical History:  Procedure Laterality Date   CESAREAN SECTION     1977 and [place] Bilateral 2009   Patient Active Problem List   Diagnosis Date Noted   Acute metabolic encephalopathy 0000000   Closed fracture of lower end of right radius with routine healing 11/04/2022   Constipation 10/17/2022   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[place]) 10/04/2022   Oral candidiasis 10/04/2022   Acute cough 10/04/2022   Right thyroid nodule 10/02/2022   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 10/02/2022   Acute CVA (cerebrovascular accident) ([place]) 10/01/2022   Coronary atherosclerosis 08/03/2022   Compression fracture of thoracolumbar vertebra with routine healing 08/03/2022   DNR (do not resuscitate) 08/03/2022   At risk for aspiration 08/03/2022   Dysphagia 08/03/2022   High cholesterol 08/03/2022   Dementia ([place]) 08/03/2022   Acute midline low back pain without sciatica 03/22/2022   Recurrent falls 11/26/2020   Moderate  protein-calorie malnutrition ([place]) 06/30/2020   History of intracranial hemorrhage 06/15/2020   Aortic atherosclerosis ([place]) 08/30/2019   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[place]) 08/30/2019   Microscopic hematuria 04/02/2019   Osteoporosis 04/02/2019   Vitamin B12 deficiency 04/02/2019   Thoracic aortic aneurysm (TAA) ([place]) 04/02/2019   HTN (hypertension) 03/05/2019   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   Tobacco use 03/05/2019   Depression, major, single episode, complete remission ([place]) 03/05/2019   Hx of breast cancer 03/05/2019   S/P bilateral mastectomy 03/05/2019    ONSET DATE:   REFERRING DIAG: G10 (ICD-10-CM) - 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[place])  THERAPY DIAG:  Unsteadiness on feet  Other abnormalities of gait and mobility  Muscle weakness (generalized)  Other symptoms and signs involving the nervous system  Abnormal posture  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[facility])  Rationale for Evaluation and Treatment: Rehabilitation  SUBJECTIVE:  SUBJECTIVE STATEMENT: Had a good weekend. Did not do very much.   Pt accompanied by:  [name], caregiver  PERTINENT HISTORY:   PAIN:  Are you having pain? No 0/10  PRECAUTIONS: Fall  WEIGHT BEARING RESTRICTIONS: No  FALLS: Has patient fallen in last 6 months? Yes. Number of falls 2  LIVING ENVIRONMENT: Lives with: lives alone Lives in: House/apartment Stairs: Yes: Internal: BR/BA on main floor steps;   and External: yes steps; can reach both Has following equipment at home: Gilford Rile - 2 wheeled  PLOF: Independent and Independent with basic ADLs, some assist for showering  PATIENT GOALS:   OBJECTIVE:   TODAY'S TREATMENT: 01/27/23 Activity Comments  NU-step level 5 x 8 min For endurance/conditioning  Stand to sit eccentrics 5x5 22" seat  height, 10# dumbell, incr difficulty with eccentric control today, slight improvement with 24" seat height  Suitcase carry 4x45 ft, alt hands 10#  LAQ 3x10 5#  Standing hip abd 3x10 5#  Standing hamstring curls 3x10 5#     TODAY'S TREATMENT: 01/25/23 Activity Comments  NU-step level 4 x 8 min for endurance training Cues in LE position, cadence  Seated arm abduction activity for large amplitude   Seated arm abduction activity for large amplitude 30 sec rounds with narrow BOS, semi-tandem  Med ball twist/toss To improve trunk strength and postural perturabtion of external resistance   Reactive balance/reach outside BOS   Eccentric stand to sit 3x5 6.6# med ball  Suitcase carry 4x25 ft 8#          HOME EXERCISE PROGRAM: Access Code: [email or web address] URL: [email or web address] Date: 12/21/2022 Prepared by: Sherlyn Lees  Exercises - Standing Balance in [place] with Eyes Closed  - 1 x daily - 7 x weekly - 3 sets - 30 sec hold - Corner Balance Feet Apart: Eyes Closed With Head Turns  - 1 x daily - 7 x weekly - 3 sets - 3-5 reps - Semi-Tandem Corner Balance With Eyes Open  - 1 x daily - 7 x weekly - 3 sets - 15-30 sec hold - Hooklying Clamshell with Resistance  - 1 x daily - 7 x weekly - 3 sets - 10 reps - Supine Bridge with Resistance Band  - 1 x daily - 7 x weekly - 3 sets - 10 reps  Below measures were taken at time of initial evaluation unless otherwise specified:  DIAGNOSTIC FINDINGS:   IMPRESSION: 1. No acute intracranial abnormalities. 2. Chronic microvascular disease and brain atrophy. 3. Remote left cerebellar hemisphere infarct.  COGNITION: Overall cognitive status: Within functional limits for tasks assessed   SENSATION: WFL  COORDINATION: Grossly impaired with alternating movements Heel to shin WFL  EDEMA:    MUSCLE TONE: WNL  MUSCLE LENGTH: NT  DTRs:    POSTURE: forward head  LOWER EXTREMITY ROM:     AROM WFL  LOWER EXTREMITY MMT:     Grossly 4/5 BLE  BED MOBILITY:  indep  TRANSFERS: Assistive device utilized: Environmental consultant - 2 wheeled  Sit to stand: Modified independence Stand to sit: Modified independence Chair to chair: Modified independence Floor:  NT  RAMP:  Level of Assistance: SBA Assistive device utilized: Environmental consultant - 2 wheeled Ramp Comments:   CURB:  Level of Assistance: SBA Assistive device utilized: Environmental consultant - 2 wheeled Curb Comments:   STAIRS: Level of Assistance: SBA Stair Negotiation Technique: Step to Pattern with Bilateral Rails Number of Stairs: 5  Height of Stairs: 4-6"  Comments:   GAIT: Gait pattern: ataxic and wide BOS  Distance walked:  Assistive device utilized: Environmental consultant - 2 wheeled Level of assistance: SBA Comments:   FUNCTIONAL TESTS:  5 times sit to stand: 20.43 Timed up and go (TUG): 21 sec w/ RW Berg Balance Scale: 39/56  M-CTSIB  Condition 1: Firm Surface, EO 30 Sec, Normal and Mild Sway  Condition 2: Firm Surface, EC 30 Sec, Mild Sway  Condition 3: Foam Surface, EO 30 Sec, Mild Sway  Condition 4: Foam Surface, EC 18 Sec, Moderate and Severe Sway        GOALS: Goals reviewed with patient? Yes  SHORT TERM GOALS: Target date: .same as LTG    LONG TERM GOALS: Target date: 01/06/2023    Patient will perform HEP with family/caregiver supervision for improved strength, balance, transfers, and gait  Baseline:  Goal status: MET  2.  Patient will achieve 15 seconds for TUG test to manifest reduced risk for falls Baseline: 21 sec w/ RW; (01/06/23) 20 sec w/ RW Goal status: IN PROGRESS  3.  Demo low risk for falls per score 45/56 Berg Balance Test Baseline: 39/56; (01/06/23) 46/56 Goal status: MET  4.  Improved BLE strength and balance per time of 15 sec 5xSTS to reduce risk for falls Baseline: 20 sec; (01/06/23) 20 sec w/ UE assist Goal status: IN PROGRESS    ASSESSMENT:  CLINICAL IMPRESSION: Continued with training in general conditioning and strength to improve  motor control with emphasis on eccentric control for stand to sit by way of loaded stand to sit followed by unencumbered sit to stand requiring height of 24" seat to maintain requisite control. Further strength activities for BLE to promote activity tolerance and muscular recruitment.  OBJECTIVE IMPAIRMENTS: Abnormal gait, decreased activity tolerance, decreased balance, decreased coordination, difficulty walking, decreased strength, and decreased motor control .   ACTIVITY LIMITATIONS: carrying, lifting, stairs, transfers, and locomotion level  PARTICIPATION LIMITATIONS: meal prep, community activity, and activities of interest-walking outdoors  PERSONAL FACTORS: Age, Time since onset of injury/illness/exacerbation, and 1 comorbidity: HD  are also affecting patient's functional outcome.   REHAB POTENTIAL: Good  CLINICAL DECISION MAKING: Evolving/moderate complexity  EVALUATION COMPLEXITY: Moderate  PLAN:  PT FREQUENCY: 2x/week  PT DURATION: 4 weeks  PLANNED INTERVENTIONS: Therapeutic exercises, Therapeutic activity, Neuromuscular re-education, Balance training, Gait training, Patient/Family education, Self Care, Joint mobilization, Stair training, Vestibular training, Canalith repositioning, Orthotic/Fit training, DME instructions, Aquatic Therapy, Dry Needling, Electrical stimulation, Spinal mobilization, Taping, Ionotophoresis 4mg /ml Dexamethasone, and Manual therapy  PLAN FOR NEXT SESSION: HEP and review/additions. Assisted eccentrics for stand to sit   2:53 PM, 01/27/23 M. Sherlyn Lees, PT, DPT Physical Therapist- [place] Office Number: [phone]

## 2023-01-29 ENCOUNTER — Other Ambulatory Visit: Payer: Self-pay | Admitting: Family Medicine

## 2023-01-29 DIAGNOSIS — M810 Age-related osteoporosis without current pathological fracture: Secondary | ICD-10-CM

## 2023-01-29 DIAGNOSIS — F325 Major depressive disorder, single episode, in full remission: Secondary | ICD-10-CM

## 2023-01-31 MED ORDER — ESCITALOPRAM OXALATE 20 MG PO TABS: 20.0000 mg | ORAL_TABLET | Freq: Every day | ORAL | 0 refills | Status: DC

## 2023-01-31 MED ORDER — ATORVASTATIN CALCIUM 40 MG PO TABS: 40.0000 mg | ORAL_TABLET | Freq: Every day | ORAL | 2 refills | Status: DC

## 2023-01-31 MED ORDER — ALENDRONATE SODIUM 70 MG PO TABS: 70.0000 mg | ORAL_TABLET | ORAL | 0 refills | Status: DC

## 2023-01-31 NOTE — Addendum Note (Signed)
Addended by: Carter Kitten on: 01/31/2023 11:34 AM   Modules accepted: Orders

## 2023-02-01 ENCOUNTER — Ambulatory Visit: Payer: Medicare Other | Attending: Family Medicine

## 2023-02-01 DIAGNOSIS — R2689 Other abnormalities of gait and mobility: Secondary | ICD-10-CM

## 2023-02-01 DIAGNOSIS — R2681 Unsteadiness on feet: Secondary | ICD-10-CM | POA: Diagnosis present

## 2023-02-01 DIAGNOSIS — M6281 Muscle weakness (generalized): Secondary | ICD-10-CM

## 2023-02-01 DIAGNOSIS — R29818 Other symptoms and signs involving the nervous system: Secondary | ICD-10-CM | POA: Diagnosis present

## 2023-02-01 NOTE — Therapy (Signed)
OUTPATIENT PHYSICAL THERAPY NEURO TREATMENT   Patient Name: Barbara Krause MRN: [id number] DOB:06-24-1941, 82 y.o., female Today's Date: 02/01/2023   PCP: Jinny Sanders, MD REFERRING PROVIDER: Jinny Sanders, MD    END OF SESSION:  PT End of Session - 02/01/23 1321     Visit Number 14    Number of Visits 16    Date for PT Re-Evaluation 02/03/23    Authorization Type Medicare/BCBS    Progress Note Due on Visit 8    PT Start Time 1315    PT Stop Time 1400    PT Time Calculation (min) 45 min    Equipment Utilized During Treatment Gait belt    Activity Tolerance Patient tolerated treatment well    Behavior During Therapy WFL for tasks assessed/performed               Past Medical History:  Diagnosis Date   Breast cancer ([place]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   Depression    History of chicken pox    History of colon polyps    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[place])    Kidney stone    Osteoporosis    Past Surgical History:  Procedure Laterality Date   CESAREAN SECTION     1977 and [place] Bilateral 2009   Patient Active Problem List   Diagnosis Date Noted   Acute metabolic encephalopathy 0000000   Closed fracture of lower end of right radius with routine healing 11/04/2022   Constipation 10/17/2022   Cerebral infarction involving right cerebellar artery 10/04/2022   Oral candidiasis 10/04/2022   Acute cough 10/04/2022   Right thyroid nodule 10/02/2022   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 10/02/2022   Acute CVA (cerebrovascular accident) 10/01/2022   Coronary atherosclerosis 08/03/2022   Compression fracture of thoracolumbar vertebra with routine healing 08/03/2022   DNR (do not resuscitate) 08/03/2022   At risk for aspiration 08/03/2022   Dysphagia 08/03/2022   High cholesterol 08/03/2022   Dementia 08/03/2022   Acute midline low back pain without sciatica 03/22/2022   Recurrent falls 11/26/2020   Moderate protein-calorie  malnutrition 06/30/2020   History of intracranial hemorrhage 06/15/2020   Aortic atherosclerosis 08/30/2019   Emphysema of lung 08/30/2019   Microscopic hematuria 04/02/2019   Osteoporosis 04/02/2019   Vitamin B12 deficiency 04/02/2019   Thoracic aortic aneurysm (TAA) 04/02/2019   HTN (hypertension) 03/05/2019   Huntington's disease 03/05/2019   Tobacco use 03/05/2019   Depression, major, single episode, complete remission 03/05/2019   Hx of breast cancer 03/05/2019   S/P bilateral mastectomy 03/05/2019    ONSET DATE:   REFERRING DIAG: G10 (ICD-10-CM) - 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[place])  THERAPY DIAG:  Unsteadiness on feet  Other abnormalities of gait and mobility  Muscle weakness (generalized)  Other symptoms and signs involving the nervous system  Rationale for Evaluation and Treatment: Rehabilitation  SUBJECTIVE:  SUBJECTIVE STATEMENT: Had a good weekend with family   Pt accompanied by:  [name], caregiver  PERTINENT HISTORY:   PAIN:  Are you having pain? No 0/10  PRECAUTIONS: Fall  WEIGHT BEARING RESTRICTIONS: No  FALLS: Has patient fallen in last 6 months? Yes. Number of falls 2  LIVING ENVIRONMENT: Lives with: lives alone Lives in: House/apartment Stairs: Yes: Internal: BR/BA on main floor steps;   and External: yes steps; can reach both Has following equipment at home: Gilford Rile - 2 wheeled  PLOF: Independent and Independent with basic ADLs, some assist for showering  PATIENT GOALS:   OBJECTIVE:   TODAY'S TREATMENT: 02/01/23 Activity Comments  NU-step resistance intervals x 8 min 30 sec heavy; 60 sec light  Tall kneeling on mat, mat table for UE support -chest press red ball 2x10 -PNF chop 2x10  Stand to sit eccentric 3x5 10# 26" seat height  LAQ 3x10 5#  Alt stair taps 20x  5#, 6" box  Seated hamstring curls 2x10 10#    TODAY'S TREATMENT: 01/27/23 Activity Comments  NU-step level 5 x 8 min For endurance/conditioning  Stand to sit eccentrics 5x5 22" seat height, 10# dumbell, incr difficulty with eccentric control today, slight improvement with 24" seat height  Suitcase carry 4x45 ft, alt hands 10#  LAQ 3x10 5#  Standing hip abd 3x10 5#  Standing hamstring curls 3x10 5#        HOME EXERCISE PROGRAM: Access Code: [email or web address] URL: [email or web address] Date: 12/21/2022 Prepared by: Sherlyn Lees  Exercises - Standing Balance in [place] with Eyes Closed  - 1 x daily - 7 x weekly - 3 sets - 30 sec hold - Corner Balance Feet Apart: Eyes Closed With Head Turns  - 1 x daily - 7 x weekly - 3 sets - 3-5 reps - Semi-Tandem Corner Balance With Eyes Open  - 1 x daily - 7 x weekly - 3 sets - 15-30 sec hold - Hooklying Clamshell with Resistance  - 1 x daily - 7 x weekly - 3 sets - 10 reps - Supine Bridge with Resistance Band  - 1 x daily - 7 x weekly - 3 sets - 10 reps  Below measures were taken at time of initial evaluation unless otherwise specified:  DIAGNOSTIC FINDINGS:   IMPRESSION: 1. No acute intracranial abnormalities. 2. Chronic microvascular disease and brain atrophy. 3. Remote left cerebellar hemisphere infarct.  COGNITION: Overall cognitive status: Within functional limits for tasks assessed   SENSATION: WFL  COORDINATION: Grossly impaired with alternating movements Heel to shin WFL  EDEMA:    MUSCLE TONE: WNL  MUSCLE LENGTH: NT  DTRs:    POSTURE: forward head  LOWER EXTREMITY ROM:     AROM WFL  LOWER EXTREMITY MMT:    Grossly 4/5 BLE  BED MOBILITY:  indep  TRANSFERS: Assistive device utilized: Environmental consultant - 2 wheeled  Sit to stand: Modified independence Stand to sit: Modified independence Chair to chair: Modified independence Floor:  NT  RAMP:  Level of Assistance: SBA Assistive device utilized: Environmental consultant - 2  wheeled Ramp Comments:   CURB:  Level of Assistance: SBA Assistive device utilized: Environmental consultant - 2 wheeled Curb Comments:   STAIRS: Level of Assistance: SBA Stair Negotiation Technique: Step to Pattern with Bilateral Rails Number of Stairs: 5  Height of Stairs: 4-6"  Comments:   GAIT: Gait pattern: ataxic and wide BOS Distance walked:  Assistive device utilized: Walker - 2 wheeled Level of assistance: SBA Comments:   FUNCTIONAL TESTS:  5 times  sit to stand: 20.43 Timed up and go (TUG): 21 sec w/ RW Berg Balance Scale: 39/56  M-CTSIB  Condition 1: Firm Surface, EO 30 Sec, Normal and Mild Sway  Condition 2: Firm Surface, EC 30 Sec, Mild Sway  Condition 3: Foam Surface, EO 30 Sec, Mild Sway  Condition 4: Foam Surface, EC 18 Sec, Moderate and Severe Sway        GOALS: Goals reviewed with patient? Yes  SHORT TERM GOALS: Target date: .same as LTG    LONG TERM GOALS: Target date: 01/06/2023    Patient will perform HEP with family/caregiver supervision for improved strength, balance, transfers, and gait  Baseline:  Goal status: MET  2.  Patient will achieve 15 seconds for TUG test to manifest reduced risk for falls Baseline: 21 sec w/ RW; (01/06/23) 20 sec w/ RW Goal status: IN PROGRESS  3.  Demo low risk for falls per score 45/56 Berg Balance Test Baseline: 39/56; (01/06/23) 46/56 Goal status: MET  4.  Improved BLE strength and balance per time of 15 sec 5xSTS to reduce risk for falls Baseline: 20 sec; (01/06/23) 20 sec w/ UE assist Goal status: IN PROGRESS    ASSESSMENT:  CLINICAL IMPRESSION: Focus on core/proximal control by way of tall kneeling to eliminate LE ataxia for focused/isolated movement/control.  Training in eccentric control from elevated EOM to discourage sudden drop to seated surfaces with improved carryover 50% of the time. Continued with strength program for improved muscular recruitment. Next visit will perform D/C summary and HEP  review  OBJECTIVE IMPAIRMENTS: Abnormal gait, decreased activity tolerance, decreased balance, decreased coordination, difficulty walking, decreased strength, and decreased motor control .   ACTIVITY LIMITATIONS: carrying, lifting, stairs, transfers, and locomotion level  PARTICIPATION LIMITATIONS: meal prep, community activity, and activities of interest-walking outdoors  PERSONAL FACTORS: Age, Time since onset of injury/illness/exacerbation, and 1 comorbidity: HD  are also affecting patient's functional outcome.   REHAB POTENTIAL: Good  CLINICAL DECISION MAKING: Evolving/moderate complexity  EVALUATION COMPLEXITY: Moderate  PLAN:  PT FREQUENCY: 2x/week  PT DURATION: 4 weeks  PLANNED INTERVENTIONS: Therapeutic exercises, Therapeutic activity, Neuromuscular re-education, Balance training, Gait training, Patient/Family education, Self Care, Joint mobilization, Stair training, Vestibular training, Canalith repositioning, Orthotic/Fit training, DME instructions, Aquatic Therapy, Dry Needling, Electrical stimulation, Spinal mobilization, Taping, Ionotophoresis 4mg /ml Dexamethasone, and Manual therapy  PLAN FOR NEXT SESSION: HEP and review/additions. Assisted eccentrics for stand to sit   1:22 PM, 02/01/23 M. Sherlyn Lees, PT, DPT Physical Therapist- [place] Office Number: [phone]

## 2023-02-03 ENCOUNTER — Ambulatory Visit (INDEPENDENT_AMBULATORY_CARE_PROVIDER_SITE_OTHER): Payer: Medicare Other | Admitting: Family Medicine

## 2023-02-03 ENCOUNTER — Ambulatory Visit: Payer: Medicare Other

## 2023-02-03 ENCOUNTER — Encounter: Payer: Self-pay | Admitting: Family Medicine

## 2023-02-03 VITALS — BP 124/82 | HR 78 | Temp 97.6°F | Ht 66.0 in | Wt 121.2 lb

## 2023-02-03 DIAGNOSIS — Z720 Tobacco use: Secondary | ICD-10-CM

## 2023-02-03 DIAGNOSIS — R634 Abnormal weight loss: Secondary | ICD-10-CM

## 2023-02-03 DIAGNOSIS — M810 Age-related osteoporosis without current pathological fracture: Secondary | ICD-10-CM

## 2023-02-03 DIAGNOSIS — F02A0 Dementia in other diseases classified elsewhere, mild, without behavioral disturbance, psychotic disturbance, mood disturbance, and anxiety: Secondary | ICD-10-CM

## 2023-02-03 DIAGNOSIS — G10 Huntington's disease: Secondary | ICD-10-CM

## 2023-02-03 DIAGNOSIS — E7800 Pure hypercholesterolemia, unspecified: Secondary | ICD-10-CM

## 2023-02-03 DIAGNOSIS — I10 Essential (primary) hypertension: Secondary | ICD-10-CM

## 2023-02-03 DIAGNOSIS — F325 Major depressive disorder, single episode, in full remission: Secondary | ICD-10-CM

## 2023-02-03 DIAGNOSIS — Z8673 Personal history of transient ischemic attack (TIA), and cerebral infarction without residual deficits: Secondary | ICD-10-CM

## 2023-02-03 NOTE — Assessment & Plan Note (Signed)
On baby aspirin p.o. daily On cholesterol medication

## 2023-02-03 NOTE — Assessment & Plan Note (Addendum)
Chronic, well-controlled  Lexapro 20 mg p.o. daily Abilify 2 mg 2 tablets daily

## 2023-02-03 NOTE — Assessment & Plan Note (Addendum)
Chronic, progressive, followed by neurology Dr. Nicki Reaper at [facility] and scopolamine patches.

## 2023-02-03 NOTE — Assessment & Plan Note (Signed)
Precontemplative to cessation.

## 2023-02-03 NOTE — Assessment & Plan Note (Signed)
Stable, chronic.  Continue current medication.   Continue with losartan 25mg  daily

## 2023-02-03 NOTE — Assessment & Plan Note (Signed)
Associated with Huntington's disease On Aricept 5 mg daily 

## 2023-02-03 NOTE — Assessment & Plan Note (Signed)
Acute likely poor appetite and limited nutritional intake.  Discussed in detail today.  Offered possible trial of Remeron for appetite stimulation but she wishes to hold off on more medication at this point.

## 2023-02-03 NOTE — Progress Notes (Signed)
Patient ID: Barbara Krause, female    DOB: Jun 21, 1941, 82 y.o.   MRN: [id number]  This visit was conducted in person.  BP 124/82 (BP Location: Left Arm)   Pulse 78   Temp 97.6 F (36.4 C) (Temporal)   Ht 5\' 6"  (1.676 m)   Wt 121 lb 3.2 oz (55 kg)   SpO2 98%   BMI 19.56 kg/m    CC: Subjective:   HPI: Barbara Krause is a 82 y.o. female presenting on 02/03/2023 for Annual Exam  The patient presents for review of chronic health problems. He/She also has the following acute concerns today: None She presents today with caregiver as daughters were not able to make it to office visit today.  The patient saw a LPN or RN for medicare wellness visit.  November 26, 2022  Prevention and wellness was reviewed in detail. Note reviewed and important notes copied below.4  Admitted December 1 to October 04, 2022 for acute CVA.     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 Nicki Reaper at [facility].. twice  a year.  Difficulty walking, frequent falls and balance. Effecting speech.  Hx of head injury after fall 2020-2021.. resulting in [place]... stable now... effected speech, word finding  After this she had significant set back.. now cannot do finances She has [employer] aide.. daily for 4 hours... helps with errands and  cooking.  Does ADLs for the most part, family pre-packages meals. (Able to do ADLs: Bathing, toileting,  feeding s)  Lives alone.  Some issues with aspiration per swallowing study... even with nectar thick foods.  Referred in the past  to palliative care. She has DNR, Has MOST form completed. HCPOA [name] Pais   Dementia on aricept 5 mg dialy  Daughter lives in [place]  Sister:  [name]   She is doing  speech exercises and daily PT.  She did fall and was seen at the emergency room December 15, 2022. Reviewed note in detail.  MDD:  Stable PHQ 9: 7 lexapro 20 mg daily Flowsheet Row Clinical Support from 11/26/2022 in [place] at [facility]  PHQ-2 Total Score 0       Hypertension:   Well controlled on cozaar 25 mg daily BP Readings from Last 3 Encounters:  02/03/23 124/82  12/15/22 126/78  11/04/22 134/72  Using medication without problems or lightheadedness:  none Chest pain with exertion: none Edema:none Short of breath: none Average home BPs: Other issues:    QUIT smoking 6 weeks ago  !!!  Patient congratulated on success.   Losing some weight.. drinks 2 boost and has dinner. Poor appetie Wt Readings from Last 3 Encounters:  02/03/23 121 lb 3.2 oz (55 kg)  12/15/22 123 lb 7.3 oz (56 kg)  11/26/22 125 lb (56.7 kg)  Body mass index is 19.56 kg/m.   Relevant past medical, surgical, family and social history reviewed and updated as indicated. Interim medical history since our last visit reviewed. Allergies and medications reviewed and updated. Outpatient Medications Prior to Visit  Medication Sig Dispense Refill   acetaminophen (TYLENOL) 325 MG tablet Take 2 tablets (650 mg total) by mouth every 4 (four) hours as needed for mild pain (or temp > 37.5 C (99.5 F)).     alendronate (FOSAMAX) 70 MG tablet Take 1 tablet (70 mg total) by mouth every 7 (seven) days. Take with a full glass of water on  an empty stomach. 12 tablet 0   ARIPiprazole (ABILIFY) 2 MG tablet Take 2 tablets (4 mg) daily     aspirin EC 81 MG tablet Take 1 tablet (81 mg total) by mouth daily. Swallow whole. 30 tablet 12   atorvastatin (LIPITOR) 40 MG tablet Take 1 tablet (40 mg total) by mouth daily. 90 tablet 2   Deutetrabenazine (AUSTEDO) 12 MG TABS Take 1 tablet by mouth daily in the morning,  and 2 tablets by mouth at bedtime 90 tablet 0   docusate sodium (COLACE) 100 MG capsule Take 1 capsule (100 mg total) by mouth daily. 10 capsule 0   donepezil (ARICEPT) 5 MG tablet TAKE 1 TABLET (5 MG TOTAL) BY MOUTH DAILY. 90 tablet 0   escitalopram (LEXAPRO) 20 MG tablet Take 1 tablet (20 mg total) by mouth daily. 90 tablet 0    losartan (COZAAR) 25 MG tablet Take 2 tablets (50 mg total) by mouth daily. 60 tablet 0   Multiple Vitamins-Minerals (ALIVE MULTI-VITAMIN PO) Take 1 tablet by mouth daily.     scopolamine (TRANSDERM-SCOP) 1 MG/3DAYS Place 1 patch (1.5 mg total) onto the skin every 3 (three) days. 10 patch 5   No facility-administered medications prior to visit.     Per HPI unless specifically indicated in ROS section below Review of Systems  Constitutional:  Positive for fatigue. Negative for fever.  HENT:  Negative for congestion.   Eyes:  Negative for pain.  Respiratory:  Negative for cough and shortness of breath.   Cardiovascular:  Negative for chest pain, palpitations and leg swelling.  Gastrointestinal:  Negative for abdominal pain.  Genitourinary:  Negative for dysuria and vaginal bleeding.  Musculoskeletal:  Negative for back pain.  Neurological:  Negative for syncope, light-headedness and headaches.  Psychiatric/Behavioral:  Negative for dysphoric mood.    Objective:  BP 124/82 (BP Location: Left Arm)   Pulse 78   Temp 97.6 F (36.4 C) (Temporal)   Ht 5\' 6"  (1.676 m)   Wt 121 lb 3.2 oz (55 kg)   SpO2 98%   BMI 19.56 kg/m   Wt Readings from Last 3 Encounters:  02/03/23 121 lb 3.2 oz (55 kg)  12/15/22 123 lb 7.3 oz (56 kg)  11/26/22 125 lb (56.7 kg)      Physical Exam Vitals and nursing note reviewed.  Constitutional:      General: She is not in acute distress.    Appearance: Normal appearance. She is well-developed. She is not ill-appearing or toxic-appearing.  HENT:     Head: Normocephalic.     Right Ear: Hearing, tympanic membrane, ear canal and external ear normal.     Left Ear: Hearing, tympanic membrane, ear canal and external ear normal.     Nose: Nose normal.  Eyes:     General: Lids are normal. Lids are everted, no foreign bodies appreciated.     Conjunctiva/sclera: Conjunctivae normal.     Pupils: Pupils are equal, round, and reactive to light.  Neck:     Thyroid:  No thyroid mass or thyromegaly.     Vascular: No carotid bruit.     Trachea: Trachea normal.  Cardiovascular:     Rate and Rhythm: Normal rate and regular rhythm.     Heart sounds: Normal heart sounds, S1 normal and S2 normal. No murmur heard.    No gallop.  Pulmonary:     Effort: Pulmonary effort is normal. No respiratory distress.     Breath sounds: Normal breath sounds. No wheezing,  rhonchi or rales.  Abdominal:     General: Bowel sounds are normal. There is no distension or abdominal bruit.     Palpations: Abdomen is soft. There is no fluid wave or mass.     Tenderness: There is no abdominal tenderness. There is no guarding or rebound.     Hernia: No hernia is present.  Musculoskeletal:     Cervical back: Normal range of motion and neck supple.  Lymphadenopathy:     Cervical: No cervical adenopathy.  Skin:    General: Skin is warm and dry.     Findings: No rash.  Neurological:     Mental Status: She is alert and oriented to person, place, and time.     Cranial Nerves: No cranial nerve deficit.     Sensory: No sensory deficit.     Motor: Abnormal muscle tone present.     Coordination: Coordination abnormal.     Gait: Gait abnormal.  Psychiatric:        Mood and Affect: Mood is not anxious or depressed.        Speech: Speech normal.        Behavior: Behavior normal. Behavior is cooperative.        Cognition and Memory: Cognition is not impaired. Memory is impaired.        Judgment: Judgment normal.       Results for orders placed or performed during the hospital encounter of 12/15/22  CBC with Differential  Result Value Ref Range   WBC 11.1 (H) 4.0 - 10.5 K/uL   RBC 4.11 3.87 - 5.11 MIL/uL   Hemoglobin 12.4 12.0 - 15.0 g/dL   HCT 38.3 36.0 - 46.0 %   MCV 93.2 80.0 - 100.0 fL   MCH 30.2 26.0 - 34.0 pg   MCHC 32.4 30.0 - 36.0 g/dL   RDW 12.9 11.5 - 15.5 %   Platelets 160 150 - 400 K/uL   nRBC 0.0 0.0 - 0.2 %   Neutrophils Relative % 80 %   Neutro Abs 8.8 (H) 1.7 -  7.7 K/uL   Lymphocytes Relative 12 %   Lymphs Abs 1.3 0.7 - 4.0 K/uL   Monocytes Relative 8 %   Monocytes Absolute 0.9 0.1 - 1.0 K/uL   Eosinophils Relative 0 %   Eosinophils Absolute 0.0 0.0 - 0.5 K/uL   Basophils Relative 0 %   Basophils Absolute 0.0 0.0 - 0.1 K/uL   Immature Granulocytes 0 %   Abs Immature Granulocytes 0.03 0.00 - 0.07 K/uL  Comprehensive metabolic panel  Result Value Ref Range   Sodium 140 135 - 145 mmol/L   Potassium 4.0 3.5 - 5.1 mmol/L   Chloride 105 98 - 111 mmol/L   CO2 28 22 - 32 mmol/L   Glucose, Bld 132 (H) 70 - 99 mg/dL   BUN 25 (H) 8 - 23 mg/dL   Creatinine, Ser 0.80 0.44 - 1.00 mg/dL   Calcium 9.1 8.9 - 10.3 mg/dL   Total Protein 6.3 (L) 6.5 - 8.1 g/dL   Albumin 3.8 3.5 - 5.0 g/dL   AST 26 15 - 41 U/L   ALT 19 0 - 44 U/L   Alkaline Phosphatase 111 38 - 126 U/L   Total Bilirubin 0.9 0.3 - 1.2 mg/dL   GFR, Estimated >60 >60 mL/min   Anion gap 7 5 - 15  Lipase, blood  Result Value Ref Range   Lipase 41 11 - 51 U/L  Urinalysis, Routine w reflex microscopic -Urine,  Clean Catch  Result Value Ref Range   Color, Urine YELLOW (A) YELLOW   APPearance HAZY (A) CLEAR   Specific Gravity, Urine 1.016 1.005 - 1.030   pH 5.0 5.0 - 8.0   Glucose, UA NEGATIVE NEGATIVE mg/dL   Hgb urine dipstick NEGATIVE NEGATIVE   Bilirubin Urine NEGATIVE NEGATIVE   Ketones, ur NEGATIVE NEGATIVE mg/dL   Protein, ur NEGATIVE NEGATIVE mg/dL   Nitrite POSITIVE (A) NEGATIVE   Leukocytes,Ua TRACE (A) NEGATIVE   RBC / HPF 11-20 0 - 5 RBC/hpf   WBC, UA 6-10 0 - 5 WBC/hpf   Bacteria, UA NONE SEEN NONE SEEN   Squamous Epithelial / HPF 0-5 0 - 5 /HPF   Mucus PRESENT      COVID 19 screen:  No recent travel or known exposure to COVID19 The patient denies respiratory symptoms of COVID 19 at this time. The importance of social distancing was discussed today.   Assessment and Plan  The patient's preventative maintenance and recommended screening tests for an annual wellness  exam were reviewed in full today. Brought up to date unless services declined.  Counselled on the importance of diet, exercise, and its role in overall health and mortality. The patient's FH and SH was reviewed, including their home life, tobacco status, and drug and alcohol status.   Vaccines: uptodate Shingrix, pneumonia series, Tdap Pap/DVE: Not indicated due to age 25:  History double mastectomy for breast  cancer. Not indicated. Bone Density: 03/15/2022 osteoporosis on Fosamax since March 2021 Colon: Not in the due to age Smoking Status:  non smoker ETOH/ drug use:  none/none  Hep C:  declined  HIV screen:     Problem List Items Addressed This Visit     Dementia    Associated with Huntington's disease On Aricept 5 mg daily      Relevant Medications   ARIPiprazole (ABILIFY) 2 MG tablet   Depression, major, single episode, complete remission    Chronic, well-controlled  Lexapro 20 mg p.o. daily Abilify 2 mg 2 tablets daily      High cholesterol    LDL at goal last check  Atorvastatin 40 mg p.o. daily      History of CVA (cerebrovascular accident)    On baby aspirin p.o. daily On cholesterol medication      HTN (hypertension) - Primary    Stable, chronic.  Continue current medication.   Continue with losartan 25mg  daily       Huntington's disease    Chronic, progressive, followed by neurology Dr. Nicki Reaper at [facility] and scopolamine patches.      Relevant Medications   ARIPiprazole (ABILIFY) 2 MG tablet   Osteoporosis   Tobacco use    Precontemplative to cessation.      Weight loss    Acute likely poor appetite and limited nutritional intake.  Discussed in detail today.  Offered possible trial of Remeron for appetite stimulation but she wishes to hold off on more medication at this point.     No orders of the defined types were placed in this encounter.      Eliezer Lofts, MD

## 2023-02-03 NOTE — Assessment & Plan Note (Addendum)
LDL at goal last check  Atorvastatin 40 mg p.o. daily

## 2023-02-07 ENCOUNTER — Other Ambulatory Visit: Payer: Self-pay | Admitting: Family Medicine

## 2023-02-07 DIAGNOSIS — M810 Age-related osteoporosis without current pathological fracture: Secondary | ICD-10-CM

## 2023-02-07 NOTE — Therapy (Signed)
OUTPATIENT PHYSICAL THERAPY NEURO TREATMENT   Patient Name: Barbara Krause MRN: [id number] DOB:12/08/1940, 82 y.o., female Today's Date: 02/07/2023   PCP: Excell SeltzerBedsole, Amy E, MD REFERRING PROVIDER: Excell SeltzerBedsole, Amy E, MD    END OF SESSION:      Past Medical History:  Diagnosis Date   Breast cancer 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   Depression    History of chicken pox    History of colon polyps    Huntington disease    Kidney stone    Osteoporosis    Past Surgical History:  Procedure Laterality Date   CESAREAN SECTION     1977 and 1979   CHOLECYSTECTOMY     TOTAL MASTECTOMY Bilateral 2009   Patient Active Problem List   Diagnosis Date Noted   Weight loss 02/03/2023   Closed fracture of lower end of right radius with routine healing 11/04/2022   Constipation 10/17/2022   History of CVA (cerebrovascular accident) 10/04/2022   Right thyroid nodule 10/02/2022   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 10/02/2022   Acute CVA (cerebrovascular accident) 10/01/2022   Coronary atherosclerosis 08/03/2022   Compression fracture of thoracolumbar vertebra with routine healing 08/03/2022   DNR (do not resuscitate) 08/03/2022   At risk for aspiration 08/03/2022   High cholesterol 08/03/2022   Dementia 08/03/2022   Acute midline low back pain without sciatica 03/22/2022   Recurrent falls 11/26/2020   Moderate protein-calorie malnutrition 06/30/2020   History of intracranial hemorrhage 06/15/2020   Aortic atherosclerosis 08/30/2019   Emphysema of lung 08/30/2019   Microscopic hematuria 04/02/2019   Osteoporosis 04/02/2019   Vitamin B12 deficiency 04/02/2019   Thoracic aortic aneurysm (TAA) 04/02/2019   HTN (hypertension) 03/05/2019   Huntington's disease 03/05/2019   Tobacco use 03/05/2019   Depression, major, single episode, complete remission 03/05/2019   Hx of breast cancer 03/05/2019   S/P bilateral mastectomy 03/05/2019    ONSET DATE:   REFERRING DIAG: G10 (ICD-10-CM) -  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HCC)  THERAPY DIAG:  No diagnosis found.  Rationale for Evaluation and Treatment: Rehabilitation  SUBJECTIVE:                                                                                                                                                                                             SUBJECTIVE STATEMENT: Had a good weekend with family   Pt accompanied by:  Selena BattenKim, caregiver  PERTINENT HISTORY:   PAIN:  Are you having pain? No 0/10  PRECAUTIONS: Fall  WEIGHT BEARING RESTRICTIONS: No  FALLS: Has patient fallen in last 6 months? Yes. Number of falls 2  LIVING ENVIRONMENT: Lives with: lives  alone Lives in: House/apartment Stairs: Yes: Internal: BR/BA on main floor steps;   and External: yes steps; can reach both Has following equipment at home: Walker - 2 wheeled  PLOF: Independent and Independent with basic ADLs, some assist for showering  PATIENT GOALS:   OBJECTIVE:     TODAY'S TREATMENT: 02/08/23 Activity Comments                        TODAY'S TREATMENT: 02/01/23 Activity Comments  NU-step resistance intervals x 8 min 30 sec heavy; 60 sec light  Tall kneeling on mat, mat table for UE support -chest press red ball 2x10 -PNF chop 2x10  Stand to sit eccentric 3x5 10# 26" seat height  LAQ 3x10 5#  Alt stair taps 20x 5#, 6" box  Seated hamstring curls 2x10 10#    TODAY'S TREATMENT: 01/27/23 Activity Comments  NU-step level 5 x 8 min For endurance/conditioning  Stand to sit eccentrics 5x5 22" seat height, 10# dumbell, incr difficulty with eccentric control today, slight improvement with 24" seat height  Suitcase carry 4x45 ft, alt hands 10#  LAQ 3x10 5#  Standing hip abd 3x10 5#  Standing hamstring curls 3x10 5#        HOME EXERCISE PROGRAM: Access Code: [email or web address] URL: [email or web address] Date: 12/21/2022 Prepared by: Shary Decamp  Exercises - Standing Balance in Corner with Eyes Closed  - 1 x daily  - 7 x weekly - 3 sets - 30 sec hold - Corner Balance Feet Apart: Eyes Closed With Head Turns  - 1 x daily - 7 x weekly - 3 sets - 3-5 reps - Semi-Tandem Corner Balance With Eyes Open  - 1 x daily - 7 x weekly - 3 sets - 15-30 sec hold - Hooklying Clamshell with Resistance  - 1 x daily - 7 x weekly - 3 sets - 10 reps - Supine Bridge with Resistance Band  - 1 x daily - 7 x weekly - 3 sets - 10 reps  Below measures were taken at time of initial evaluation unless otherwise specified:  DIAGNOSTIC FINDINGS:   IMPRESSION: 1. No acute intracranial abnormalities. 2. Chronic microvascular disease and brain atrophy. 3. Remote left cerebellar hemisphere infarct.  COGNITION: Overall cognitive status: Within functional limits for tasks assessed   SENSATION: WFL  COORDINATION: Grossly impaired with alternating movements Heel to shin WFL  EDEMA:    MUSCLE TONE: WNL  MUSCLE LENGTH: NT  DTRs:    POSTURE: forward head  LOWER EXTREMITY ROM:     AROM WFL  LOWER EXTREMITY MMT:    Grossly 4/5 BLE  BED MOBILITY:  indep  TRANSFERS: Assistive device utilized: Environmental consultant - 2 wheeled  Sit to stand: Modified independence Stand to sit: Modified independence Chair to chair: Modified independence Floor:  NT  RAMP:  Level of Assistance: SBA Assistive device utilized: Environmental consultant - 2 wheeled Ramp Comments:   CURB:  Level of Assistance: SBA Assistive device utilized: Environmental consultant - 2 wheeled Curb Comments:   STAIRS: Level of Assistance: SBA Stair Negotiation Technique: Step to Pattern with Bilateral Rails Number of Stairs: 5  Height of Stairs: 4-6"  Comments:   GAIT: Gait pattern: ataxic and wide BOS Distance walked:  Assistive device utilized: Environmental consultant - 2 wheeled Level of assistance: SBA Comments:   FUNCTIONAL TESTS:  5 times sit to stand: 20.43 Timed up and go (TUG): 21 sec w/ RW Berg Balance Scale: 39/56  M-CTSIB  Condition 1: Firm Surface, EO 30 Sec, Normal  and Mild Sway   Condition 2: Firm Surface, EC 30 Sec, Mild Sway  Condition 3: Foam Surface, EO 30 Sec, Mild Sway  Condition 4: Foam Surface, EC 18 Sec, Moderate and Severe Sway        GOALS: Goals reviewed with patient? Yes  SHORT TERM GOALS: Target date: .same as LTG    LONG TERM GOALS: Target date: 01/06/2023    Patient will perform HEP with family/caregiver supervision for improved strength, balance, transfers, and gait  Baseline:  Goal status: MET  2.  Patient will achieve 15 seconds for TUG test to manifest reduced risk for falls Baseline: 21 sec w/ RW; (01/06/23) 20 sec w/ RW Goal status: IN PROGRESS  3.  Demo low risk for falls per score 45/56 Berg Balance Test Baseline: 39/56; (01/06/23) 46/56 Goal status: MET  4.  Improved BLE strength and balance per time of 15 sec 5xSTS to reduce risk for falls Baseline: 20 sec; (01/06/23) 20 sec w/ UE assist Goal status: IN PROGRESS    ASSESSMENT:  CLINICAL IMPRESSION: Focus on core/proximal control by way of tall kneeling to eliminate LE ataxia for focused/isolated movement/control.  Training in eccentric control from elevated EOM to discourage sudden drop to seated surfaces with improved carryover 50% of the time. Continued with strength program for improved muscular recruitment. Next visit will perform D/C summary and HEP review  OBJECTIVE IMPAIRMENTS: Abnormal gait, decreased activity tolerance, decreased balance, decreased coordination, difficulty walking, decreased strength, and decreased motor control .   ACTIVITY LIMITATIONS: carrying, lifting, stairs, transfers, and locomotion level  PARTICIPATION LIMITATIONS: meal prep, community activity, and activities of interest-walking outdoors  PERSONAL FACTORS: Age, Time since onset of injury/illness/exacerbation, and 1 comorbidity: HD  are also affecting patient's functional outcome.   REHAB POTENTIAL: Good  CLINICAL DECISION MAKING: Evolving/moderate complexity  EVALUATION COMPLEXITY:  Moderate  PLAN:  PT FREQUENCY: 2x/week  PT DURATION: 4 weeks  PLANNED INTERVENTIONS: Therapeutic exercises, Therapeutic activity, Neuromuscular re-education, Balance training, Gait training, Patient/Family education, Self Care, Joint mobilization, Stair training, Vestibular training, Canalith repositioning, Orthotic/Fit training, DME instructions, Aquatic Therapy, Dry Needling, Electrical stimulation, Spinal mobilization, Taping, Ionotophoresis 4mg /ml Dexamethasone, and Manual therapy  PLAN FOR NEXT SESSION: HEP and review/additions. Assisted eccentrics for stand to sit

## 2023-02-08 ENCOUNTER — Ambulatory Visit: Payer: Medicare Other | Admitting: Physical Therapy

## 2023-02-08 ENCOUNTER — Encounter: Payer: Self-pay | Admitting: Physical Therapy

## 2023-02-08 DIAGNOSIS — R2689 Other abnormalities of gait and mobility: Secondary | ICD-10-CM

## 2023-02-08 DIAGNOSIS — M6281 Muscle weakness (generalized): Secondary | ICD-10-CM

## 2023-02-08 DIAGNOSIS — R2681 Unsteadiness on feet: Secondary | ICD-10-CM

## 2023-02-08 NOTE — Patient Instructions (Signed)
Youtube channel: Lexicographer with Sharyl Nimrod- this offers standing exercises that Bobbe can do with a caregiver and gait belt on  Can also search on Youtube: "seated exercises for seniors" for a gentle exercise regimen that she can do on her own.

## 2023-03-12 ENCOUNTER — Other Ambulatory Visit: Payer: Self-pay | Admitting: Family Medicine

## 2023-03-12 DIAGNOSIS — M810 Age-related osteoporosis without current pathological fracture: Secondary | ICD-10-CM

## 2023-03-12 DIAGNOSIS — F325 Major depressive disorder, single episode, in full remission: Secondary | ICD-10-CM

## 2023-03-14 MED ORDER — ATORVASTATIN CALCIUM 40 MG PO TABS: 40.0000 mg | ORAL_TABLET | Freq: Every day | ORAL | 2 refills | Status: AC

## 2023-03-14 NOTE — Telephone Encounter (Signed)
Received fax from [facility] requesting refill for Meloxicam 7.5 mg.  Meloxicam is not on current medication list.  Please advise.

## 2023-03-15 MED ORDER — MELOXICAM 7.5 MG PO TABS
7.5000 mg | ORAL_TABLET | Freq: Every day | ORAL | 0 refills | Status: DC
Start: 2023-03-15 — End: 2023-04-13

## 2023-03-15 NOTE — Telephone Encounter (Signed)
Refilled meloxicam as requested to use as needed daily at low-dose.

## 2023-04-13 ENCOUNTER — Other Ambulatory Visit: Payer: Self-pay | Admitting: Family Medicine

## 2023-04-13 NOTE — Telephone Encounter (Signed)
Last office visit 02/03/23 for CPE.  Last refilled 03/15/2023 for #30 with no refills.  Next Appt: No future appointments with PCP.

## 2023-04-14 ENCOUNTER — Ambulatory Visit: Payer: Medicare Other | Admitting: Occupational Therapy

## 2023-04-14 ENCOUNTER — Ambulatory Visit: Payer: Medicare Other | Admitting: Rehabilitative and Restorative Service Providers"

## 2023-04-14 ENCOUNTER — Ambulatory Visit: Payer: Medicare Other | Attending: Family Medicine | Admitting: Physical Therapy

## 2023-04-14 ENCOUNTER — Other Ambulatory Visit: Payer: Self-pay | Admitting: Family Medicine

## 2023-04-14 DIAGNOSIS — G10 Huntington's disease: Secondary | ICD-10-CM

## 2023-04-14 DIAGNOSIS — R2681 Unsteadiness on feet: Secondary | ICD-10-CM | POA: Insufficient documentation

## 2023-04-14 DIAGNOSIS — R2689 Other abnormalities of gait and mobility: Secondary | ICD-10-CM

## 2023-04-14 NOTE — Therapy (Signed)
[facility] [address], [address] [place], [place], [address] Phone: [phone]   Fax:  [phone]  Patient Details  Name: Barbara Krause MRN: [id number] Date of Birth: 1941-06-03 Referring Provider:  Gweneth Dimitri, MD  Encounter Date: 04/14/2023  MOVEMENT DISORDER SCRENING:  Timed Up and Go test: 25.31 seconds *slower speed with turns and transition to sit (21.19 at last PT session in April 2024) 10 meter walk test: 1.50 ft/sec  (0.457 m/s) 5 time sit to stand test: 19.59 seconds (16.56 at last PT session in April 2024)  Patient would benefit from Physical Therapy evaluation due to reports of worsening balance.   SUBJECTIVE: Patient reports increased movements in past 2 months. Her caregiver notes upper body moving involuntarily recently.  IIf you answer "Yes" to the following, you may benefit from occupational therapy.  Do you have difficulty cutting your food or do you spill food off your spoon or fork when eating? Yes_X__   No___         *No change in past 6 months, caregiver preps food by cutting  Do you have difficulty brushing your teeth, brushing your hair, or shaving? Yes___   No_x__          Do you have difficulty getting dressed (fastening buttons, tying your shoes, putting on your socks, pulling a shirt down in the back)? Yes___   No__x_        *doesn't do buttons or ties on shoes  Do you have difficulty getting in or out of the shower? Yes__x_   No___        *has assist , no change in past 6 months  Does your writing get small or is it hard to read?   Yes_x__   No___        *writing is worsening   Do you have increased difficulty with cooking, cleaning, or shopping?  (reaching for items in cabinets, carrying pots and pans, carrying laundry basket, carrying groceries, etc.)  Yes___   No___         N/a * does not do IADLs-- has caregiver  Do you have shoulder pain?  Yes___   No__x_           Is it taking you  longer to get ready for the day than it did 6 months ago? Yes___   No_x__  If you answer "Yes" to the following, you may benefit from physical therapy.   Do you have falls?     Yes___   No_x__           Do you feel unsteady while walking or notice changes in your balance?       Yes__x_   No___         * notes balance is worsening   Do you have difficulty getting started walking?   Yes___   No__x_           Do you experience "freezing" where you feel like your feet are stuck to the ground?     Yes___   No_x__           If you answer "Yes" to the following, you may benefit from speech therapy.    Do others tell you that they have difficulty hearing you?      Yes___   No__x_          Do you have difficulty swallowing food and/or liquids?  Yes___   No__x_          Do you have excess saliva?      Yes___   No_x__           During meals, do you cough or clear throat excessively?       Yes___   No_x_            Margretta Ditty, PT 04/14/2023, 12:11 PM    [facility] [address], [address] [place], [place], [address] Phone: [phone]   Fax:  [phone]

## 2023-04-27 ENCOUNTER — Other Ambulatory Visit: Payer: Self-pay

## 2023-04-27 ENCOUNTER — Other Ambulatory Visit: Payer: Self-pay | Admitting: Family Medicine

## 2023-04-27 NOTE — Telephone Encounter (Signed)
Requested Prescriptions   Refused Prescriptions Disp Refills   losartan (COZAAR) 25 MG tablet 180 tablet 1    Sig: Take 2 tablets (50 mg total) by mouth daily.    Refused By: Guerry Minors    Reason for Refusal: Refill not appropriate

## 2023-04-27 NOTE — Telephone Encounter (Signed)
Last office visit 02/03/23 for CPE.  Last refilled 04/13/2023 for #30 with no refills.  Next Appt: No future appointments with PCP.

## 2023-05-03 ENCOUNTER — Ambulatory Visit: Payer: Medicare Other | Attending: Family Medicine

## 2023-05-03 ENCOUNTER — Other Ambulatory Visit: Payer: Self-pay

## 2023-05-03 DIAGNOSIS — M6281 Muscle weakness (generalized): Secondary | ICD-10-CM | POA: Diagnosis present

## 2023-05-03 DIAGNOSIS — R2681 Unsteadiness on feet: Secondary | ICD-10-CM | POA: Diagnosis present

## 2023-05-03 DIAGNOSIS — R29818 Other symptoms and signs involving the nervous system: Secondary | ICD-10-CM | POA: Diagnosis present

## 2023-05-03 DIAGNOSIS — R2689 Other abnormalities of gait and mobility: Secondary | ICD-10-CM | POA: Diagnosis present

## 2023-05-03 DIAGNOSIS — R293 Abnormal posture: Secondary | ICD-10-CM | POA: Insufficient documentation

## 2023-05-03 DIAGNOSIS — G10 Huntington's disease: Secondary | ICD-10-CM | POA: Insufficient documentation

## 2023-05-03 NOTE — Therapy (Signed)
OUTPATIENT PHYSICAL THERAPY NEURO EVALUATION   Patient Name: Barbara Krause MRN: [id number] DOB:[birth year], 82 y.o., female Today's Date: 05/03/2023   PCP: Excell Seltzer, MD REFERRING PROVIDER: Excell Seltzer, MD  END OF SESSION:  PT End of Session - 05/03/23 1359     Visit Number 0    Number of Visits 8    Date for PT Re-Evaluation 06/21/23    Authorization Type Medicare/BCBS    Progress Note Due on Visit 10    PT Start Time 1400    PT Stop Time 1445    PT Time Calculation (min) 45 min             Past Medical History:  Diagnosis Date   Breast cancer (HCC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   Depression    History of chicken pox    History of colon polyps    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HCC)    Kidney stone    Osteoporosis    Past Surgical History:  Procedure Laterality Date   CESAREAN SECTION     1977 and 1979   CHOLECYSTECTOMY     TOTAL MASTECTOMY Bilateral 2009   Patient Active Problem List   Diagnosis Date Noted   Weight loss 02/03/2023   Closed fracture of lower end of right radius with routine healing 11/04/2022   Constipation 10/17/2022   History of CVA (cerebrovascular accident) 10/04/2022   Right thyroid nodule 10/02/2022   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 10/02/2022   Acute CVA (cerebrovascular accident) (HCC) 10/01/2022   Coronary atherosclerosis 08/03/2022   Compression fracture of thoracolumbar vertebra with routine healing 08/03/2022   DNR (do not resuscitate) 08/03/2022   At risk for aspiration 08/03/2022   High cholesterol 08/03/2022   Dementia (HCC) 08/03/2022   Acute midline low back pain without sciatica 03/22/2022   Recurrent falls 11/26/2020   Moderate protein-calorie malnutrition (HCC) 06/30/2020   History of intracranial hemorrhage 06/15/2020   Aortic atherosclerosis (HCC) 08/30/2019   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HCC) 08/30/2019   Microscopic hematuria 04/02/2019   Osteoporosis 04/02/2019   Vitamin B12 deficiency 04/02/2019   Thoracic aortic  aneurysm (TAA) (HCC) 04/02/2019   HTN (hypertension) 03/05/2019   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 No falls have been experienced. Denies any pain/discomfort. Using RW at all times. Caregiver in the day, alone at night.    Pt accompanied by:  caregiver: [name]  PERTINENT HISTORY: Huntington's Disease  PAIN:  Are you having pain? No  PRECAUTIONS: Fall  WEIGHT BEARING RESTRICTIONS: No  FALLS: Has patient fallen in last 6 months? No  LIVING ENVIRONMENT: Lives with: lives alone Lives in: House/apartment Stairs:  exterior to enter/exit. Ground-floor set-up Has following equipment at home: Dan Humphreys - 2 wheeled  PLOF: Independent and daughter assists with showering. Caregiver performs cooking and housekeeping  PATIENT GOALS: get stronger  OBJECTIVE:   DIAGNOSTIC FINDINGS:   COGNITION: Overall cognitive status: Within functional limits for tasks assessed   SENSATION: WFL  COORDINATION: Difficulty with rapid alternating Finger to nose WNL Nose to examiner's finger: WNL Heel to shin: WNL    MUSCLE TONE: WNL  MUSCLE LENGTH:   DTRs:  NT  POSTURE: forward head  LOWER EXTREMITY ROM:     Active   Right Eval Left Eval  Hip flexion    Hip extension    Hip abduction    Hip adduction    Hip internal rotation    Hip external rotation    Knee flexion    Knee extension    Ankle dorsiflexion 12 12  Ankle plantarflexion    Ankle inversion    Ankle eversion     (Blank rows = not tested)  LOWER EXTREMITY MMT:    MMT Right Eval Left Eval  Hip flexion    Hip extension    Hip abduction 4 3  Hip adduction 5 5  Hip internal rotation 4 4-  Hip external rotation 4 3+  Knee flexion 4 4  Knee extension 4 4  Ankle dorsiflexion 4+ 4+  Ankle plantarflexion    Ankle inversion    Ankle eversion    (Blank rows = not tested)  BED MOBILITY:  Independent   TRANSFERS: Assistive device utilized: Environmental consultant - 2 wheeled  Sit to stand: Complete Independence and Modified independence Stand to sit: Modified independence Chair to chair: Modified independence Floor:  NT    CURB:  Level of Assistance: SBA Assistive device utilized: Environmental consultant - 2 wheeled Curb Comments:   STAIRS: Level of Assistance: Modified independence Stair Negotiation Technique: Step to Pattern with Bilateral Rails Number of Stairs: 12  Height of Stairs: 4-6"  Comments:   GAIT: Gait pattern: wide BOS, some instances of left Trendelenberg Distance walked:  Assistive device utilized: Environmental consultant - 2 wheeled Level of assistance: Modified independence and SBA Comments:   FUNCTIONAL TESTS:  5 times sit to stand: 25 sec--poor eccentric control Timed up and go (TUG): 25 sec Berg Balance Scale: 33/56   TODAY'S TREATMENT:  DATE: 05/03/23    PATIENT EDUCATION: Education details: assessment details and comparing to past scores Person educated: Patient and Caregiver [name] Education method: Explanation Education comprehension: verbalized understanding  HOME EXERCISE PROGRAM: [id number] (past  HEP)  GOALS: Goals reviewed with patient? Yes  SHORT TERM GOALS: Target date: 05/24/2023    Independent with HEP with bias to right hip strength Baseline: Goal status: INITIAL    LONG TERM GOALS: Target date: 06/21/2023      Demo improved balance and reduced risk for falls per score 46/56 Berg Balance Test Baseline: 33/56 Goal status: INITIAL  2.  Improve safety with ambulation per time 20 sec TUG test Baseline: 25 sec w/ RW Goal status: INITIAL  3.  Demo improved BLE strength and balance per time 21 sec 5xSTS Baseline: 25 sec Goal status: INITIAL  4.  Improve left hip abductor strength to 4/5 to improve stability and single limb stance Baseline: 3/5 Goal status: INITIAL    ASSESSMENT:  CLINICAL IMPRESSION: Patient is a 82 y.o. lady who was seen today for physical therapy evaluation and treatment for movement/mobility deficits and limitations related to Huntington's Disease.  Patient is familiar to this clinic and demonstrates a decline in functional outcome measures, most notably a drop in her previous score of 46/56 Berg Balance Test to current 33/56 indicating high risk for falls.  Patient reports increasing weakness and postural deviation causing her to deviate/lean left and notes this is her weaker side as well. Patient would benefit from PT services to address deficits and limitations and reduce risk for falls   OBJECTIVE IMPAIRMENTS: Abnormal gait, decreased activity tolerance, decreased balance, decreased mobility, difficulty walking, decreased strength, and postural dysfunction.   ACTIVITY LIMITATIONS: carrying, lifting, standing, reach over head, and locomotion level  PARTICIPATION LIMITATIONS: meal prep, cleaning, and leisure  PERSONAL FACTORS: Age, Time since onset of injury/illness/exacerbation, and 1 comorbidity: Huntington's Dx  are also affecting patient's functional outcome.   REHAB POTENTIAL: Good  CLINICAL DECISION MAKING: Evolving/moderate  complexity  EVALUATION COMPLEXITY: Moderate  PLAN:  PT FREQUENCY: 1-2x/week 2x/wk x 2 wks then 1xwk x 4 wks  PT DURATION: 6 weeks  PLANNED INTERVENTIONS: Therapeutic exercises, Therapeutic activity, Neuromuscular re-education, Balance training, Gait training, Patient/Family education, Self Care, Joint mobilization, Stair training, Vestibular training, Canalith repositioning, Orthotic/Fit training, DME instructions, Aquatic Therapy, Dry Needling, Electrical stimulation, Spinal mobilization, Cryotherapy, Moist heat, Taping, Ionotophoresis 4mg /ml Dexamethasone, and Manual therapy  PLAN FOR NEXT SESSION: HEP review and additions to for left hip abd strength   5:14 PM, 05/03/23 M. Shary Decamp, PT, DPT Physical Therapist- [place] Office Number: [phone]

## 2023-05-11 NOTE — Therapy (Signed)
OUTPATIENT PHYSICAL THERAPY NEURO TREATMENT   Patient Name: Barbara Krause MRN: [id number] DOB:Aug 07, 1941, 82 y.o., female Today's Date: 05/12/2023   PCP: Excell Seltzer, MD REFERRING PROVIDER: Excell Seltzer, MD  END OF SESSION:  PT End of Session - 05/12/23 1359     Visit Number 2    Number of Visits 8    Date for PT Re-Evaluation 06/21/23    Authorization Type Medicare/BCBS    Progress Note Due on Visit 10    PT Start Time 1315    PT Stop Time 1356    PT Time Calculation (min) 41 min    Activity Tolerance Patient tolerated treatment well    Behavior During Therapy [facility] for tasks assessed/performed              Past Medical History:  Diagnosis Date   Breast cancer (HCC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   Depression    History of chicken pox    History of colon polyps    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HCC)    Kidney stone    Osteoporosis    Past Surgical History:  Procedure Laterality Date   CESAREAN SECTION     1977 and 1979   CHOLECYSTECTOMY     TOTAL MASTECTOMY Bilateral 2009   Patient Active Problem List   Diagnosis Date Noted   Weight loss 02/03/2023   Closed fracture of lower end of right radius with routine healing 11/04/2022   Constipation 10/17/2022   History of CVA (cerebrovascular accident) 10/04/2022   Right thyroid nodule 10/02/2022   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 10/02/2022   Acute CVA (cerebrovascular accident) (HCC) 10/01/2022   Coronary atherosclerosis 08/03/2022   Compression fracture of thoracolumbar vertebra with routine healing 08/03/2022   DNR (do not resuscitate) 08/03/2022   At risk for aspiration 08/03/2022   High cholesterol 08/03/2022   Dementia (HCC) 08/03/2022   Acute midline low back pain without sciatica 03/22/2022   Recurrent falls 11/26/2020   Moderate protein-calorie malnutrition (HCC) 06/30/2020   History of intracranial hemorrhage 06/15/2020   Aortic atherosclerosis (HCC) 08/30/2019   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HCC) 08/30/2019    Microscopic hematuria 04/02/2019   Osteoporosis 04/02/2019   Vitamin B12 deficiency 04/02/2019   Thoracic aortic aneurysm (TAA) (HCC) 04/02/2019   HTN (hypertension) 03/05/2019   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 Denies chest pain, SOB, NT, fever, night sweats. Caregiver reports that this level of fatigue is not normal for the patient.   Pt accompanied by:  caregiver: [name]  PERTINENT HISTORY: Huntington's Disease  PAIN:  Are you having pain? No  PRECAUTIONS: Fall  WEIGHT BEARING RESTRICTIONS: No  FALLS: Has patient fallen in last 6 months? No  LIVING ENVIRONMENT: Lives with: lives alone Lives in: House/apartment Stairs:  exterior to enter/exit. Ground-floor set-up Has following equipment at home: Dan Humphreys - 2 wheeled  PLOF: Independent and daughter assists with showering. Caregiver performs cooking and housekeeping  PATIENT GOALS: get stronger  OBJECTIVE:       TODAY'S TREATMENT: 05/12/23 Activity Comments  vitals 92spO2, 66bpm, 142/99 mmHg   Edu on pursed lip breathing  127/90 mmHg, 91% spO2, 71 bpm  Nustep L6 x 7 min After 4 min: 139/91 mmHg; 69bpm, 93 spO2    sidestepping red TB above knees in II bars Cues for large  purposeful step   mini squat with red TB above knees 10x Heavy verbal and manual cueing to avoid excessively deep squat with posterior LOB 138/93 mmHg, 90spO2, 67bpm   standing paloff press 10x each sided  In II bars for safety; cues to keep hips/shoulders square          PATIENT EDUCATION: Education details: educated pt and caregiver on red flag vitals and to present to ED if abnormal vitals occur; edu on tips to reduced freezing   Person educated: Patient and Caregiver [name] Education method: Explanation Education comprehension: verbalized understanding     Below measures were taken at time of initial evaluation unless otherwise specified:   DIAGNOSTIC FINDINGS:   COGNITION: Overall cognitive status: Within functional limits for tasks assessed   SENSATION: WFL  COORDINATION: Difficulty with rapid alternating Finger to nose WNL Nose to examiner's finger: WNL Heel to shin: WNL    MUSCLE TONE: WNL  MUSCLE LENGTH:   DTRs:  NT  POSTURE: forward head  LOWER EXTREMITY ROM:     Active  Right Eval Left Eval  Hip flexion    Hip extension    Hip abduction    Hip adduction    Hip internal rotation    Hip external rotation    Knee flexion    Knee extension    Ankle dorsiflexion 12 12  Ankle plantarflexion    Ankle inversion    Ankle eversion     (Blank rows = not tested)  LOWER EXTREMITY MMT:    MMT Right Eval Left Eval  Hip flexion    Hip extension    Hip abduction 4 3  Hip adduction 5 5  Hip internal rotation 4 4-  Hip external rotation 4 3+  Knee flexion 4 4  Knee extension 4 4  Ankle dorsiflexion 4+ 4+  Ankle plantarflexion    Ankle inversion    Ankle eversion    (Blank rows = not tested)  BED MOBILITY:  Independent   TRANSFERS: Assistive device utilized: Environmental consultant - 2 wheeled  Sit to stand: Complete Independence and Modified independence Stand to sit: Modified independence Chair to chair: Modified independence Floor:  NT    CURB:   Level of Assistance: SBA Assistive device utilized: Environmental consultant - 2 wheeled Curb Comments:   STAIRS: Level of Assistance: Modified independence Stair Negotiation Technique: Step to Pattern with Bilateral Rails Number of Stairs: 12  Height of Stairs: 4-6"  Comments:   GAIT: Gait pattern: wide BOS, some instances of left  Trendelenberg Distance walked:  Assistive device utilized: Environmental consultant - 2 wheeled Level of assistance: Modified independence and SBA Comments:   FUNCTIONAL TESTS:  5 times sit to stand: 25 sec--poor eccentric control Timed up and go (TUG): 25 sec Berg Balance Scale: 33/56   TODAY'S TREATMENT:                                                                                                                              DATE: 05/03/23    PATIENT EDUCATION: Education details: assessment details and comparing to past scores Person educated: Patient and Caregiver [name] Education method: Explanation Education comprehension: verbalized understanding  HOME EXERCISE PROGRAM: [id number] (past HEP)  GOALS: Goals reviewed with patient? Yes  SHORT TERM GOALS: Target date: 05/24/2023    Independent with HEP with bias to right hip strength Baseline: Goal status: IN PROGRESS    LONG TERM GOALS: Target date: 06/21/2023      Demo improved balance and reduced risk for falls per score 46/56 Berg Balance Test Baseline: 33/56 Goal status: IN PROGRESS  2.  Improve safety with ambulation per time 20 sec TUG test Baseline: 25 sec w/ RW Goal status: IN PROGRESS  3.  Demo improved BLE strength and balance per time 21 sec 5xSTS Baseline: 25 sec Goal status: IN PROGRESS  4.  Improve left hip abductor strength to 4/5 to improve stability and single limb stance Baseline: 3/5 Goal status: IN PROGRESS    ASSESSMENT:  CLINICAL IMPRESSION: Patient arrived to session with report of feeling tired and demonstrating slower gait speed with LE's dragging more than baseline. Vitals  revealed elevated diastolic BP; improved with sitting deep breathing break. Symptoms and vitals were monitored while proceeding with session. Patient performed progressive LE and core strengthening with cueing for larger amplitude movement, avoiding excessive posterior we shift, and positioning. Patient reported improved energy levels by end of session.   OBJECTIVE IMPAIRMENTS: Abnormal gait, decreased activity tolerance, decreased balance, decreased mobility, difficulty walking, decreased strength, and postural dysfunction.   ACTIVITY LIMITATIONS: carrying, lifting, standing, reach over head, and locomotion level  PARTICIPATION LIMITATIONS: meal prep, cleaning, and leisure  PERSONAL FACTORS: Age, Time since onset of injury/illness/exacerbation, and 1 comorbidity: Huntington's Dx  are also affecting patient's functional outcome.   REHAB POTENTIAL: Good  CLINICAL DECISION MAKING: Evolving/moderate complexity  EVALUATION COMPLEXITY: Moderate  PLAN:  PT FREQUENCY: 1-2x/week 2x/wk x 2 wks then 1xwk x 4 wks  PT DURATION: 6 weeks  PLANNED INTERVENTIONS: Therapeutic exercises, Therapeutic activity, Neuromuscular re-education, Balance training, Gait training, Patient/Family education, Self Care, Joint mobilization, Stair training, Vestibular training, Canalith repositioning, Orthotic/Fit training, DME instructions, Aquatic Therapy, Dry Needling, Electrical stimulation, Spinal mobilization, Cryotherapy, Moist heat, Taping, Ionotophoresis 4mg /ml Dexamethasone, and Manual therapy  PLAN FOR NEXT SESSION: HEP review and additions to for left hip abd strength   Anette Guarneri, PT, DPT 05/12/23 2:00 PM  [facility] [address] [place], [address] [place],  [place] [address] Phone # [phone] Fax # [phone]

## 2023-05-12 ENCOUNTER — Ambulatory Visit: Payer: Medicare Other | Admitting: Physical Therapy

## 2023-05-12 ENCOUNTER — Encounter: Payer: Self-pay | Admitting: Physical Therapy

## 2023-05-12 DIAGNOSIS — R2681 Unsteadiness on feet: Secondary | ICD-10-CM | POA: Diagnosis not present

## 2023-05-12 DIAGNOSIS — R29818 Other symptoms and signs involving the nervous system: Secondary | ICD-10-CM

## 2023-05-12 DIAGNOSIS — R2689 Other abnormalities of gait and mobility: Secondary | ICD-10-CM

## 2023-05-12 DIAGNOSIS — R293 Abnormal posture: Secondary | ICD-10-CM

## 2023-05-12 DIAGNOSIS — M6281 Muscle weakness (generalized): Secondary | ICD-10-CM

## 2023-05-13 ENCOUNTER — Telehealth: Payer: Self-pay | Admitting: Family Medicine

## 2023-05-13 NOTE — Telephone Encounter (Signed)
I spoke with Bonnee Quin (DPR signed) Bonnee Quin said her sister advised her to call for appt next wk for eval of elevated BP while pt was at PT visit on 05/12/23.pt has not had any H/A,dizziness,CP,SOB or vision changes. Bonnee Quin said she saw pt on 05/12/23 and pt seemed OK. Bonnee Quin has not been able to get in touch with care giver this afternoon to see what pts BP is today. Pt taking losartan 25 mg taking 2 tabs daily and Liana said she is sure pt has not missed med because pts caregiver gives pt her medications.Bonnee Quin will have caregiver ck BP over weekend and keep log., Pt has Huntingtons disease and seems to be drooling more than usual.Liana scheduled appt with Allayne Gitelman NP on 05/17/23 at 11 AM with UC & ED precautions given and Liana voiced understanding. Sending note to Allayne Gitelman NP, [facility] and FYI to Dr Ermalene Searing as PCP (Dr Ermalene Searing is out of office next wk).

## 2023-05-13 NOTE — Telephone Encounter (Signed)
[name] daughter, Bonnee Quin, called requesting an appt for pt for elevated BP & low heart rate. Bonnee Quin states while the pt was at Physical therapy on yesterday, 7/11, the pt's BP was 142/99, 139/91, 127/90, 138/93. Bonnee Quin stated the pt's heart rate was 66 & she believed her oxygen level was 92. Transferred Liana to [place]. Call back # [phone]

## 2023-05-13 NOTE — Telephone Encounter (Signed)
Noted, will evaluate. 

## 2023-05-13 NOTE — Telephone Encounter (Signed)
Thanks for seeing her

## 2023-05-16 NOTE — Telephone Encounter (Signed)
 Noted  

## 2023-05-16 NOTE — Telephone Encounter (Signed)
[name] daughter, Bonnee Quin, called to cancel pt's appt for 7/16. Bonnee Quin stated the pt didn't need appt anymore, didn't state why. Call back # [phone]

## 2023-05-17 ENCOUNTER — Ambulatory Visit: Payer: Medicare Other | Admitting: Primary Care

## 2023-05-17 ENCOUNTER — Ambulatory Visit: Payer: Medicare Other

## 2023-05-17 DIAGNOSIS — R2689 Other abnormalities of gait and mobility: Secondary | ICD-10-CM

## 2023-05-17 DIAGNOSIS — R2681 Unsteadiness on feet: Secondary | ICD-10-CM

## 2023-05-17 DIAGNOSIS — M6281 Muscle weakness (generalized): Secondary | ICD-10-CM

## 2023-05-17 DIAGNOSIS — R29818 Other symptoms and signs involving the nervous system: Secondary | ICD-10-CM

## 2023-05-17 DIAGNOSIS — R293 Abnormal posture: Secondary | ICD-10-CM

## 2023-05-17 NOTE — Therapy (Signed)
OUTPATIENT PHYSICAL THERAPY NEURO TREATMENT   Patient Name: Barbara Krause MRN: [id number] DOB:1941/06/16, 82 y.o., female Today's Date: 05/17/2023   PCP: Excell Seltzer, MD REFERRING PROVIDER: Excell Seltzer, MD  END OF SESSION:  PT End of Session - 05/17/23 1315     Visit Number 3    Number of Visits 8    Date for PT Re-Evaluation 06/21/23    Authorization Type Medicare/BCBS    Progress Note Due on Visit 10    PT Start Time 1315    PT Stop Time 1400    PT Time Calculation (min) 45 min    Activity Tolerance Patient tolerated treatment well    Behavior During Therapy [facility] for tasks assessed/performed              Past Medical History:  Diagnosis Date   Breast cancer (HCC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   Depression    History of chicken pox    History of colon polyps    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HCC)    Kidney stone    Osteoporosis    Past Surgical History:  Procedure Laterality Date   CESAREAN SECTION     1977 and 1979   CHOLECYSTECTOMY     TOTAL MASTECTOMY Bilateral 2009   Patient Active Problem List   Diagnosis Date Noted   Weight loss 02/03/2023   Closed fracture of lower end of right radius with routine healing 11/04/2022   Constipation 10/17/2022   History of CVA (cerebrovascular accident) 10/04/2022   Right thyroid nodule 10/02/2022   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 10/02/2022   Acute CVA (cerebrovascular accident) (HCC) 10/01/2022   Coronary atherosclerosis 08/03/2022   Compression fracture of thoracolumbar vertebra with routine healing 08/03/2022   DNR (do not resuscitate) 08/03/2022   At risk for aspiration 08/03/2022   High cholesterol 08/03/2022   Dementia (HCC) 08/03/2022   Acute midline low back pain without sciatica 03/22/2022   Recurrent falls 11/26/2020   Moderate protein-calorie malnutrition (HCC) 06/30/2020   History of intracranial hemorrhage 06/15/2020   Aortic atherosclerosis (HCC) 08/30/2019   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HCC) 08/30/2019    Microscopic hematuria 04/02/2019   Osteoporosis 04/02/2019   Vitamin B12 deficiency 04/02/2019   Thoracic aortic aneurysm (TAA) (HCC) 04/02/2019   HTN (hypertension) 03/05/2019   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   Tobacco use 03/05/2019   Depression, major, single episode, complete remission (HCC) 03/05/2019   Hx of breast cancer 03/05/2019   S/P bilateral mastectomy 03/05/2019    ONSET DATE: 03/2019  REFERRING DIAG: G10 (ICD-10-CM) - 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HCC)  THERAPY DIAG:  Unsteadiness on feet  Other abnormalities of gait and mobility  Muscle weakness (generalized)  Other symptoms and signs involving the nervous system  Abnormal posture  Rationale for Evaluation and Treatment: Rehabilitation  SUBJECTIVE:  SUBJECTIVE STATEMENT: I would like to work on balance and leg strength today  Pt accompanied by:  caregiver: [name]  PERTINENT HISTORY: Huntington's Disease  PAIN:  Are you having pain? No  PRECAUTIONS: Fall  WEIGHT BEARING RESTRICTIONS: No  FALLS: Has patient fallen in last 6 months? No  LIVING ENVIRONMENT: Lives with: lives alone Lives in: House/apartment Stairs:  exterior to enter/exit. Ground-floor set-up Has following equipment at home: Dan Humphreys - 2 wheeled  PLOF: Independent and daughter assists with showering. Caregiver performs cooking and housekeeping  PATIENT GOALS: get stronger  OBJECTIVE:    TODAY'S TREATMENT: 05/17/23 Activity Comments  NU-step level 6 x 8 min   LAQ 3x10 4#  Standing hip abd 2x10 4#  Alt stair taps 2x10 4#, 8" step  Stand to sit eccentric 1x10 Blue med ball  Gross motor coordination/balance -seated ball toss 10x (blue) -wide BOS granny toss 1x10 (blue) -overhead press 1x10 (blue) -standing trunk twists 1x10 (blue)  balance  -standing on foam: EO/EC 2x30 sec. Head turns EO/EC       PATIENT EDUCATION: Education details: educated pt and caregiver on red flag vitals and to present to ED if abnormal vitals occur; edu on tips to reduced freezing   Person educated: Patient and Caregiver [name] Education method: Explanation Education comprehension: verbalized understanding     Below measures were taken at time of initial evaluation unless otherwise specified:   DIAGNOSTIC FINDINGS:   COGNITION: Overall cognitive status: Within functional limits for tasks assessed   SENSATION: WFL  COORDINATION: Difficulty with rapid alternating Finger to nose WNL Nose to examiner's finger: WNL Heel to shin: WNL    MUSCLE TONE: WNL  MUSCLE LENGTH:   DTRs:  NT  POSTURE: forward head  LOWER EXTREMITY ROM:     Active  Right Eval Left Eval  Hip flexion    Hip extension    Hip abduction    Hip adduction    Hip internal rotation    Hip external rotation    Knee flexion    Knee extension    Ankle dorsiflexion 12 12  Ankle plantarflexion    Ankle inversion    Ankle eversion     (Blank rows = not tested)  LOWER EXTREMITY MMT:    MMT Right Eval Left Eval  Hip flexion    Hip extension    Hip abduction 4 3  Hip adduction 5 5  Hip internal rotation 4 4-  Hip external rotation 4 3+  Knee flexion 4 4  Knee extension 4 4  Ankle dorsiflexion 4+ 4+  Ankle plantarflexion    Ankle inversion    Ankle eversion    (Blank rows = not tested)  BED MOBILITY:  Independent   TRANSFERS: Assistive device utilized: Environmental consultant - 2 wheeled  Sit to stand: Complete Independence and Modified independence Stand to sit: Modified independence Chair to chair: Modified independence Floor:  NT    CURB:  Level of Assistance: SBA Assistive device utilized: Environmental consultant - 2 wheeled Curb Comments:   STAIRS: Level of Assistance: Modified independence Stair Negotiation Technique: Step to Pattern with Bilateral Rails Number  of Stairs: 12  Height of Stairs: 4-6"  Comments:   GAIT: Gait pattern: wide BOS, some instances of left Trendelenberg Distance walked:  Assistive device utilized: Environmental consultant - 2 wheeled Level of assistance: Modified independence and SBA Comments:   FUNCTIONAL TESTS:  5 times sit to stand: 25 sec--poor eccentric control Timed up and go (TUG): 25 sec Berg Balance Scale: 33/56  TODAY'S TREATMENT:                                                                                                                              DATE: 05/03/23    PATIENT EDUCATION: Education details: assessment details and comparing to past scores Person educated: Patient and Caregiver [name] Education method: Explanation Education comprehension: verbalized understanding  HOME EXERCISE PROGRAM: [id number] (past HEP)  GOALS: Goals reviewed with patient? Yes  SHORT TERM GOALS: Target date: 05/24/2023    Independent with HEP with bias to right hip strength Baseline: Goal status: IN PROGRESS    LONG TERM GOALS: Target date: 06/21/2023      Demo improved balance and reduced risk for falls per score 46/56 Berg Balance Test Baseline: 33/56 Goal status: IN PROGRESS  2.  Improve safety with ambulation per time 20 sec TUG test Baseline: 25 sec w/ RW Goal status: IN PROGRESS  3.  Demo improved BLE strength and balance per time 21 sec 5xSTS Baseline: 25 sec Goal status: IN PROGRESS  4.  Improve left hip abductor strength to 4/5 to improve stability and single limb stance Baseline: 3/5 Goal status: IN PROGRESS    ASSESSMENT:  CLINICAL IMPRESSION: Initiated activities with NU-step for rapid alternating movements followed by LE strength to improve control and LE recruitment with heavy resistance.  Gross motor control and coordination activities with emphasis on managing external load/postural perturbations to enhance balance and coordination.  Balance activities under multi-sensory conditions to facilitate  righting reactions and reduce risk for falls. Continued sessions to progress POC details  OBJECTIVE IMPAIRMENTS: Abnormal gait, decreased activity tolerance, decreased balance, decreased mobility, difficulty walking, decreased strength, and postural dysfunction.   ACTIVITY LIMITATIONS: carrying, lifting, standing, reach over head, and locomotion level  PARTICIPATION LIMITATIONS: meal prep, cleaning, and leisure  PERSONAL FACTORS: Age, Time since onset of injury/illness/exacerbation, and 1 comorbidity: Huntington's Dx  are also affecting patient's functional outcome.   REHAB POTENTIAL: Good  CLINICAL DECISION MAKING: Evolving/moderate complexity  EVALUATION COMPLEXITY: Moderate  PLAN:  PT FREQUENCY: 1-2x/week 2x/wk x 2 wks then 1xwk x 4 wks  PT DURATION: 6 weeks  PLANNED INTERVENTIONS: Therapeutic exercises, Therapeutic activity, Neuromuscular re-education, Balance training, Gait training, Patient/Family education, Self Care, Joint mobilization, Stair training, Vestibular training, Canalith repositioning, Orthotic/Fit training, DME instructions, Aquatic Therapy, Dry Needling, Electrical stimulation, Spinal mobilization, Cryotherapy, Moist heat, Taping, Ionotophoresis 4mg /ml Dexamethasone, and Manual therapy  PLAN FOR NEXT SESSION: open chain strength activities with theraloop for hip/knee strength   2:42 PM, 05/17/23 M. Shary Decamp, PT, DPT Physical Therapist- [place] Office Number: [phone]

## 2023-05-23 NOTE — Therapy (Signed)
OUTPATIENT PHYSICAL THERAPY NEURO TREATMENT   Patient Name: Barbara Krause MRN: [id number] DOB:29-Nov-1940, 82 y.o., female Today's Date: 05/24/2023   PCP: Excell Seltzer, MD REFERRING PROVIDER: Excell Seltzer, MD  END OF SESSION:  PT End of Session - 05/24/23 1443     Visit Number 4    Number of Visits 8    Date for PT Re-Evaluation 06/21/23    Authorization Type Medicare/BCBS    Progress Note Due on Visit 10    PT Start Time 1403    PT Stop Time 1444    PT Time Calculation (min) 41 min    Equipment Utilized During Treatment Gait belt    Activity Tolerance Patient tolerated treatment well    Behavior During Therapy WFL for tasks assessed/performed               Past Medical History:  Diagnosis Date   Breast cancer (HCC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   Depression    History of chicken pox    History of colon polyps    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HCC)    Kidney stone    Osteoporosis    Past Surgical History:  Procedure Laterality Date   CESAREAN SECTION     1977 and 1979   CHOLECYSTECTOMY     TOTAL MASTECTOMY Bilateral 2009   Patient Active Problem List   Diagnosis Date Noted   Weight loss 02/03/2023   Closed fracture of lower end of right radius with routine healing 11/04/2022   Constipation 10/17/2022   History of CVA (cerebrovascular accident) 10/04/2022   Right thyroid nodule 10/02/2022   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 10/02/2022   Acute CVA (cerebrovascular accident) (HCC) 10/01/2022   Coronary atherosclerosis 08/03/2022   Compression fracture of thoracolumbar vertebra with routine healing 08/03/2022   DNR (do not resuscitate) 08/03/2022   At risk for aspiration 08/03/2022   High cholesterol 08/03/2022   Dementia (HCC) 08/03/2022   Acute midline low back pain without sciatica 03/22/2022   Recurrent falls 11/26/2020   Moderate protein-calorie malnutrition (HCC) 06/30/2020   History of intracranial hemorrhage 06/15/2020   Aortic atherosclerosis  (HCC) 08/30/2019   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HCC) 08/30/2019   Microscopic hematuria 04/02/2019   Osteoporosis 04/02/2019   Vitamin B12 deficiency 04/02/2019   Thoracic aortic aneurysm (TAA) (HCC) 04/02/2019   HTN (hypertension) 03/05/2019   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 Denies recent falls.   Pt accompanied by:  caregiver: [name]  PERTINENT HISTORY: Huntington's Disease  PAIN:  Are you having pain? No  PRECAUTIONS: Fall  WEIGHT BEARING RESTRICTIONS: No  FALLS: Has patient fallen in last 6 months? No  LIVING ENVIRONMENT: Lives with: lives alone Lives in: House/apartment Stairs:  exterior to enter/exit. Ground-floor set-up Has following equipment at home: Dan Humphreys - 2 wheeled  PLOF: Independent and daughter assists with showering. Caregiver performs cooking and housekeeping  PATIENT GOALS: get stronger  OBJECTIVE:     TODAY'S TREATMENT: 05/24/23 Activity Comments  Nustep L5 x 7 min UEs/LEs  Maintaining ~40-45 SPM  LAQ with yellow TB 10x each LE HS curl with red TB 10x each sitting clam green TB 10x ball squeeze 10x3" Pt reports LAQ with TB is "too hard" but agreeable to performing without TB at home.  Frequent cueing to slow down and control movements   Standing in RW with back to mat scarf toss with PT Able to reach in different directions with and without use of RW support; occasional posterior sway/wt shift   mini squat With RW; performed 7 reps before c/o fatigue      HOME EXERCISE PROGRAM Last updated: 05/24/23 Access Code: [email or web address] URL: [email or web address] Date: 05/24/2023 Prepared by: [facility] - [facility]  Exercises - Standing Balance in Corner with Eyes Closed  - 1 x daily - 7 x weekly - 3 sets - 30 sec hold - Corner Balance Feet Apart: Eyes Closed With Head Turns  - 1 x daily - 7 x weekly - 3 sets - 3-5 reps - Semi-Tandem Corner Balance With Eyes Open  - 1 x daily - 7 x weekly - 3 sets - 15-30 sec hold - Supine Bridge with Resistance Band  - 1 x daily - 7 x weekly - 3 sets - 10 reps - Standing Hip Abduction with Counter Support  - 1 x daily - 5 x weekly - 2 sets - 10 reps - Sit to Stand with Resistance Around Legs  - 1 x daily - 7 x weekly - 3 sets - 5-8 reps - Seated Long Arc Quad  - 1 x daily - 5 x weekly - 2 sets - 10 reps - Seated Hip Abduction  - 1 x daily - 5 x weekly - 2 sets - 10 reps - Seated Hamstring Curl with Anchored Resistance  - 1 x daily - 5 x weekly - 2 sets - 10 reps - Seated Hip Adduction Isometrics with Ball  - 1 x daily - 5 x weekly - 2 sets - 10 reps - 3 sec hold   PATIENT EDUCATION: Education details: HEP update Person educated: Patient and Production assistant, radio method: Explanation, Demonstration, Tactile cues, Verbal cues, and Handouts Education comprehension: verbalized understanding and returned demonstration    Below measures were taken at time of initial evaluation unless otherwise specified:   DIAGNOSTIC FINDINGS:   COGNITION: Overall cognitive status: Within functional limits for tasks assessed   SENSATION: WFL  COORDINATION: Difficulty with rapid alternating Finger to nose WNL Nose  to examiner's finger: WNL Heel to shin: WNL    MUSCLE TONE: WNL  MUSCLE LENGTH:   DTRs:  NT  POSTURE: forward head  LOWER EXTREMITY ROM:     Active  Right Eval Left Eval  Hip flexion    Hip extension    Hip abduction    Hip adduction  Hip internal rotation    Hip external rotation    Knee flexion    Knee extension    Ankle dorsiflexion 12 12  Ankle plantarflexion    Ankle inversion    Ankle eversion     (Blank rows = not tested)  LOWER EXTREMITY MMT:    MMT Right Eval Left Eval  Hip flexion    Hip extension    Hip abduction 4 3  Hip adduction 5 5  Hip internal rotation 4 4-  Hip external rotation 4 3+  Knee flexion 4 4  Knee extension 4 4  Ankle dorsiflexion 4+ 4+  Ankle plantarflexion    Ankle inversion    Ankle eversion    (Blank rows = not tested)  BED MOBILITY:  Independent   TRANSFERS: Assistive device utilized: Environmental consultant - 2 wheeled  Sit to stand: Complete Independence and Modified independence Stand to sit: Modified independence Chair to chair: Modified independence Floor:  NT    CURB:  Level of Assistance: SBA Assistive device utilized: Environmental consultant - 2 wheeled Curb Comments:   STAIRS: Level of Assistance: Modified independence Stair Negotiation Technique: Step to Pattern with Bilateral Rails Number of Stairs: 12  Height of Stairs: 4-6"  Comments:   GAIT: Gait pattern: wide BOS, some instances of left Trendelenberg Distance walked:  Assistive device utilized: Environmental consultant - 2 wheeled Level of assistance: Modified independence and SBA Comments:   FUNCTIONAL TESTS:  5 times sit to stand: 25 sec--poor eccentric control Timed up and go (TUG): 25 sec Berg Balance Scale: 33/56   TODAY'S TREATMENT:                                                                                                                              DATE: 05/03/23    PATIENT EDUCATION: Education details: assessment details and comparing to past scores Person  educated: Patient and Caregiver [name] Education method: Explanation Education comprehension: verbalized understanding  HOME EXERCISE PROGRAM: [id number] (past HEP)  GOALS: Goals reviewed with patient? Yes  SHORT TERM GOALS: Target date: 05/24/2023    Independent with HEP with bias to right hip strength Baseline: Goal status: IN PROGRESS    LONG TERM GOALS: Target date: 06/21/2023      Demo improved balance and reduced risk for falls per score 46/56 Berg Balance Test Baseline: 33/56 Goal status: IN PROGRESS  2.  Improve safety with ambulation per time 20 sec TUG test Baseline: 25 sec w/ RW Goal status: IN PROGRESS  3.  Demo improved BLE strength and balance per time 21 sec 5xSTS Baseline: 25 sec Goal status: IN PROGRESS  4.  Improve left hip abductor strength to 4/5 to improve stability and single limb stance Baseline: 3/5 Goal status: IN PROGRESS    ASSESSMENT:  CLINICAL IMPRESSION: Patient arrived to session without complaints. Worked on LE strengthening activities with banded resistance and added into HEP. Patient noted excessive difficulty with banded LAQ, otherwise agreeable to perform other  activities performed today. Frequent cues required to decrease speed and improve control with activities. Patient demonstrated dynamic weight shifting/reaching activity with initial reliance on RW, but confidence appeared to improve with practice. Ended with additional LE strengthening which was limited by fatigue. Patient allowed to rest before exiting session.   OBJECTIVE IMPAIRMENTS: Abnormal gait, decreased activity tolerance, decreased balance, decreased mobility, difficulty walking, decreased strength, and postural dysfunction.   ACTIVITY LIMITATIONS: carrying, lifting, standing, reach over head, and locomotion level  PARTICIPATION LIMITATIONS: meal prep, cleaning, and leisure  PERSONAL FACTORS: Age, Time since onset of injury/illness/exacerbation, and 1 comorbidity:  Huntington's Dx  are also affecting patient's functional outcome.   REHAB POTENTIAL: Good  CLINICAL DECISION MAKING: Evolving/moderate complexity  EVALUATION COMPLEXITY: Moderate  PLAN:  PT FREQUENCY: 1-2x/week 2x/wk x 2 wks then 1xwk x 4 wks  PT DURATION: 6 weeks  PLANNED INTERVENTIONS: Therapeutic exercises, Therapeutic activity, Neuromuscular re-education, Balance training, Gait training, Patient/Family education, Self Care, Joint mobilization, Stair training, Vestibular training, Canalith repositioning, Orthotic/Fit training, DME instructions, Aquatic Therapy, Dry Needling, Electrical stimulation, Spinal mobilization, Cryotherapy, Moist heat, Taping, Ionotophoresis 4mg /ml Dexamethasone, and Manual therapy  PLAN FOR NEXT SESSION: open chain strength activities with theraloop for hip/knee strength   Anette Guarneri, PT, DPT 05/24/23 2:46 PM  [facility] at [facility] [address] [place], [place] [address] Phone # [phone] Fax # [phone]

## 2023-05-24 ENCOUNTER — Ambulatory Visit: Payer: Medicare Other | Admitting: Physical Therapy

## 2023-05-24 ENCOUNTER — Encounter: Payer: Self-pay | Admitting: Physical Therapy

## 2023-05-24 DIAGNOSIS — R2681 Unsteadiness on feet: Secondary | ICD-10-CM | POA: Diagnosis not present

## 2023-05-24 DIAGNOSIS — R293 Abnormal posture: Secondary | ICD-10-CM

## 2023-05-24 DIAGNOSIS — R29818 Other symptoms and signs involving the nervous system: Secondary | ICD-10-CM

## 2023-05-24 DIAGNOSIS — R2689 Other abnormalities of gait and mobility: Secondary | ICD-10-CM

## 2023-05-24 DIAGNOSIS — M6281 Muscle weakness (generalized): Secondary | ICD-10-CM

## 2023-05-26 ENCOUNTER — Ambulatory Visit: Payer: Medicare Other

## 2023-05-26 DIAGNOSIS — R293 Abnormal posture: Secondary | ICD-10-CM

## 2023-05-26 DIAGNOSIS — R2681 Unsteadiness on feet: Secondary | ICD-10-CM | POA: Diagnosis not present

## 2023-05-26 DIAGNOSIS — R2689 Other abnormalities of gait and mobility: Secondary | ICD-10-CM

## 2023-05-26 DIAGNOSIS — R29818 Other symptoms and signs involving the nervous system: Secondary | ICD-10-CM

## 2023-05-26 DIAGNOSIS — M6281 Muscle weakness (generalized): Secondary | ICD-10-CM

## 2023-05-26 NOTE — Therapy (Signed)
OUTPATIENT PHYSICAL THERAPY NEURO TREATMENT   Patient Name: Barbara Krause MRN: [id number] DOB:July 22, 1941, 82 y.o., female Today's Date: 05/26/2023   PCP: Excell Seltzer, MD REFERRING PROVIDER: Excell Seltzer, MD  END OF SESSION:  PT End of Session - 05/26/23 1405     Visit Number 5    Number of Visits 8    Date for PT Re-Evaluation 06/21/23    Authorization Type Medicare/BCBS    Progress Note Due on Visit 10    PT Start Time 1400    PT Stop Time 1445    PT Time Calculation (min) 45 min    Equipment Utilized During Treatment Gait belt    Activity Tolerance Patient tolerated treatment well    Behavior During Therapy WFL for tasks assessed/performed               Past Medical History:  Diagnosis Date   Breast cancer (HCC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   Depression    History of chicken pox    History of colon polyps    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HCC)    Kidney stone    Osteoporosis    Past Surgical History:  Procedure Laterality Date   CESAREAN SECTION     1977 and 1979   CHOLECYSTECTOMY     TOTAL MASTECTOMY Bilateral 2009   Patient Active Problem List   Diagnosis Date Noted   Weight loss 02/03/2023   Closed fracture of lower end of right radius with routine healing 11/04/2022   Constipation 10/17/2022   History of CVA (cerebrovascular accident) 10/04/2022   Right thyroid nodule 10/02/2022   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 10/02/2022   Acute CVA (cerebrovascular accident) (HCC) 10/01/2022   Coronary atherosclerosis 08/03/2022   Compression fracture of thoracolumbar vertebra with routine healing 08/03/2022   DNR (do not resuscitate) 08/03/2022   At risk for aspiration 08/03/2022   High cholesterol 08/03/2022   Dementia (HCC) 08/03/2022   Acute midline low back pain without sciatica 03/22/2022   Recurrent falls 11/26/2020   Moderate protein-calorie malnutrition (HCC) 06/30/2020   History of intracranial hemorrhage 06/15/2020   Aortic atherosclerosis  (HCC) 08/30/2019   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HCC) 08/30/2019   Microscopic hematuria 04/02/2019   Osteoporosis 04/02/2019   Vitamin B12 deficiency 04/02/2019   Thoracic aortic aneurysm (TAA) (HCC) 04/02/2019   HTN (hypertension) 03/05/2019   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   Tobacco use 03/05/2019   Depression, major, single episode, complete remission (HCC) 03/05/2019   Hx of breast cancer 03/05/2019   S/P bilateral mastectomy 03/05/2019    ONSET DATE: 03/2019  REFERRING DIAG: G10 (ICD-10-CM) - 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HCC)  THERAPY DIAG:  Unsteadiness on feet  Other abnormalities of gait and mobility  Muscle weakness (generalized)  Other symptoms and signs involving the nervous system  Abnormal posture  Rationale for Evaluation and Treatment: Rehabilitation  SUBJECTIVE:  SUBJECTIVE STATEMENT: I would like to work on strength and balance today  Pt accompanied by:  caregiver: [name]  PERTINENT HISTORY: Huntington's Disease  PAIN:  Are you having pain? No  PRECAUTIONS: Fall  WEIGHT BEARING RESTRICTIONS: No  FALLS: Has patient fallen in last 6 months? No  LIVING ENVIRONMENT: Lives with: lives alone Lives in: House/apartment Stairs:  exterior to enter/exit. Ground-floor set-up Has following equipment at home: Dan Humphreys - 2 wheeled  PLOF: Independent and daughter assists with showering. Caregiver performs cooking and housekeeping  PATIENT GOALS: get stronger  OBJECTIVE:   TODAY'S TREATMENT: 05/26/23 Activity Comments  NU-step level 3 x 8 min Cues for speed for 60-70 SPM  LAQ 3x12 5#  Seated hip flex 2x10 5#  Seated hamstring curls 2x10 red  Seated hip abd 2x10 Red+yellow loops  Sidestepping 2x60 sec at counter Red+yellow loop at knees  Sit-stand 1x10 at counter Red+yellow loop at  knees  Standing balance -passing 3# weight around waist 10x clockwise/counterclockwise -standing on compliant surfaces Eo/EC -kicking ball for weight shift and reactive balance      HOME EXERCISE PROGRAM Last updated: 05/24/23 Access Code: [email or web address] URL: [email or web address] Date: 05/24/2023 Prepared by: [facility] - Outpatient  Rehab - [facility]  Exercises - Standing Balance in Corner with Eyes Closed  - 1 x daily - 7 x weekly - 3 sets - 30 sec hold - Corner Balance Feet Apart: Eyes Closed With Head Turns  - 1 x daily - 7 x weekly - 3 sets - 3-5 reps - Semi-Tandem Corner Balance With Eyes Open  - 1 x daily - 7 x weekly - 3 sets - 15-30 sec hold - Supine Bridge with Resistance Band  - 1 x daily - 7 x weekly - 3 sets - 10 reps - Standing Hip Abduction with Counter Support  - 1 x daily - 5 x weekly - 2 sets - 10 reps - Sit to Stand with Resistance Around Legs  - 1 x daily - 7 x weekly - 3 sets - 5-8 reps - Seated Long Arc Quad  - 1 x daily - 5 x weekly - 2 sets - 10 reps - Seated Hip Abduction  - 1 x daily - 5 x weekly - 2 sets - 10 reps - Seated Hamstring Curl with Anchored Resistance  - 1 x daily - 5 x weekly - 2 sets - 10 reps - Seated Hip Adduction Isometrics with Ball  - 1 x daily - 5 x weekly - 2 sets - 10 reps - 3 sec hold   PATIENT EDUCATION: Education details: HEP update Person educated: Patient and Production assistant, radio method: Explanation, Demonstration, Tactile cues, Verbal cues, and Handouts Education comprehension: verbalized understanding and returned demonstration    Below measures were taken at time of initial evaluation unless otherwise specified:   DIAGNOSTIC FINDINGS:   COGNITION: Overall cognitive status: Within functional limits for tasks assessed   SENSATION: WFL  COORDINATION: Difficulty with rapid alternating Finger to nose WNL Nose to examiner's finger: WNL Heel to shin: WNL    MUSCLE TONE: WNL  MUSCLE  LENGTH:   DTRs:  NT  POSTURE: forward head  LOWER EXTREMITY ROM:     Active  Right Eval Left Eval  Hip flexion    Hip extension    Hip abduction    Hip adduction    Hip internal rotation    Hip external rotation    Knee flexion    Knee extension    Ankle  dorsiflexion 12 12  Ankle plantarflexion    Ankle inversion    Ankle eversion     (Blank rows = not tested)  LOWER EXTREMITY MMT:    MMT Right Eval Left Eval  Hip flexion    Hip extension    Hip abduction 4 3  Hip adduction 5 5  Hip internal rotation 4 4-  Hip external rotation 4 3+  Knee flexion 4 4  Knee extension 4 4  Ankle dorsiflexion 4+ 4+  Ankle plantarflexion    Ankle inversion    Ankle eversion    (Blank rows = not tested)  BED MOBILITY:  Independent   TRANSFERS: Assistive device utilized: Environmental consultant - 2 wheeled  Sit to stand: Complete Independence and Modified independence Stand to sit: Modified independence Chair to chair: Modified independence Floor:  NT    CURB:  Level of Assistance: SBA Assistive device utilized: Environmental consultant - 2 wheeled Curb Comments:   STAIRS: Level of Assistance: Modified independence Stair Negotiation Technique: Step to Pattern with Bilateral Rails Number of Stairs: 12  Height of Stairs: 4-6"  Comments:   GAIT: Gait pattern: wide BOS, some instances of left Trendelenberg Distance walked:  Assistive device utilized: Environmental consultant - 2 wheeled Level of assistance: Modified independence and SBA Comments:   FUNCTIONAL TESTS:  5 times sit to stand: 25 sec--poor eccentric control Timed up and go (TUG): 25 sec Berg Balance Scale: 33/56   TODAY'S TREATMENT:                                                                                                                              DATE: 05/03/23    PATIENT EDUCATION: Education details: assessment details and comparing to past scores Person educated: Patient and Caregiver [name] Education method: Explanation Education  comprehension: verbalized understanding  HOME EXERCISE PROGRAM: [email or web address] (past HEP)  GOALS: Goals reviewed with patient? Yes  SHORT TERM GOALS: Target date: 05/24/2023    Independent with HEP with bias to right hip strength Baseline: Goal status: IN PROGRESS    LONG TERM GOALS: Target date: 06/21/2023      Demo improved balance and reduced risk for falls per score 46/56 Berg Balance Test Baseline: 33/56 Goal status: IN PROGRESS  2.  Improve safety with ambulation per time 20 sec TUG test Baseline: 25 sec w/ RW Goal status: IN PROGRESS  3.  Demo improved BLE strength and balance per time 21 sec 5xSTS Baseline: 25 sec Goal status: IN PROGRESS  4.  Improve left hip abductor strength to 4/5 to improve stability and single limb stance Baseline: 3/5 Goal status: IN PROGRESS    ASSESSMENT:  CLINICAL IMPRESSION: Instructed in open chain activities to improve strength and recruitment for BLE.  Difficulty with closed chain exercises due to knee crepitus and weakness.  Good response for multi-sensory balance activities with mild-moderate sway. Continued sessions to progress POC details  OBJECTIVE IMPAIRMENTS: Abnormal gait, decreased activity tolerance, decreased balance, decreased mobility, difficulty  walking, decreased strength, and postural dysfunction.   ACTIVITY LIMITATIONS: carrying, lifting, standing, reach over head, and locomotion level  PARTICIPATION LIMITATIONS: meal prep, cleaning, and leisure  PERSONAL FACTORS: Age, Time since onset of injury/illness/exacerbation, and 1 comorbidity: Huntington's Dx  are also affecting patient's functional outcome.   REHAB POTENTIAL: Good  CLINICAL DECISION MAKING: Evolving/moderate complexity  EVALUATION COMPLEXITY: Moderate  PLAN:  PT FREQUENCY: 1-2x/week 2x/wk x 2 wks then 1xwk x 4 wks  PT DURATION: 6 weeks  PLANNED INTERVENTIONS: Therapeutic exercises, Therapeutic activity, Neuromuscular re-education, Balance  training, Gait training, Patient/Family education, Self Care, Joint mobilization, Stair training, Vestibular training, Canalith repositioning, Orthotic/Fit training, DME instructions, Aquatic Therapy, Dry Needling, Electrical stimulation, Spinal mobilization, Cryotherapy, Moist heat, Taping, Ionotophoresis 4mg /ml Dexamethasone, and Manual therapy  PLAN FOR NEXT SESSION: open chain strength activities with theraloop for hip/knee strength   3:51 PM, 05/26/23 M. Shary Decamp, PT, DPT Physical Therapist- [place] Office Number: [phone]

## 2023-05-31 ENCOUNTER — Ambulatory Visit: Payer: Medicare Other

## 2023-05-31 ENCOUNTER — Ambulatory Visit: Payer: Medicare Other | Admitting: Physical Therapy

## 2023-05-31 DIAGNOSIS — R29818 Other symptoms and signs involving the nervous system: Secondary | ICD-10-CM

## 2023-05-31 DIAGNOSIS — R293 Abnormal posture: Secondary | ICD-10-CM

## 2023-05-31 DIAGNOSIS — R2681 Unsteadiness on feet: Secondary | ICD-10-CM

## 2023-05-31 DIAGNOSIS — M6281 Muscle weakness (generalized): Secondary | ICD-10-CM

## 2023-05-31 DIAGNOSIS — R2689 Other abnormalities of gait and mobility: Secondary | ICD-10-CM

## 2023-05-31 NOTE — Therapy (Signed)
OUTPATIENT PHYSICAL THERAPY NEURO TREATMENT   Patient Name: Barbara Krause MRN: [id number] DOB:25-Sep-1941, 82 y.o., female Today's Date: 05/31/2023   PCP: Excell Seltzer, MD REFERRING PROVIDER: Excell Seltzer, MD  END OF SESSION:  PT End of Session - 05/31/23 1355     Visit Number 6    Number of Visits 8    Date for PT Re-Evaluation 06/21/23    Authorization Type Medicare/BCBS    Progress Note Due on Visit 10    PT Start Time 1400    PT Stop Time 1445    PT Time Calculation (min) 45 min    Equipment Utilized During Treatment Gait belt    Activity Tolerance Patient tolerated treatment well    Behavior During Therapy WFL for tasks assessed/performed               Past Medical History:  Diagnosis Date   Breast cancer (HCC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   Depression    History of chicken pox    History of colon polyps    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HCC)    Kidney stone    Osteoporosis    Past Surgical History:  Procedure Laterality Date   CESAREAN SECTION     1977 and 1979   CHOLECYSTECTOMY     TOTAL MASTECTOMY Bilateral 2009   Patient Active Problem List   Diagnosis Date Noted   Weight loss 02/03/2023   Closed fracture of lower end of right radius with routine healing 11/04/2022   Constipation 10/17/2022   History of CVA (cerebrovascular accident) 10/04/2022   Right thyroid nodule 10/02/2022   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 10/02/2022   Acute CVA (cerebrovascular accident) (HCC) 10/01/2022   Coronary atherosclerosis 08/03/2022   Compression fracture of thoracolumbar vertebra with routine healing 08/03/2022   DNR (do not resuscitate) 08/03/2022   At risk for aspiration 08/03/2022   High cholesterol 08/03/2022   Dementia (HCC) 08/03/2022   Acute midline low back pain without sciatica 03/22/2022   Recurrent falls 11/26/2020   Moderate protein-calorie malnutrition (HCC) 06/30/2020   History of intracranial hemorrhage 06/15/2020   Aortic atherosclerosis  (HCC) 08/30/2019   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HCC) 08/30/2019   Microscopic hematuria 04/02/2019   Osteoporosis 04/02/2019   Vitamin B12 deficiency 04/02/2019   Thoracic aortic aneurysm (TAA) (HCC) 04/02/2019   HTN (hypertension) 03/05/2019   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   Tobacco use 03/05/2019   Depression, major, single episode, complete remission (HCC) 03/05/2019   Hx of breast cancer 03/05/2019   S/P bilateral mastectomy 03/05/2019    ONSET DATE: 03/2019  REFERRING DIAG: G10 (ICD-10-CM) - 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HCC)  THERAPY DIAG:  Unsteadiness on feet  Other abnormalities of gait and mobility  Muscle weakness (generalized)  Other symptoms and signs involving the nervous system  Abnormal posture  Rationale for Evaluation and Treatment: Rehabilitation  SUBJECTIVE:  SUBJECTIVE STATEMENT: I would like to continue with strength building exercises.  Pt accompanied by:  caregiver: [name]  PERTINENT HISTORY: Huntington's Disease  PAIN:  Are you having pain? No  PRECAUTIONS: Fall  WEIGHT BEARING RESTRICTIONS: No  FALLS: Has patient fallen in last 6 months? No  LIVING ENVIRONMENT: Lives with: lives alone Lives in: House/apartment Stairs:  exterior to enter/exit. Ground-floor set-up Has following equipment at home: Dan Humphreys - 2 wheeled  PLOF: Independent and daughter assists with showering. Caregiver performs cooking and housekeeping  PATIENT GOALS: get stronger  OBJECTIVE:   TODAY'S TREATMENT: 05/31/23 Activity Comments  NU-step level 4 x 8 min CV endurance, cues for SPM 50-60  Sit-stand 2x5 reps 8# goblet, 26" seat height for eccentric control  LAQ 3x10  5#  Hamstring curls 3x10 Yellow+red  Stair taps 3x10 5# 5#  Standing balance -dynamic balance against external  perturbations  -able to tolerate x 1 min and 85 ft     TODAY'S TREATMENT: 05/26/23 Activity Comments  NU-step level 3 x 8 min Cues for speed for 60-70 SPM  LAQ 3x12 5#  Seated hip flex 2x10 5#  Seated hamstring curls 2x10 red  Seated hip abd 2x10 Red+yellow loops  Sidestepping 2x60 sec at counter Red+yellow loop at knees  Sit-stand 1x10 at counter Red+yellow loop at knees  Standing balance -passing 3# weight around waist 10x clockwise/counterclockwise -standing on compliant surfaces Eo/EC -kicking ball for weight shift and reactive balance      HOME EXERCISE PROGRAM Last updated: 05/24/23 Access Code: [email or web address] URL: [email or web address] Date: 05/24/2023 Prepared by: [facility] - Outpatient  Rehab - [facility]  Exercises - Standing Balance in Corner with Eyes Closed  - 1 x daily - 7 x weekly - 3 sets - 30 sec hold - Corner Balance Feet Apart: Eyes Closed With Head Turns  - 1 x daily - 7 x weekly - 3 sets - 3-5 reps - Semi-Tandem Corner Balance With Eyes Open  - 1 x daily - 7 x weekly - 3 sets - 15-30 sec hold - Supine Bridge with Resistance Band  - 1 x daily - 7 x weekly - 3 sets - 10 reps - Standing Hip Abduction with Counter Support  - 1 x daily - 5 x weekly - 2 sets - 10 reps - Sit to Stand with Resistance Around Legs  - 1 x daily - 7 x weekly - 3 sets - 5-8 reps - Seated Long Arc Quad  - 1 x daily - 5 x weekly - 2 sets - 10 reps - Seated Hip Abduction  - 1 x daily - 5 x weekly - 2 sets - 10 reps - Seated Hamstring Curl with Anchored Resistance  - 1 x daily - 5 x weekly - 2 sets - 10 reps - Seated Hip Adduction Isometrics with Ball  - 1 x daily - 5 x weekly - 2 sets - 10 reps - 3 sec hold   PATIENT EDUCATION: Education details: HEP update Person educated: Patient and Production assistant, radio method: Explanation, Demonstration, Tactile cues, Verbal cues, and Handouts Education comprehension: verbalized understanding and returned  demonstration    Below measures were taken at time of initial evaluation unless otherwise specified:   DIAGNOSTIC FINDINGS:   COGNITION: Overall cognitive status: Within functional limits for tasks assessed   SENSATION: WFL  COORDINATION: Difficulty with rapid alternating Finger to nose WNL Nose to examiner's finger: WNL Heel to shin: WNL    MUSCLE TONE: WNL  MUSCLE  LENGTH:   DTRs:  NT  POSTURE: forward head  LOWER EXTREMITY ROM:     Active  Right Eval Left Eval  Hip flexion    Hip extension    Hip abduction    Hip adduction    Hip internal rotation    Hip external rotation    Knee flexion    Knee extension    Ankle dorsiflexion 12 12  Ankle plantarflexion    Ankle inversion    Ankle eversion     (Blank rows = not tested)  LOWER EXTREMITY MMT:    MMT Right Eval Left Eval  Hip flexion    Hip extension    Hip abduction 4 3  Hip adduction 5 5  Hip internal rotation 4 4-  Hip external rotation 4 3+  Knee flexion 4 4  Knee extension 4 4  Ankle dorsiflexion 4+ 4+  Ankle plantarflexion    Ankle inversion    Ankle eversion    (Blank rows = not tested)  BED MOBILITY:  Independent   TRANSFERS: Assistive device utilized: Environmental consultant - 2 wheeled  Sit to stand: Complete Independence and Modified independence Stand to sit: Modified independence Chair to chair: Modified independence Floor:  NT    CURB:  Level of Assistance: SBA Assistive device utilized: Environmental consultant - 2 wheeled Curb Comments:   STAIRS: Level of Assistance: Modified independence Stair Negotiation Technique: Step to Pattern with Bilateral Rails Number of Stairs: 12  Height of Stairs: 4-6"  Comments:   GAIT: Gait pattern: wide BOS, some instances of left Trendelenberg Distance walked:  Assistive device utilized: Environmental consultant - 2 wheeled Level of assistance: Modified independence and SBA Comments:   FUNCTIONAL TESTS:  5 times sit to stand: 25 sec--poor eccentric control Timed up  and go (TUG): 25 sec Berg Balance Scale: 33/56   TODAY'S TREATMENT:                                                                                                                              DATE: 05/03/23    PATIENT EDUCATION: Education details: assessment details and comparing to past scores Person educated: Patient and Caregiver [name] Education method: Explanation Education comprehension: verbalized understanding  HOME EXERCISE PROGRAM: [email or web address] (past HEP)  GOALS: Goals reviewed with patient? Yes  SHORT TERM GOALS: Target date: 05/24/2023    Independent with HEP with bias to right hip strength Baseline: Goal status: IN PROGRESS    LONG TERM GOALS: Target date: 06/21/2023      Demo improved balance and reduced risk for falls per score 46/56 Berg Balance Test Baseline: 33/56 Goal status: IN PROGRESS  2.  Improve safety with ambulation per time 20 sec TUG test Baseline: 25 sec w/ RW Goal status: IN PROGRESS  3.  Demo improved BLE strength and balance per time 21 sec 5xSTS Baseline: 25 sec Goal status: IN PROGRESS  4.  Improve left hip abductor strength to 4/5 to improve stability and single  limb stance Baseline: 3/5 Goal status: IN PROGRESS    ASSESSMENT:  CLINICAL IMPRESSION: Continued with training to improve general strength and activity tolerance to improve mobility.  Dynamic balance activities to improve postural stability against external demands. Able to demo Fair dynamic standing balance throughou session. Continued sessions to improve LE strength for eccentric control to reduce instances of falling during transfers  OBJECTIVE IMPAIRMENTS: Abnormal gait, decreased activity tolerance, decreased balance, decreased mobility, difficulty walking, decreased strength, and postural dysfunction.   ACTIVITY LIMITATIONS: carrying, lifting, standing, reach over head, and locomotion level  PARTICIPATION LIMITATIONS: meal prep, cleaning, and leisure  PERSONAL  FACTORS: Age, Time since onset of injury/illness/exacerbation, and 1 comorbidity: Huntington's Dx  are also affecting patient's functional outcome.   REHAB POTENTIAL: Good  CLINICAL DECISION MAKING: Evolving/moderate complexity  EVALUATION COMPLEXITY: Moderate  PLAN:  PT FREQUENCY: 1-2x/week 2x/wk x 2 wks then 1xwk x 4 wks  PT DURATION: 6 weeks  PLANNED INTERVENTIONS: Therapeutic exercises, Therapeutic activity, Neuromuscular re-education, Balance training, Gait training, Patient/Family education, Self Care, Joint mobilization, Stair training, Vestibular training, Canalith repositioning, Orthotic/Fit training, DME instructions, Aquatic Therapy, Dry Needling, Electrical stimulation, Spinal mobilization, Cryotherapy, Moist heat, Taping, Ionotophoresis 4mg /ml Dexamethasone, and Manual therapy  PLAN FOR NEXT SESSION: open chain strength activities with theraloop for hip/knee strength   1:55 PM, 05/31/23 M. Shary Decamp, PT, DPT Physical Therapist- [place] Office Number: [phone]

## 2023-06-02 ENCOUNTER — Ambulatory Visit: Payer: Medicare Other | Attending: Family Medicine

## 2023-06-02 DIAGNOSIS — R2681 Unsteadiness on feet: Secondary | ICD-10-CM | POA: Insufficient documentation

## 2023-06-02 DIAGNOSIS — M6281 Muscle weakness (generalized): Secondary | ICD-10-CM | POA: Insufficient documentation

## 2023-06-02 DIAGNOSIS — R29818 Other symptoms and signs involving the nervous system: Secondary | ICD-10-CM | POA: Diagnosis present

## 2023-06-02 DIAGNOSIS — R293 Abnormal posture: Secondary | ICD-10-CM | POA: Insufficient documentation

## 2023-06-02 DIAGNOSIS — R2689 Other abnormalities of gait and mobility: Secondary | ICD-10-CM | POA: Insufficient documentation

## 2023-06-02 NOTE — Therapy (Signed)
OUTPATIENT PHYSICAL THERAPY NEURO TREATMENT   Patient Name: Barbara Krause MRN: [id number] DOB:Jun 22, 1941, 82 y.o., female Today's Date: 06/02/2023   PCP: Excell Seltzer, MD REFERRING PROVIDER: Excell Seltzer, MD  END OF SESSION:  PT End of Session - 06/02/23 1319     Visit Number 7    Number of Visits 8    Date for PT Re-Evaluation 06/21/23    Authorization Type Medicare/BCBS    Progress Note Due on Visit 10    PT Start Time 1320    PT Stop Time 1405    PT Time Calculation (min) 45 min    Equipment Utilized During Treatment Gait belt    Activity Tolerance Patient tolerated treatment well    Behavior During Therapy WFL for tasks assessed/performed               Past Medical History:  Diagnosis Date   Breast cancer (HCC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   Depression    History of chicken pox    History of colon polyps    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HCC)    Kidney stone    Osteoporosis    Past Surgical History:  Procedure Laterality Date   CESAREAN SECTION     1977 and 1979   CHOLECYSTECTOMY     TOTAL MASTECTOMY Bilateral 2009   Patient Active Problem List   Diagnosis Date Noted   Weight loss 02/03/2023   Closed fracture of lower end of right radius with routine healing 11/04/2022   Constipation 10/17/2022   History of CVA (cerebrovascular accident) 10/04/2022   Right thyroid nodule 10/02/2022   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 10/02/2022   Acute CVA (cerebrovascular accident) (HCC) 10/01/2022   Coronary atherosclerosis 08/03/2022   Compression fracture of thoracolumbar vertebra with routine healing 08/03/2022   DNR (do not resuscitate) 08/03/2022   At risk for aspiration 08/03/2022   High cholesterol 08/03/2022   Dementia (HCC) 08/03/2022   Acute midline low back pain without sciatica 03/22/2022   Recurrent falls 11/26/2020   Moderate protein-calorie malnutrition (HCC) 06/30/2020   History of intracranial hemorrhage 06/15/2020   Aortic atherosclerosis (HCC)  08/30/2019   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HCC) 08/30/2019   Microscopic hematuria 04/02/2019   Osteoporosis 04/02/2019   Vitamin B12 deficiency 04/02/2019   Thoracic aortic aneurysm (TAA) (HCC) 04/02/2019   HTN (hypertension) 03/05/2019   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   Tobacco use 03/05/2019   Depression, major, single episode, complete remission (HCC) 03/05/2019   Hx of breast cancer 03/05/2019   S/P bilateral mastectomy 03/05/2019    ONSET DATE: 03/2019  REFERRING DIAG: G10 (ICD-10-CM) - 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HCC)  THERAPY DIAG:  Unsteadiness on feet  Other abnormalities of gait and mobility  Muscle weakness (generalized)  Other symptoms and signs involving the nervous system  Abnormal posture  Rationale for Evaluation and Treatment: Rehabilitation  SUBJECTIVE:  SUBJECTIVE STATEMENT: Doing well, no soreness from last session  Pt accompanied by:  caregiver: [name]  PERTINENT HISTORY: Huntington's Disease  PAIN:  Are you having pain? No  PRECAUTIONS: Fall  WEIGHT BEARING RESTRICTIONS: No  FALLS: Has patient fallen in last 6 months? No  LIVING ENVIRONMENT: Lives with: lives alone Lives in: House/apartment Stairs:  exterior to enter/exit. Ground-floor set-up Has following equipment at home: Dan Humphreys - 2 wheeled  PLOF: Independent and daughter assists with showering. Caregiver performs cooking and housekeeping  PATIENT GOALS: get stronger  OBJECTIVE:   TODAY'S TREATMENT: 06/02/23 Activity Comments  NU-step x 8 min Resistance intervals 30 sec heavy 30 sec light  LAQ 3x10 8#  Stair taps 3x10 8# 6" BUE support  Seated hamstring curls 3x10 10#  Seated rows 2x10 15#  Corner balance Multi-sensory      HOME EXERCISE PROGRAM Last updated: 05/24/23 Access Code: [email or web address] URL:  [email or web address] Date: 05/24/2023 Prepared by: [facility] - Outpatient  Rehab - [facility]  Exercises - Standing Balance in Corner with Eyes Closed  - 1 x daily - 7 x weekly - 3 sets - 30 sec hold - Corner Balance Feet Apart: Eyes Closed With Head Turns  - 1 x daily - 7 x weekly - 3 sets - 3-5 reps - Semi-Tandem Corner Balance With Eyes Open  - 1 x daily - 7 x weekly - 3 sets - 15-30 sec hold - Supine Bridge with Resistance Band  - 1 x daily - 7 x weekly - 3 sets - 10 reps - Standing Hip Abduction with Counter Support  - 1 x daily - 5 x weekly - 2 sets - 10 reps - Sit to Stand with Resistance Around Legs  - 1 x daily - 7 x weekly - 3 sets - 5-8 reps - Seated Long Arc Quad  - 1 x daily - 5 x weekly - 2 sets - 10 reps - Seated Hip Abduction  - 1 x daily - 5 x weekly - 2 sets - 10 reps - Seated Hamstring Curl with Anchored Resistance  - 1 x daily - 5 x weekly - 2 sets - 10 reps - Seated Hip Adduction Isometrics with Ball  - 1 x daily - 5 x weekly - 2 sets - 10 reps - 3 sec hold   PATIENT EDUCATION: Education details: HEP update Person educated: Patient and Production assistant, radio method: Explanation, Demonstration, Tactile cues, Verbal cues, and Handouts Education comprehension: verbalized understanding and returned demonstration    Below measures were taken at time of initial evaluation unless otherwise specified:   DIAGNOSTIC FINDINGS:   COGNITION: Overall cognitive status: Within functional limits for tasks assessed   SENSATION: WFL  COORDINATION: Difficulty with rapid alternating Finger to nose WNL Nose to examiner's finger: WNL Heel to shin: WNL    MUSCLE TONE: WNL  MUSCLE LENGTH:   DTRs:  NT  POSTURE: forward head  LOWER EXTREMITY ROM:     Active  Right Eval Left Eval  Hip flexion    Hip extension    Hip abduction    Hip adduction    Hip internal rotation    Hip external rotation    Knee flexion    Knee extension    Ankle  dorsiflexion 12 12  Ankle plantarflexion    Ankle inversion    Ankle eversion     (Blank rows = not tested)  LOWER EXTREMITY MMT:    MMT Right Eval Left Eval  Hip flexion  Hip extension    Hip abduction 4 3  Hip adduction 5 5  Hip internal rotation 4 4-  Hip external rotation 4 3+  Knee flexion 4 4  Knee extension 4 4  Ankle dorsiflexion 4+ 4+  Ankle plantarflexion    Ankle inversion    Ankle eversion    (Blank rows = not tested)  BED MOBILITY:  Independent   TRANSFERS: Assistive device utilized: Environmental consultant - 2 wheeled  Sit to stand: Complete Independence and Modified independence Stand to sit: Modified independence Chair to chair: Modified independence Floor:  NT    CURB:  Level of Assistance: SBA Assistive device utilized: Environmental consultant - 2 wheeled Curb Comments:   STAIRS: Level of Assistance: Modified independence Stair Negotiation Technique: Step to Pattern with Bilateral Rails Number of Stairs: 12  Height of Stairs: 4-6"  Comments:   GAIT: Gait pattern: wide BOS, some instances of left Trendelenberg Distance walked:  Assistive device utilized: Environmental consultant - 2 wheeled Level of assistance: Modified independence and SBA Comments:   FUNCTIONAL TESTS:  5 times sit to stand: 25 sec--poor eccentric control Timed up and go (TUG): 25 sec Berg Balance Scale: 33/56   TODAY'S TREATMENT:                                                                                                                              DATE: 05/03/23    PATIENT EDUCATION: Education details: assessment details and comparing to past scores Person educated: Patient and Caregiver [name] Education method: Explanation Education comprehension: verbalized understanding  HOME EXERCISE PROGRAM: [id number] (past HEP)  GOALS: Goals reviewed with patient? Yes  SHORT TERM GOALS: Target date: 05/24/2023    Independent with HEP with bias to right hip strength Baseline: Goal status: MET    LONG TERM  GOALS: Target date: 06/21/2023      Demo improved balance and reduced risk for falls per score 46/56 Berg Balance Test Baseline: 33/56 Goal status: IN PROGRESS  2.  Improve safety with ambulation per time 20 sec TUG test Baseline: 25 sec w/ RW Goal status: IN PROGRESS  3.  Demo improved BLE strength and balance per time 21 sec 5xSTS Baseline: 25 sec Goal status: IN PROGRESS  4.  Improve left hip abductor strength to 4/5 to improve stability and single limb stance Baseline: 3/5 Goal status: IN PROGRESS    ASSESSMENT:  CLINICAL IMPRESSION: Continued with activities with emphasis on increased resistance in all facets today for greater strength development with greater emphasis on open chain movements for isolation to enhance control.  Tolerating very well with minimal rest periods required between sets in seated position.  Balance activities performed under various conditions to improve stability and safety with unsupported standing with emphasis on head turns to reduce risk for falls.  Increased difficulty with stability w/ narrow BOS requiring CGA for correction  OBJECTIVE IMPAIRMENTS: Abnormal gait, decreased activity tolerance, decreased balance, decreased mobility, difficulty walking, decreased strength, and postural  dysfunction.   ACTIVITY LIMITATIONS: carrying, lifting, standing, reach over head, and locomotion level  PARTICIPATION LIMITATIONS: meal prep, cleaning, and leisure  PERSONAL FACTORS: Age, Time since onset of injury/illness/exacerbation, and 1 comorbidity: Huntington's Dx  are also affecting patient's functional outcome.   REHAB POTENTIAL: Good  CLINICAL DECISION MAKING: Evolving/moderate complexity  EVALUATION COMPLEXITY: Moderate  PLAN:  PT FREQUENCY: 1-2x/week 2x/wk x 2 wks then 1xwk x 4 wks  PT DURATION: 6 weeks  PLANNED INTERVENTIONS: Therapeutic exercises, Therapeutic activity, Neuromuscular re-education, Balance training, Gait training,  Patient/Family education, Self Care, Joint mobilization, Stair training, Vestibular training, Canalith repositioning, Orthotic/Fit training, DME instructions, Aquatic Therapy, Dry Needling, Electrical stimulation, Spinal mobilization, Cryotherapy, Moist heat, Taping, Ionotophoresis 4mg /ml Dexamethasone, and Manual therapy  PLAN FOR NEXT SESSION: open chain strength activities with theraloop for hip/knee strength   1:19 PM, 06/02/23 M. Shary Decamp, PT, DPT Physical Therapist- [place] Office Number: [phone]

## 2023-06-04 ENCOUNTER — Other Ambulatory Visit: Payer: Self-pay | Admitting: Family Medicine

## 2023-06-06 ENCOUNTER — Other Ambulatory Visit: Payer: Self-pay | Admitting: *Deleted

## 2023-06-06 NOTE — Telephone Encounter (Signed)
Last office visit 02/03/23 for CPE.  Last refilled 04/27/2023 for #30 with no refills.  Next Appt: Next Appt: No future appointment with PCP.

## 2023-06-06 NOTE — Telephone Encounter (Signed)
LMOV to schedule  

## 2023-06-07 ENCOUNTER — Ambulatory Visit: Payer: Medicare Other

## 2023-06-07 DIAGNOSIS — R29818 Other symptoms and signs involving the nervous system: Secondary | ICD-10-CM

## 2023-06-07 DIAGNOSIS — R2681 Unsteadiness on feet: Secondary | ICD-10-CM

## 2023-06-07 DIAGNOSIS — R2689 Other abnormalities of gait and mobility: Secondary | ICD-10-CM

## 2023-06-07 DIAGNOSIS — R293 Abnormal posture: Secondary | ICD-10-CM

## 2023-06-07 DIAGNOSIS — M6281 Muscle weakness (generalized): Secondary | ICD-10-CM

## 2023-06-07 NOTE — Therapy (Signed)
OUTPATIENT PHYSICAL THERAPY NEURO TREATMENT and D/C Summary   Patient Name: Barbara Krause MRN: [id number] DOB:1941/04/02, 82 y.o., female Today's Date: 06/07/2023   PCP: Excell Seltzer, MD REFERRING PROVIDER: Excell Seltzer, MD  PHYSICAL THERAPY DISCHARGE SUMMARY  Visits from Start of Care: 8  Current functional level related to goals / functional outcomes: Progressed under current POC with improved scores on Berg Balance Test and 5xSTS. See below for details   Remaining deficits: Mobility impairments, dependence on AD for safe transfers and gait, high risk for falls. Instances of tardive dyskinesia affecting balance/mobility   Education / Equipment: HEP   Patient agrees to discharge. Patient goals were partially met. Patient is being discharged due to being pleased with the current functional level.  END OF SESSION:  PT End of Session - 06/07/23 1314     Visit Number 8    Number of Visits 8    Date for PT Re-Evaluation 06/21/23    Authorization Type Medicare/BCBS    Progress Note Due on Visit 10    PT Start Time 1315    PT Stop Time 1400    PT Time Calculation (min) 45 min    Equipment Utilized During Treatment Gait belt    Activity Tolerance Patient tolerated treatment well    Behavior During Therapy WFL for tasks assessed/performed               Past Medical History:  Diagnosis Date   Breast cancer (HCC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   Depression    History of chicken pox    History of colon polyps    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HCC)    Kidney stone    Osteoporosis    Past Surgical History:  Procedure Laterality Date   CESAREAN SECTION     1977 and 1979   CHOLECYSTECTOMY     TOTAL MASTECTOMY Bilateral 2009   Patient Active Problem List   Diagnosis Date Noted   Weight loss 02/03/2023   Closed fracture of lower end of right radius with routine healing 11/04/2022   Constipation 10/17/2022   History of CVA (cerebrovascular accident) 10/04/2022   Right thyroid nodule  10/02/2022   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 10/02/2022   Acute CVA (cerebrovascular accident) (HCC) 10/01/2022   Coronary atherosclerosis 08/03/2022   Compression fracture of thoracolumbar vertebra with routine healing 08/03/2022   DNR (do not resuscitate) 08/03/2022   At risk for aspiration 08/03/2022   High cholesterol 08/03/2022   Dementia (HCC) 08/03/2022   Acute midline low back pain without sciatica 03/22/2022   Recurrent falls 11/26/2020   Moderate protein-calorie malnutrition (HCC) 06/30/2020   History of intracranial hemorrhage 06/15/2020   Aortic atherosclerosis (HCC) 08/30/2019   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HCC) 08/30/2019   Microscopic hematuria 04/02/2019   Osteoporosis 04/02/2019   Vitamin B12 deficiency 04/02/2019   Thoracic aortic aneurysm (TAA) (HCC) 04/02/2019   HTN (hypertension) 03/05/2019   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   Tobacco use 03/05/2019   Depression, major, single episode, complete remission (HCC) 03/05/2019   Hx of breast cancer 03/05/2019   S/P bilateral mastectomy 03/05/2019    ONSET DATE: 03/2019  REFERRING DIAG: G10 (ICD-10-CM) - 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HCC)  THERAPY DIAG:  Unsteadiness on feet  Other abnormalities of gait and mobility  Muscle weakness (generalized)  Other symptoms and signs involving the nervous system  Abnormal posture  Rationale for Evaluation and Treatment: Rehabilitation  SUBJECTIVE:  SUBJECTIVE STATEMENT: Good visit with family.   Pt accompanied by:  caregiver: [name]  PERTINENT HISTORY: Huntington's Disease  PAIN:  Are you having pain? No  PRECAUTIONS: Fall  WEIGHT BEARING RESTRICTIONS: No  FALLS: Has patient fallen in last 6 months? No  LIVING ENVIRONMENT: Lives with: lives alone Lives in:  House/apartment Stairs:  exterior to enter/exit. Ground-floor set-up Has following equipment at home: Dan Humphreys - 2 wheeled  PLOF: Independent and daughter assists with showering. Caregiver performs cooking and housekeeping  PATIENT GOALS: get stronger  OBJECTIVE:   TODAY'S TREATMENT: 06/07/23 Activity Comments  POC review/ outcome measures performed/assessed   NU-step level 5 x 8 min for CV endurance                    HOME EXERCISE PROGRAM Last updated: 05/24/23 Access Code: [email or web address] URL: [email or web address] Date: 05/24/2023 Prepared by: [facility] - Outpatient  Rehab - [facility]  Exercises - Standing Balance in Corner with Eyes Closed  - 1 x daily - 7 x weekly - 3 sets - 30 sec hold - Corner Balance Feet Apart: Eyes Closed With Head Turns  - 1 x daily - 7 x weekly - 3 sets - 3-5 reps - Semi-Tandem Corner Balance With Eyes Open  - 1 x daily - 7 x weekly - 3 sets - 15-30 sec hold - Supine Bridge with Resistance Band  - 1 x daily - 7 x weekly - 3 sets - 10 reps - Standing Hip Abduction with Counter Support  - 1 x daily - 5 x weekly - 2 sets - 10 reps - Sit to Stand with Resistance Around Legs  - 1 x daily - 7 x weekly - 3 sets - 5-8 reps - Seated Long Arc Quad  - 1 x daily - 5 x weekly - 2 sets - 10 reps - Seated Hip Abduction  - 1 x daily - 5 x weekly - 2 sets - 10 reps - Seated Hamstring Curl with Anchored Resistance  - 1 x daily - 5 x weekly - 2 sets - 10 reps - Seated Hip Adduction Isometrics with Ball  - 1 x daily - 5 x weekly - 2 sets - 10 reps - 3 sec hold   PATIENT EDUCATION: Education details: POC review, discussion regarding STG/LTG and outcome measures.  Recommendation of f/u screen in 2 months for assessment Person educated: Patient and Caregiver [name] Education method: Explanation, Demonstration, Tactile cues, Verbal cues, and Handouts Education comprehension: verbalized understanding and returned demonstration    Below measures were taken  at time of initial evaluation unless otherwise specified:   DIAGNOSTIC FINDINGS:   COGNITION: Overall cognitive status: Within functional limits for tasks assessed   SENSATION: WFL  COORDINATION: Difficulty with rapid alternating Finger to nose WNL Nose to examiner's finger: WNL Heel to shin: WNL    MUSCLE TONE: WNL  MUSCLE LENGTH:   DTRs:  NT  POSTURE: forward head  LOWER EXTREMITY ROM:     Active  Right Eval Left Eval  Hip flexion    Hip extension    Hip abduction    Hip adduction    Hip internal rotation    Hip external rotation    Knee flexion    Knee extension    Ankle dorsiflexion 12 12  Ankle plantarflexion    Ankle inversion    Ankle eversion     (Blank rows = not tested)  LOWER EXTREMITY MMT:    MMT Right Eval Left  Eval  Hip flexion    Hip extension    Hip abduction 4 3  Hip adduction 5 5  Hip internal rotation 4 4-  Hip external rotation 4 3+  Knee flexion 4 4  Knee extension 4 4  Ankle dorsiflexion 4+ 4+  Ankle plantarflexion    Ankle inversion    Ankle eversion    (Blank rows = not tested)  BED MOBILITY:  Independent   TRANSFERS: Assistive device utilized: Environmental consultant - 2 wheeled  Sit to stand: Complete Independence and Modified independence Stand to sit: Modified independence Chair to chair: Modified independence Floor:  NT    CURB:  Level of Assistance: SBA Assistive device utilized: Environmental consultant - 2 wheeled Curb Comments:   STAIRS: Level of Assistance: Modified independence Stair Negotiation Technique: Step to Pattern with Bilateral Rails Number of Stairs: 12  Height of Stairs: 4-6"  Comments:   GAIT: Gait pattern: wide BOS, some instances of left Trendelenberg Distance walked:  Assistive device utilized: Environmental consultant - 2 wheeled Level of assistance: Modified independence and SBA Comments:   FUNCTIONAL TESTS:  5 times sit to stand: 25 sec--poor eccentric control Timed up and go (TUG): 25 sec Berg Balance Scale:  33/56     HOME EXERCISE PROGRAM: [id number] (past HEP)  GOALS: Goals reviewed with patient? Yes  SHORT TERM GOALS: Target date: 05/24/2023    Independent with HEP with bias to right hip strength Baseline: Goal status: MET    LONG TERM GOALS: Target date: 06/21/2023      Demo improved balance and reduced risk for falls per score 46/56 Berg Balance Test Baseline: 33/56; (06/07/23) 44/56 Goal status: NOT MET  2.  Improve safety with ambulation per time 20 sec TUG test Baseline: 25 sec w/ RW; (06/07/23) 25 sec Goal status: NOT MET  3.  Demo improved BLE strength and balance per time 21 sec 5xSTS Baseline: 25 sec; (06/07/23) 13.5 sec w/ moderate use of hands concentric/eccentric Goal status: PARTIALLY MET  4.  Improve left hip abductor strength to 4/5 to improve stability and single limb stance Baseline: 3/5; (06/07/23) 4/5 Goal status: MET    ASSESSMENT:  CLINICAL IMPRESSION: Review of POC details and performance of 5xSTS test with improved concentric and eccentric control with minimal-moderate use of BUE vs start of care requiring extensive use of UE for transfers and absent eccentric control.  Improved Berg Balance Test from initial 33/56 to 44/56 and marked improvement in left hip abduction strength with ability to perform unsupported single leg stance x 10 sec vs previous inability.  Discussion with patient and caregiver regarding continued HEP performance and benefit of mobility screen in two months.   OBJECTIVE IMPAIRMENTS: Abnormal gait, decreased activity tolerance, decreased balance, decreased mobility, difficulty walking, decreased strength, and postural dysfunction.   ACTIVITY LIMITATIONS: carrying, lifting, standing, reach over head, and locomotion level  PARTICIPATION LIMITATIONS: meal prep, cleaning, and leisure  PERSONAL FACTORS: Age, Time since onset of injury/illness/exacerbation, and 1 comorbidity: Huntington's Dx  are also affecting patient's functional  outcome.   REHAB POTENTIAL: Good  CLINICAL DECISION MAKING: Evolving/moderate complexity  EVALUATION COMPLEXITY: Moderate  PLAN:  PT FREQUENCY: 1-2x/week 2x/wk x 2 wks then 1xwk x 4 wks  PT DURATION: 6 weeks  PLANNED INTERVENTIONS: Therapeutic exercises, Therapeutic activity, Neuromuscular re-education, Balance training, Gait training, Patient/Family education, Self Care, Joint mobilization, Stair training, Vestibular training, Canalith repositioning, Orthotic/Fit training, DME instructions, Aquatic Therapy, Dry Needling, Electrical stimulation, Spinal mobilization, Cryotherapy, Moist heat, Taping, Ionotophoresis 4mg /ml Dexamethasone, and Manual therapy  PLAN FOR NEXT SESSION: Return in 2 months for mobility screen   1:15 PM, 06/07/23 M. Shary Decamp, PT, DPT Physical Therapist- [place] Office Number: [phone]

## 2023-06-09 ENCOUNTER — Ambulatory Visit: Payer: Medicare Other | Admitting: Physical Therapy

## 2023-06-14 ENCOUNTER — Ambulatory Visit: Payer: Medicare Other

## 2023-08-02 ENCOUNTER — Encounter: Payer: Self-pay | Admitting: Family Medicine

## 2023-08-02 ENCOUNTER — Telehealth: Payer: Medicare Other | Admitting: Family Medicine

## 2023-08-02 VITALS — BP 126/84 | HR 63 | Temp 97.5°F | Ht 66.0 in | Wt 116.6 lb

## 2023-08-02 DIAGNOSIS — I10 Essential (primary) hypertension: Secondary | ICD-10-CM

## 2023-08-02 DIAGNOSIS — Z111 Encounter for screening for respiratory tuberculosis: Secondary | ICD-10-CM

## 2023-08-02 DIAGNOSIS — G10 Huntington's disease: Secondary | ICD-10-CM | POA: Diagnosis not present

## 2023-08-02 DIAGNOSIS — Z029 Encounter for administrative examinations, unspecified: Secondary | ICD-10-CM | POA: Diagnosis not present

## 2023-08-02 DIAGNOSIS — Z23 Encounter for immunization: Secondary | ICD-10-CM

## 2023-08-02 NOTE — Assessment & Plan Note (Signed)
Stable, chronic.  Continue current medication.   Continue with losartan 25mg  daily

## 2023-08-02 NOTE — Progress Notes (Signed)
VIRTUAL VISIT A virtual visit is felt to be most appropriate for this patient at this time.   I connected with the patient on 08/02/23 at  2:40 PM EDT by virtual telehealth platform and verified that I am speaking with the correct person using two identifiers.   I discussed the limitations, risks, security and privacy concerns of performing an evaluation and management service by  virtual telehealth platform and the availability of in person appointments. I also discussed with the patient that there may be a patient responsible charge related to this service. The patient expressed understanding and agreed to proceed.  Patient location: [facility]  Provider Location: Home  Participants: Sidra Oldfield Ermalene Searing and Warren Lacy   Chief Complaint  Patient presents with   Medical Management of Chronic Issues    discuss paperwork being filled out, tubeculosis test, patient states want the flu shot      History of Present Illness:  82 y.o. female patient of Barbara Stettner E, MD with history of Huntington's disease, dementia, COPD, moderate protein calorie malnutrition and history of stroke presents to discuss paperwork completion for  assisted living.  She presents today with her daughter to discuss paperwork to be completed for  Assisted Living.  Able to feed self, need food cut into small pieces..  heart healthy diet.  Mobile with walker and wheelchair.  Needs assistance for bathing. Able to toilet with no issue... no need for bedside commode. Wears pad for urine occ. NO stool incontinence.  Able to communicate.  Wearing glasses, no hearing aides.  Passive participant in acitivities.  She needs to have tuberculosis testing performed   COVID 19 screen No recent travel or known exposure to COVID19 The patient denies respiratory symptoms of COVID 19 at this time.  The importance of social distancing was discussed today.   Review of Systems  Constitutional:  Negative for chills and  fever.  HENT:  Negative for congestion and ear pain.   Eyes:  Negative for pain and redness.  Respiratory:  Negative for cough and shortness of breath.   Cardiovascular:  Negative for chest pain, palpitations and leg swelling.  Gastrointestinal:  Negative for abdominal pain, blood in stool, constipation, diarrhea, nausea and vomiting.  Genitourinary:  Negative for dysuria.  Musculoskeletal:  Negative for falls and myalgias.  Skin:  Negative for rash.  Neurological:  Negative for dizziness.  Psychiatric/Behavioral:  Negative for depression. The patient is not nervous/anxious.       Past Medical History:  Diagnosis Date   Breast cancer (HCC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   Depression    History of chicken pox    History of colon polyps    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HCC)    Kidney stone    Osteoporosis     reports that she has quit smoking. Her smoking use included cigarettes. She has a 37.5 pack-year smoking history. She has never used smokeless tobacco. She reports current alcohol use. She reports that she does not use drugs.   Current Outpatient Medications:    alendronate (FOSAMAX) 70 MG tablet, TAKE 1 TABLET (70 MG TOTAL) BY MOUTH EVERY 7 DAYS WITH FULL GLASS WATER ON EMPTY STOMACH, Disp: 12 tablet, Rfl: 3   aspirin EC 81 MG tablet, Take 1 tablet (81 mg total) by mouth daily. Swallow whole., Disp: 30 tablet, Rfl: 12   atorvastatin (LIPITOR) 40 MG tablet, Take 1 tablet (40 mg total) by mouth daily., Disp: 90 tablet, Rfl: 2   Deutetrabenazine (AUSTEDO) 12 MG TABS,  Take 1 tablet by mouth daily in the morning,  and 2 tablets by mouth at bedtime, Disp: 90 tablet, Rfl: 0   donepezil (ARICEPT) 5 MG tablet, TAKE 1 TABLET (5 MG TOTAL) BY MOUTH DAILY., Disp: 90 tablet, Rfl: 3   escitalopram (LEXAPRO) 20 MG tablet, TAKE 1 TABLET BY MOUTH EVERY DAY, Disp: 90 tablet, Rfl: 1   losartan (COZAAR) 25 MG tablet, TAKE 2 TABLETS BY MOUTH EVERY DAY, Disp: 180 tablet, Rfl: 1   Multiple Vitamins-Minerals (ALIVE  MULTI-VITAMIN PO), Take 1 tablet by mouth daily., Disp: , Rfl:    scopolamine (TRANSDERM-SCOP) 1 MG/3DAYS, Place 1 patch (1.5 mg total) onto the skin every 3 (three) days., Disp: 10 patch, Rfl: 5   Observations/Objective: Height 5\' 6"  (1.676 m), weight 116 lb 9.6 oz (52.9 kg).   BP Readings from Last 3 Encounters:  08/02/23 126/84  02/03/23 124/82  12/15/22 126/78   Flowsheet Row Video Visit from 08/02/2023 in [facility]  PHQ-2 Total Score 3        Physical Exam Constitutional:      General: The patient is not in acute distress. Pulmonary:     Effort: Pulmonary effort is normal. No respiratory distress.  Neurological:     Mental Status: The patient is alert and oriented to person, place, and time.  Psychiatric:        Mood and Affect: Mood normal.        Behavior: Behavior normal.    Assessment and Plan Administrative encounter Assessment & Plan: Patient looking into assisted living.  Information gathered regarding her ADL status.  Will complete [place] 2 form and additional assisted living forms in detail.   Screening for tuberculosis -     QuantiFERON-TB Gold Plus  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HCC)  Primary hypertension Assessment & Plan: Stable, chronic.  Continue current medication.   Continue with losartan 25mg  daily        I discussed the assessment and treatment plan with the patient. The patient was provided an opportunity to ask questions and all were answered. The patient agreed with the plan and demonstrated an understanding of the instructions.   The patient was advised to call back or seek an in-person evaluation if the symptoms worsen or if the condition fails to improve as anticipated.     Kerby Nora, MD

## 2023-08-02 NOTE — Assessment & Plan Note (Signed)
Patient looking into assisted living.  Information gathered regarding her ADL status.  Will complete FL 2 form and additional assisted living forms in detail.

## 2023-08-02 NOTE — Addendum Note (Signed)
Addended by: Aundra Millet on: 08/02/2023 03:27 PM   Modules accepted: Orders

## 2023-08-04 LAB — QUANTIFERON-TB GOLD PLUS
Mitogen-NIL: >10 [IU]/mL
NIL: 0.01 [IU]/mL
QuantiFERON-TB Gold Plus: NEGATIVE
TB1-NIL: 0.01 [IU]/mL
TB2-NIL: 0 [IU]/mL

## 2023-08-16 ENCOUNTER — Telehealth: Payer: Self-pay

## 2023-08-16 ENCOUNTER — Ambulatory Visit: Payer: Medicare Other | Attending: Family Medicine

## 2023-08-16 DIAGNOSIS — G10 Huntington's disease: Secondary | ICD-10-CM

## 2023-08-16 DIAGNOSIS — R2689 Other abnormalities of gait and mobility: Secondary | ICD-10-CM | POA: Insufficient documentation

## 2023-08-16 DIAGNOSIS — M6281 Muscle weakness (generalized): Secondary | ICD-10-CM | POA: Insufficient documentation

## 2023-08-16 DIAGNOSIS — R293 Abnormal posture: Secondary | ICD-10-CM | POA: Insufficient documentation

## 2023-08-16 DIAGNOSIS — R2681 Unsteadiness on feet: Secondary | ICD-10-CM | POA: Insufficient documentation

## 2023-08-16 NOTE — Telephone Encounter (Signed)
Call patient, if she agreeable to PT referral.  Does this need to be home health or can she go to an outpatient facility.  Location preference?

## 2023-08-16 NOTE — Therapy (Signed)
[facility] [address] [place], [place], [address] Phone: [phone]   Fax:  [phone]  Patient Details  Name: Barbara Krause MRN: [id number] Date of Birth: [birth year] Referring Provider:  Excell Seltzer, MD  Encounter Date: 08/16/2023  Physical Therapy Huntington's Screen Pt reports one hx of falling since last therapy encounter, reports LOB but no injury.   Timed Up and Go test: 25 sec w/ RW  10 meter walk test:25 sec = 1.3 ft/sec  5 time sit to stand test: 31 sec  Patient would benefit from Physical Therapy evaluation due to increased difficulty in walking, generalized weakness, unsteadiness on feet, and increased risk for falls per outcome measures      Dion Body, PT 08/16/2023, 1:10 PM  [employer] [facility] [address], [address] [place], [place], [address] Phone: [phone]   Fax:  [phone]

## 2023-08-16 NOTE — Telephone Encounter (Signed)
Barbara Krause was seen for PT screen in our multi-disciplinary clinic.  Physical therapy is recommended for follow up due to functional mobility deficits.  If you agree, please send referral via Epic for Physical Therapy.  Thank you.   1:46 PM, 08/16/23 M. Shary Decamp, PT, DPT Physical Therapist- [place] Office Number: [phone]

## 2023-08-16 NOTE — Telephone Encounter (Signed)
Spoke with Bonita Quin.  She is agreeable to PT Outpatient at [facility] where the call came in from.

## 2023-08-16 NOTE — Addendum Note (Signed)
Addended by: Kerby Nora E on: 08/16/2023 05:03 PM   Modules accepted: Orders

## 2023-08-30 ENCOUNTER — Ambulatory Visit: Payer: Medicare Other

## 2023-08-30 ENCOUNTER — Other Ambulatory Visit: Payer: Self-pay

## 2023-08-30 DIAGNOSIS — R2681 Unsteadiness on feet: Secondary | ICD-10-CM

## 2023-08-30 DIAGNOSIS — M6281 Muscle weakness (generalized): Secondary | ICD-10-CM | POA: Diagnosis present

## 2023-08-30 DIAGNOSIS — G10 Huntington's disease: Secondary | ICD-10-CM | POA: Diagnosis present

## 2023-08-30 DIAGNOSIS — R2689 Other abnormalities of gait and mobility: Secondary | ICD-10-CM

## 2023-08-30 DIAGNOSIS — R293 Abnormal posture: Secondary | ICD-10-CM | POA: Diagnosis present

## 2023-08-30 NOTE — Therapy (Signed)
OUTPATIENT PHYSICAL THERAPY NEURO EVALUATION   Patient Name: Barbara Krause MRN: [id number] DOB:03/18/1941, 82 y.o., female Today's Date: 08/30/2023   PCP: Excell Seltzer, MD REFERRING PROVIDER: Excell Seltzer, MD  END OF SESSION:  PT End of Session - 08/30/23 1411     Visit Number 1    Number of Visits 10    Date for PT Re-Evaluation 10/18/23    Authorization Type Medicare/BCBS    Progress Note Due on Visit 10    PT Start Time 1400    PT Stop Time 1445    PT Time Calculation (min) 45 min             Past Medical History:  Diagnosis Date   Breast cancer (HCC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   Depression    History of chicken pox    History of colon polyps    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HCC)    Kidney stone    Osteoporosis    Past Surgical History:  Procedure Laterality Date   CESAREAN SECTION     1977 and 1979   CHOLECYSTECTOMY     TOTAL MASTECTOMY Bilateral 2009   Patient Active Problem List   Diagnosis Date Noted   Administrative encounter 08/02/2023   Weight loss 02/03/2023   Closed fracture of lower end of right radius with routine healing 11/04/2022   Constipation 10/17/2022   History of CVA (cerebrovascular accident) 10/04/2022   Right thyroid nodule 10/02/2022   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 10/02/2022   Acute CVA (cerebrovascular accident) (HCC) 10/01/2022   Coronary atherosclerosis 08/03/2022   Compression fracture of thoracolumbar vertebra with routine healing 08/03/2022   DNR (do not resuscitate) 08/03/2022   At risk for aspiration 08/03/2022   High cholesterol 08/03/2022   Dementia (HCC) 08/03/2022   Acute midline low back pain without sciatica 03/22/2022   Recurrent falls 11/26/2020   Moderate protein-calorie malnutrition (HCC) 06/30/2020   History of intracranial hemorrhage 06/15/2020   Aortic atherosclerosis (HCC) 08/30/2019   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HCC) 08/30/2019   Microscopic hematuria 04/02/2019   Osteoporosis 04/02/2019   Vitamin B12  deficiency 04/02/2019   Thoracic aortic aneurysm (TAA) (HCC) 04/02/2019   HTN (hypertension) 03/05/2019   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   Tobacco use 03/05/2019   Depression, major, single episode, complete remission (HCC) 03/05/2019   Hx of breast cancer 03/05/2019   S/P bilateral mastectomy 03/05/2019    ONSET DATE: 2020  REFERRING DIAG: G10 (ICD-10-CM) - 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HCC)  THERAPY DIAG:  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HCC)  Unsteadiness on feet  Other abnormalities of gait and mobility  Muscle weakness (generalized)  Rationale for Evaluation and Treatment: Rehabilitation  SUBJECTIVE:  SUBJECTIVE STATEMENT: Pt with Huntington's disease with progression resulting in worsening motor control, balance disturbance, unsteadiness on feet, and increased difficulty in walking Pt accompanied by:  caregiver "[name]"  PERTINENT HISTORY: Huntington's disease, hx of CVA  PAIN:  Are you having pain? No  PRECAUTIONS: Fall  RED FLAGS: None   WEIGHT BEARING RESTRICTIONS: No  FALLS: Has patient fallen in last 6 months? No  LIVING ENVIRONMENT: Lives with: lives alone Lives in: House/apartment Stairs: 3 stairs to enter/exit has BHR Has following equipment at home: Dan Humphreys - 2 wheeled  PLOF: Requires assistive device for independence and Needs assistance with ADLs. Caregiver from noon to 6 PM daily  PATIENT GOALS: improve strength and balance  OBJECTIVE:  Note: Objective measures were completed at Evaluation unless otherwise noted.  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  BED MOBILITY:  Modified indep  TRANSFERS: Assistive device utilized: Environmental consultant - 2 wheeled  Sit to stand: Modified independence Stand to sit: Modified independence and SBA Chair to chair: Modified independence and SBA Floor:  NT    CURB:  Level of Assistance: CGA Assistive device utilized: Environmental consultant - 2 wheeled Curb Comments:   STAIRS: Level of Assistance: SBA and CGA Stair Negotiation Technique: Step to Pattern with Bilateral Rails Number of Stairs: 6  Height of Stairs: 4-6  Comments:   GAIT: Gait pattern: ataxic and wide BOS Distance walked:  Assistive device utilized: Environmental consultant - 2 wheeled Level of assistance: Modified independence and SBA Comments:   FUNCTIONAL TESTS:  5 times sit to stand: 31 sec, BUE support Timed up and go (TUG): 25 sec w/ RW 10 meter walk test: 25 sec = 1.3 ft/sec Berg Balance Scale: 30/56  PATIENT SURVEYS:    TODAY'S TREATMENT:                                                                                                                              DATE: 08/30/23  Instructed in seated OKC PRE w/ 4# with tactile cues to guide/control movement 3x10 reps  PATIENT EDUCATION: Education details: assessment details and CLOF as compared to PLOF outcome measures Person educated: Patient and Caregiver [name] Education method: Explanation Education comprehension: verbalized understanding  HOME EXERCISE PROGRAM: Will refine/update previous HEP  GOALS: Goals reviewed with patient? Yes  SHORT TERM GOALS: Target date: 09/20/2023    Patient will perform HEP with family/caregiver supervision for improved strength, balance, transfers, and gait  Baseline: Goal status: INITIAL  2.  Demo improved mobility and reduced risk for falls per time 20 sec TUG test Baseline: 25 sec  w/ RW Goal status: INITIAL  3.  Manifest improved BLE strength per time 25 sec 5xSTS to improve transfers/mobility Baseline: 31 sec, BUE support Goal status: INITIAL    LONG TERM GOALS: Target date: 10/18/23  Demo reduced risk for falls per score 44/56 Berg Balance Test Baseline: 30/56 Goal status: INITIAL  2.  Improve gait speed to 1.8 ft/sec to improve efficiency of ambulation Baseline: 1.3 ft/sec Goal status: INITIAL    ASSESSMENT:  CLINICAL IMPRESSION: Patient is a 82 y.o. lady who was seen today for physical therapy evaluation and treatment for Huntington's Disease and related mobility deficits and impairments.  Demonstrates decline in functional mobility and motor control as compared to previous interactions with increased risk for falls per outcome measures and generalized weakness.  Decrease in gait speed and notable right knee instability present also contributing to risk for falls. Pt would benefit from PT services to provide relevant interventions, compensation/adaptation strategies to improve/maintain mobility and reduce risk for falls.    OBJECTIVE IMPAIRMENTS: Abnormal gait, decreased activity tolerance, decreased balance, decreased coordination, decreased mobility, difficulty walking, decreased strength, and improper body mechanics.   ACTIVITY LIMITATIONS: carrying, lifting, bending, standing, stairs, transfers, and locomotion level  PARTICIPATION LIMITATIONS: meal prep, cleaning, interpersonal relationship, community activity, and exercise participation  PERSONAL FACTORS: Age, Time since onset of injury/illness/exacerbation, and 1-2 comorbidities: PMH  are also affecting patient's functional outcome.   REHAB POTENTIAL: Good  CLINICAL DECISION MAKING: Evolving/moderate complexity  EVALUATION COMPLEXITY: Moderate  PLAN:  PT FREQUENCY: 1-2x/week, plan is 2x/wk x 3 wks, then 1x/wk x 3 wks  PT DURATION: 6 weeks  PLANNED INTERVENTIONS: 97110-Therapeutic  exercises, 97530- Therapeutic activity, 97112- Neuromuscular re-education, 97535- Self Care, 45409- Manual therapy, [phone]- Gait training, [phone]- Orthotic Fit/training, [phone]- Canalith repositioning, [phone]- Aquatic Therapy, Patient/Family education, Balance training, Stair training, Taping, Joint mobilization, Spinal mobilization, Vestibular training, DME instructions, and Wheelchair mobility training  PLAN FOR NEXT SESSION: review previous HEP and refine   4:23 PM, 08/30/23 M. Shary Decamp, PT, DPT Physical Therapist- [place] Office Number: [phone]

## 2023-09-01 ENCOUNTER — Ambulatory Visit: Payer: Medicare Other

## 2023-09-01 DIAGNOSIS — R2681 Unsteadiness on feet: Secondary | ICD-10-CM | POA: Diagnosis not present

## 2023-09-01 DIAGNOSIS — R293 Abnormal posture: Secondary | ICD-10-CM

## 2023-09-01 DIAGNOSIS — R2689 Other abnormalities of gait and mobility: Secondary | ICD-10-CM

## 2023-09-01 DIAGNOSIS — G10 Huntington's disease: Secondary | ICD-10-CM

## 2023-09-01 DIAGNOSIS — M6281 Muscle weakness (generalized): Secondary | ICD-10-CM

## 2023-09-01 NOTE — Therapy (Signed)
OUTPATIENT PHYSICAL THERAPY NEURO TREATMENT   Patient Name: Barbara Krause MRN: [id number] DOB:11/15/1940, 82 y.o., female Today's Date: 09/01/2023   PCP: Excell Seltzer, MD REFERRING PROVIDER: Excell Seltzer, MD  END OF SESSION:  PT End of Session - 09/01/23 1536     Visit Number 2    Number of Visits 10    Date for PT Re-Evaluation 10/18/23    Authorization Type Medicare/BCBS    Progress Note Due on Visit 10    PT Start Time 1530    PT Stop Time 1615    PT Time Calculation (min) 45 min             Past Medical History:  Diagnosis Date   Breast cancer (HCC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   Depression    History of chicken pox    History of colon polyps    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HCC)    Kidney stone    Osteoporosis    Past Surgical History:  Procedure Laterality Date   CESAREAN SECTION     1977 and 1979   CHOLECYSTECTOMY     TOTAL MASTECTOMY Bilateral 2009   Patient Active Problem List   Diagnosis Date Noted   Administrative encounter 08/02/2023   Weight loss 02/03/2023   Closed fracture of lower end of right radius with routine healing 11/04/2022   Constipation 10/17/2022   History of CVA (cerebrovascular accident) 10/04/2022   Right thyroid nodule 10/02/2022   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 10/02/2022   Acute CVA (cerebrovascular accident) (HCC) 10/01/2022   Coronary atherosclerosis 08/03/2022   Compression fracture of thoracolumbar vertebra with routine healing 08/03/2022   DNR (do not resuscitate) 08/03/2022   At risk for aspiration 08/03/2022   High cholesterol 08/03/2022   Dementia (HCC) 08/03/2022   Acute midline low back pain without sciatica 03/22/2022   Recurrent falls 11/26/2020   Moderate protein-calorie malnutrition (HCC) 06/30/2020   History of intracranial hemorrhage 06/15/2020   Aortic atherosclerosis (HCC) 08/30/2019   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HCC) 08/30/2019   Microscopic hematuria 04/02/2019   Osteoporosis 04/02/2019   Vitamin B12  deficiency 04/02/2019   Thoracic aortic aneurysm (TAA) (HCC) 04/02/2019   HTN (hypertension) 03/05/2019   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   Tobacco use 03/05/2019   Depression, major, single episode, complete remission (HCC) 03/05/2019   Hx of breast cancer 03/05/2019   S/P bilateral mastectomy 03/05/2019    ONSET DATE: 2020  REFERRING DIAG: G10 (ICD-10-CM) - 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HCC)  THERAPY DIAG:  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HCC)  Unsteadiness on feet  Other abnormalities of gait and mobility  Muscle weakness (generalized)  Abnormal posture  Rationale for Evaluation and Treatment: Rehabilitation  SUBJECTIVE:  SUBJECTIVE STATEMENT: Doing ok, no issues after last session Pt accompanied by:  caregiver "[name]"  PERTINENT HISTORY: Huntington's disease, hx of CVA  PAIN:  Are you having pain? No  PRECAUTIONS: Fall  RED FLAGS: None   WEIGHT BEARING RESTRICTIONS: No  FALLS: Has patient fallen in last 6 months? No  LIVING ENVIRONMENT: Lives with: lives alone Lives in: House/apartment Stairs: 3 stairs to enter/exit has BHR Has following equipment at home: Dan Humphreys - 2 wheeled  PLOF: Requires assistive device for independence and Needs assistance with ADLs. Caregiver from noon to 6 PM daily  PATIENT GOALS: improve strength and balance  OBJECTIVE:   TODAY'S TREATMENT: 09/01/23 Activity Comments  NU-step x 8 min level 5 For general conditioning and activity tolerance, cues in maintaining 60-70 SPM  LAQ 3x10 5#  Hip abd/add iso 3x10   Standing march 3x10 5#, visual target for step height  Standing balance -standing on foam: EO 2x30 sec. EC, unable to maintain balance. Chest pass 1x10, granny toss 1x10 red med ball  Retro-walking w/ CGA-min A -3x25 ft, increased anxiety      PATIENT EDUCATION: Education details:  Person educated: Patient and Caregiver [name] Education method: Explanation Education comprehension: verbalized understanding  HOME EXERCISE PROGRAM: Will refine/update previous HEP  Note: Objective measures were completed at Evaluation unless otherwise noted.  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  BED MOBILITY:  Modified indep  TRANSFERS: Assistive device utilized: Environmental consultant - 2 wheeled  Sit to stand: Modified independence Stand to sit: Modified independence and SBA Chair to chair: Modified independence and SBA Floor:  NT    CURB:  Level of Assistance: CGA Assistive device utilized: Environmental consultant - 2 wheeled Curb Comments:   STAIRS: Level of Assistance: SBA and CGA Stair Negotiation Technique: Step to Pattern with Bilateral Rails Number of Stairs: 6  Height of Stairs: 4-6  Comments:   GAIT: Gait pattern: ataxic and wide BOS Distance walked:  Assistive device utilized: Environmental consultant - 2 wheeled Level of assistance: Modified independence and SBA Comments:   FUNCTIONAL TESTS:  5 times sit to stand: 31 sec, BUE support Timed up and go (TUG): 25 sec w/ RW 10 meter walk test: 25 sec = 1.3 ft/sec Berg Balance Scale: 30/56      GOALS: Goals reviewed with patient? Yes  SHORT TERM GOALS: Target date: 09/20/2023    Patient will perform HEP with family/caregiver supervision for improved strength, balance, transfers, and gait  Baseline: Goal status: INITIAL  2.  Demo improved mobility and reduced risk for falls per time 20 sec TUG  test Baseline: 25 sec w/ RW Goal status: INITIAL  3.  Manifest improved BLE strength per time 25 sec 5xSTS to improve transfers/mobility Baseline: 31 sec, BUE support Goal status: INITIAL    LONG TERM GOALS: Target date: 10/18/23  Demo reduced risk for falls per score 44/56 Berg Balance Test Baseline: 30/56 Goal status: INITIAL  2.  Improve gait speed to 1.8 ft/sec to improve efficiency of ambulation Baseline: 1.3 ft/sec Goal status: INITIAL    ASSESSMENT:  CLINICAL IMPRESSION: Reports no adverse issues from previous session strength  training. Initiated with focus on general conditioning to improve strength and motor control with NU-step and resisted exercises with addition of weight to 5# and increased by 1 set of 10 reps for each movement.  Marked difficulty with eyes closed on compliant surfaces with near instance retro-LOB experienced.  Able to withstand moderate perturbations eyes open on compliant surfaces. Able to perform retrowalking without AD and good control/form but increased anxiety and freezing of gait when walking forward without AD. Continued sessions to progress POC details to improve strength, balance, and activity tolerance for safety in mobility   OBJECTIVE IMPAIRMENTS: Abnormal gait, decreased activity tolerance, decreased balance, decreased coordination, decreased mobility, difficulty walking, decreased strength, and improper body mechanics.   ACTIVITY LIMITATIONS: carrying, lifting, bending, standing, stairs, transfers, and locomotion level  PARTICIPATION LIMITATIONS: meal prep, cleaning, interpersonal relationship, community activity, and exercise participation  PERSONAL FACTORS: Age, Time since onset of injury/illness/exacerbation, and 1-2 comorbidities: PMH  are also affecting patient's functional outcome.   REHAB POTENTIAL: Good  CLINICAL DECISION MAKING: Evolving/moderate complexity  EVALUATION COMPLEXITY: Moderate  PLAN:  PT FREQUENCY:  1-2x/week, plan is 2x/wk x 3 wks, then 1x/wk x 3 wks  PT DURATION: 6 weeks  PLANNED INTERVENTIONS: 97110-Therapeutic exercises, 97530- Therapeutic activity, 97112- Neuromuscular re-education, 97535- Self Care, 32951- Manual therapy, [phone]- Gait training, [phone]- Orthotic Fit/training, [phone]- Canalith repositioning, [phone]- Aquatic Therapy, Patient/Family education, Balance training, Stair training, Taping, Joint mobilization, Spinal mobilization, Vestibular training, DME instructions, and Wheelchair mobility training  PLAN FOR NEXT SESSION: review previous HEP and refine   3:36 PM, 09/01/23 M. Shary Decamp, PT, DPT Physical Therapist- [place] Office Number: [phone]

## 2023-09-02 ENCOUNTER — Telehealth: Payer: Self-pay | Admitting: Family Medicine

## 2023-09-02 NOTE — Telephone Encounter (Signed)
Daughter calling to check on paperwork. I didn't see anything in the front folder.

## 2023-09-02 NOTE — Telephone Encounter (Signed)
completed

## 2023-09-06 ENCOUNTER — Ambulatory Visit: Payer: Medicare Other | Attending: Family Medicine

## 2023-09-06 DIAGNOSIS — R2689 Other abnormalities of gait and mobility: Secondary | ICD-10-CM | POA: Diagnosis present

## 2023-09-06 DIAGNOSIS — G10 Huntington's disease: Secondary | ICD-10-CM | POA: Diagnosis present

## 2023-09-06 DIAGNOSIS — R2681 Unsteadiness on feet: Secondary | ICD-10-CM | POA: Diagnosis present

## 2023-09-06 DIAGNOSIS — R293 Abnormal posture: Secondary | ICD-10-CM | POA: Diagnosis present

## 2023-09-06 DIAGNOSIS — R29818 Other symptoms and signs involving the nervous system: Secondary | ICD-10-CM | POA: Insufficient documentation

## 2023-09-06 DIAGNOSIS — M6281 Muscle weakness (generalized): Secondary | ICD-10-CM | POA: Insufficient documentation

## 2023-09-06 NOTE — Therapy (Signed)
OUTPATIENT PHYSICAL THERAPY NEURO TREATMENT   Patient Name: Chitara Clonch MRN: [id number] DOB:October 30, 1941, 82 y.o., female Today's Date: 09/06/2023   PCP: Excell Seltzer, MD REFERRING PROVIDER: Excell Seltzer, MD  END OF SESSION:  PT End of Session - 09/06/23 1536     Visit Number 3    Number of Visits 10    Date for PT Re-Evaluation 10/18/23    Authorization Type Medicare/BCBS    Progress Note Due on Visit 10    PT Start Time 1530    PT Stop Time 1615    PT Time Calculation (min) 45 min             Past Medical History:  Diagnosis Date   Breast cancer (HCC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   Depression    History of chicken pox    History of colon polyps    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HCC)    Kidney stone    Osteoporosis    Past Surgical History:  Procedure Laterality Date   CESAREAN SECTION     1977 and 1979   CHOLECYSTECTOMY     TOTAL MASTECTOMY Bilateral 2009   Patient Active Problem List   Diagnosis Date Noted   Administrative encounter 08/02/2023   Weight loss 02/03/2023   Closed fracture of lower end of right radius with routine healing 11/04/2022   Constipation 10/17/2022   History of CVA (cerebrovascular accident) 10/04/2022   Right thyroid nodule 10/02/2022   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 10/02/2022   Acute CVA (cerebrovascular accident) (HCC) 10/01/2022   Coronary atherosclerosis 08/03/2022   Compression fracture of thoracolumbar vertebra with routine healing 08/03/2022   DNR (do not resuscitate) 08/03/2022   At risk for aspiration 08/03/2022   High cholesterol 08/03/2022   Dementia (HCC) 08/03/2022   Acute midline low back pain without sciatica 03/22/2022   Recurrent falls 11/26/2020   Moderate protein-calorie malnutrition (HCC) 06/30/2020   History of intracranial hemorrhage 06/15/2020   Aortic atherosclerosis (HCC) 08/30/2019   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HCC) 08/30/2019   Microscopic hematuria 04/02/2019   Osteoporosis 04/02/2019   Vitamin B12  deficiency 04/02/2019   Thoracic aortic aneurysm (TAA) (HCC) 04/02/2019   HTN (hypertension) 03/05/2019   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 No new issues Pt accompanied by:  caregiver "[name]"  PERTINENT HISTORY: Huntington's disease, hx of CVA  PAIN:  Are you having pain? No  PRECAUTIONS: Fall  RED FLAGS: None   WEIGHT BEARING RESTRICTIONS: No  FALLS: Has patient fallen in last 6 months? No  LIVING ENVIRONMENT: Lives with: lives alone Lives in: House/apartment Stairs: 3 stairs to enter/exit has BHR Has following equipment at home: Dan Humphreys - 2 wheeled  PLOF: Requires assistive device for independence and Needs assistance with ADLs. Caregiver from noon to 6 PM daily  PATIENT GOALS: improve strength and balance  OBJECTIVE:   TODAY'S TREATMENT: 09/06/23 Activity Comments  NU-step resistance intervals x 8 min 2 min warm-up 30 sec heavy; 30 sec light To improve activity tolerance/LE strength  LAQ 3x10 5#  Seated step initiation/height 1x10 5#  Standing march 1x10 5#  Sidestepping x 2 min   Standing on foam -EO/EC 2x30 sec -1x10 throwing/catching various forms for righting reactions and gross motor coordination   4-square step W/ RW to improve negotiation of obstacles and step height  Retro-walking 4x25 ft W/ RW and CGA         PATIENT EDUCATION: Education details:  Person educated: Patient and Statistician Education method: Explanation Education comprehension: verbalized understanding  HOME EXERCISE PROGRAM: Will refine/update previous HEP  Note: Objective measures were completed at Evaluation unless otherwise noted.  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  BED MOBILITY:  Modified indep  TRANSFERS: Assistive device utilized: Environmental consultant - 2 wheeled  Sit to stand: Modified independence Stand to sit: Modified independence and SBA Chair to chair: Modified independence and SBA Floor:  NT    CURB:  Level of Assistance: CGA Assistive device utilized: Environmental consultant - 2 wheeled Curb Comments:   STAIRS: Level of Assistance: SBA and CGA Stair Negotiation Technique: Step to Pattern with Bilateral Rails Number of Stairs: 6  Height of Stairs: 4-6  Comments:   GAIT: Gait pattern: ataxic and wide BOS Distance walked:  Assistive device utilized: Environmental consultant - 2 wheeled Level of assistance: Modified independence and SBA Comments:   FUNCTIONAL TESTS:  5 times sit to stand: 31 sec, BUE support Timed up and go (TUG): 25 sec w/ RW 10 meter walk test: 25 sec = 1.3 ft/sec Berg Balance Scale: 30/56      GOALS: Goals reviewed with patient? Yes  SHORT TERM GOALS: Target date: 09/20/2023    Patient will perform HEP with family/caregiver supervision for improved strength, balance, transfers, and gait   Baseline: Goal status: IN PROGRESS  2.  Demo improved mobility and reduced risk for falls per time 20 sec TUG test Baseline: 25 sec w/ RW Goal status: IN PROGRESS  3.  Manifest improved BLE strength per time 25 sec 5xSTS to improve transfers/mobility Baseline: 31 sec, BUE support Goal status: IN PROGRESS    LONG TERM GOALS: Target date: 10/18/23  Demo reduced risk for falls per score 44/56 Berg Balance Test Baseline: 30/56 Goal status: INITIAL  2.  Improve gait speed to 1.8 ft/sec to improve efficiency of ambulation Baseline: 1.3 ft/sec Goal status:  INITIAL    ASSESSMENT:  CLINICAL IMPRESSION: Continued with general conditioning/strength training using NU-step resistance intervals for sustained output followed by [place] PRE with ability to increase weight to 5# without issue.  Static and dynamic balance activities to improve safety with mobility and enhance single limb support and facilitate righting reactions.  Difficulty with eyes closed/compliant surfaces with tendency for right/backwards LOB requiring CGA-min A to correct.  Pt reports increased anxiety when attempting mobility w/out RW. Continued sessions to progress POC details.   OBJECTIVE IMPAIRMENTS: Abnormal gait, decreased activity tolerance, decreased balance, decreased coordination, decreased mobility, difficulty walking, decreased strength, and improper body mechanics.   ACTIVITY LIMITATIONS: carrying, lifting, bending, standing, stairs, transfers, and locomotion level  PARTICIPATION LIMITATIONS: meal prep, cleaning, interpersonal relationship, community activity, and exercise participation  PERSONAL FACTORS: Age, Time since onset of injury/illness/exacerbation, and 1-2 comorbidities: PMH  are also affecting patient's functional outcome.   REHAB POTENTIAL: Good  CLINICAL DECISION MAKING: Evolving/moderate complexity  EVALUATION COMPLEXITY: Moderate  PLAN:  PT FREQUENCY: 1-2x/week, plan is 2x/wk x 3 wks, then  1x/wk x 3 wks  PT DURATION: 6 weeks  PLANNED INTERVENTIONS: 97110-Therapeutic exercises, 97530- Therapeutic activity, 97112- Neuromuscular re-education, 97535- Self Care, 40981- Manual therapy, [phone]- Gait training, [phone]- Orthotic Fit/training, [phone]- Canalith repositioning, [phone]- Aquatic Therapy, Patient/Family education, Balance training, Stair training, Taping, Joint mobilization, Spinal mobilization, Vestibular training, DME instructions, and Wheelchair mobility training  PLAN FOR NEXT SESSION: review previous HEP and refine   3:36 PM, 09/06/23 M. Shary Decamp, PT, DPT Physical Therapist- [place] Office Number: [phone]

## 2023-09-08 ENCOUNTER — Ambulatory Visit: Payer: Medicare Other

## 2023-09-08 DIAGNOSIS — G10 Huntington's disease: Secondary | ICD-10-CM | POA: Diagnosis not present

## 2023-09-08 DIAGNOSIS — R2681 Unsteadiness on feet: Secondary | ICD-10-CM

## 2023-09-08 DIAGNOSIS — R293 Abnormal posture: Secondary | ICD-10-CM

## 2023-09-08 DIAGNOSIS — M6281 Muscle weakness (generalized): Secondary | ICD-10-CM

## 2023-09-08 DIAGNOSIS — R29818 Other symptoms and signs involving the nervous system: Secondary | ICD-10-CM

## 2023-09-08 DIAGNOSIS — R2689 Other abnormalities of gait and mobility: Secondary | ICD-10-CM

## 2023-09-08 NOTE — Therapy (Signed)
OUTPATIENT PHYSICAL THERAPY NEURO TREATMENT   Patient Name: Barbara Krause MRN: [id number] DOB:[birth year], 82 y.o., female Today's Date: 09/08/2023   PCP: Excell Seltzer, MD REFERRING PROVIDER: Excell Seltzer, MD  END OF SESSION:  PT End of Session - 09/08/23 1443     Visit Number 4    Number of Visits 10    Date for PT Re-Evaluation 10/18/23    Authorization Type Medicare/BCBS    Progress Note Due on Visit 10    PT Start Time 1445    PT Stop Time 1530    PT Time Calculation (min) 45 min             Past Medical History:  Diagnosis Date   Breast cancer (HCC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   Depression    History of chicken pox    History of colon polyps    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HCC)    Kidney stone    Osteoporosis    Past Surgical History:  Procedure Laterality Date   CESAREAN SECTION     1977 and 1979   CHOLECYSTECTOMY     TOTAL MASTECTOMY Bilateral 2009   Patient Active Problem List   Diagnosis Date Noted   Administrative encounter 08/02/2023   Weight loss 02/03/2023   Closed fracture of lower end of right radius with routine healing 11/04/2022   Constipation 10/17/2022   History of CVA (cerebrovascular accident) 10/04/2022   Right thyroid nodule 10/02/2022   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 10/02/2022   Acute CVA (cerebrovascular accident) (HCC) 10/01/2022   Coronary atherosclerosis 08/03/2022   Compression fracture of thoracolumbar vertebra with routine healing 08/03/2022   DNR (do not resuscitate) 08/03/2022   At risk for aspiration 08/03/2022   High cholesterol 08/03/2022   Dementia (HCC) 08/03/2022   Acute midline low back pain without sciatica 03/22/2022   Recurrent falls 11/26/2020   Moderate protein-calorie malnutrition (HCC) 06/30/2020   History of intracranial hemorrhage 06/15/2020   Aortic atherosclerosis (HCC) 08/30/2019   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HCC) 08/30/2019   Microscopic hematuria 04/02/2019   Osteoporosis 04/02/2019   Vitamin B12  deficiency 04/02/2019   Thoracic aortic aneurysm (TAA) (HCC) 04/02/2019   HTN (hypertension) 03/05/2019   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   Tobacco use 03/05/2019   Depression, major, single episode, complete remission (HCC) 03/05/2019   Hx of breast cancer 03/05/2019   S/P bilateral mastectomy 03/05/2019    ONSET DATE: 2020  REFERRING DIAG: G10 (ICD-10-CM) - 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HCC)  THERAPY DIAG:  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HCC)  Unsteadiness on feet  Other abnormalities of gait and mobility  Muscle weakness (generalized)  Abnormal posture  Other symptoms and signs involving the nervous system  Rationale for Evaluation and Treatment: Rehabilitation  SUBJECTIVE:  SUBJECTIVE STATEMENT: No new issues.  Pt accompanied by:  caregiver "[name]"  PERTINENT HISTORY: Huntington's disease, hx of CVA  PAIN:  Are you having pain? No  PRECAUTIONS: Fall  RED FLAGS: None   WEIGHT BEARING RESTRICTIONS: No  FALLS: Has patient fallen in last 6 months? No  LIVING ENVIRONMENT: Lives with: lives alone Lives in: House/apartment Stairs: 3 stairs to enter/exit has BHR Has following equipment at home: Dan Humphreys - 2 wheeled  PLOF: Requires assistive device for independence and Needs assistance with ADLs. Caregiver from noon to 6 PM daily  PATIENT GOALS: improve strength and balance  OBJECTIVE:   TODAY'S TREATMENT: 09/08/23 Activity Comments  NU-step level 4 steady state x 8 min Maintaining 50-60 SPM to improve general conditioning  Sit-stand 3x5 26" seat height, 8# dumbell to improve eccentric control  LAQ 4x10 5#  Seated hip abd/add iso 4x10   Standing march 1x20 5# BUE support  heavy work  -pulling weighted w/c 5x -retro-walking pulling weight w/c x 25 ft--LOB  Static standing Foot  elevated on ball for single limb support x 30 sec    TODAY'S TREATMENT: 09/06/23 Activity Comments  NU-step resistance intervals x 8 min 2 min warm-up 30 sec heavy; 30 sec light To improve activity tolerance/LE strength  LAQ 3x10 5#  Seated step initiation/height 1x10 5#  Standing march 1x10 5#  Sidestepping x 2 min   Standing on foam -EO/EC 2x30 sec -1x10 throwing/catching various forms for righting reactions and gross motor coordination  4-square step W/ RW to improve negotiation of obstacles and step height  Retro-walking 4x25 ft W/ RW and CGA         PATIENT EDUCATION: Education details:  Person educated: Patient and Statistician Education method: Explanation Education comprehension: verbalized understanding  HOME EXERCISE PROGRAM: Will refine/update previous HEP  Note: Objective measures were completed at Evaluation unless otherwise noted.  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  BED MOBILITY:  Modified indep  TRANSFERS: Assistive device utilized: Environmental consultant - 2 wheeled  Sit to stand: Modified independence Stand to sit: Modified independence and SBA Chair to chair: Modified independence and SBA Floor:  NT    CURB:  Level of Assistance: CGA Assistive device utilized: Environmental consultant - 2 wheeled Curb Comments:   STAIRS: Level of Assistance: SBA and CGA Stair Negotiation Technique: Step to Pattern with Bilateral Rails Number of Stairs: 6  Height of Stairs: 4-6  Comments:   GAIT: Gait pattern: ataxic and wide BOS Distance walked:  Assistive device utilized: Environmental consultant - 2  wheeled Level of assistance: Modified independence and SBA Comments:   FUNCTIONAL TESTS:  5 times sit to stand: 31 sec, BUE support Timed up and go (TUG): 25 sec w/ RW 10 meter walk test: 25 sec = 1.3 ft/sec Berg Balance Scale: 30/56      GOALS: Goals reviewed with patient? Yes  SHORT TERM GOALS: Target date: 09/20/2023    Patient will perform HEP with family/caregiver supervision for improved strength, balance, transfers, and gait  Baseline: Goal status: IN PROGRESS  2.  Demo improved mobility and reduced risk for falls per time  20 sec TUG test Baseline: 25 sec w/ RW Goal status: IN PROGRESS  3.  Manifest improved BLE strength per time 25 sec 5xSTS to improve transfers/mobility Baseline: 31 sec, BUE support Goal status: IN PROGRESS    LONG TERM GOALS: Target date: 10/18/23  Demo reduced risk for falls per score 44/56 Berg Balance Test Baseline: 30/56 Goal status: INITIAL  2.  Improve gait speed to 1.8 ft/sec to improve efficiency of ambulation Baseline: 1.3 ft/sec Goal status: INITIAL    ASSESSMENT:  CLINICAL IMPRESSION: Continued with activities to improve general strength and conditioning to improve activity tolerance and maintain mobility progressing for additional sets with 5#.  Performance of heavy work for purpose of improving strength and recruitment. Trials of retro-walking pulling heavy resistance for strength and postural stability with instance of retro-LOB requiring therapist to hold and provide support to upright. Retro-walking increases anxiety and requests end to activity. Static standing balance to improve single limb support with good control x 30 sec. Continued sessions to progress POC details to improve strength and activity tolerance to prevent functional decline.   OBJECTIVE IMPAIRMENTS: Abnormal gait, decreased activity tolerance, decreased balance, decreased coordination, decreased mobility, difficulty walking, decreased strength, and  improper body mechanics.   ACTIVITY LIMITATIONS: carrying, lifting, bending, standing, stairs, transfers, and locomotion level  PARTICIPATION LIMITATIONS: meal prep, cleaning, interpersonal relationship, community activity, and exercise participation  PERSONAL FACTORS: Age, Time since onset of injury/illness/exacerbation, and 1-2 comorbidities: PMH  are also affecting patient's functional outcome.   REHAB POTENTIAL: Good  CLINICAL DECISION MAKING: Evolving/moderate complexity  EVALUATION COMPLEXITY: Moderate  PLAN:  PT FREQUENCY: 1-2x/week, plan is 2x/wk x 3 wks, then 1x/wk x 3 wks  PT DURATION: 6 weeks  PLANNED INTERVENTIONS: 97110-Therapeutic exercises, 97530- Therapeutic activity, 97112- Neuromuscular re-education, 97535- Self Care, 83151- Manual therapy, [phone]- Gait training, [phone]- Orthotic Fit/training, [phone]- Canalith repositioning, [phone]- Aquatic Therapy, Patient/Family education, Balance training, Stair training, Taping, Joint mobilization, Spinal mobilization, Vestibular training, DME instructions, and Wheelchair mobility training  PLAN FOR NEXT SESSION: review previous HEP and refine   2:44 PM, 09/08/23 M. Shary Decamp, PT, DPT Physical Therapist- [place] Office Number: [phone]

## 2023-09-13 ENCOUNTER — Ambulatory Visit: Payer: Medicare Other

## 2023-09-13 DIAGNOSIS — R2689 Other abnormalities of gait and mobility: Secondary | ICD-10-CM

## 2023-09-13 DIAGNOSIS — M6281 Muscle weakness (generalized): Secondary | ICD-10-CM

## 2023-09-13 DIAGNOSIS — G10 Huntington's disease: Secondary | ICD-10-CM

## 2023-09-13 DIAGNOSIS — R29818 Other symptoms and signs involving the nervous system: Secondary | ICD-10-CM

## 2023-09-13 DIAGNOSIS — R2681 Unsteadiness on feet: Secondary | ICD-10-CM

## 2023-09-13 DIAGNOSIS — R293 Abnormal posture: Secondary | ICD-10-CM

## 2023-09-13 NOTE — Therapy (Signed)
OUTPATIENT PHYSICAL THERAPY NEURO TREATMENT   Patient Name: Barbara Krause MRN: [id number] DOB:12/19/1940, 82 y.o., female Today's Date: 09/13/2023   PCP: Excell Seltzer, MD REFERRING PROVIDER: Excell Seltzer, MD  END OF SESSION:  PT End of Session - 09/13/23 1539     Visit Number 5    Number of Visits 10    Date for PT Re-Evaluation 10/18/23    Authorization Type Medicare/BCBS    Progress Note Due on Visit 10    PT Start Time 1530    PT Stop Time 1615    PT Time Calculation (min) 45 min             Past Medical History:  Diagnosis Date   Breast cancer (HCC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   Depression    History of chicken pox    History of colon polyps    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HCC)    Kidney stone    Osteoporosis    Past Surgical History:  Procedure Laterality Date   CESAREAN SECTION     1977 and 1979   CHOLECYSTECTOMY     TOTAL MASTECTOMY Bilateral 2009   Patient Active Problem List   Diagnosis Date Noted   Administrative encounter 08/02/2023   Weight loss 02/03/2023   Closed fracture of lower end of right radius with routine healing 11/04/2022   Constipation 10/17/2022   History of CVA (cerebrovascular accident) 10/04/2022   Right thyroid nodule 10/02/2022   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 10/02/2022   Acute CVA (cerebrovascular accident) (HCC) 10/01/2022   Coronary atherosclerosis 08/03/2022   Compression fracture of thoracolumbar vertebra with routine healing 08/03/2022   DNR (do not resuscitate) 08/03/2022   At risk for aspiration 08/03/2022   High cholesterol 08/03/2022   Dementia (HCC) 08/03/2022   Acute midline low back pain without sciatica 03/22/2022   Recurrent falls 11/26/2020   Moderate protein-calorie malnutrition (HCC) 06/30/2020   History of intracranial hemorrhage 06/15/2020   Aortic atherosclerosis (HCC) 08/30/2019   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HCC) 08/30/2019   Microscopic hematuria 04/02/2019   Osteoporosis 04/02/2019   Vitamin B12  deficiency 04/02/2019   Thoracic aortic aneurysm (TAA) (HCC) 04/02/2019   HTN (hypertension) 03/05/2019   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  Pt accompanied by:  caregiver "[name]"  PERTINENT HISTORY: Huntington's disease, hx of CVA  PAIN:  Are you having pain? No  PRECAUTIONS: Fall  RED FLAGS: None   WEIGHT BEARING RESTRICTIONS: No  FALLS: Has patient fallen in last 6 months? No  LIVING ENVIRONMENT: Lives with: lives alone Lives in: House/apartment Stairs: 3 stairs to enter/exit has BHR Has following equipment at home: Dan Humphreys - 2 wheeled  PLOF: Requires assistive device for independence and Needs assistance with ADLs. Caregiver from noon to 6 PM daily  PATIENT GOALS: improve strength and balance  OBJECTIVE:   TODAY'S TREATMENT: 09/13/23 Activity Comments  NU-step level 4 x 8 min For rapid alternating movements   Sit to stand 3x5 reps 24" seat height 8# goblet  LAQ 3x10 5#  Hip add/abd iso 3x10   Multi-sensory balance         TODAY'S TREATMENT: 09/08/23 Activity Comments  NU-step level 4 steady state x 8 min Maintaining 50-60 SPM to improve general conditioning  Sit-stand 3x5 26" seat  height, 8# dumbell to improve eccentric control  LAQ 4x10 5#  Seated hip abd/add iso 4x10   Standing march 1x20 5# BUE support  heavy work  -pulling weighted w/c 5x -retro-walking pulling weight w/c x 25 ft--LOB  Static standing Foot elevated on ball for single limb support x 30 sec            PATIENT EDUCATION: Education details:  Person educated: Patient and Production assistant, radio method: Explanation Education comprehension: verbalized understanding  HOME EXERCISE PROGRAM: Will refine/update previous HEP  Note: Objective measures were completed at Evaluation unless otherwise noted.  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  BED MOBILITY:  Modified indep  TRANSFERS: Assistive device utilized: Environmental consultant - 2 wheeled  Sit to stand: Modified independence Stand to sit: Modified independence and SBA Chair to chair: Modified independence and SBA Floor:  NT    CURB:  Level of Assistance: CGA Assistive device utilized: Environmental consultant - 2 wheeled Curb Comments:   STAIRS: Level of Assistance: SBA and CGA Stair Negotiation Technique: Step to Pattern with Bilateral Rails Number of Stairs: 6  Height of Stairs: 4-6  Comments:   GAIT: Gait pattern: ataxic and wide BOS Distance walked:  Assistive device utilized: Environmental consultant - 2 wheeled Level of assistance: Modified independence and SBA Comments:   FUNCTIONAL TESTS:  5 times sit to stand: 31 sec, BUE support Timed up and go (TUG): 25 sec w/ RW 10 meter walk test: 25 sec = 1.3 ft/sec Berg Balance Scale: 30/56      GOALS: Goals  reviewed with patient? Yes  SHORT TERM GOALS: Target date: 09/20/2023    Patient will perform HEP with family/caregiver supervision for improved strength, balance, transfers, and gait  Baseline: Goal status: IN PROGRESS  2.  Demo improved mobility and reduced risk for falls per time 20 sec TUG test Baseline: 25 sec w/ RW Goal status: IN PROGRESS  3.  Manifest improved BLE strength per time 25 sec 5xSTS to improve transfers/mobility Baseline: 31 sec, BUE support  Goal status: IN PROGRESS    LONG TERM GOALS: Target date: 10/18/23  Demo reduced risk for falls per score 44/56 Berg Balance Test Baseline: 30/56 Goal status: INITIAL  2.  Improve gait speed to 1.8 ft/sec to improve efficiency of ambulation Baseline: 1.3 ft/sec Goal status: INITIAL    ASSESSMENT:  CLINICAL IMPRESSION: Continued with there ex for general conditioning and strength to improve/maintain activity tolerance. Notes some discomfort with terminal knee extension, instructed in limiting ROM to good effect.  Continued with multi-sensory balance activities with tendency for retro-LOB on complianat surfaces and eyes closed conditions requiring tactile cues for trunk reference to re-orient with improved righting reactions given cue.  Reduced need for therapeutic rest periods during session appreciatde today only requiring two throughout session. Continued sessions to progress POC details to improve mobility and reduce risk for falls  OBJECTIVE IMPAIRMENTS: Abnormal gait, decreased activity tolerance, decreased balance, decreased coordination, decreased mobility, difficulty walking, decreased strength, and improper body mechanics.   ACTIVITY LIMITATIONS: carrying, lifting, bending, standing, stairs, transfers, and locomotion level  PARTICIPATION LIMITATIONS: meal prep, cleaning, interpersonal relationship, community activity, and exercise participation  PERSONAL FACTORS: Age, Time since onset of  injury/illness/exacerbation, and 1-2 comorbidities: PMH  are also affecting patient's functional outcome.   REHAB POTENTIAL: Good  CLINICAL DECISION MAKING: Evolving/moderate complexity  EVALUATION COMPLEXITY: Moderate  PLAN:  PT FREQUENCY: 1-2x/week, plan is 2x/wk x 3 wks, then 1x/wk x 3 wks  PT DURATION: 6 weeks  PLANNED INTERVENTIONS: 97110-Therapeutic exercises, 97530- Therapeutic activity, 97112- Neuromuscular re-education, 97535- Self Care, 72536- Manual therapy, [phone]- Gait training, [phone]- Orthotic Fit/training, [phone]- Canalith repositioning, [phone]- Aquatic Therapy, Patient/Family education, Balance training, Stair training, Taping, Joint mobilization, Spinal mobilization, Vestibular training, DME instructions, and Wheelchair mobility training  PLAN FOR NEXT SESSION: review previous HEP and refine   3:40 PM, 09/13/23 M. Shary Decamp, PT, DPT Physical Therapist- [place] Office Number: [phone]

## 2023-09-15 ENCOUNTER — Ambulatory Visit: Payer: Medicare Other

## 2023-09-15 DIAGNOSIS — R29818 Other symptoms and signs involving the nervous system: Secondary | ICD-10-CM

## 2023-09-15 DIAGNOSIS — G10 Huntington's disease: Secondary | ICD-10-CM | POA: Diagnosis not present

## 2023-09-15 DIAGNOSIS — M6281 Muscle weakness (generalized): Secondary | ICD-10-CM

## 2023-09-15 DIAGNOSIS — R2689 Other abnormalities of gait and mobility: Secondary | ICD-10-CM

## 2023-09-15 DIAGNOSIS — R293 Abnormal posture: Secondary | ICD-10-CM

## 2023-09-15 DIAGNOSIS — R2681 Unsteadiness on feet: Secondary | ICD-10-CM

## 2023-09-15 NOTE — Therapy (Signed)
OUTPATIENT PHYSICAL THERAPY NEURO TREATMENT   Patient Name: Barbara Krause MRN: [id number] DOB:August 02, 1941, 82 y.o., female Today's Date: 09/15/2023   PCP: Excell Seltzer, MD REFERRING PROVIDER: Excell Seltzer, MD  END OF SESSION:  PT End of Session - 09/15/23 1536     Visit Number 6    Number of Visits 10    Date for PT Re-Evaluation 10/18/23    Authorization Type Medicare/BCBS    Progress Note Due on Visit 10    PT Start Time 1532    PT Stop Time 1615    PT Time Calculation (min) 43 min             Past Medical History:  Diagnosis Date   Breast cancer (HCC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   Depression    History of chicken pox    History of colon polyps    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HCC)    Kidney stone    Osteoporosis    Past Surgical History:  Procedure Laterality Date   CESAREAN SECTION     1977 and 1979   CHOLECYSTECTOMY     TOTAL MASTECTOMY Bilateral 2009   Patient Active Problem List   Diagnosis Date Noted   Administrative encounter 08/02/2023   Weight loss 02/03/2023   Closed fracture of lower end of right radius with routine healing 11/04/2022   Constipation 10/17/2022   History of CVA (cerebrovascular accident) 10/04/2022   Right thyroid nodule 10/02/2022   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 10/02/2022   Acute CVA (cerebrovascular accident) (HCC) 10/01/2022   Coronary atherosclerosis 08/03/2022   Compression fracture of thoracolumbar vertebra with routine healing 08/03/2022   DNR (do not resuscitate) 08/03/2022   At risk for aspiration 08/03/2022   High cholesterol 08/03/2022   Dementia (HCC) 08/03/2022   Acute midline low back pain without sciatica 03/22/2022   Recurrent falls 11/26/2020   Moderate protein-calorie malnutrition (HCC) 06/30/2020   History of intracranial hemorrhage 06/15/2020   Aortic atherosclerosis (HCC) 08/30/2019   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HCC) 08/30/2019   Microscopic hematuria 04/02/2019   Osteoporosis 04/02/2019   Vitamin B12  deficiency 04/02/2019   Thoracic aortic aneurysm (TAA) (HCC) 04/02/2019   HTN (hypertension) 03/05/2019   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   Tobacco use 03/05/2019   Depression, major, single episode, complete remission (HCC) 03/05/2019   Hx of breast cancer 03/05/2019   S/P bilateral mastectomy 03/05/2019    ONSET DATE: 2020  REFERRING DIAG: G10 (ICD-10-CM) - 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HCC)  THERAPY DIAG:  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HCC)  Unsteadiness on feet  Other abnormalities of gait and mobility  Muscle weakness (generalized)  Abnormal posture  Other symptoms and signs involving the nervous system  Rationale for Evaluation and Treatment: Rehabilitation  SUBJECTIVE:  SUBJECTIVE STATEMENT: Feeling fine  Pt accompanied by:  caregiver "[name]"  PERTINENT HISTORY: Huntington's disease, hx of CVA  PAIN:  Are you having pain? No  PRECAUTIONS: Fall  RED FLAGS: None   WEIGHT BEARING RESTRICTIONS: No  FALLS: Has patient fallen in last 6 months? No  LIVING ENVIRONMENT: Lives with: lives alone Lives in: House/apartment Stairs: 3 stairs to enter/exit has BHR Has following equipment at home: Dan Humphreys - 2 wheeled  PLOF: Requires assistive device for independence and Needs assistance with ADLs. Caregiver from noon to 6 PM daily  PATIENT GOALS: improve strength and balance  OBJECTIVE:   TODAY'S TREATMENT: 09/15/23 Activity Comments  NU-step level 5 x 8 min Steady-state 40-50 SPM  Resisted sit-stand 3x5 Yellow t-band, elevated EOM 23"  Static balance -unsupported standing withstanding postural perturbations 1x10 all directions against yellow t-band resistance  LAQ 3x10 5#  Standing march 2x10 5# BUE support  Dynamic balance -walking on compliant surface: EO w/ RW, HHA, sidestepping,  trial of eyes closed.        PATIENT EDUCATION: Education details:  Person educated: Patient and Production assistant, radio method: Explanation Education comprehension: verbalized understanding  HOME EXERCISE PROGRAM: Will refine/update previous HEP  Note: Objective measures were completed at Evaluation unless otherwise noted.  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  BED MOBILITY:  Modified indep  TRANSFERS: Assistive device utilized: Environmental consultant - 2 wheeled  Sit to stand: Modified independence Stand to sit: Modified independence and SBA Chair to chair: Modified independence and SBA Floor:  NT    CURB:  Level of Assistance: CGA Assistive device utilized: Environmental consultant - 2 wheeled Curb Comments:   STAIRS: Level of Assistance: SBA and CGA Stair Negotiation Technique: Step to Pattern with Bilateral Rails Number of Stairs: 6  Height of Stairs: 4-6  Comments:   GAIT: Gait pattern: ataxic and wide BOS Distance walked:  Assistive device utilized: Environmental consultant - 2 wheeled Level of assistance: Modified independence and SBA Comments:   FUNCTIONAL TESTS:  5 times sit to stand: 31 sec, BUE support Timed up and go (TUG): 25 sec w/ RW 10 meter walk test: 25 sec = 1.3 ft/sec Berg Balance Scale: 30/56      GOALS: Goals reviewed with patient? Yes  SHORT TERM GOALS: Target date: 09/20/2023    Patient will perform HEP with family/caregiver supervision for improved strength, balance, transfers, and gait  Baseline: Goal status: IN PROGRESS  2.  Demo improved mobility and reduced risk for  falls per time 20 sec TUG test Baseline: 25 sec w/ RW Goal status: IN PROGRESS  3.  Manifest improved BLE strength per time 25 sec 5xSTS to improve transfers/mobility Baseline: 31 sec, BUE support Goal status: IN PROGRESS    LONG TERM GOALS: Target date: 10/18/23  Demo reduced risk for falls per score 44/56 Berg Balance Test Baseline: 30/56 Goal status: INITIAL  2.  Improve gait speed to 1.8 ft/sec to improve efficiency of ambulation Baseline: 1.3 ft/sec Goal status: INITIAL    ASSESSMENT:  CLINICAL IMPRESSION: Training to improve BLE strength for mobility and activity tolerance. Dynamic balance activities to improve  safety with unsupported standing and negotiation of uneven surfaces to reduce risk for falls in community mobility.  Continued sessions to progress POC details  OBJECTIVE IMPAIRMENTS: Abnormal gait, decreased activity tolerance, decreased balance, decreased coordination, decreased mobility, difficulty walking, decreased strength, and improper body mechanics.   ACTIVITY LIMITATIONS: carrying, lifting, bending, standing, stairs, transfers, and locomotion level  PARTICIPATION LIMITATIONS: meal prep, cleaning, interpersonal relationship, community activity, and exercise participation  PERSONAL FACTORS: Age, Time since onset of injury/illness/exacerbation, and 1-2 comorbidities: PMH  are also affecting patient's functional outcome.   REHAB POTENTIAL: Good  CLINICAL DECISION MAKING: Evolving/moderate complexity  EVALUATION COMPLEXITY: Moderate  PLAN:  PT FREQUENCY: 1-2x/week, plan is 2x/wk x 3 wks, then 1x/wk x 3 wks  PT DURATION: 6 weeks  PLANNED INTERVENTIONS: 97110-Therapeutic exercises, 97530- Therapeutic activity, 97112- Neuromuscular re-education, 97535- Self Care, 10272- Manual therapy, [phone]- Gait training, [phone]- Orthotic Fit/training, [phone]- Canalith repositioning, [phone]- Aquatic Therapy, Patient/Family education, Balance training, Stair training,  Taping, Joint mobilization, Spinal mobilization, Vestibular training, DME instructions, and Wheelchair mobility training  PLAN FOR NEXT SESSION: review previous HEP and refine   3:38 PM, 09/15/23 M. Shary Decamp, PT, DPT Physical Therapist- [place] Office Number: [phone]

## 2023-09-20 ENCOUNTER — Ambulatory Visit: Payer: Medicare Other

## 2023-09-20 DIAGNOSIS — G10 Huntington's disease: Secondary | ICD-10-CM | POA: Diagnosis not present

## 2023-09-20 DIAGNOSIS — R293 Abnormal posture: Secondary | ICD-10-CM

## 2023-09-20 DIAGNOSIS — R2689 Other abnormalities of gait and mobility: Secondary | ICD-10-CM

## 2023-09-20 DIAGNOSIS — M6281 Muscle weakness (generalized): Secondary | ICD-10-CM

## 2023-09-20 DIAGNOSIS — R2681 Unsteadiness on feet: Secondary | ICD-10-CM

## 2023-09-20 NOTE — Therapy (Signed)
OUTPATIENT PHYSICAL THERAPY NEURO TREATMENT   Patient Name: Barbara Krause MRN: [id number] DOB:Mar 06, 1941, 82 y.o., female Today's Date: 09/20/2023   PCP: Excell Seltzer, MD REFERRING PROVIDER: Excell Seltzer, MD  END OF SESSION:  PT End of Session - 09/20/23 1537     Visit Number 7    Number of Visits 10    Date for PT Re-Evaluation 10/18/23    Authorization Type Medicare/BCBS    Progress Note Due on Visit 10    PT Start Time 1530    PT Stop Time 1615    PT Time Calculation (min) 45 min             Past Medical History:  Diagnosis Date   Breast cancer (HCC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   Depression    History of chicken pox    History of colon polyps    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HCC)    Kidney stone    Osteoporosis    Past Surgical History:  Procedure Laterality Date   CESAREAN SECTION     1977 and 1979   CHOLECYSTECTOMY     TOTAL MASTECTOMY Bilateral 2009   Patient Active Problem List   Diagnosis Date Noted   Administrative encounter 08/02/2023   Weight loss 02/03/2023   Closed fracture of lower end of right radius with routine healing 11/04/2022   Constipation 10/17/2022   History of CVA (cerebrovascular accident) 10/04/2022   Right thyroid nodule 10/02/2022   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 10/02/2022   Acute CVA (cerebrovascular accident) (HCC) 10/01/2022   Coronary atherosclerosis 08/03/2022   Compression fracture of thoracolumbar vertebra with routine healing 08/03/2022   DNR (do not resuscitate) 08/03/2022   At risk for aspiration 08/03/2022   High cholesterol 08/03/2022   Dementia (HCC) 08/03/2022   Acute midline low back pain without sciatica 03/22/2022   Recurrent falls 11/26/2020   Moderate protein-calorie malnutrition (HCC) 06/30/2020   History of intracranial hemorrhage 06/15/2020   Aortic atherosclerosis (HCC) 08/30/2019   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HCC) 08/30/2019   Microscopic hematuria 04/02/2019   Osteoporosis 04/02/2019   Vitamin B12  deficiency 04/02/2019   Thoracic aortic aneurysm (TAA) (HCC) 04/02/2019   HTN (hypertension) 03/05/2019   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   Tobacco use 03/05/2019   Depression, major, single episode, complete remission (HCC) 03/05/2019   Hx of breast cancer 03/05/2019   S/P bilateral mastectomy 03/05/2019    ONSET DATE: 2020  REFERRING DIAG: G10 (ICD-10-CM) - 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HCC)  THERAPY DIAG:  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HCC)  Unsteadiness on feet  Other abnormalities of gait and mobility  Muscle weakness (generalized)  Abnormal posture  Rationale for Evaluation and Treatment: Rehabilitation  SUBJECTIVE:  SUBJECTIVE STATEMENT: Moving to ALF on 09/30/23  Pt accompanied by:  caregiver "[name]"  PERTINENT HISTORY: Huntington's disease, hx of CVA  PAIN:  Are you having pain? No  PRECAUTIONS: Fall  RED FLAGS: None   WEIGHT BEARING RESTRICTIONS: No  FALLS: Has patient fallen in last 6 months? No  LIVING ENVIRONMENT: Lives with: lives alone Lives in: House/apartment Stairs: 3 stairs to enter/exit has BHR Has following equipment at home: Dan Humphreys - 2 wheeled  PLOF: Requires assistive device for independence and Needs assistance with ADLs. Caregiver from noon to 6 PM daily  PATIENT GOALS: improve strength and balance  OBJECTIVE:   TODAY'S TREATMENT: 09/20/23 Activity Comments  NU-step x 8 min resistance level 4 steady-state 60-70 SPM Cues for maintained speed to improve activity tolerance  LAQ 3x10 5#   Hip add/abd iso 3x10 2 sec, cues for max effort  Multisensory balance activities To facilitate righting reactions  Stepping over hurdles 2x4 hurdles w/ RW  Gait eyes closed 2x25 ft w/ RW     TODAY'S TREATMENT: 09/15/23 Activity Comments  NU-step level 5 x 8 min  Steady-state 40-50 SPM  Resisted sit-stand 3x5 Yellow t-band, elevated EOM 23"  Static balance -unsupported standing withstanding postural perturbations 1x10 all directions against yellow t-band resistance  LAQ 3x10 5#  Standing march 2x10 5# BUE support  Dynamic balance -walking on compliant surface: EO w/ RW, HHA, sidestepping, trial of eyes closed.        PATIENT EDUCATION: Education details:  Person educated: Patient and Production assistant, radio method: Explanation Education comprehension: verbalized understanding  HOME EXERCISE PROGRAM: Will refine/update previous HEP  Note: Objective measures were completed at Evaluation unless otherwise noted.  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  BED MOBILITY:  Modified indep  TRANSFERS: Assistive device utilized: Environmental consultant - 2 wheeled  Sit to stand: Modified independence Stand to sit: Modified independence and SBA Chair to chair: Modified independence and SBA Floor:  NT    CURB:  Level of Assistance: CGA Assistive device utilized: Environmental consultant - 2 wheeled Curb Comments:   STAIRS: Level of Assistance: SBA and CGA Stair Negotiation Technique: Step to Pattern with Bilateral Rails Number of Stairs: 6  Height of Stairs: 4-6  Comments:   GAIT: Gait pattern: ataxic and wide BOS Distance walked:  Assistive device utilized: Environmental consultant - 2 wheeled Level of assistance: Modified independence and SBA Comments:   FUNCTIONAL TESTS:  5 times sit to stand: 31 sec, BUE support Timed up and go (TUG): 25 sec w/ RW 10 meter walk test: 25 sec = 1.3  ft/sec Berg Balance Scale: 30/56      GOALS: Goals reviewed with patient? Yes  SHORT TERM GOALS: Target date: 09/20/2023    Patient will perform HEP with family/caregiver supervision for improved strength, balance, transfers, and gait  Baseline: Goal status: IN PROGRESS  2.  Demo improved mobility and reduced risk for falls per time 20 sec TUG test Baseline: 25 sec w/ RW Goal status: IN PROGRESS  3.  Manifest improved BLE strength per time 25 sec 5xSTS to improve transfers/mobility Baseline: 31 sec, BUE support Goal  status: IN PROGRESS    LONG TERM GOALS: Target date: 10/18/23  Demo reduced risk for falls per score 44/56 Berg Balance Test Baseline: 30/56 Goal status: INITIAL  2.  Improve gait speed to 1.8 ft/sec to improve efficiency of ambulation Baseline: 1.3 ft/sec Goal status: INITIAL    ASSESSMENT:  CLINICAL IMPRESSION: Continued with strengthening exercises to improve activity tolerance and LE power for recruitment for transfers with reduced fatigue able to progress sets with minimal rest required.  Multisensory balance activities to facilitate righting reactions and improve single limb support to reduce risk for falls with improving stability with eyes open conditions, but with tendency for lateral LOB after approx 10 sec with eyes closed and compliant surfaces.  Gait with eyes closed to improve body awareness with good self-correction noted deviating approx 12-18" with pathway. Continued sessions to progress POC details to reduce risk for falls and maintain level of mobility  OBJECTIVE IMPAIRMENTS: Abnormal gait, decreased activity tolerance, decreased balance, decreased coordination, decreased mobility, difficulty walking, decreased strength, and improper body mechanics.   ACTIVITY LIMITATIONS: carrying, lifting, bending, standing, stairs, transfers, and locomotion level  PARTICIPATION LIMITATIONS: meal prep, cleaning, interpersonal relationship, community  activity, and exercise participation  PERSONAL FACTORS: Age, Time since onset of injury/illness/exacerbation, and 1-2 comorbidities: PMH  are also affecting patient's functional outcome.   REHAB POTENTIAL: Good  CLINICAL DECISION MAKING: Evolving/moderate complexity  EVALUATION COMPLEXITY: Moderate  PLAN:  PT FREQUENCY: 1-2x/week, plan is 2x/wk x 3 wks, then 1x/wk x 3 wks  PT DURATION: 6 weeks  PLANNED INTERVENTIONS: 97110-Therapeutic exercises, 97530- Therapeutic activity, 97112- Neuromuscular re-education, 97535- Self Care, 40981- Manual therapy, [phone]- Gait training, [phone]- Orthotic Fit/training, [phone]- Canalith repositioning, [phone]- Aquatic Therapy, Patient/Family education, Balance training, Stair training, Taping, Joint mobilization, Spinal mobilization, Vestibular training, DME instructions, and Wheelchair mobility training  PLAN FOR NEXT SESSION: CHECK STG   3:37 PM, 09/20/23 M. Shary Decamp, PT, DPT Physical Therapist- [place] Office Number: [phone]

## 2023-09-22 ENCOUNTER — Ambulatory Visit: Payer: Medicare Other

## 2023-09-22 DIAGNOSIS — M6281 Muscle weakness (generalized): Secondary | ICD-10-CM

## 2023-09-22 DIAGNOSIS — R2681 Unsteadiness on feet: Secondary | ICD-10-CM

## 2023-09-22 DIAGNOSIS — G10 Huntington's disease: Secondary | ICD-10-CM

## 2023-09-22 DIAGNOSIS — R29818 Other symptoms and signs involving the nervous system: Secondary | ICD-10-CM

## 2023-09-22 DIAGNOSIS — R293 Abnormal posture: Secondary | ICD-10-CM

## 2023-09-22 DIAGNOSIS — R2689 Other abnormalities of gait and mobility: Secondary | ICD-10-CM

## 2023-09-22 NOTE — Therapy (Signed)
OUTPATIENT PHYSICAL THERAPY NEURO TREATMENT   Patient Name: Barbara Krause MRN: [id number] DOB:1941-07-02, 82 y.o., female Today's Date: 09/22/2023   PCP: Excell Seltzer, MD REFERRING PROVIDER: Excell Seltzer, MD  END OF SESSION:  PT End of Session - 09/22/23 1537     Visit Number 8    Number of Visits 10    Date for PT Re-Evaluation 10/18/23    Authorization Type Medicare/BCBS    Progress Note Due on Visit 10    PT Start Time 1530    PT Stop Time 1615    PT Time Calculation (min) 45 min             Past Medical History:  Diagnosis Date   Breast cancer (HCC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   Depression    History of chicken pox    History of colon polyps    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HCC)    Kidney stone    Osteoporosis    Past Surgical History:  Procedure Laterality Date   CESAREAN SECTION     1977 and 1979   CHOLECYSTECTOMY     TOTAL MASTECTOMY Bilateral 2009   Patient Active Problem List   Diagnosis Date Noted   Administrative encounter 08/02/2023   Weight loss 02/03/2023   Closed fracture of lower end of right radius with routine healing 11/04/2022   Constipation 10/17/2022   History of CVA (cerebrovascular accident) 10/04/2022   Right thyroid nodule 10/02/2022   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 10/02/2022   Acute CVA (cerebrovascular accident) (HCC) 10/01/2022   Coronary atherosclerosis 08/03/2022   Compression fracture of thoracolumbar vertebra with routine healing 08/03/2022   DNR (do not resuscitate) 08/03/2022   At risk for aspiration 08/03/2022   High cholesterol 08/03/2022   Dementia (HCC) 08/03/2022   Acute midline low back pain without sciatica 03/22/2022   Recurrent falls 11/26/2020   Moderate protein-calorie malnutrition (HCC) 06/30/2020   History of intracranial hemorrhage 06/15/2020   Aortic atherosclerosis (HCC) 08/30/2019   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HCC) 08/30/2019   Microscopic hematuria 04/02/2019   Osteoporosis 04/02/2019   Vitamin B12  deficiency 04/02/2019   Thoracic aortic aneurysm (TAA) (HCC) 04/02/2019   HTN (hypertension) 03/05/2019   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   Tobacco use 03/05/2019   Depression, major, single episode, complete remission (HCC) 03/05/2019   Hx of breast cancer 03/05/2019   S/P bilateral mastectomy 03/05/2019    ONSET DATE: 2020  REFERRING DIAG: G10 (ICD-10-CM) - 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HCC)  THERAPY DIAG:  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HCC)  Unsteadiness on feet  Other abnormalities of gait and mobility  Muscle weakness (generalized)  Abnormal posture  Other symptoms and signs involving the nervous system  Rationale for Evaluation and Treatment: Rehabilitation  SUBJECTIVE:  SUBJECTIVE STATEMENT: Doing ok, no new issues.  Pt accompanied by:  caregiver "[name]"  PERTINENT HISTORY: Huntington's disease, hx of CVA  PAIN:  Are you having pain? No  PRECAUTIONS: Fall  RED FLAGS: None   WEIGHT BEARING RESTRICTIONS: No  FALLS: Has patient fallen in last 6 months? No  LIVING ENVIRONMENT: Lives with: lives alone Lives in: House/apartment Stairs: 3 stairs to enter/exit has BHR Has following equipment at home: Dan Humphreys - 2 wheeled  PLOF: Requires assistive device for independence and Needs assistance with ADLs. Caregiver from noon to 6 PM daily  PATIENT GOALS: improve strength and balance  OBJECTIVE:   TODAY'S TREATMENT: 09/22/23 Activity Comments  NU-step level 5 x 8 min To improve activity tolerance, cues for maintaining SPM 40-50   LAQ 3x10 5#  Hip add/abd iso 3x10   Standing hip flexion 3x10 5#, visual target for step height  Multisensory balance   Sidestepping x 1.5 min Onset of fatigue     TODAY'S TREATMENT: 09/20/23 Activity Comments  NU-step x 8 min resistance level 4  steady-state 60-70 SPM Cues for maintained speed to improve activity tolerance  LAQ 3x10 5#   Hip add/abd iso 3x10 2 sec, cues for max effort  Multisensory balance activities To facilitate righting reactions  Stepping over hurdles 2x4 hurdles w/ RW  Gait eyes closed 2x25 ft w/ RW           PATIENT EDUCATION: Education details:  Person educated: Patient and Statistician Education method: Explanation Education comprehension: verbalized understanding  HOME EXERCISE PROGRAM: Will refine/update previous HEP  Note: Objective measures were completed at Evaluation unless otherwise noted.  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  BED MOBILITY:  Modified indep  TRANSFERS: Assistive device utilized: Environmental consultant - 2 wheeled  Sit to stand: Modified independence Stand to sit: Modified independence and SBA Chair to chair: Modified independence and SBA Floor:  NT    CURB:  Level of Assistance: CGA Assistive device utilized: Environmental consultant - 2 wheeled Curb Comments:   STAIRS: Level of Assistance: SBA and CGA Stair Negotiation Technique: Step to Pattern with Bilateral Rails Number of Stairs: 6  Height of Stairs: 4-6  Comments:   GAIT: Gait pattern: ataxic and wide BOS Distance walked:  Assistive device utilized: Environmental consultant - 2 wheeled Level of assistance: Modified independence and SBA Comments:   FUNCTIONAL TESTS:  5 times sit to stand: 31 sec, BUE support Timed up and go (TUG): 25 sec w/ RW 10 meter walk test: 25 sec = 1.3 ft/sec Berg Balance Scale: 30/56      GOALS: Goals reviewed  with patient? Yes  SHORT TERM GOALS: Target date: 09/20/2023    Patient will perform HEP with family/caregiver supervision for improved strength, balance, transfers, and gait  Baseline: Goal status: IN PROGRESS  2.  Demo improved mobility and reduced risk for falls per time 20 sec TUG test Baseline: 25 sec w/ RW Goal status: IN PROGRESS  3.  Manifest improved BLE strength per time 25 sec 5xSTS to improve transfers/mobility Baseline: 31 sec, BUE support Goal status: IN PROGRESS    LONG TERM  GOALS: Target date: 10/18/23  Demo reduced risk for falls per score 44/56 Berg Balance Test Baseline: 30/56 Goal status: INITIAL  2.  Improve gait speed to 1.8 ft/sec to improve efficiency of ambulation Baseline: 1.3 ft/sec Goal status: INITIAL    ASSESSMENT:  CLINICAL IMPRESSION: Continued with strengthening exercises to improve activity tolerance and LE power for recruitment for transfers with reduced fatigue able to progress sets with minimal rest required.  Cues for sequence with tapered feedback needed. Progressed multisensory balance activities to impose more single limb stance bias to facilitate righting reactions. Greater imbalance with RLE bias vs left.  Will perform D/C assessment at next session as pt is moving to facility out of town.   OBJECTIVE IMPAIRMENTS: Abnormal gait, decreased activity tolerance, decreased balance, decreased coordination, decreased mobility, difficulty walking, decreased strength, and improper body mechanics.   ACTIVITY LIMITATIONS: carrying, lifting, bending, standing, stairs, transfers, and locomotion level  PARTICIPATION LIMITATIONS: meal prep, cleaning, interpersonal relationship, community activity, and exercise participation  PERSONAL FACTORS: Age, Time since onset of injury/illness/exacerbation, and 1-2 comorbidities: PMH  are also affecting patient's functional outcome.   REHAB POTENTIAL: Good  CLINICAL DECISION MAKING: Evolving/moderate  complexity  EVALUATION COMPLEXITY: Moderate  PLAN:  PT FREQUENCY: 1-2x/week, plan is 2x/wk x 3 wks, then 1x/wk x 3 wks  PT DURATION: 6 weeks  PLANNED INTERVENTIONS: 97110-Therapeutic exercises, 97530- Therapeutic activity, 97112- Neuromuscular re-education, 97535- Self Care, 16109- Manual therapy, [phone]- Gait training, [phone]- Orthotic Fit/training, [phone]- Canalith repositioning, [phone]- Aquatic Therapy, Patient/Family education, Balance training, Stair training, Taping, Joint mobilization, Spinal mobilization, Vestibular training, DME instructions, and Wheelchair mobility training  PLAN FOR NEXT SESSION: D/C assessment   3:37 PM, 09/22/23 M. Shary Decamp, PT, DPT Physical Therapist- [place] Office Number: [phone]

## 2023-09-27 ENCOUNTER — Ambulatory Visit: Payer: Medicare Other

## 2023-09-27 DIAGNOSIS — G10 Huntington's disease: Secondary | ICD-10-CM

## 2023-09-27 DIAGNOSIS — M6281 Muscle weakness (generalized): Secondary | ICD-10-CM

## 2023-09-27 DIAGNOSIS — R2689 Other abnormalities of gait and mobility: Secondary | ICD-10-CM

## 2023-09-27 DIAGNOSIS — R293 Abnormal posture: Secondary | ICD-10-CM

## 2023-09-27 DIAGNOSIS — R2681 Unsteadiness on feet: Secondary | ICD-10-CM

## 2023-09-27 NOTE — Therapy (Signed)
OUTPATIENT PHYSICAL THERAPY NEURO TREATMENT and D/C Summary   Patient Name: Barbara Krause MRN: [id number] DOB:11-Sep-1941, 82 y.o., female Today's Date: 09/27/2023   PCP: Excell Seltzer, MD REFERRING PROVIDER: Excell Seltzer, MD  PHYSICAL THERAPY DISCHARGE SUMMARY  Visits from Start of Care: 9  Current functional level related to goals / functional outcomes: Pt progressed with balance measures. Minimal change to ambulation speed/TUG test fall risk, see below for results   Remaining deficits: Balance, motor control, gait deviation   Education / Equipment: HEP   Patient agrees to discharge. Patient goals were partially met. Patient is being discharged due to  pt moving to different city.  END OF SESSION:  PT End of Session - 09/27/23 1532     Visit Number 9    Number of Visits 10    Date for PT Re-Evaluation 10/18/23    Authorization Type Medicare/BCBS    Progress Note Due on Visit 10    PT Start Time 1532    PT Stop Time 1615    PT Time Calculation (min) 43 min             Past Medical History:  Diagnosis Date   Breast cancer (HCC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   Depression    History of chicken pox    History of colon polyps    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HCC)    Kidney stone    Osteoporosis    Past Surgical History:  Procedure Laterality Date   CESAREAN SECTION     1977 and 1979   CHOLECYSTECTOMY     TOTAL MASTECTOMY Bilateral 2009   Patient Active Problem List   Diagnosis Date Noted   Administrative encounter 08/02/2023   Weight loss 02/03/2023   Closed fracture of lower end of right radius with routine healing 11/04/2022   Constipation 10/17/2022   History of CVA (cerebrovascular accident) 10/04/2022   Right thyroid nodule 10/02/2022   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 10/02/2022   Acute CVA (cerebrovascular accident) (HCC) 10/01/2022   Coronary atherosclerosis 08/03/2022   Compression fracture of thoracolumbar vertebra with routine healing  08/03/2022   DNR (do not resuscitate) 08/03/2022   At risk for aspiration 08/03/2022   High cholesterol 08/03/2022   Dementia (HCC) 08/03/2022   Acute midline low back pain without sciatica 03/22/2022   Recurrent falls 11/26/2020   Moderate protein-calorie malnutrition (HCC) 06/30/2020   History of intracranial hemorrhage 06/15/2020   Aortic atherosclerosis (HCC) 08/30/2019   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HCC) 08/30/2019   Microscopic hematuria 04/02/2019   Osteoporosis 04/02/2019   Vitamin B12 deficiency 04/02/2019   Thoracic aortic aneurysm (TAA) (HCC) 04/02/2019   HTN (hypertension) 03/05/2019   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 Preparing for the move  Pt accompanied by:  caregiver "[name]"  PERTINENT HISTORY: Huntington's disease, hx of CVA  PAIN:  Are you having pain? No  PRECAUTIONS: Fall  RED FLAGS: None   WEIGHT BEARING RESTRICTIONS: No  FALLS: Has patient fallen in last 6 months? No  LIVING ENVIRONMENT: Lives with: lives alone Lives in: House/apartment Stairs: 3 stairs to enter/exit has BHR Has following equipment at home: Dan Humphreys - 2 wheeled  PLOF: Requires assistive device for independence and Needs assistance with ADLs. Caregiver from noon to 6 PM  daily  PATIENT GOALS: improve strength and balance  OBJECTIVE:   TODAY'S TREATMENT: 09/27/23 Activity Comments  NU-step level 4 x 6 min For rapid alternating movement  5xSTS test 18 sec  TUG test 27 sec w/ RW  Berg Balance Test 44/56   Gait speed  0.9 ft/sec  LE PRE      TODAY'S TREATMENT: 09/22/23 Activity Comments  NU-step level 5 x 8 min To improve activity tolerance, cues for maintaining SPM 40-50   LAQ 3x10 5#  Hip add/abd iso 3x10   Standing hip flexion 3x10 5#, visual target for step height  Multisensory balance   Sidestepping x 1.5 min Onset of fatigue      PATIENT EDUCATION: Education details:  Person educated: Patient and Statistician Education method: Explanation Education comprehension: verbalized understanding  HOME EXERCISE PROGRAM: Will refine/update previous HEP  Note: Objective measures were completed at Evaluation unless otherwise noted.  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  BED MOBILITY:  Modified indep  TRANSFERS: Assistive device utilized: Environmental consultant - 2 wheeled  Sit to stand: Modified independence Stand to sit: Modified independence and SBA Chair to chair: Modified independence and SBA Floor:  NT    CURB:  Level of Assistance: CGA Assistive device utilized: Environmental consultant - 2 wheeled Curb Comments:   STAIRS: Level of Assistance: SBA and CGA Stair Negotiation Technique: Step to Pattern with Bilateral Rails Number of Stairs: 6  Height of Stairs: 4-6  Comments:   GAIT: Gait pattern: ataxic and wide BOS Distance walked:  Assistive device  utilized: Environmental consultant - 2 wheeled Level of assistance: Modified independence and SBA Comments:   FUNCTIONAL TESTS:  5 times sit to stand: 31 sec, BUE support Timed up and go (TUG): 25 sec w/ RW 10 meter walk test: 25 sec = 1.3 ft/sec Berg Balance Scale: 30/56      GOALS: Goals reviewed with patient? Yes  SHORT TERM GOALS: Target date: 09/20/2023    Patient will perform HEP with family/caregiver supervision for improved strength, balance, transfers, and gait  Baseline: Goal status: MET  2.  Demo improved mobility and reduced risk for falls per time 20 sec TUG test Baseline: 25 sec w/ RW; (09/27/23) 27 sec Goal status: NOT MET  3.  Manifest improved BLE strength per time 25 sec 5xSTS to improve transfers/mobility Baseline: 31 sec, BUE support; (09/27/23) 18 sec w/ BUE push-off Goal status: MET    LONG TERM GOALS: Target date: 10/18/23  Demo reduced risk for falls per score 44/56 Sharlene Motts  Balance Test Baseline: 30/56; (09/27/23) 44/56 Goal status: MET  2.  Improve gait speed to 1.8 ft/sec to improve efficiency of ambulation Baseline: 1.3 ft/sec; (09/27/23) 0.9 ft/sec Goal status: NOT MET    ASSESSMENT:  CLINICAL IMPRESSION: Initiated with NU-step for rapid alternating movements to prepare for activity. Performance and review of POC objectives with improved ability for 5xSTS test demonstrating improved eccentric and concentric contol and reduced time as well as improved to previous Berg Balance Test score from previous POC.  Walking speed continues to be variable but overall with improved postural control noted with decrease in LE hemiballism movements.  Pt will D/C at this time as she is moving to different location. Recommend that she pursue further PT sessions to reduce risk of functional decline, verbalizes understanding  OBJECTIVE IMPAIRMENTS: Abnormal gait, decreased activity tolerance, decreased balance, decreased coordination, decreased mobility, difficulty walking,  decreased strength, and improper body mechanics.   ACTIVITY LIMITATIONS: carrying, lifting, bending, standing, stairs, transfers, and locomotion level  PARTICIPATION LIMITATIONS: meal prep, cleaning, interpersonal relationship, community activity, and exercise participation  PERSONAL FACTORS: Age, Time since onset of injury/illness/exacerbation, and 1-2 comorbidities: PMH  are also affecting patient's functional outcome.   REHAB POTENTIAL: Good  CLINICAL DECISION MAKING: Evolving/moderate complexity  EVALUATION COMPLEXITY: Moderate  PLAN:  PT FREQUENCY: 1-2x/week, plan is 2x/wk x 3 wks, then 1x/wk x 3 wks  PT DURATION: 6 weeks  PLANNED INTERVENTIONS: 97110-Therapeutic exercises, 97530- Therapeutic activity, 97112- Neuromuscular re-education, 97535- Self Care, 91478- Manual therapy, [phone]- Gait training, [phone]- Orthotic Fit/training, [phone]- Canalith repositioning, [phone]- Aquatic Therapy, Patient/Family education, Balance training, Stair training, Taping, Joint mobilization, Spinal mobilization, Vestibular training, DME instructions, and Wheelchair mobility training  PLAN FOR NEXT SESSION: D/C assessment   3:33 PM, 09/27/23 M. Shary Decamp, PT, DPT Physical Therapist- [place] Office Number: [phone]

## 2023-10-04 ENCOUNTER — Ambulatory Visit: Payer: Medicare Other

## 2023-10-11 ENCOUNTER — Ambulatory Visit: Payer: Medicare Other

## 2023-12-01 ENCOUNTER — Ambulatory Visit: Payer: Medicare Other | Admitting: Physical Therapy

## 2024-01-17 ENCOUNTER — Telehealth: Payer: Self-pay | Admitting: Family Medicine

## 2024-01-17 NOTE — Telephone Encounter (Signed)
 Contacted Barbara Krause to schedule their annual wellness visit. Patient declined to schedule AWV at this time. Transferred care to a skilled nursing facility.  [facility] Care Guide [facility] AWV TEAM Direct Dial: [phone]

## 2024-01-26 ENCOUNTER — Other Ambulatory Visit (HOSPITAL_COMMUNITY): Payer: Self-pay

## 2024-01-26 IMAGING — DX DG LUMBAR SPINE COMPLETE 4+V
5 series · 5 of 5 positions shown · non-contrast
Comparison: CT AP 03/23/21.

CLINICAL DATA: Fall.  Back pain

EXAM:
LUMBAR SPINE - COMPLETE 4+ VIEW

[lumbar spine ap]
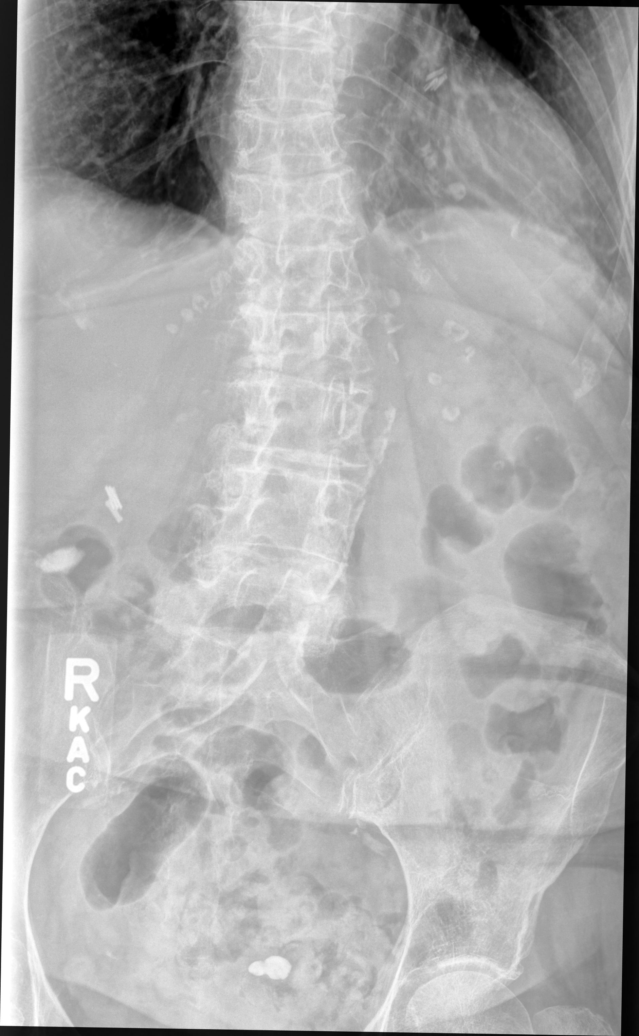

[lumbar spine mlo (1 of 2)]
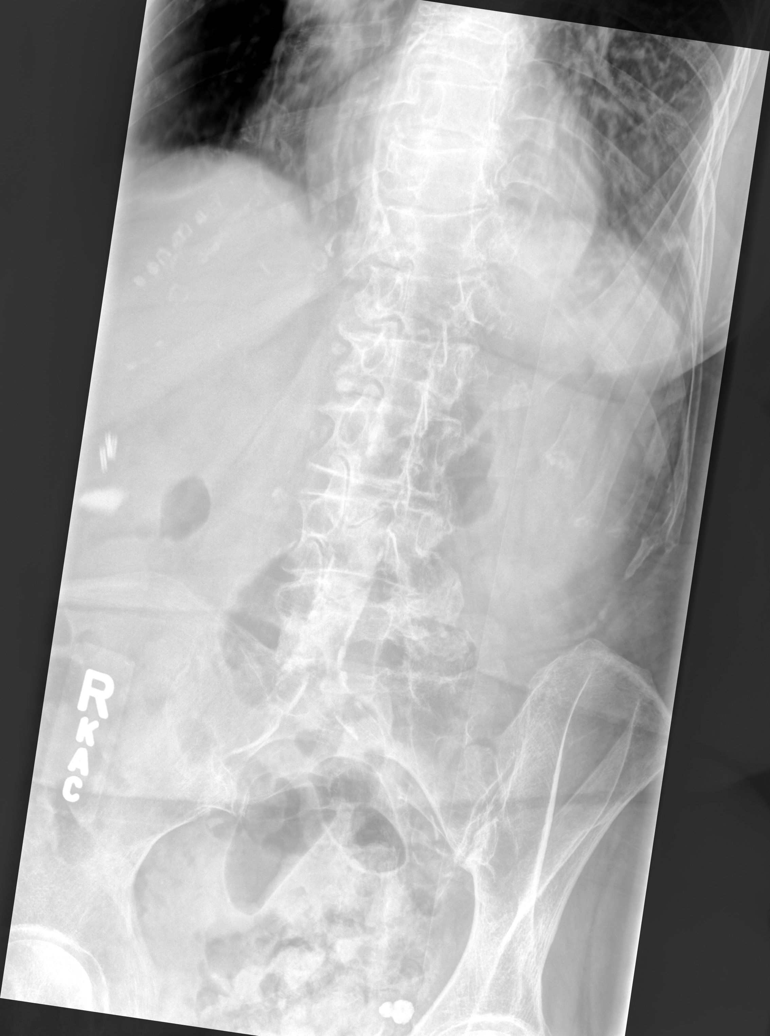

[lumbar spine mlo (2 of 2)]
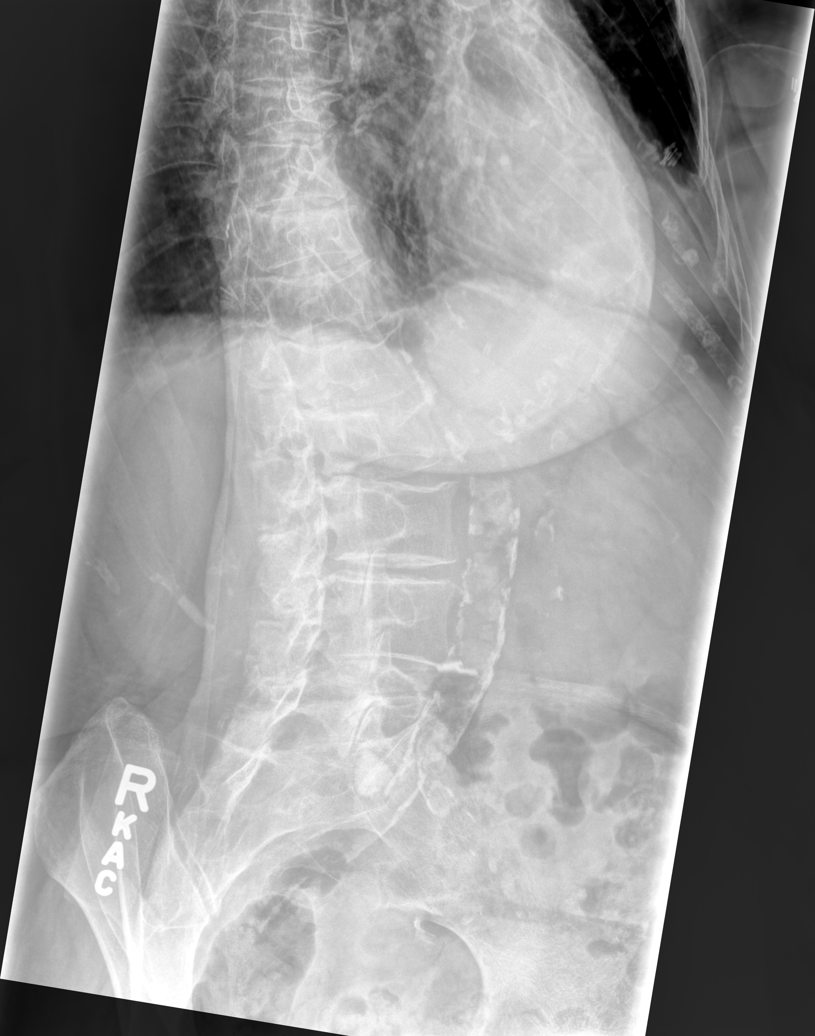

[lumbar spine lat (1 of 2)]
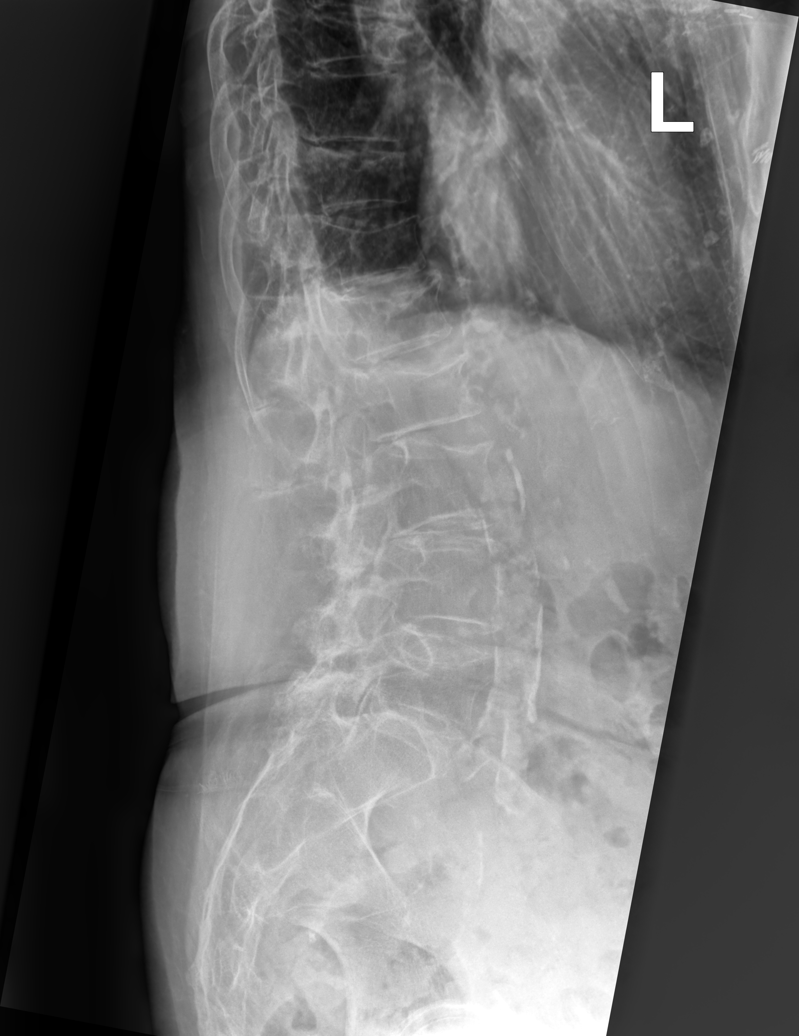

[lumbar spine lat (2 of 2)]
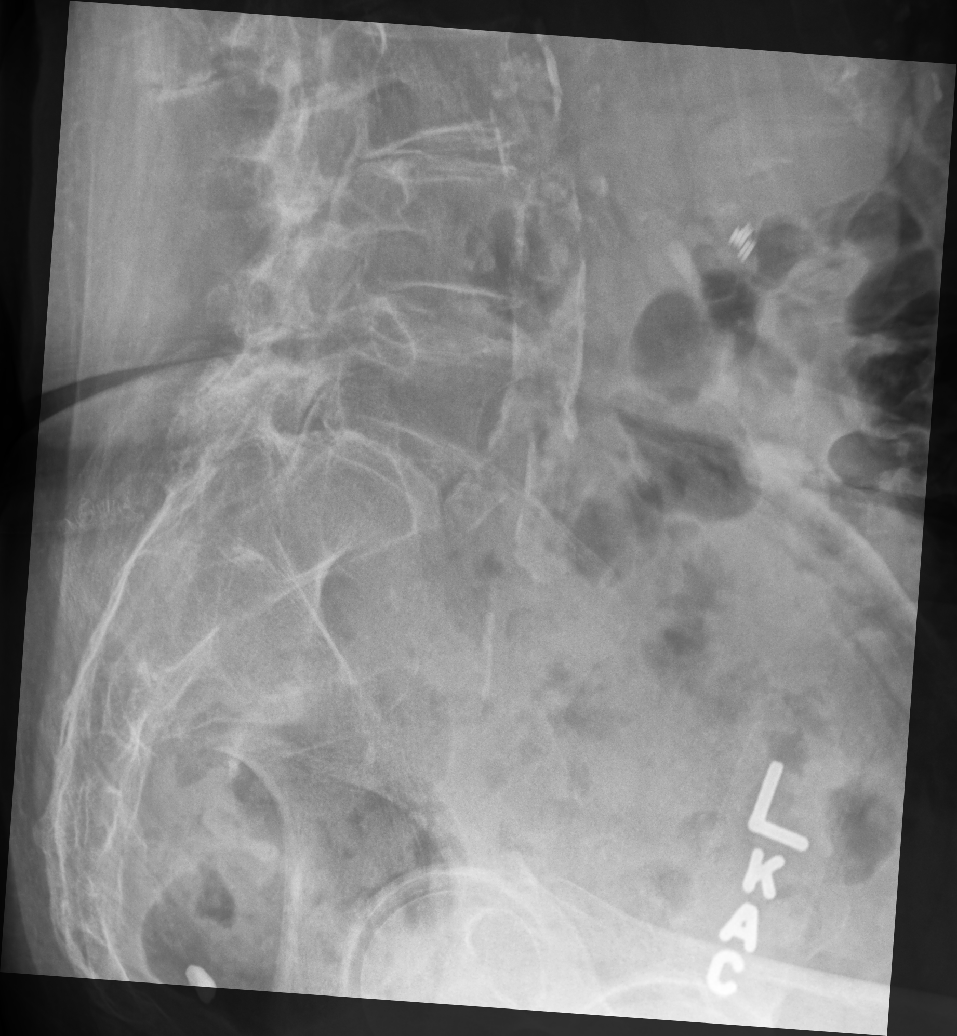

[5 of 5 positions shown; findings below may reference images not displayed]

FINDINGS: The bones appear diffusely osteopenic. There is a curvature of the
lumbar spine which appears convex towards the left. Mild, superior
endplate chronic compression deformities are identified involving
T11, T12, L1, and L2 vertebral bodies. These are unchanged when
compared with 02/18/2022. No new compression fractures identified.
Aortic atherosclerosis.
IMPRESSION: 1. No acute findings.
2. Multiple compression deformities are identified the within the
lumbar spine and lower thoracic spine which appear unchanged when
compared with CT of the chest from 02/18/2022.
3.  Aortic Atherosclerosis ([id number]).

.

## 2024-02-25 IMAGING — CR DG ABDOMEN 1V
1 series · 2 of 2 positions shown · non-contrast
Comparison: 03/23/2021

CLINICAL DATA: Follow-up kidney stones

EXAM:
ABDOMEN - 1 VIEW

[Series 1: dg abd 1 view · 0.14mm/px · 2 of 2 slices shown]
[im 1/2]
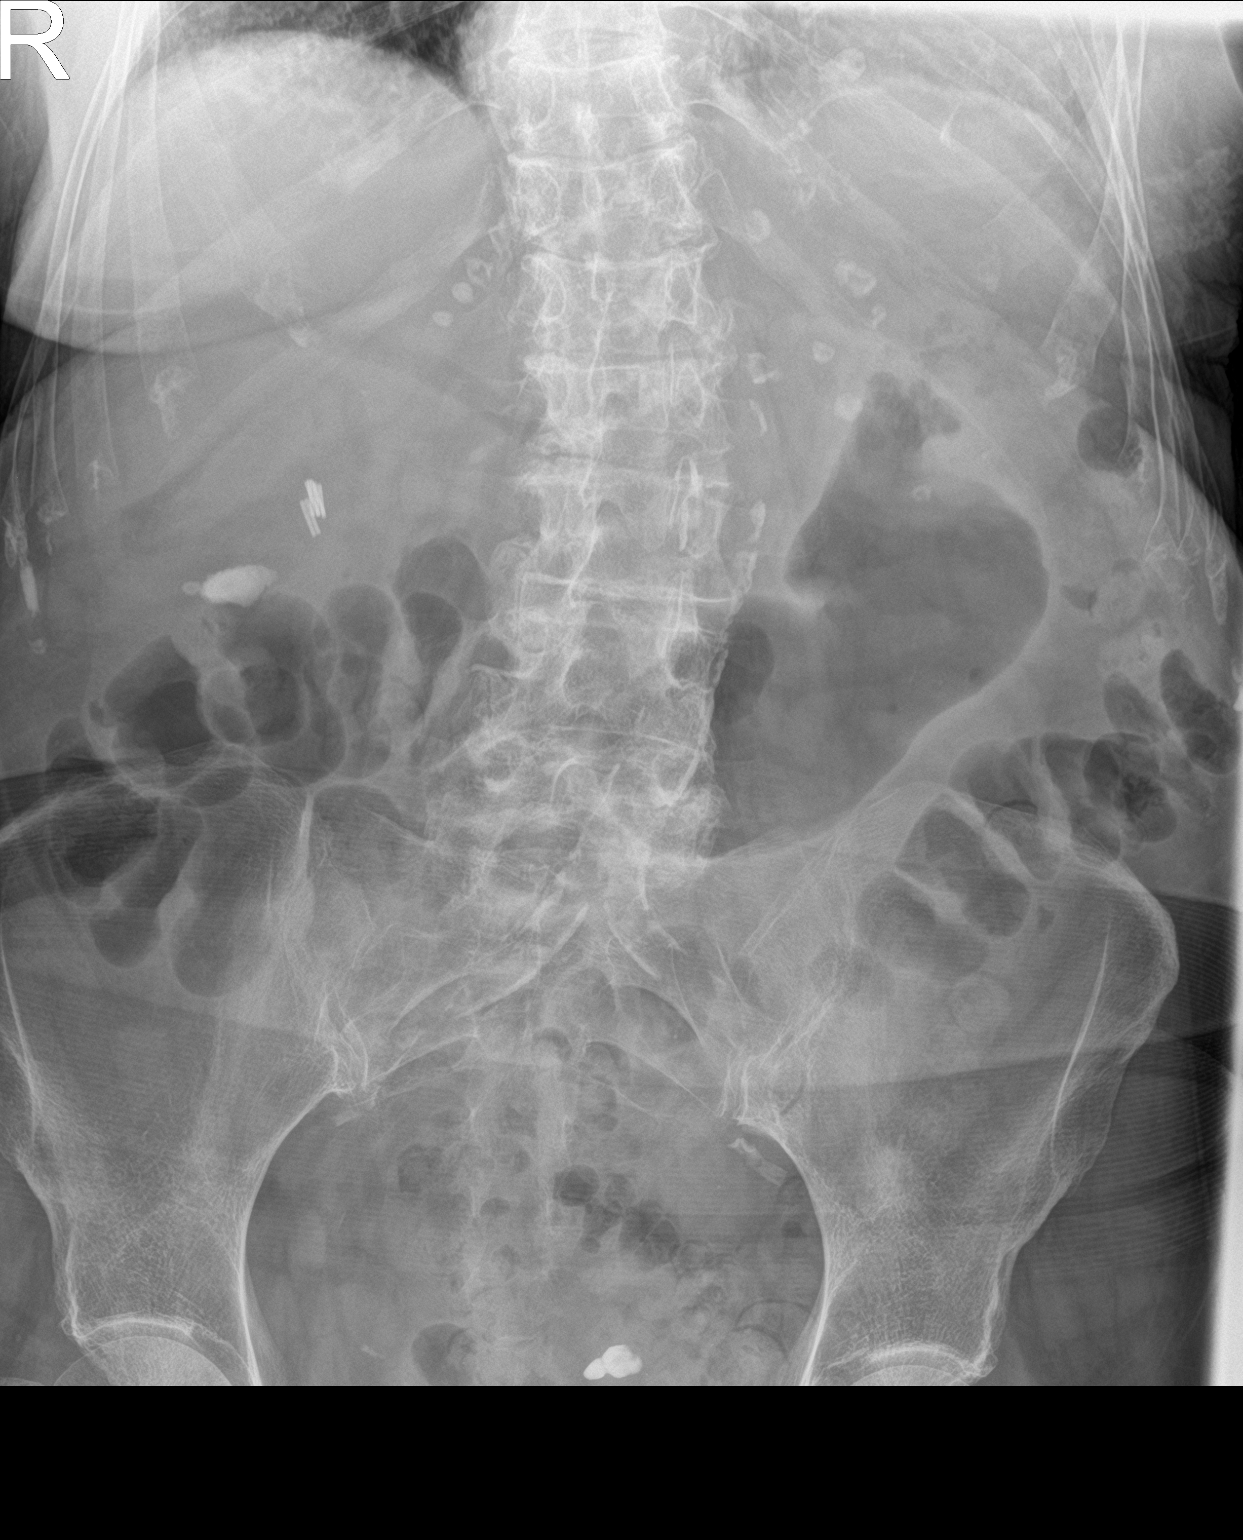
[im 2/2]
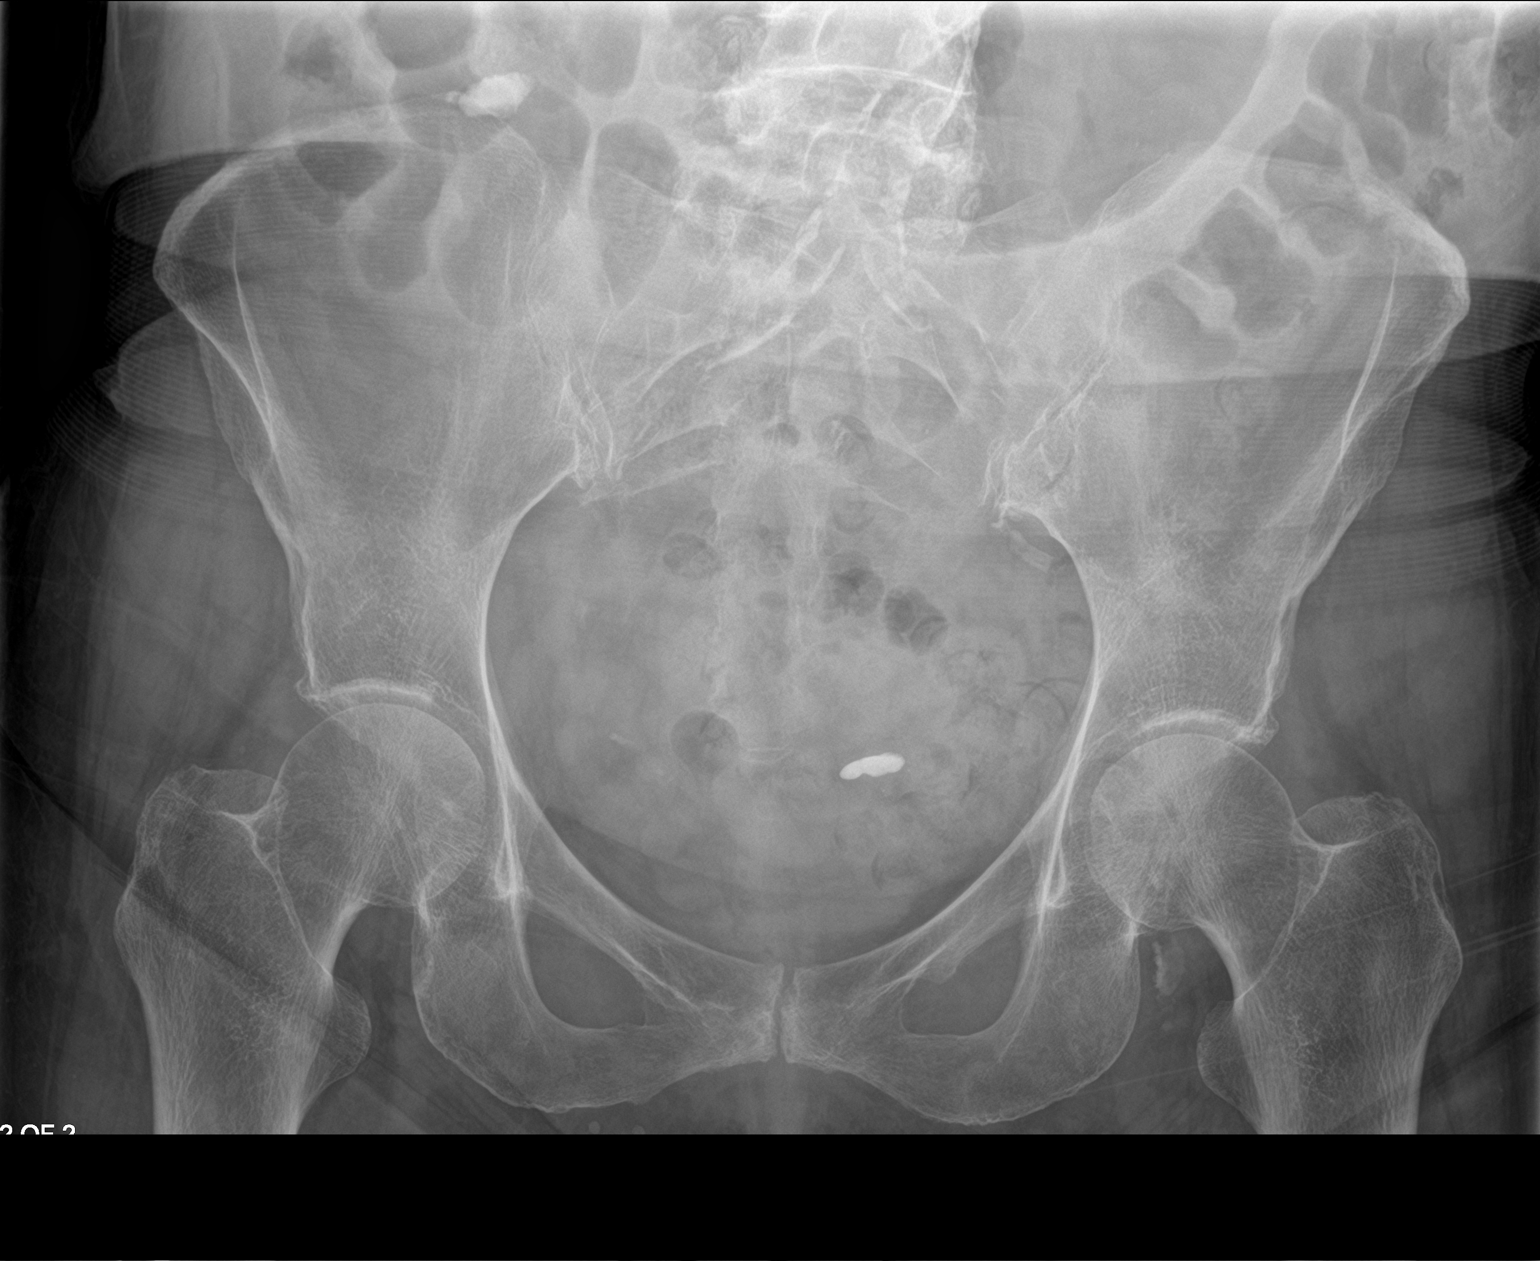

[2 of 2 positions shown; findings below may reference images not displayed]

FINDINGS: Scattered large and small bowel gas is noted. Renal calculi are
noted on the right. The largest of these measures up to 2.2 cm.
Pelvic calcifications are again identified extrinsic to the bowel
and urinary system on previous CT. Aortic calcifications are noted.
No free air is noted.
IMPRESSION: Stable right renal calculus.

Calcifications in the pelvis are shown to be extrinsic to the bowel
and urinary tract system.

## 2024-03-01 DEATH — deceased
